# Patient Record
Sex: Male | Born: 1950
Health system: Southern US, Community
[De-identification: ages and names within clinical notes are randomized; demographics above are authoritative.]

## PROBLEM LIST (undated history)

## (undated) DIAGNOSIS — N529 Male erectile dysfunction, unspecified: Secondary | ICD-10-CM

## (undated) DIAGNOSIS — E119 Type 2 diabetes mellitus without complications: Secondary | ICD-10-CM

## (undated) DIAGNOSIS — K8020 Calculus of gallbladder without cholecystitis without obstruction: Secondary | ICD-10-CM

## (undated) DIAGNOSIS — I10 Essential (primary) hypertension: Secondary | ICD-10-CM

## (undated) DIAGNOSIS — K759 Inflammatory liver disease, unspecified: Secondary | ICD-10-CM

## (undated) DIAGNOSIS — E785 Hyperlipidemia, unspecified: Secondary | ICD-10-CM

## (undated) DIAGNOSIS — Z8719 Personal history of other diseases of the digestive system: Secondary | ICD-10-CM

## (undated) DIAGNOSIS — M4800 Spinal stenosis, site unspecified: Secondary | ICD-10-CM

## (undated) HISTORY — DX: Essential (primary) hypertension: I10

## (undated) HISTORY — DX: Type 2 diabetes mellitus without complications: E11.9

## (undated) HISTORY — DX: Male erectile dysfunction, unspecified: N52.9

## (undated) HISTORY — DX: Calculus of gallbladder without cholecystitis without obstruction: K80.20

## (undated) HISTORY — DX: Hyperlipidemia, unspecified: E78.5

---

## 1988-08-19 HISTORY — PX: OTHER SURGICAL HISTORY: SHX169

## 1998-11-04 ENCOUNTER — Emergency Department (HOSPITAL_COMMUNITY): Admission: EM | Admit: 1998-11-04 | Discharge: 1998-11-04 | Payer: Self-pay | Admitting: Emergency Medicine

## 1999-12-06 ENCOUNTER — Emergency Department (HOSPITAL_COMMUNITY): Admission: EM | Admit: 1999-12-06 | Discharge: 1999-12-06 | Payer: Self-pay | Admitting: Emergency Medicine

## 1999-12-06 ENCOUNTER — Encounter: Payer: Self-pay | Admitting: Emergency Medicine

## 2001-10-22 ENCOUNTER — Emergency Department (HOSPITAL_COMMUNITY): Admission: EM | Admit: 2001-10-22 | Discharge: 2001-10-22 | Payer: Self-pay | Admitting: Emergency Medicine

## 2002-02-24 ENCOUNTER — Emergency Department (HOSPITAL_COMMUNITY): Admission: EM | Admit: 2002-02-24 | Discharge: 2002-02-24 | Payer: Self-pay

## 2002-03-02 ENCOUNTER — Encounter: Admission: RE | Admit: 2002-03-02 | Discharge: 2002-03-02 | Payer: Self-pay | Admitting: Internal Medicine

## 2003-04-14 ENCOUNTER — Emergency Department (HOSPITAL_COMMUNITY): Admission: EM | Admit: 2003-04-14 | Discharge: 2003-04-14 | Payer: Self-pay | Admitting: Emergency Medicine

## 2003-04-24 ENCOUNTER — Emergency Department (HOSPITAL_COMMUNITY): Admission: EM | Admit: 2003-04-24 | Discharge: 2003-04-24 | Payer: Self-pay | Admitting: Emergency Medicine

## 2011-10-30 ENCOUNTER — Ambulatory Visit (INDEPENDENT_AMBULATORY_CARE_PROVIDER_SITE_OTHER): Payer: Self-pay | Admitting: Internal Medicine

## 2011-10-30 ENCOUNTER — Encounter: Payer: Self-pay | Admitting: Internal Medicine

## 2011-10-30 VITALS — BP 142/84 | HR 80 | Temp 97.3°F | Ht 72.0 in | Wt 232.3 lb

## 2011-10-30 DIAGNOSIS — E1149 Type 2 diabetes mellitus with other diabetic neurological complication: Secondary | ICD-10-CM

## 2011-10-30 DIAGNOSIS — E109 Type 1 diabetes mellitus without complications: Secondary | ICD-10-CM | POA: Insufficient documentation

## 2011-10-30 DIAGNOSIS — N529 Male erectile dysfunction, unspecified: Secondary | ICD-10-CM

## 2011-10-30 DIAGNOSIS — F1911 Other psychoactive substance abuse, in remission: Secondary | ICD-10-CM

## 2011-10-30 DIAGNOSIS — E1142 Type 2 diabetes mellitus with diabetic polyneuropathy: Secondary | ICD-10-CM | POA: Insufficient documentation

## 2011-10-30 DIAGNOSIS — F1111 Opioid abuse, in remission: Secondary | ICD-10-CM | POA: Insufficient documentation

## 2011-10-30 DIAGNOSIS — E785 Hyperlipidemia, unspecified: Secondary | ICD-10-CM | POA: Insufficient documentation

## 2011-10-30 DIAGNOSIS — E1140 Type 2 diabetes mellitus with diabetic neuropathy, unspecified: Secondary | ICD-10-CM

## 2011-10-30 DIAGNOSIS — I10 Essential (primary) hypertension: Secondary | ICD-10-CM | POA: Insufficient documentation

## 2011-10-30 LAB — LIPID PANEL
Cholesterol: 206 mg/dL — ABNORMAL HIGH (ref 0–200)
HDL: 56 mg/dL (ref >39)
LDL Cholesterol: 135 mg/dL — ABNORMAL HIGH (ref 0–99)
Total CHOL/HDL Ratio: 3.7 Ratio
Triglycerides: 75 mg/dL (ref <150)
VLDL: 15 mg/dL (ref 0–40)

## 2011-10-30 LAB — COMPREHENSIVE METABOLIC PANEL
ALT: 19 U/L (ref 0–53)
AST: 28 U/L (ref 0–37)
Albumin: 4.4 g/dL (ref 3.5–5.2)
Alkaline Phosphatase: 80 U/L (ref 39–117)
BUN: 16 mg/dL (ref 6–23)
CO2: 25 mEq/L (ref 19–32)
Calcium: 10.2 mg/dL (ref 8.4–10.5)
Chloride: 103 mEq/L (ref 96–112)
Creat: 0.96 mg/dL (ref 0.50–1.35)
Glucose, Bld: 72 mg/dL (ref 70–99)
Potassium: 4.6 mEq/L (ref 3.5–5.3)
Sodium: 141 mEq/L (ref 135–145)
Total Bilirubin: 0.6 mg/dL (ref 0.3–1.2)
Total Protein: 8.1 g/dL (ref 6.0–8.3)

## 2011-10-30 MED ORDER — AMITRIPTYLINE HCL 25 MG PO TABS: 25.0000 mg | ORAL_TABLET | Freq: Every day | ORAL | Status: DC

## 2011-10-30 MED ORDER — INSULIN ASPART PROT & ASPART (70-30 MIX) 100 UNIT/ML ~~LOC~~ SUSP: 42.0000 [IU] | Freq: Two times a day (BID) | SUBCUTANEOUS | Status: DC

## 2011-10-30 MED ORDER — PRAVASTATIN SODIUM 40 MG PO TABS: 40.0000 mg | ORAL_TABLET | Freq: Every evening | ORAL | Status: DC

## 2011-10-30 MED ORDER — ENALAPRIL MALEATE 2.5 MG PO TABS: 2.5000 mg | ORAL_TABLET | Freq: Every day | ORAL | Status: DC

## 2011-10-30 MED ORDER — SILDENAFIL CITRATE 100 MG PO TABS: 100.0000 mg | ORAL_TABLET | ORAL | Status: DC | PRN

## 2011-10-30 NOTE — Patient Instructions (Addendum)
your prescriptions has been sent to your pharmacy [facility] on [address]   Diabetes and Foot Care Diabetes may cause you to have a poor blood supply (circulation) to your legs and feet. Because of this, the skin may be thinner, break easier, and heal more slowly. You also may have nerve damage in your legs and feet causing decreased feeling. You may not notice minor injuries to your feet that could lead to serious problems or infections. Taking care of your feet is one of the most important things you can do for yourself.  HOME CARE INSTRUCTIONS  Do not go barefoot. Bare feet are easily injured.   Check your feet daily for blisters, cuts, and redness.   Wash your feet with warm water (not hot) and mild soap. Pat your feet and between your toes until completely dry.   Apply a moisturizing lotion that does not contain alcohol or petroleum jelly to the dry skin on your feet and to dry brittle toenails. Do not put it between your toes.   Trim your toenails straight across. Do not dig under them or around the cuticle.   Do not cut corns or calluses, or try to remove them with medicine.   Wear clean cotton socks or stockings every day. Make sure they are not too tight. Do not wear knee high stockings since they may decrease blood flow to your legs.   Wear leather shoes that fit properly and have enough cushioning. To break in new shoes, wear them just a few hours a day to avoid injuring your feet.   Wear shoes at all times, even in the house.   Do not cross your legs. This may decrease the blood flow to your feet.   If you find a minor scrape, cut, or break in the skin on your feet, keep it and the skin around it clean and dry. These areas may be cleansed with mild soap and water. Do not use peroxide, alcohol, iodine or Merthiolate.   When you remove an adhesive bandage, be sure not to harm the skin around it.   If you have a wound, look at it several times a day to make sure it is healing.     Do not use heating pads or hot water bottles. Burns can occur. If you have lost feeling in your feet or legs, you may not know it is happening until it is too late.   Report any cuts, sores or bruises to your caregiver. Do not wait!  SEEK MEDICAL CARE IF:   You have an injury that is not healing or you notice redness, numbness, burning, or tingling.   Your feet always feel cold.   You have pain or cramps in your legs and feet.  SEEK IMMEDIATE MEDICAL CARE IF:   There is increasing redness, swelling, or increasing pain in the wound.   There is a red line that goes up your leg.   Pus is coming from a wound.   You develop an unexplained oral temperature above 102 F (38.9 C), or as your caregiver suggests.   You notice a bad smell coming from an ulcer or wound.  MAKE SURE YOU:   Understand these instructions.   Will watch your condition.   Will get help right away if you are not doing well or get worse.  Document Released: 08/02/2000 Document Revised: 07/25/2011 Document Reviewed: 02/08/2009 [facility] Patient Information 2012 [place], [place].

## 2011-10-30 NOTE — Assessment & Plan Note (Signed)
Give the patient trial of Viagra

## 2011-10-30 NOTE — Assessment & Plan Note (Signed)
Check fasting lipid today, and given a prescription for pravastatin 40 mg.

## 2011-10-30 NOTE — Assessment & Plan Note (Signed)
Prescribe amitriptyline for diabetic neuropathy

## 2011-10-30 NOTE — Assessment & Plan Note (Signed)
Within reasonable control, continue enalapril 2.5 mg daily

## 2011-10-30 NOTE — Progress Notes (Signed)
Subjective:     Patient ID: Timothy Peters, male   DOB: August 31, 1950, 60 y.o.   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 Patient has been clean since that time and reports that he's changed his life since, patient currently denies any alcohol drugs or tobacco use. Reports compliance with his medications however he's running short like refills. Reports erectile dysfunction since release from prison and would like treatment for that. Denies any other complaints today.   Review of Systems  All other systems reviewed and are negative.       Objective:   Physical Exam  Nursing note and vitals reviewed. Constitutional: He is oriented to person, place, and time. He appears well-developed and well-nourished.  HENT:  Head: Normocephalic and atraumatic.  Eyes: Pupils are equal, round, and reactive to light.  Neck: Normal range of motion. No JVD present. No thyromegaly present.  Cardiovascular: Normal rate, regular rhythm and normal heart sounds.   Pulmonary/Chest: Effort normal and breath sounds normal. He has no wheezes. He has no rales.  Abdominal: Soft. Bowel sounds are normal. There is no tenderness. There is no rebound.  Musculoskeletal: Normal range of motion. He exhibits no edema.  Neurological: He is alert and oriented to person, place, and time.  Skin: Skin is warm and dry.    Patient Active Problem List  Diagnoses  . Diabetes mellitus type 1  . HTN (hypertension)  . Hyperlipidemia  . Erectile dysfunction  . Diabetic neuropathy    Insulin 70/30 42 units BID Enalapril 2.5mg  daily pravastatin 40mg  daily  Allergies no known allergies

## 2011-10-30 NOTE — Assessment & Plan Note (Signed)
Check A1c, fasting lipid, urine micro-and creatinine, foot exam, and given a prescription for insulin 7030 at 42 units twice daily, and make further adjustments in next visit

## 2011-10-31 LAB — CBC
HCT: 45.2 % (ref 39.0–52.0)
Hemoglobin: 14.8 g/dL (ref 13.0–17.0)
MCH: 29.4 pg (ref 26.0–34.0)
MCHC: 32.7 g/dL (ref 30.0–36.0)
MCV: 89.9 fL (ref 78.0–100.0)
Platelets: 311 10*3/uL (ref 150–400)
RBC: 5.03 MIL/uL (ref 4.22–5.81)
RDW: 12 % (ref 11.5–15.5)
WBC: 10.2 10*3/uL (ref 4.0–10.5)

## 2011-10-31 LAB — MICROALBUMIN / CREATININE URINE RATIO
Creatinine, Urine: 194.7 mg/dL
Microalb Creat Ratio: 7.8 mg/g (ref 0.0–30.0)
Microalb, Ur: 1.51 mg/dL (ref 0.00–1.89)

## 2012-03-04 ENCOUNTER — Ambulatory Visit (INDEPENDENT_AMBULATORY_CARE_PROVIDER_SITE_OTHER): Payer: Self-pay | Admitting: Internal Medicine

## 2012-03-04 ENCOUNTER — Encounter: Payer: Self-pay | Admitting: Internal Medicine

## 2012-03-04 VITALS — BP 137/83 | HR 72 | Temp 97.6°F | Wt 225.0 lb

## 2012-03-04 DIAGNOSIS — I10 Essential (primary) hypertension: Secondary | ICD-10-CM

## 2012-03-04 DIAGNOSIS — E1165 Type 2 diabetes mellitus with hyperglycemia: Secondary | ICD-10-CM | POA: Insufficient documentation

## 2012-03-04 DIAGNOSIS — Z0000 Encounter for general adult medical examination without abnormal findings: Secondary | ICD-10-CM

## 2012-03-04 DIAGNOSIS — E1140 Type 2 diabetes mellitus with diabetic neuropathy, unspecified: Secondary | ICD-10-CM

## 2012-03-04 DIAGNOSIS — E1142 Type 2 diabetes mellitus with diabetic polyneuropathy: Secondary | ICD-10-CM

## 2012-03-04 DIAGNOSIS — E785 Hyperlipidemia, unspecified: Secondary | ICD-10-CM

## 2012-03-04 DIAGNOSIS — E119 Type 2 diabetes mellitus without complications: Secondary | ICD-10-CM

## 2012-03-04 DIAGNOSIS — E1149 Type 2 diabetes mellitus with other diabetic neurological complication: Secondary | ICD-10-CM

## 2012-03-04 MED ORDER — PRAVASTATIN SODIUM 40 MG PO TABS: 40.0000 mg | ORAL_TABLET | Freq: Every evening | ORAL | Status: DC

## 2012-03-04 MED ORDER — ENALAPRIL MALEATE 2.5 MG PO TABS: 2.5000 mg | ORAL_TABLET | Freq: Every day | ORAL | Status: DC

## 2012-03-04 NOTE — Assessment & Plan Note (Signed)
Patient last LDL was 135 at 10/30/11. His LDL goal should be less than 70. Will restart his pravastatin today.

## 2012-03-04 NOTE — Assessment & Plan Note (Signed)
Patient would like to postpone colonoscopy. He would like to consider it after he gets his insurance, possibly by the end of this year.

## 2012-03-04 NOTE — Progress Notes (Signed)
Patient ID: Timothy Peters, male   DOB: [birth year], 61 y.o.   MRN: [id number]  Subjective:   Patient ID: Timothy Peters male   DOB: Mar 02, 1951 61 y.o.   MRN: [id number]  HPI: [name] is a 61 y.o. with a past medical history as outlined below, who presents for a followup visit.  Regarding his diabetes, patient is currently taking NovoLog mix 70/30, 42 units twice a day. He did not have symptoms for hypoglycemia. He also does not have symptoms for hypoglycemia. We do not have his  A1c record in the past. He refused to have the A1c test today due to lack of insurance.   Regarding his hypertension, patient is currently taking enalapril, 2.5 mg daily. Today his blood pressure is 137/83.  Regarding his hyperlipidemia, he used to take pravastatin, 40 mg daily. He did not refill his medication in past 3 months. His previous LDL was 135 at 10/30/11. His AST and ALT were normal at 10/30/11.  Regarding his diabetic neuropathy, he reports that this problem resolved. He does not have any tingling sensation or pain in his legs.   No past medical history on file. Current Outpatient Prescriptions  Medication Sig Dispense Refill  . enalapril (VASOTEC) 2.5 MG tablet Take 1 tablet (2.5 mg total) by mouth daily.  30 tablet  11  . insulin aspart protamine-insulin aspart (NOVOLOG MIX 70/30) (70-30) 100 UNIT/ML injection Inject 42 Units into the skin 2 (two) times daily with a meal.  10 mL  12  . DISCONTD: enalapril (VASOTEC) 2.5 MG tablet Take 1 tablet (2.5 mg total) by mouth daily.  30 tablet  11  . pravastatin (PRAVACHOL) 40 MG tablet Take 1 tablet (40 mg total) by mouth every evening.  30 tablet  11  . sildenafil (VIAGRA) 100 MG tablet Take 1 tablet (100 mg total) by mouth as needed for erectile dysfunction.  4 tablet  2  . DISCONTD: pravastatin (PRAVACHOL) 40 MG tablet Take 1 tablet (40 mg total) by mouth every evening.  30 tablet  11   No family history on file. History   Social History  .  Marital Status: Single    Spouse Name: N/A    Number of Children: N/A  . Years of Education: N/A   Social History Main Topics  . Smoking status: Former Smoker    Quit date: 11/30/2007  . Smokeless tobacco: Not on file  . Alcohol Use: Not on file  . Drug Use: Not on file  . Sexually Active: Not on file   Other Topics Concern  . Not on file   Social History Narrative  . No narrative on file   Review of Systems: General: no fevers, chills, no changes in body weight, no changes in appetite Skin: no rash HEENT: no blurry vision, hearing changes or sore throat Pulm: no dyspnea, coughing, wheezing CV: no chest pain, palpitations, shortness of breath Abd: no nausea/vomiting, abdominal pain, diarrhea/constipation GU: no dysuria, hematuria, polyuria Ext: no arthralgias, myalgias Neuro: no weakness, numbness, or tingling   Objective:  Physical Exam:  Bp 137; HR 72; T=97.6; BW=225 LBs  General: resting in bed, not in acute distress HEENT: PERRL, EOMI, no scleral icterus Cardiac: S1/S2, RRR, No murmurs, gallops or rubs Pulm: Good air movement bilaterally, Clear to auscultation bilaterally, No rales, wheezing, rhonchi or rubs. Abd: Soft,  nondistended, nontender, no rebound pain, no organomegaly, BS present Ext: No rashes or edema, 2+DP/PT pulse bilaterally Musculoskeletal: No joint deformities, erythema, or stiffness,  ROM full and nontender Skin: no rashes. No skin bruise. Neuro: alert and oriented X3, cranial nerves II-XII grossly intact, muscle strength 5/5 in all extremeties,  sensation to light touch intact.  Psych.: patient is not psychotic, no suicidal or hemocidal ideation.   Assessment & Plan:

## 2012-03-04 NOTE — Assessment & Plan Note (Signed)
Currently patient doesn't have symptoms. We'll followup

## 2012-03-04 NOTE — Assessment & Plan Note (Signed)
Patient reports that he was diagnosed as diabetes at age of 83. It is most likely that the patient has type 2 diabetes. His previous diagnosis of type 1 diabetes might be wrong. Currently patient is taking NovoLog mix 70/30, 42 units twice a day. Because of financial difficulty, patient refused to do the A1c test today. Patient already talked with our financial aid, Rudell Cobb before. Patient is not qualified to get orange card because his income is higher than the minimal requirements. Patient reports that his wife is working in [facility]. He might be able to get his coverage at December, 2013. Dr. Meredith Pel spent some times to explain the importance of getting A1c test in order to adjust his insulin dosage. Patient was still not ready to do the A1c test today.  - continue his current regimen and follow up. - Patient was advised to call us whenever he feels ready to do the A1c test.

## 2012-03-04 NOTE — Assessment & Plan Note (Signed)
Blood pressure is well controlled. He is currently taking enalapril 2.5 mg daily. His blood pressure is 137/83 today. We'll continue current regimen

## 2012-03-04 NOTE — Patient Instructions (Signed)
1. Please start taking pravastatin from now for your high blood cholesterol.  2. Please take all medications as prescribed.  3. If you have worsening of your symptoms or new symptoms arise, please call the clinic ([phone]), or go to the ER immediately if symptoms are severe.

## 2012-07-27 ENCOUNTER — Ambulatory Visit (INDEPENDENT_AMBULATORY_CARE_PROVIDER_SITE_OTHER): Payer: Self-pay | Admitting: Radiation Oncology

## 2012-07-27 ENCOUNTER — Encounter: Payer: Self-pay | Admitting: Radiation Oncology

## 2012-07-27 VITALS — BP 125/71 | HR 67 | Temp 97.1°F | Ht 72.0 in | Wt 224.1 lb

## 2012-07-27 DIAGNOSIS — Z0000 Encounter for general adult medical examination without abnormal findings: Secondary | ICD-10-CM

## 2012-07-27 DIAGNOSIS — Z23 Encounter for immunization: Secondary | ICD-10-CM

## 2012-07-27 DIAGNOSIS — I10 Essential (primary) hypertension: Secondary | ICD-10-CM

## 2012-07-27 DIAGNOSIS — E119 Type 2 diabetes mellitus without complications: Secondary | ICD-10-CM

## 2012-07-27 DIAGNOSIS — E785 Hyperlipidemia, unspecified: Secondary | ICD-10-CM

## 2012-07-27 LAB — GLUCOSE, CAPILLARY: Glucose-Capillary: 304 mg/dL — ABNORMAL HIGH (ref 70–99)

## 2012-07-27 LAB — POCT GLYCOSYLATED HEMOGLOBIN (HGB A1C): Hemoglobin A1C: 8.5

## 2012-07-27 MED ORDER — METFORMIN HCL 500 MG PO TABS: 1000.0000 mg | ORAL_TABLET | Freq: Two times a day (BID) | ORAL | Status: DC

## 2012-07-27 NOTE — Progress Notes (Signed)
  Subjective:    Patient ID: Timothy Peters, male    DOB: 05/09/1951, 61 y.o.   MRN: [id number]  HPI Patient is a 61 year old man with past medical history type 2 diabetes mellitus, hypertension, hyperlipidemia who presents to clinic today for routine visit. Regarding the patient's diabetes, patient states that his blood sugars have been running around 250-300, but admits that he checks his blood sugar less than once per day. Patient also admits that he has been using his 7030 insulin only once per day in the morning rather than twice a day as prescribed. Patient states that he has only been taking his metformin 1000 mg once per day rather than twice a day as prescribed also, he states this is because he has been running low on this prescription. Patient states she's been otherwise taking his medications as prescribed, and denies any complaints today. Patient states that as of 1.1.2014 he will have insurance through his wife's [employer] insurance policy, but at this time has absolutely no insurance coverage and does not qualify for the orange card.   Review of Systems  Constitutional: Negative for fever and chills.  HENT: Negative.   Respiratory: Negative for cough, shortness of breath and wheezing.   Cardiovascular: Negative for chest pain and leg swelling.  Gastrointestinal: Negative for nausea, vomiting, abdominal pain, diarrhea and blood in stool.  Genitourinary: Negative.  Negative for dysuria and hematuria.  Musculoskeletal: Negative.   Skin: Negative.   Neurological: Negative.   Hematological: Negative.   Psychiatric/Behavioral: Negative.        Objective:   Physical Exam  Constitutional: He is oriented to person, place, and time. He appears well-developed and well-nourished. No distress.  HENT:  Head: Normocephalic and atraumatic.  Eyes: Pupils are equal, round, and reactive to light. No scleral icterus.  Neck: Normal range of motion. Neck supple. No tracheal deviation present. No  thyromegaly present.  Cardiovascular: Normal rate and regular rhythm.   No murmur heard. Pulmonary/Chest: Effort normal. He has no wheezes. He has no rales.  Abdominal: Soft. Bowel sounds are normal. He exhibits no distension. There is no tenderness.  Musculoskeletal: Normal range of motion. He exhibits no edema.  Neurological: He is alert and oriented to person, place, and time. No cranial nerve deficit.  Skin: Skin is warm and dry. No erythema.  Psychiatric: He has a normal mood and affect. His behavior is normal.          Assessment & Plan:

## 2012-07-27 NOTE — Assessment & Plan Note (Signed)
Patient given flu vaccine. Patient will need to be referred to gastroenterology for screening colonoscopy after he receives insurance on 1.1.2014.

## 2012-07-27 NOTE — Assessment & Plan Note (Addendum)
A1c=8.5. Patient has been taking 42 units of 7030 insulin once per day in the morning. He states that he has not been taking the evening dose of 42 units due to cost. After patient is able to obtain insurance on 1.1.2014, it will likely be appropriate to switch him to a longer acting insulin at that time, preferably Lantus once per day. Patient was instructed to check his blood sugars 4 times per day in the meantime, and to bring his glucometer to his next visit in one month so that glycemic control could be better assessed. -Change patient from 7030 to Lantus at next visit (assuming patient is able to get insurance) -Refill patient's prescription of metformin 1000 mg twice a day -Followup in one month

## 2012-07-27 NOTE — Patient Instructions (Addendum)
General Instructions: Continue to take your medications as prescribed. We will see you back in clinic in one month to address your Diabetes and your high cholesterol.    Treatment Goals:  Goals (1 Years of Data) as of 07/27/2012          As of Today 03/04/12 10/30/11     Blood Pressure    . Blood Pressure < 140/90  125/71 137/83 142/84     Result Component    . HEMOGLOBIN A1C < 7.0  8.5      . LDL CALC < 100    135      Progress Toward Treatment Goals:  Treatment Goal 07/27/2012  Hemoglobin A1C unable to assess  Blood pressure at goal    Self Care Goals & Plans:  Self Care Goal 07/27/2012  Manage my medications bring my medications to every visit  Monitor my health bring my glucose meter and log to each visit; keep track of my blood glucose  Eat healthy foods eat fruit for snacks and desserts    Home Blood Glucose Monitoring 07/27/2012  Check my blood sugar 4 times a day  When to check my blood sugar before meals; at bedtime     Care Management & Community Referrals:

## 2012-07-27 NOTE — Assessment & Plan Note (Signed)
Well-controlled on current regimen. No changes necessary today.  BP Readings from Last 3 Encounters:  07/27/12 125/71  03/04/12 137/83  10/30/11 142/84   BP Readings from Last 3 Encounters:  07/27/12 125/71  03/04/12 137/83  10/30/11 142/84    Lab Results  Component Value Date   NA 141 10/30/2011   K 4.6 10/30/2011   CREATININE 0.96 10/30/2011    Assessment:  Blood pressure control: controlled  Progress toward BP goal:  at goal  Comments:   Plan:  Medications:  continue current medications  Educational resources provided: brochure  Self management tools provided:    Other plans:

## 2012-07-27 NOTE — Assessment & Plan Note (Signed)
Lab Results  Component Value Date   LDLCALC 135* 10/30/2011   Patient's LDL last checked in 10/2011, at which time it was 135. Goal for this patient is less than 100. Patient was recently started on pravastatin and 02/2012, and as this is the highest potency and dose of statin the patient can afford without insurance, and there is no utility in rechecking an LDL at today's visit (particularly as patient has already eaten breakfast this a.m.). -Recheck lipid panel followup visit in one month -Start patient on a more potent statin at that time if LDL is greater than 100

## 2012-07-28 NOTE — Progress Notes (Signed)
I discussed the plan of care for this patient and I agree with the note. Thanks,  [place], Brelynn Wheller

## 2012-07-29 NOTE — Addendum Note (Signed)
Addended by: Elenor Legato R on: 07/29/2012 11:29 AM   Modules accepted: Orders

## 2012-09-01 ENCOUNTER — Encounter: Payer: Self-pay | Admitting: Radiation Oncology

## 2012-09-01 ENCOUNTER — Ambulatory Visit (INDEPENDENT_AMBULATORY_CARE_PROVIDER_SITE_OTHER): Payer: 59 | Admitting: Radiation Oncology

## 2012-09-01 VITALS — BP 118/78 | HR 71 | Temp 98.2°F | Ht 72.0 in | Wt 233.5 lb

## 2012-09-01 DIAGNOSIS — Z0000 Encounter for general adult medical examination without abnormal findings: Secondary | ICD-10-CM

## 2012-09-01 DIAGNOSIS — I10 Essential (primary) hypertension: Secondary | ICD-10-CM

## 2012-09-01 DIAGNOSIS — N529 Male erectile dysfunction, unspecified: Secondary | ICD-10-CM

## 2012-09-01 DIAGNOSIS — E785 Hyperlipidemia, unspecified: Secondary | ICD-10-CM

## 2012-09-01 DIAGNOSIS — E119 Type 2 diabetes mellitus without complications: Secondary | ICD-10-CM

## 2012-09-01 LAB — LIPID PANEL
Cholesterol: 188 mg/dL (ref 0–200)
HDL: 43 mg/dL (ref >39)
LDL Cholesterol: 118 mg/dL — ABNORMAL HIGH (ref 0–99)
Total CHOL/HDL Ratio: 4.4 Ratio
Triglycerides: 135 mg/dL (ref <150)
VLDL: 27 mg/dL (ref 0–40)

## 2012-09-01 LAB — GLUCOSE, CAPILLARY: Glucose-Capillary: 214 mg/dL — ABNORMAL HIGH (ref 70–99)

## 2012-09-01 MED ORDER — METFORMIN HCL 500 MG PO TABS: 1000.0000 mg | ORAL_TABLET | Freq: Two times a day (BID) | ORAL | Status: DC

## 2012-09-01 MED ORDER — PRAVASTATIN SODIUM 40 MG PO TABS: 40.0000 mg | ORAL_TABLET | Freq: Every evening | ORAL | Status: DC

## 2012-09-01 MED ORDER — ENALAPRIL MALEATE 2.5 MG PO TABS: 2.5000 mg | ORAL_TABLET | Freq: Every day | ORAL | Status: DC

## 2012-09-01 MED ORDER — GLUCOSE BLOOD VI STRP: ORAL_STRIP | Status: DC

## 2012-09-01 MED ORDER — INSULIN GLARGINE 100 UNIT/ML ~~LOC~~ SOLN: 30.0000 [IU] | Freq: Every day | SUBCUTANEOUS | Status: DC

## 2012-09-01 MED ORDER — SILDENAFIL CITRATE 100 MG PO TABS: 100.0000 mg | ORAL_TABLET | ORAL | Status: DC | PRN

## 2012-09-01 NOTE — Patient Instructions (Addendum)
General instructions: Please start using lantus in place of your 70/30 insulin, as we discussed during your visit. We will see you back in approximately one month to see how your diabetes is doing after switching to lantus. Please continue all of your other medications. Please make sure to have the pharmacist demonstrate how to use the lantus pen when you pick up your prescription.   Insulin Glargine injection What is this medicine? INSULIN GLARGINE (IN su lin GLAR geen) is a human-made form of insulin. This drug lowers the amount of sugar in your blood. It is a long-acting insulin that is usually given once a day. This medicine may be used for other purposes; ask your health care provider or pharmacist if you have questions. What should I tell my health care provider before I take this medicine? They need to know if you have any of these conditions: -episodes of hypoglycemia -kidney disease -liver disease -an unusual or allergic reaction to insulin, metacresol, other medicines, foods, dyes, or preservatives -pregnant or trying to get pregnant -breast-feeding How should I use this medicine? This medicine is for injection under the skin. Use exactly as directed. It is important to follow the directions given to you by your health care professional or doctor. You will be taught how to use this medicine and how to adjust doses for activities and illness. You may take this medicine at any time of the day but you must take it at the same time everyday. Do not use more insulin than prescribed. Do not use more or less often than prescribed. Always check the appearance of your insulin before using it. This medicine should be clear and colorless like water. Do not use it if it is cloudy, thickened, colored, or has solid particles in it. Do not mix this medicine with any other insulin or diluent. It is important that you put your used needles and syringes in a special sharps container. Do not put them in a  trash can. If you do not have a sharps container, call your pharmacist or healthcare provider to get one. Talk to your pediatrician regarding the use of this medicine in children. Special care may be needed. Overdosage: If you think you have taken too much of this medicine contact a poison control center or emergency room at once. NOTE: This medicine is only for you. Do not share this medicine with others. What if I miss a dose? It is important not to miss a dose. Your health care professional or doctor should discuss a plan for missed doses with you. If you do miss a dose, follow their plan. Do not take double doses. What may interact with this medicine? -other medicines for diabetes Many medications may cause an increase or decrease in blood sugar, these include: -alcohol containing beverages -aspirin and aspirin-like drugs -chloramphenicol -chromium -diuretics -male hormones, like estrogens or progestins and birth control pills -heart medicines -isoniazid -male hormones or anabolic steroids -medicines for weight loss -medicines for allergies, asthma, cold, or cough -medicines for mental problems -medicines called MAO Inhibitors like Nardil, Parnate, Marplan, Eldepryl -niacin -NSAIDs, medicines for pain and inflammation, like ibuprofen or naproxen -pentamidine -phenytoin -probenecid -quinolone antibiotics like ciprofloxacin, levofloxacin, ofloxacin -some herbal dietary supplements -steroid medicines like prednisone or cortisone -thyroid medicine Some medications can hide the warning symptoms of low blood sugar. You may need to monitor your blood sugar more closely if you are taking one of these medications. These include: -beta-blockers such as atenolol, metoprolol, propranolol -clonidine -guanethidine -  reserpine This list may not describe all possible interactions. Give your health care provider a list of all the medicines, herbs, non-prescription drugs, or dietary  supplements you use. Also tell them if you smoke, drink alcohol, or use illegal drugs. Some items may interact with your medicine. What should I watch for while using this medicine? Visit your health care professional or doctor for regular checks on your progress. To control your diabetes you must use this medicine regularly and follow a diet and exercise schedule. Checking and recording your blood sugar and urine ketone levels regularly is important. Use a blood sugar measuring device before you treat high or low blood sugar. Always carry a quick-source of sugar with you in case you have symptoms of low blood sugar. Examples include hard sugar candy or glucose tablets. Make sure family members know that you can choke if you eat or drink when you develop serious symptoms of low blood sugar, such as seizures or unconsciousness. They must get medical help at once. Make sure that you have the right kind of syringe for the type of insulin you use. Try not to change the brand and type of insulin or syringe unless your health care professional or doctor tells you to. Switching insulin brand or type can cause dangerously high or low blood sugar. Always keep an extra supply of insulin, syringes, and needles on hand. Use a syringe one time only. Throw away syringe and needle in a closed container to prevent accidental needle sticks. Insulin pens and cartridges should never be shared. Sharing may result in passing of viruses like hepatitis or HIV. Wear a medical identification bracelet or chain to say you have diabetes, and carry a card that lists all your medications. Many nonprescription cough and cold products contain sugar or alcohol. These can affect diabetes control or can alter the results of tests used to monitor blood sugar. Avoid alcohol. Avoid products that contain alcohol or sugar. What side effects may I notice from receiving this medicine? Side effects that you should report to your health care  professional or doctor as soon as possible: Symptoms of low blood sugar: -You may feel nervous, confused, dizzy, hungry, weak, sweaty, shaky, cold, and irritable. You may also experience headache, blurred vision, rapid heartbeat and loss of consciousness. Symptoms of high blood sugar: -You may experience dizziness, dry mouth, dry skin, fruity breath, loss of appetite, nausea, stomach ache, unusual thirst, frequent urination Insulin also can cause rare but serious allergic reactions in some patients, including: -bad skin rash and itching -breathing problems Side effects that usually do not require medical attention (report to your health care professional or doctor if they continue or are bothersome): -increase or decrease in fatty tissue under the skin, through overuse of a particular injection -itching, burning, swelling, or rash at the injection site This list may not describe all possible side effects. Call your doctor for medical advice about side effects. You may report side effects to FDA at 1-800-FDA-1088. Where should I keep my medicine? Keep out of the reach of children. Store unopened vials in a refrigerator between 2 and 8 degrees C (36 and 46 degrees F). Do not freeze or use if the insulin has been frozen. Opened vials (vials currently in use) may be stored in the refrigerator or at room temperature, at approximately 25 degrees C (77 degrees F) or cooler. Keeping your insulin at room temperature decreases the amount of pain during injection. Once opened, your insulin can be used for  28 days. After 28 days, the vial should be thrown away. Store unopened pen-injector cartridges in a refrigerator between 2 and 8 degrees C (36 and 46 degrees F.) Do not freeze or use if the insulin has been frozen. Insulin cartridges inserted into the [employer] system should be kept at room temperature, approximately 25 degrees C (77 degrees F) or cooler. Do not store in the refrigerator. Once inserted into the  [employer] system, the insulin can be used for 28 days. After 28 days, the cartridge should be thrown away. Protect from light and excessive heat. Throw away any unused medicine after the expiration date or after the specified time for room temperature storage has passed. NOTE: This sheet is a summary. It may not cover all possible information. If you have questions about this medicine, talk to your doctor, pharmacist, or health care provider.  2013, [employer]. (11/06/2007 11:47:33 AM)

## 2012-09-02 ENCOUNTER — Other Ambulatory Visit: Payer: Self-pay | Admitting: *Deleted

## 2012-09-02 MED ORDER — INSULIN PEN NEEDLE 31G X 8 MM MISC: Status: DC

## 2012-09-02 NOTE — Assessment & Plan Note (Addendum)
Patient states he has not been checking his CBGs at home, as he has run out of test strips and could not afford them previously without insurance, so it is unclear if he has had any hypoglycemic episodes (although he has no complaints of hypoglycemic symptoms). Due to concern for precipitating hypoglycemia, on switching from 70/30 to lantus, he will be prescribed ~75% of the dose of 70/30 he has actually been compliant with recently (42U in the AM, although instructed to inject bid).  - d/c 70/30 insulin - lantus 30U qhs - cont metformin 1000mg  bid - check CBGs 4x/day (given Rx for glucose test strips) - f/u in 1 month to assess effectiveness of new regimen

## 2012-09-02 NOTE — Assessment & Plan Note (Signed)
BP Readings from Last 3 Encounters:  09/01/12 118/78  07/27/12 125/71  03/04/12 137/83    Lab Results  Component Value Date   NA 141 10/30/2011   K 4.6 10/30/2011   CREATININE 0.96 10/30/2011    Assessment:  Blood pressure control: controlled  Progress toward BP goal:  at goal  Comments:   Plan:  Medications:  continue current medications  Educational resources provided:    Self management tools provided:    Other plans:

## 2012-09-02 NOTE — Progress Notes (Signed)
  Subjective:    Patient ID: Dahlia Bailiff, male    DOB: [birth year], 62 y.o.   MRN: [id number]  HPI Mr. Zeitler is a 62yo gentleman with PMH significant for type 2 diabetes mellitus who presents to clinic today for follow-up on insulin options for his T2DM. He has been taking 70/30 insulin, but on his previous visit he informed that he would be receiving  employee insurance as of 08/19/2012, and was thus told he could likely start a once/day insulin at that time. The patient indeed has now acquired the [facility] health insurance (his wife is an employee of [employer]) and is in agreement on having his 70/30 insulin switched to lantus today. With his 70/30 insulin, he has oftentimes not been fully compliant, both due to bid dosing and the cost, and recently states he has only been using it once per day (42U in the AM).   He has no new complaints today, but requests another prescription for viagra (for ED) to be sent to the [facility] outpatient pharmacy, as he hopes his insurance may now cover enough of the cost that he'll be able to afford the copay.   Review of Systems  All other systems reviewed and are negative.       Objective:   Physical Exam  Constitutional: He is oriented to person, place, and time. He appears well-developed and well-nourished. No distress.  HENT:  Head: Normocephalic and atraumatic.  Eyes: Pupils are equal, round, and reactive to light. No scleral icterus.  Neck: Normal range of motion. Neck supple. No tracheal deviation present.  Cardiovascular: Normal rate and regular rhythm.   No murmur heard. Pulmonary/Chest: Effort normal. He has no wheezes. He has no rales.  Abdominal: Soft. Bowel sounds are normal. He exhibits no distension. There is no tenderness.  Musculoskeletal: Normal range of motion. He exhibits no edema.  Neurological: He is alert and oriented to person, place, and time. No cranial nerve deficit.  Skin: Skin is warm and dry. No erythema.  Psychiatric: He has a  normal mood and affect. His behavior is normal.          Assessment & Plan:

## 2012-09-02 NOTE — Assessment & Plan Note (Signed)
LDL improved but still elevated. Will reassess compliance and consider escalating statin therapy at time of f/u in 1 month.  - continue pravastatin 40mg   Lab Results  Component Value Date   LDLCALC 118* 09/01/2012

## 2012-09-02 NOTE — Assessment & Plan Note (Signed)
Pt given another prescription for viagra today, which hopefully he'll be able to afford with his new insurance.

## 2012-09-26 ENCOUNTER — Ambulatory Visit: Payer: 59

## 2012-09-29 ENCOUNTER — Encounter: Payer: 59 | Attending: Radiation Oncology | Admitting: *Deleted

## 2012-09-29 VITALS — Ht 72.0 in | Wt 215.8 lb

## 2012-09-29 DIAGNOSIS — E119 Type 2 diabetes mellitus without complications: Secondary | ICD-10-CM | POA: Insufficient documentation

## 2012-09-29 DIAGNOSIS — Z713 Dietary counseling and surveillance: Secondary | ICD-10-CM | POA: Insufficient documentation

## 2012-10-04 ENCOUNTER — Encounter: Payer: Self-pay | Admitting: *Deleted

## 2012-10-04 NOTE — Progress Notes (Signed)
Patient was seen on 09/29/2012 for the first of a series of three diabetes self-management courses at [facility]. Patient's most recent A1c was 9.3 % on 09/14/2012 The following learning objectives were met by the patient during this course:   Defines the role of glucose and insulin  Identifies type of diabetes and pathophysiology  Defines the diagnostic criteria for diabetes and prediabetes  States the risk factors for Type 2 Diabetes  States the symptoms of Type 2 Diabetes  Defines Type 2 Diabetes treatment goals  Defines Type 2 Diabetes treatment options  States the rationale for glucose monitoring  Identifies A1C, glucose targets, and testing times  Identifies proper sharps disposal  Defines the purpose of a diabetes food plan  Identifies carbohydrate food groups  Defines effects of carbohydrate foods on glucose levels  Identifies carbohydrate choices/grams/food labels  States benefits of physical activity and effect on glucose  Review of suggested activity guidelines  Handouts given during class include:  Type 2 Diabetes: Basics Book  My Food Plan Book  Food and Activity Log   Follow-Up Plan: Core Class 2

## 2012-10-04 NOTE — Patient Instructions (Signed)
Goals:  Follow Diabetes Meal Plan as instructed  Eat 3 meals and 2 snacks, every 3-5 hrs  Limit carbohydrate intake to 45-60 grams carbohydrate/meal  Limit carbohydrate intake to 0-30 grams carbohydrate/snack  Add lean protein foods to meals/snacks  Monitor glucose levels as instructed by your doctor  Aim for 15-30 mins of physical activity daily  Bring food record and glucose log to your next nutrition visit   

## 2012-10-06 ENCOUNTER — Encounter: Payer: Self-pay | Admitting: Radiation Oncology

## 2012-10-06 ENCOUNTER — Ambulatory Visit (INDEPENDENT_AMBULATORY_CARE_PROVIDER_SITE_OTHER): Payer: 59 | Admitting: Radiation Oncology

## 2012-10-06 ENCOUNTER — Encounter: Payer: 59 | Admitting: *Deleted

## 2012-10-06 VITALS — BP 125/70 | HR 63 | Temp 97.6°F | Ht 72.0 in | Wt 213.7 lb

## 2012-10-06 DIAGNOSIS — E785 Hyperlipidemia, unspecified: Secondary | ICD-10-CM

## 2012-10-06 DIAGNOSIS — I10 Essential (primary) hypertension: Secondary | ICD-10-CM

## 2012-10-06 DIAGNOSIS — Z79899 Other long term (current) drug therapy: Secondary | ICD-10-CM

## 2012-10-06 DIAGNOSIS — Z1211 Encounter for screening for malignant neoplasm of colon: Secondary | ICD-10-CM

## 2012-10-06 DIAGNOSIS — Z23 Encounter for immunization: Secondary | ICD-10-CM

## 2012-10-06 DIAGNOSIS — Z0000 Encounter for general adult medical examination without abnormal findings: Secondary | ICD-10-CM

## 2012-10-06 DIAGNOSIS — N529 Male erectile dysfunction, unspecified: Secondary | ICD-10-CM

## 2012-10-06 DIAGNOSIS — E119 Type 2 diabetes mellitus without complications: Secondary | ICD-10-CM

## 2012-10-06 LAB — GLUCOSE, CAPILLARY: Glucose-Capillary: 155 mg/dL — ABNORMAL HIGH (ref 70–99)

## 2012-10-06 LAB — POCT GLYCOSYLATED HEMOGLOBIN (HGB A1C): Hemoglobin A1C: 7.9

## 2012-10-06 MED ORDER — TETANUS-DIPHTH-ACELL PERTUSSIS 5-2.5-18.5 LF-MCG/0.5 IM SUSP: 0.5000 mL | Freq: Once | INTRAMUSCULAR | Status: DC

## 2012-10-06 MED ORDER — PRAVASTATIN SODIUM 40 MG PO TABS: 40.0000 mg | ORAL_TABLET | Freq: Every evening | ORAL | Status: DC

## 2012-10-07 NOTE — Progress Notes (Signed)
  Patient was seen on 10/06/12 for the second of a series of three diabetes self-management courses at the [facility]. The following learning objectives were met by the patient during this course:   Explain basic nutrition maintenance and quality assurance  Describe causes, symptoms and treatment of hypoglycemia and hyperglycemia  Explain how to manage diabetes during illness  Describe the importance of good nutrition for health and healthy eating strategies  List strategies to follow meal plan when dining out  Describe the effects of alcohol on glucose and how to use it safely  Describe problem solving skills for day-to-day glucose challenges  Describe strategies to use when treatment plan needs to change  Identify important factors involved in successful weight loss  Describe ways to remain physically active  Describe the impact of regular activity on insulin resistance   Handouts given in class:  Refrigerator magnet for Sick Day Guidelines  [facility] Oral medication/insulin handout  Follow-Up Plan: Patient will attend the final class of the ADA Diabetes Self-Care Education.    

## 2012-10-07 NOTE — Patient Instructions (Signed)
Goals:  Follow Diabetes Meal Plan as instructed  Eat 3 meals and 2 snacks, every 3-5 hrs  Limit carbohydrate intake to 60 grams carbohydrate/meal  Limit carbohydrate intake to 30 grams carbohydrate/snack  Add lean protein foods to meals/snacks  Monitor glucose levels as instructed by your doctor  Aim for 30 mins of physical activity daily  Bring food record and glucose log to your next nutrition visit   

## 2012-10-07 NOTE — Progress Notes (Signed)
  Subjective:    Patient ID: Timothy Peters, male    DOB: 07/01/1951, 62 y.o.   MRN: [id number]  HPI [name] is a 62 yo man with PMH significant for type 2 diabetes mellitus who presents to clinic today for follow-up on his glycemic control after switching from 70/30 insulin to lantus (after obtaining insurance coverage). He states he has been fully compliant with his lantus, but admits to many dietary indiscretions, including many overnight meals which largely contain sweets. He states he has been checking his blood sugar somewhat regularly, which has been averaging in the high 100s. He denies any episodes of symptomatic or asymptomatic hypoglycemia. He has no new complaints today, and states he has been doing very well since his previous visit.   Review of Systems  All other systems reviewed and are negative.       Objective:   Physical Exam  Constitutional: He is oriented to person, place, and time. He appears well-developed and well-nourished. No distress.  HENT:  Head: Normocephalic and atraumatic.  Eyes: Conjunctivae are normal. Pupils are equal, round, and reactive to light. No scleral icterus.  Neck: Normal range of motion. Neck supple. No tracheal deviation present.  Cardiovascular: Normal rate and regular rhythm.   No murmur heard. Pulmonary/Chest: Effort normal. He has no wheezes. He has no rales.  Abdominal: Soft. Bowel sounds are normal. He exhibits no distension. There is no tenderness.  Musculoskeletal: Normal range of motion. He exhibits no edema.  Neurological: He is alert and oriented to person, place, and time. No cranial nerve deficit.  Skin: Skin is warm and dry. No erythema.  Psychiatric: He has a normal mood and affect. His behavior is normal.          Assessment & Plan:

## 2012-10-08 ENCOUNTER — Encounter: Payer: 59 | Admitting: *Deleted

## 2012-10-08 DIAGNOSIS — E119 Type 2 diabetes mellitus without complications: Secondary | ICD-10-CM

## 2012-10-08 NOTE — Patient Instructions (Signed)
Goals:  Follow Diabetes Meal Plan as instructed  Eat 3 meals and 2 snacks, every 3-5 hrs  Limit carbohydrate intake to 60 grams carbohydrate/meal  Limit carbohydrate intake to 30 grams carbohydrate/snack  Add lean protein foods to meals/snacks  Monitor glucose levels as instructed by your doctor  Aim for 30 mins of physical activity daily  Bring food record and glucose log to your next nutrition visit   

## 2012-10-08 NOTE — Assessment & Plan Note (Signed)
Lab Results  Component Value Date   HGBA1C 7.9 10/06/2012   HGBA1C 8.5 07/27/2012     Assessment:  Diabetes control: fair control  Progress toward A1C goal:  improved  Comments: A1c slightly improved after switching to lantus 30U qhs (from 70/30 insulin).  Plan:  Medications:  continue current medications  Home glucose monitoring:   Frequency:     Timing:    Instruction/counseling given: reminded to get eye exam  Educational resources provided:    Self management tools provided:  ths to recheck his A1c and assess the need for modification of his insulin regimen (to note, his current lantus dose is equivalent to only 75% of his previous 70/30 dose, however he had been highly noncompliant with that dose).     Other plans: patient's A1c is improved after only 1 month of his new insulin regimen. He is being seen by a dietician and also has [facility] services that are helping coordinatine his diabetic care, and he is striving to make significant diet and lifestyle changes (increased exercise). Suspect that his A1c should continue to improve as these changes are implemented. At today's visit the patient was referred for a diabetic eye exam. We will have him RTC in approximately 2 mon

## 2012-10-08 NOTE — Progress Notes (Signed)
  Patient was seen on 10/08/12 for the third of a series of three diabetes self-management courses at the [facility]. The following learning objectives were met by the patient during this course:    Describe how diabetes changes over time   Identify diabetes complications and ways to prevent them   Describe strategies that can promote heart health including lowering blood pressure and cholesterol   Describe strategies to lower dietary fat and sodium in the diet   Identify physical activities that benefit cardiovascular health   Evaluate success in meeting personal goal   Describe the belief that they can live successfully with diabetes day to day   Establish 2-3 goals that they will plan to diligently work on until they return for the free 3-month follow-up visit  The following handouts were given in class:  3 Month Follow Up Visit handout  Goal setting handout  Class evaluation form  Your patient has established the following 3 month goals for diabetes self-care:  Count carbohydrates at most meals and snacks  Be active 150 minutes a week  Test glucose BID  Follow-Up Plan: Patient will attend a 3 month follow-up visit for diabetes self-management education.

## 2012-10-08 NOTE — Assessment & Plan Note (Signed)
BP Readings from Last 3 Encounters:  10/06/12 125/70  09/01/12 118/78  07/27/12 125/71    Lab Results  Component Value Date   NA 141 10/30/2011   K 4.6 10/30/2011   CREATININE 0.96 10/30/2011    Assessment:  Blood pressure control: controlled  Progress toward BP goal:  at goal  Comments:   Plan:  Medications:  continue current medications  Educational resources provided: brochure  Self management tools provided: home blood pressure logbook  Other plans:

## 2012-11-24 NOTE — Addendum Note (Signed)
Addended by: Neomia Dear on: 11/24/2012 07:57 PM   Modules accepted: Orders

## 2012-11-25 NOTE — Addendum Note (Signed)
Addended by: Neomia Dear on: 11/25/2012 06:42 PM   Modules accepted: Orders

## 2012-12-07 ENCOUNTER — Other Ambulatory Visit: Payer: Self-pay | Admitting: Radiation Oncology

## 2012-12-08 ENCOUNTER — Other Ambulatory Visit: Payer: Self-pay | Admitting: Radiation Oncology

## 2012-12-08 DIAGNOSIS — Z1211 Encounter for screening for malignant neoplasm of colon: Secondary | ICD-10-CM

## 2012-12-08 DIAGNOSIS — Z0000 Encounter for general adult medical examination without abnormal findings: Secondary | ICD-10-CM

## 2012-12-08 DIAGNOSIS — Z23 Encounter for immunization: Secondary | ICD-10-CM

## 2012-12-08 DIAGNOSIS — N529 Male erectile dysfunction, unspecified: Secondary | ICD-10-CM

## 2012-12-08 DIAGNOSIS — I10 Essential (primary) hypertension: Secondary | ICD-10-CM

## 2012-12-08 DIAGNOSIS — E119 Type 2 diabetes mellitus without complications: Secondary | ICD-10-CM

## 2012-12-08 DIAGNOSIS — E785 Hyperlipidemia, unspecified: Secondary | ICD-10-CM

## 2012-12-08 MED ORDER — INSULIN GLARGINE 100 UNIT/ML ~~LOC~~ SOLN: 30.0000 [IU] | Freq: Every day | SUBCUTANEOUS | Status: DC

## 2012-12-08 MED ORDER — PRAVASTATIN SODIUM 40 MG PO TABS: 40.0000 mg | ORAL_TABLET | Freq: Every evening | ORAL | Status: DC

## 2012-12-09 ENCOUNTER — Encounter: Payer: Self-pay | Admitting: Radiation Oncology

## 2013-02-01 ENCOUNTER — Ambulatory Visit: Payer: 59 | Admitting: *Deleted

## 2013-02-25 ENCOUNTER — Other Ambulatory Visit: Payer: Self-pay

## 2013-03-22 ENCOUNTER — Ambulatory Visit (INDEPENDENT_AMBULATORY_CARE_PROVIDER_SITE_OTHER): Payer: 59 | Admitting: Family Medicine

## 2013-03-22 VITALS — BP 141/73 | HR 60 | Ht 69.5 in | Wt 214.4 lb

## 2013-03-22 DIAGNOSIS — E119 Type 2 diabetes mellitus without complications: Secondary | ICD-10-CM

## 2013-03-22 NOTE — Progress Notes (Signed)
Subjective:  Patient presents today for an initial pharmacy diabetes visit as part of the employer-sponsored Link to Wellness program. Current diabetes regimen includes Lantus 40 units once daily and metformin 1000 mg twice daily. Patient also continues on daily ASA, ACEi, and statin. Most recent MD follow-up was in July.   He previously was one of Nancy's patients, but has been transitioned to pharmacy care going forward.   He states that he recently saw Dr. Nehemiah Settle in July. This was a change from going to the [facility]. He seems pleased with the change. His A1C was elevated at that appointment (patient believes that it was around 9). The most recent A1C was Harriett Sine was 8.5%.   He states that he is supposed to have a colonoscopy this month, but he is waiting for the clinic to call him to schedule an appointment.   He only has two functional teeth. He is scheduled to have partials replaced later this month. He has two different dental insurances and is having to coordinate benefits between the two so he can get the appliance that he needs. Some of his teeth are broken and will have to be surgically removed.   He was started on gabapentin in the evening for nerve pain. He states it was helping his nerve pain, but recently stopped taking it because it was causing a lot of drowsiness and grogginess in the morning. He is trying to see how many tablets he can take without waking up groggy.   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      Disease Assessments:  Diabetes:  Type of Diabetes: Type 2; Year of diagnosis 1999; Sees Diabetes provider 4 or more times per year; checks feet daily; takes medications as prescribed; Current Diabetes related medical conditions are High blood pressure, High cholesterol, Neuropathy; checks blood glucose less than 1 time a week; Diabetes Education Group  class [facility]; does not use glucometer True Result; MD managing Diabetes Dr. Sharyn Lull @ [place]; takes an aspirin a day;   Other Diabetes History: Patient has not been checking his blood sugar because his True Track meter broke. He was borrowing an older meter from a family member, but I could not get any averages from it because the battery was dead.   Gave patient a new True Result meter today, along with testing supplies. Encouraged patient to start checking his blood sugar once daily in the morning.   Social History:  Exercise adherence Never; Diet adherence 25-50% of the time craves ice cream. has ice cream 5 out of 7 nights per week. He does to cooking in the household. He realizes that he needs to provide a healthier meal plan for himself and the family.; Denies alcohol use; 0 minutes of exercise per week; Medication adherence adherent.  Occupation: [employer]   Nutrition-   Patient only has two functional teeth. He states that he isn't eating well and that his gums are ","torn up",". He does most of the cooking for his household. He states that he has cut back on using oil and has been using an electric grill. He states that he eats a lot of bread and knows that he should eat less. He eats a lot on the go because of his job and is snacking through the day instead of eating defined meals. He usually eats sandwiches for lunch. He uses  white bread normally, but lately he has been eating wheat bread. He is eating 2-3 sandwiches a day.   24 hour recall-  B- sandwich (2 slices of bread), liver pudding, fried egg (using PAM)  L- sandwich (2 slices bread- hamburger, deli meat, velveeta cheese)- sometimes homemade, sometimes from fast food. Fruit (cantaloupe or watermelon)  D- spaghetti or pasta. He has been cutting back on fried foods.   He states that he usually cooks whatever his wife or daughter wants to eat. His wife is trying to be more concious of what she is eating.    He states that he is waking up in the middle of the night to eat. He states that he knows he isn't hungry, but it is more of a habit. He also admits to eating a lot of ice cream.   Physical Activity- He lives behind [employer] school and he can use the track. He hasn't started using it yet.       Vital Signs:  03/22/2013 11:02 AM (EST) Blood Pressure 141 / 73 mm/Hg; Height 5 ft 10 in; Pulse Rate 60 bpm; Weight 214.4 lbs      Assessment/Plan: Patient is a 62 year old male with DM2. A1C from June is not currently at goal. I have contacted [facility] and am waiting to hear back to see what his most recent A1C was when he saw Dr. Nehemiah Settle a few weeks ago. Lantus dose was increased at his last appointment to 40 units once daily. Patient expressed a desire to get his A1C back down to goal and states he believes that it was elevated because he was not making healthy eating choices.   We spent the majority of the visit discussing nutrition. Patient appears to have a basic understanding of what foods contain carbohydrates. We reviewed carbohydrate counting and how to identify servings sizes for carbohydrates. Eating is complicated by the lack of a working set of teeth. He is currently working with a dentist to have a set of partials made. He is eating a lot of melon now and he had a lot of questions about whether it was healthy. We reviewed how much melon was a serving size.   Patient states that his daughter and wife ask him to buy a lot of junk food. He states that he is trying to make better choices because everyone in his family is overweight, but he is meeting with resistance. I explained to patient that because he does the majority of the grocery shopping and cooking he is in a position of power to help his family make better choices and to become healthier. He agreed to cut back on the ice cream consumption and to also stop middle of the night snacking.   Patient expressed a desire to start physical  activity again. Encouraged patient to start walking at least three times a week.   Follow up with patient in 6 weeks. Review nutrition, carbohydrate counting and physical activity..    Goals for Next Visit-  1. For each meal- aim to get 3-4 servings of carbohydrates.  2. Increase your discipline in terms of snacking at work and at home-especially on sweet snacks. Limit yourself to 1 sweet treat per week at work.  3. Reach for fruit when you want a snack- melons, unsweetened applesauce, canned peaches or pears (but only when canned in their own juice, not in syrup).  4. Limit or cut out junk food purchases from the store. 5. Start walking three days  a week for 30 minutes. 6. Check your blood sugar once in the morning.   Next appointment to see me is Monday September 15th at 8 AM.

## 2013-05-03 ENCOUNTER — Ambulatory Visit (INDEPENDENT_AMBULATORY_CARE_PROVIDER_SITE_OTHER): Payer: Self-pay | Admitting: Family Medicine

## 2013-05-03 VITALS — BP 159/79 | HR 61 | Ht 70.0 in | Wt 214.0 lb

## 2013-05-03 DIAGNOSIS — E119 Type 2 diabetes mellitus without complications: Secondary | ICD-10-CM

## 2013-05-03 NOTE — Progress Notes (Signed)
Subjective:  Patient presents today for 6 week diabetes follow-up as part of the employer-sponsored Link to Wellness program. Current diabetes regimen includes Lantus 40 units once daily and metformin 1000 mg twice daily. Patient also continues on daily ASA, ACEi, and statin. Most recent MD follow-up was in June with Dr. Nehemiah Settle. No med changes or major health changes at this time.   Patient states that other than a cold this past week, everything has been going OK since his last appointment.   For his pending dental work he is waiting to hear from [employer] to see what they will pay for the procedure. As soon as his dental office hears back from the insurance, he will have the remaining broken teeth pulled and then he will have the dentures made.   Patient states that he has an appointment pending with Dr. Nehemiah Settle but he is not sure on the date.    Disease Assessments:  Diabetes:  Type of Diabetes: Type 2; Year of diagnosis 1999; Sees Diabetes provider 4 or more times per year; checks feet daily; takes medications as prescribed; Current Diabetes related medical conditions are High blood pressure, High cholesterol, Neuropathy; Diabetes Education [employer] class [facility]; does not use glucometer True Result; MD managing Diabetes Dr. Sharyn Lull @ [facility]; takes an aspirin a day; checks blood glucose 2-6 times a week;   Lowest CBG 86; Highest CBG 281; hypoglycemia frequency rare; 7 day CBG average 183; 14 day CBG average 167; 30 day CBG average 165;   Other Diabetes History: Patient brought his meter with him to his appointment. He states that he is checking it once daily at night. He tries to do it before dinner. This is an improvement- before patient was not checking his blood sugar at all. Patient states that he often forgets his meter when he goes out of town and doesn't check his blood sugars. I gave him an additional meter for him to keep in his suitcase so that he would be able to check  his blood sugar.   CBG was 246 on Saturday. This was following a cook out- he states he overindulged on ice cream and other food.   Patient voiced understanding on how to recognize and treat a low blood sugar. He usually eats a piece of hard candy or gets a regular soda at work.    Nutrition-   Patient only has two functional teeth. He states that he isn't eating well and that his gums are ","torn up","  Since his last appointment he and his family have switched to wheat bread. He is still limiting fried foods and states that he has almost cut it out. He still eats a sandwich once a day for either breakfast or lunch. He has been cutting back on his bread consumption. He also states that he has started eating [place] burgers.   At work he has been eating more fruits for a snack. He has cut back on the candy and cookies. He states that he has a 4 pack of nabs. He also states he has cut back on buying junk food from the grocery store.   Overall he has made a lot of changes to his diet since his last appointment with me.   Physical Activity-  He has been working long hours lately and last week he worked 70 hours. He has been walking some on the track at [employer]. He thinks that he is getting to the track 2-3 times a week.  He is spending between 25-35 minutes walking. He and his wife are planning to buy bicycles and ride together as a family. When he works he is lifting and moving furniture.   Assessment/Plan: At risk for skin problems, Chronic health condition(s); Goal: Maximize self-mgt of chronic health condition(s); Additional Comments: I never heard back from Dr. Idelle Crouch office regarding his A1C. Since he states he has an appointment coming up soon, I will defer A1C testing to his office.I encouraged patient to call Dr. Idelle Crouch office to find out when his appointment is. If he has not had an A1C by his next visit with me, check A1C.   Overall I am very pleased with Mr. Bloxham  progress. He has cut back on fried foods and junk foods. He is eating more fruits and vegetables and has also switched to wheat breads. He has start exercising a few days a week. He states that his whole family has made changes and he hopes that they can all be healthier.   It is hard to interpret CBG results because patient states he is checking his blood sugar at night and often is checking right after he eats dinner. I asked him to start checking first thing in the morning instead of at night. Patient agreed to this change.   I also encouraged patient to follow through with his dental insurance to ensure that he can get dentures made this year before his deductible resets. Not having teeth is a major barrier to eating for him.   BP was elevated today. I rechecked BP a few minutes after the first check and it was still elevated >160/90. Patient stated he was drinking coffee for breakfast. He purchased a BP cuff today for $5 and agreed to start checking his BP at home. I encouraged him to bring this up with Dr. Nehemiah Settle at his next appointment.   Follow up with patient in 8 weeks..    Goals for Next Visit-  1. Call Dr. Idelle Crouch office today to see when your next appointment is.  2. Follow through with the dental insurance to make sure that you can get your dentures made. 3. Buy a BP cuff today and start checking your blood pressure once a day.  4. Check your blood sugar once daily in the morning before you have anything to eat.  5. Continue making healthy choices.  6. Continue walking- try to get out at least 3 days a week for 30 minutes.  Next appointment to see me is Monday November 10th at 8 AM at the pharmacy.   Great job on making changes from your last appointment!!!

## 2013-05-10 NOTE — Progress Notes (Signed)
Patient ID: Timothy Peters, male   DOB: [birth year], 62 y.o.   MRN: [id number] ATTENDING PHYSICIAN NOTE: I have reviewed the chart and agree with the plan as detailed above. Denny Levy MD Pager [phone]

## 2013-06-28 ENCOUNTER — Ambulatory Visit (INDEPENDENT_AMBULATORY_CARE_PROVIDER_SITE_OTHER): Payer: Self-pay | Admitting: Family Medicine

## 2013-06-28 VITALS — BP 149/89 | HR 65 | Ht 71.0 in | Wt 216.0 lb

## 2013-06-28 DIAGNOSIS — E119 Type 2 diabetes mellitus without complications: Secondary | ICD-10-CM

## 2013-06-28 NOTE — Progress Notes (Signed)
Subjective:  Patient presents today for 2 month diabetes follow-up as part of the employer-sponsored Link to Wellness program. Current diabetes regimen includes Lantus 40 units once daily and metformin 1000 mg BID. Patient also continues on daily ASA, ACEi, and statin.   Medication list reviewed. Meds refilled last week.    Patient states that things have been doing good. He has been working a lot lately. He states that he is doing a lot of walking in the afternoons. He walks while his daughter rides her bike. He wants to get a bike with his wife and plans to start riding together as a family.   He has a pending appointment to see Dr. Nehemiah Settle next week. He isn't sure if he will get blood work done at this coming appointment.   He has been working with his dentist to get his teeth fixed. He has two dental insurances and his out of pocket costs would be ~$1300. He wants to get a second opinion to see if he can find a dentist that will be able to perform the surgery for less money. He states that he has other financial obligations and cannot afford the $1300 for the surgery.    Disease Assessments:  Diabetes:  Type of Diabetes: Type 2; Year of diagnosis 1999; Sees Diabetes provider 4 or more times per year; checks feet daily; takes medications as prescribed; Current Diabetes related medical conditions are High blood pressure, High cholesterol, Neuropathy; Diabetes Education Group class [facility]; does not use glucometer True Result; MD managing Diabetes [facility]. Sharyn L[facility]; takes an aspirin a day; checks blood glucose 2-6 times a week; hypoglycemia frequency rare;   7 day CBG average 187; 14 day CBG average 153; 30 day CBG average 149; Highest CBG 215; Lowest CBG 82; Other Diabetes History: Patient is checking blood sugar once a day in the evening (he states that he is too busy to check his blood sugar in the morning). He states that he is usually rushing in the morning to get his wife to work and  his daughter to school.   7 day average is high compared to the 14 and 30 day averages. This last week patient states that he checked his CBG 30 minutes after eating dinner. He also states that his mother's birthday party was this last week and so he ate more than usual.   As compared to last visit, 14 and 30 day averages are improved by 15-20 points.   Nutrition-   Patient only has two functional teeth. He states that he isn't eating well and that his gums are ,torn up,  Patient continues to make diet changes since he first started seeing me. He states that they are rarely eating fried foods and is mostly baking meats. He states that he has been buying meat with a lower fat content. He states that he has been cutting back on biscuit consumption and has switched to wheat bread. He mainly brings his lunch to work.   Physical Activity-   Lately he has been more physically active with work. He works for a [employer] and they have been doing a lot of office moves. He states he has been getting more exercise than he wants. He has been doing some walking with his daughter while she rides her bike. He states that this is just once or twice a week.           Colonoscopy: not yet. Primary care has suggested. Will follow through  with now that he has the insurance.  Dilated Eye Exam: 09/14/2012  Flu vaccine: 09/01/2012  Foot Exam: 09/01/2012  Lipid Panel: 08/03/2012  Other Preventive Care Notes: Mr. Choplin has a great deal of dental work that needs to be addressed. He has only 2 good teeth. This complicates his eating.       Vital Signs:  06/28/2013 8:23 AM (EST) BMI 30.1; Height 5 ft 11 in; Weight 216 lbs    Assessment/Plan: Patient is a 62 year old male with DM2. A1C today was 8.4% and is above goal of <7%. Patient continues with healthier eating choices. His exercise at home has waned some. He states he is busier with work and hasn't gotten out to walk on the track as much as he  was doing previously. However, he has been doing more moves with his company and he has been doing more lifting and physical activity.   I will send A1C results to Dr. Idelle Crouch office. Given the lifestyle changes he is making already, he may need the addition of a short acting insulin. He may also benefit from a GLP 1 agonist. I encouraged patient to keep his appointment with Dr. Nehemiah Settle and explained that he may need to go on another medication to get his A1C down to goal. In discussing insulin use patient states that he was previously an IV drug user and that now whenever he uses needles he starts to get shaky and nervous (he has been clean for several years now). He states that he can usually do his Lantus without any issue.   BP was elevated initially when patient came to the appointment (161/89) but had dropped to 149/89 by the end of the visit. Patient states he had taken his enalapril this morning and had a large coffee for breakfast. He purchased a BP cuff at his last appointment with me and checks his BP daily. He was unable to give me a range for his BP other than that "it's usually good".   Patient continues to check CBG in the evenings shortly after eating. We discussed today how to make changes with his morning routine so that he could start checking a fasting CBG in the morning. Patient agreed to start checking before he got into the shower.   Patient has been unable to get the dental work needed so that he can have a working set of teeth. Patient states that he wants to get a second opinion to see if another dentist will be cheaper. I encouraged him to follow through with this and to start saving money now so that he can get his teeth fixed.   Follow up with patient in 2 months..    Goals for Next Visit-  1. Make an appointment with the second dentist for a consultation. When you make the appointment, call over to the first dentist and request that they send your records and x-rays to the  new dentist. 2. Get a flu shot at your next appointment with Dr. Nehemiah Settle. 3. Start checking your blood sugar in the morning when you first wake up- get into the habit of checking your blood sugar before you get into shower.  4. Start eating a snack in the afternoon- piece of fruit, cereal bar, 4 pack of crackers so your blood sugar doesn't go low in the evening while you are waiting to eat dinner.  5. Switch to eating oatmeal for breakfast in the morning.  6. Keep your appointment with Dr. Nehemiah Settle.  Next appointment to see me is Monday January 19th at 9:30 AM.   Ellender Hose. Vivia Ewing, PharmD, BCPS

## 2013-09-06 ENCOUNTER — Ambulatory Visit (INDEPENDENT_AMBULATORY_CARE_PROVIDER_SITE_OTHER): Payer: Self-pay | Admitting: Family Medicine

## 2013-09-06 VITALS — BP 131/77 | HR 63 | Ht 71.0 in | Wt 217.4 lb

## 2013-09-06 DIAGNOSIS — E119 Type 2 diabetes mellitus without complications: Secondary | ICD-10-CM
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
## 2013-11-01 ENCOUNTER — Ambulatory Visit (INDEPENDENT_AMBULATORY_CARE_PROVIDER_SITE_OTHER): Payer: Self-pay | Admitting: Family Medicine

## 2013-11-01 VITALS — BP 145/84 | HR 87 | Ht 71.0 in | Wt 219.4 lb

## 2013-11-01 DIAGNOSIS — E119 Type 2 diabetes mellitus without complications: Secondary | ICD-10-CM
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
## 2013-11-15 ENCOUNTER — Encounter (HOSPITAL_COMMUNITY): Payer: Self-pay | Admitting: Emergency Medicine

## 2013-11-15 ENCOUNTER — Emergency Department (HOSPITAL_COMMUNITY)
Admission: EM | Admit: 2013-11-15 | Discharge: 2013-11-15 | Disposition: A | Discharge status: Home / Self Care | Payer: 59 | Attending: Emergency Medicine | Admitting: Emergency Medicine

## 2013-11-15 DIAGNOSIS — S43499A Other sprain of unspecified shoulder joint, initial encounter: Secondary | ICD-10-CM | POA: Insufficient documentation

## 2013-11-15 DIAGNOSIS — I10 Essential (primary) hypertension: Secondary | ICD-10-CM | POA: Insufficient documentation

## 2013-11-15 DIAGNOSIS — Z7982 Long term (current) use of aspirin: Secondary | ICD-10-CM | POA: Insufficient documentation

## 2013-11-15 DIAGNOSIS — E785 Hyperlipidemia, unspecified: Secondary | ICD-10-CM | POA: Insufficient documentation

## 2013-11-15 DIAGNOSIS — Z79899 Other long term (current) drug therapy: Secondary | ICD-10-CM | POA: Insufficient documentation

## 2013-11-15 DIAGNOSIS — Z794 Long term (current) use of insulin: Secondary | ICD-10-CM | POA: Insufficient documentation

## 2013-11-15 DIAGNOSIS — Y92410 Unspecified street and highway as the place of occurrence of the external cause: Secondary | ICD-10-CM | POA: Insufficient documentation

## 2013-11-15 DIAGNOSIS — Y9389 Activity, other specified: Secondary | ICD-10-CM | POA: Insufficient documentation

## 2013-11-15 DIAGNOSIS — S46819A Strain of other muscles, fascia and tendons at shoulder and upper arm level, unspecified arm, initial encounter: Principal | ICD-10-CM

## 2013-11-15 DIAGNOSIS — Z87891 Personal history of nicotine dependence: Secondary | ICD-10-CM | POA: Insufficient documentation

## 2013-11-15 DIAGNOSIS — E119 Type 2 diabetes mellitus without complications: Secondary | ICD-10-CM | POA: Insufficient documentation

## 2013-11-15 DIAGNOSIS — S336XXA Sprain of sacroiliac joint, initial encounter: Secondary | ICD-10-CM | POA: Insufficient documentation

## 2013-11-15 DIAGNOSIS — S46811A Strain of other muscles, fascia and tendons at shoulder and upper arm level, right arm, initial encounter: Secondary | ICD-10-CM

## 2013-11-15 DIAGNOSIS — S39012A Strain of muscle, fascia and tendon of lower back, initial encounter: Secondary | ICD-10-CM

## 2013-11-15 MED ORDER — NAPROXEN 500 MG PO TABS: 500.0000 mg | ORAL_TABLET | Freq: Two times a day (BID) | ORAL | Status: DC

## 2013-11-15 MED ORDER — HYDROCODONE-ACETAMINOPHEN 5-325 MG PO TABS: 1.0000 | ORAL_TABLET | Freq: Four times a day (QID) | ORAL | Status: DC | PRN

## 2013-11-15 MED ORDER — METHOCARBAMOL 500 MG PO TABS: 500.0000 mg | ORAL_TABLET | Freq: Two times a day (BID) | ORAL | Status: DC
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
## 2013-11-15 NOTE — Discharge Instructions (Signed)
Recommend naproxen and Robaxin as prescribed for symptoms. You may take Norco as prescribed for breakthrough pain control. Apply ice to the affected area 3-4 times per day for at least 20 minutes each time. Refrain from strenuous activity or heavy lifting.  Muscle Strain A muscle strain is an injury that occurs when a muscle is stretched beyond its normal length. Usually a small number of muscle fibers are torn when this happens. Muscle strain is rated in degrees. First-degree strains have the least amount of muscle fiber tearing and pain. Second-degree and third-degree strains have increasingly more tearing and pain.  Usually, recovery from muscle strain takes 1 2 weeks. Complete healing takes 5 6 weeks.  CAUSES  Muscle strain happens when a sudden, violent force placed on a muscle stretches it too far. This may occur with lifting, sports, or a fall.  RISK FACTORS Muscle strain is especially common in athletes.  SIGNS AND SYMPTOMS At the site of the muscle strain, there may be:  Pain.  Bruising.  Swelling.  Difficulty using the muscle due to pain or lack of normal function. DIAGNOSIS  Your health care provider will perform a physical exam and ask about your medical history. TREATMENT  Often, the best treatment for a muscle strain is resting, icing, and applying cold compresses to the injured area.  HOME CARE INSTRUCTIONS   Use the PRICE method of treatment to promote muscle healing during the first 2 3 days after your injury. The PRICE method involves:  Protecting the muscle from being injured again.  Restricting your activity and resting the injured body part.  Icing your injury. To do this, put ice in a plastic bag. Place a towel between your skin and the bag. Then, apply the ice and leave it on from 15 20 minutes each hour. After the third day, switch to moist heat packs.  Apply compression to the injured area with a splint or elastic bandage. Be careful not to wrap it too  tightly. This may interfere with blood circulation or increase swelling.  Elevate the injured body part above the level of your heart as often as you can.  Only take over-the-counter or prescription medicines for pain, discomfort, or fever as directed by your health care provider.  Warming up prior to exercise helps to prevent future muscle strains. SEEK MEDICAL CARE IF:   You have increasing pain or swelling in the injured area.  You have numbness, tingling, or a significant loss of strength in the injured area. MAKE SURE YOU:   Understand these instructions.  Will watch your condition.  Will get help right away if you are not doing well or get worse. Document Released: 08/05/2005 Document Revised: 05/26/2013 Document Reviewed: 03/04/2013 [facility] Patient Information 2014 [place], [place].

## 2013-11-16 NOTE — ED Provider Notes (Signed)
Medical screening examination/treatment/procedure(s) were performed by non-physician practitioner and as supervising physician I was immediately available for consultation/collaboration.   EKG Interpretation None       Anaston Koehn R. Hobert Poplaski, MD 11/16/13 0022 

## 2014-04-04 ENCOUNTER — Ambulatory Visit (INDEPENDENT_AMBULATORY_CARE_PROVIDER_SITE_OTHER): Payer: Self-pay | Admitting: Family Medicine

## 2014-04-04 VITALS — BP 154/89 | HR 70 | Wt 221.0 lb

## 2014-04-04 DIAGNOSIS — E119 Type 2 diabetes mellitus without complications: Secondary | ICD-10-CM

## 2014-04-04 DIAGNOSIS — E1165 Type 2 diabetes mellitus with hyperglycemia: Secondary | ICD-10-CM

## 2014-04-04 NOTE — Progress Notes (Signed)
Subjective:  Patient presents today for 2 month diabetes follow-up as part of the employer-sponsored Link to Wellness program. Current diabetes regimen includes Lantus 60 units SQ once daily, Januvia 100 mg daily and metformin 1000 mg BID. Patient also continues on daily ASA, ACEi, and statin. Pravastatin has been changed to simvastatin and Viagra was changed to cialis. Patient states that he has been under increased stress lately. His 62 year old granddaughter was found unconcious several weeks ago and was taken to the hospital. She was diagnosed with diabetes but had several complications including brain swelling and DVTs. She is still at the [place] [facility]. Patient has been travelling frequently to [facility] to see his granddaughter and provide support for his son. He admits that he has not been taking care of himself like he should. His granddaughter has been improving and may be discharged in the next week. He last saw Dr. Delfina Redwood last month in July. He states that his A1C has increased to 8.5% (was 7.7% in June with Melissa). At that point he increased Lantus to 60 units. He states that he hasn't been exercising or eating right. His mother was hospitalized over the summer and was in the ICU and he has had a lot of other family problems. He states that he hasn't been doing what he needed to do. His next appointment with Dr. Delfina Redwood is in a few months. At his last visit with [facility] (covering for me over the summer) she discussed with him going back for DM education classes at the [facility]. Patient states he was contacted by [facility] and they didn't feel like he needed the refresher course at this time.   Disease Assessments:  Diabetes: Type of Diabetes: Type 2; Year of diagnosis 1999; Sees Diabetes provider 4 or more times per year; checks feet daily; Current Diabetes related medical conditions are High blood pressure, High cholesterol, Neuropathy; MD managing Diabetes Dr. Babs Sciara @  [place]; takes an aspirin a day; What is target A1c? 7.0 %; Diabetes Education Group class 2014 [facility]; checks blood glucose Never Checks at different times mostly at work; hypoglycemia frequency rare;   does not use glucometer True Result; takes medications as prescribed;   Other Diabetes History: Patient hasn't been checking blood sugar for the past several weeks because he dropped his blood sugar meter in the toilet. When he came to pick up prescriptions last Friday I gave him a new True Result Meter. He brought the meter with him today and he had three checks this week. 90,113, 166. He states he usually checks blood sugar once daily. We made the goal for him to continue checking blood sugar at least once a day.   Tobacco Assessment: Smoking Status: Former smoker; Last Reviewed: 04/04/2014  smoked for 45; Date quit smoking: 2009   Social History:  Denies alcohol use; Medication adherence adherent; No drug use; 30 minutes of exercise per week; Patient can afford medications; Exercise adherence 1-2 days a week; Diet adherence 25-50% of the time; Caffeine use: uses sweet and low sweetener.  Home Environment: stable  Occupation: [place]  Physical Activity- Patient states that he hasn't been getting any additional physical activity outside of work. (Works at [employer]). 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 He lives behind a  school and has access to the walking track there.  Nutrition- He states that he has not been doing as well with this lately. Patient still only has a few remaining functional teeth.  Typical Day- B- a lot of days he is skipping breakfast. In the mid morning he is grabbing something to eat. Sometimes it is fast food. He has been eating croissants because he is  trying to stay away from biscuits. Sometimes he has a sandwich.  [place] with a small amount of mayonaise on toasted wheat bread; baked chicken breast, sometimes he is eating peanut butter and crackers. Depending on where he is working he sometimes stops at [employer] for a burger or chicken sandwich.  afternoon- baked potatoes; beans. He has been trying to cut back on red meat.  D- broccoli and cheese, spinach, frozen vegetables, meat. He states that he cooks his meals on an electric grill. He states he is trying to avoid fried foods. He is sometimes eating salads for dinner.    Other Preventive Care Notes:  Mr. Vespa has a great deal of dental work that needs to be addressed. He has only 2 good teeth. This complicates his eating.  He had another appointment with a second dentist for a second opinion on the dental work that he needs done. He is waiting to hear back from the insurance company to see what his responsibility is.      Vital Signs:  04/04/2014 9:35 AM (EST)Blood Pressure 154 / 89 mm/HgBMI 30.8; Height 5 ft 11 in; Pulse Rate 70 bpm; Weight 221 lbs            Assessment/Plan: Patient is a 63 year old male with DM2. I am waiting to hear from Dr. Lina Sar office to see what his most recent A1C is. According to patient it was elevated above 8% which is above goal of less than 7%. Lantus was increased at his visit last month to 60 units. However, patient has been unable to check his blood sugar because his meter was dropped in the toilet, so I am unable to assess whether his CBGs are now in goal range since the dose increase. Patient admits that he has not been taking care of himself. I spent the majority of the visit working with patient to develop a plan to help him better manage his blood sugar. We discussed eating habits and reviewed carbohydrate counting and the plate method. I showed patient how to retrieve nutrition information from the internet and on his phone. We reviewed goal  carbohydrate serving sizes of 45-60 grams of carbohydrates with each meal. Patient travels frequently with his job and is not always able to bring his lunch. We pulled the nutrition information from his favorite fast food restaraunts and put together meals that would be less than 45-60 grams of CHO. We also discussed exercising with patient. Now that his granddaughter has improved he doesn't think he will be driving to [facility] as frequently as before. He wants to start walking again twice a week at the track behind his house. Follow up with patient in 2 months.      Goals for Next Visit- 1. When you are at home, try to use the plate method (1/2 your plate non starchy vegetables, 1/4 of your plate carbohydrates/starches (this includes beans), 1/4 of your plate protein source. 2. When you go out to eat just get the chicken sandwich or the burger, don't eat the french fries or baked potato. 3.  For dinner- try to eat salads during the week. Use your electric grill to cook the chicken for your salad. 4. Physical Activity- get to the track twice a week to walk. 5. Keep checking your blood sugar at least once a day. 6. For nutrition information, go to google and type in the food name with nutrition behind it. Other websites- calorie king. Next appointment to see me is Monday October 26th at 8:30 AM.

## 2014-04-26 ENCOUNTER — Encounter: Payer: Self-pay | Admitting: Family Medicine

## 2014-04-26 NOTE — Progress Notes (Signed)
Patient ID: Timothy Peters, male   DOB: 09/07/1950, 63 y.o.   MRN: [id number] Reviewed: Agree with the documentation and management of our [place] [place] Pharmacologist. 

## 2014-04-26 NOTE — Progress Notes (Signed)
Patient ID: Timothy Peters, male   DOB: 07/30/1951, 63 y.o.   MRN: [id number] Reviewed: Agree with the documentation and management of our [place] Pharmacologist. 

## 2014-04-26 NOTE — Progress Notes (Signed)
Patient ID: Timothy Peters, male   DOB: 02/09/1951, 63 y.o.   MRN: [id number] Reviewed: Agree with the documentation and management of our [place] Pharmacologist. 

## 2014-04-26 NOTE — Progress Notes (Signed)
Patient ID: Timothy Peters, male   DOB: 12/24/1950, 63 y.o.   MRN: [id number] Reviewed: Agree with the documentation and management of our [place] Pharmacologist. 

## 2014-06-16 ENCOUNTER — Encounter: Payer: Self-pay | Admitting: Family Medicine

## 2014-06-16 NOTE — Progress Notes (Signed)
Patient ID: Timothy Peters, male   DOB: 07/22/1951, 63 y.o.   MRN: [id number] Reviewed: Agree with the documentation and management of our [place] Pharmacologist. 

## 2014-06-27 ENCOUNTER — Ambulatory Visit (INDEPENDENT_AMBULATORY_CARE_PROVIDER_SITE_OTHER): Payer: Self-pay | Admitting: Family Medicine

## 2014-06-27 VITALS — BP 140/70 | Wt 226.0 lb

## 2014-06-27 DIAGNOSIS — E1165 Type 2 diabetes mellitus with hyperglycemia: Secondary | ICD-10-CM

## 2014-06-27 NOTE — Assessment & Plan Note (Signed)
Subjective:  Patient presents today for 2 month diabetes follow-up as part of the employer-sponsored Link to Wellness program. Current diabetes regimen includes Lantus 60 units once daily, Januvia 100 mg daily and metformin 1000 mg twice daily. Patient also continues on daily ASA, ACEi, and statin.   Patient has been under a considerable amount of stress lately. His son passed away 6 weeks ago, his nephew was shot at [facility] [employer] homecoming party last month and died, his soon to be brother in law suddenly died, and he states that he has been having some other family issues too. He admits that he hasn't been taking care of himself as much in the past two weeks.  Last visit with Dr. Delfina Redwood was about 3 months ago. He has an appointment pending later this month.  Advanced Care Planning: Date Discussed 01/24/2014; No Living Will in place; No Advanced Directive; Information was given  Disease Assessments:   Diabetes: Type of Diabetes: Type 2; Year of diagnosis 1999; Sees Diabetes provider 4 or more times per year; checks feet daily; Current Diabetes related medical conditions are High blood pressure, High cholesterol, Neuropathy; MD managing Diabetes Dr. Babs Sciara @ [place]; takes an aspirin a day; What is target A1c? 7.0 %; Diabetes Education Group class 2014 [facility]; hypoglycemia frequency rare; does not use glucometer True Result; takes medications as prescribed;   7 day CBG average 150; 14 day CBG average 132; 30 day CBG average 141; Highest CBG 273; Lowest CBG 75; checks blood glucose 1-2 times a week  Checks at different times mostly at work;   Other Diabetes History:  A1C today 8.2. He states that he has not been checking blood sugar very often or at all over the past 2 months.  Few readings in the 200s- when he was eating ice cream. He has a handful of checks in the last month. He has struggled to check blood sugar on a consistent basis. He states that he does not  have enough time to check blood sugar in the morning. He thinks that he can check in the evening.        Tobacco Assessment: Smoking Status: Former smoker; Last Reviewed: 06/27/2014  smoked for 45; Date quit smoking: 2009   Social History:  Denies alcohol use; Medication adherence adherent; No drug use; Patient can afford medications; Diet adherence 25-50% of the time; Caffeine use: uses sweet and low sweetener; Exercise adherence 3-4 days a week; 60 minutes of exercise per week.  Home Environment: stable  Occupation: [place]  Physical Activity- He recently purchased an elliptical machine. He is using it twice daily and is spending about 20 minutes total on it the days that he uses it. He estimates that he is on it about 3 days a week. He reports that he is sore.  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  Nutrition- Patient still only has a few remaining functional teeth. He states that eating has been worse lately because of the stress. He states that he knows he should be better but he has been frustrated with everything else and doesn't care. He has been snacking late at night too. He has been eating fast food more often. Yesterday is the first day that he cooked in several weeks.    Preventive Care:      Colonoscopy: not yet. Primary care has suggested. Will follow through with now that he has the insurance.  Dilated  Eye Exam: 08/19/2013  Flu vaccine: 04/19/2013 Status: RefusedPatient states that he does not want to get the flu shot this year.  Foot Exam: 11/15/2013  Lipid Panel: 11/15/2013  Prostate Exam 04/19/2013  Other Preventive Care Notes:  Mr. Dieudonne has a great deal of dental work that needs to be addressed. He has only 2 good teeth. This complicates his eating.       Vital Signs:  06/27/2014 2:29 PM (EST)Blood Pressure 140 / 70 mm/HgBMI 31.5; Height 5 ft 11 in; Weight 226 lbs   Testing:  Blood Sugar Tests: Hemoglobin  A1c: 8.2 via poct resulted on 06/27/2014         Care Planning:  Learning Preference Assessment:  Learner: Patient  Readiness to Learn Barriers: None  Teaching Method: Demonstration, Explanation, Handout  Evaluation of Learning: Can function independently and verbalize knowledge  Readiness to Change:  How important is your health to you? 10  How confident are you in working to improve your health? 10  How ready are you to change to improve your health? 10  Total Score: 10  Care Plan:  06/27/2014 2:29 PM (EST) (1)  Problem: Patient not checking CBG  Role: Clinical Pharmacist  Long Term Goal Start checking CBG once daily in the evening  Date Started: 06/27/2014     Assessment/Plan: Patient is a 63 year old male with DM2. A1C today was 8.2% and is elevated above goal of less than 7%. Patient and his family have experienced significant emotional trauma in the past few weeks and months. I spent the majority of the visit listening to the patient discuss his trials and struggles during this time. Patient admits that he has not been taking care of himself lately. Up until the last week he has not been exercising and he is eating out for the majority of his meals. Patient states that he knows what he needs to do in order to be better, he just needs to do it.  Patient wants to start cooking for himself again. He also wants to start walking at the high school track behind his house. I encouraged him to start physical activity again and to limit eating out. A1C was 8.2% today. I don't think that patient has been at A1C goal with me since he started seeing me in 2014. I believe that lifestyle changes will improve A1C, but I am not sure patient will be able to bring A1C to goal.   I faxed Dr. Delfina Redwood a copy of his A1C. Patient may benefit from the addition of a GLP-1 agonist, like Victoza.  Follow up with patient in 6 weeks.  Goals for Next Appointment- 1. Start walking again. Aim for 3-4 times  for 30 minutes. 2. Continue using the elliptical machine at least 3 times a week. 3. Start checking blood sugar again daily at night- at least 2 hours after you eat dinner. 4. Start cooking at home again. Limit eating out. Next appointment to see me is Monday December 21st at 8 AM.

## 2014-08-08 NOTE — Progress Notes (Signed)
Patient ID: Timothy Peters, male   DOB: 1951/07/04, 63 y.o.   MRN: [id number] Reviewed: Agree with the documentation and management of our [place].

## 2014-09-12 ENCOUNTER — Ambulatory Visit (INDEPENDENT_AMBULATORY_CARE_PROVIDER_SITE_OTHER): Payer: Self-pay | Admitting: Family Medicine

## 2014-09-12 VITALS — BP 126/70 | Wt 220.6 lb

## 2014-09-12 DIAGNOSIS — E1165 Type 2 diabetes mellitus with hyperglycemia: Secondary | ICD-10-CM

## 2014-09-12 NOTE — Assessment & Plan Note (Signed)
Subjective:  Patient presents today for 24monthdiabetes follow-up as part of the employer-sponsored Link to Wellness program. Current diabetes regimen includes Lantus 60 units once daily, Metformin 1000 mg BID and Januvia 100 mg once daily. Patient also continues on daily ASA, ACEi, and statin. No med changes or major health changes at this time.   Patient states that he last had an appointment with Dr. PDelfina Redwoodin November. He did not have A1C checked at that visit. No medication changes from that visit.  Patient reports that things have not been going well for him. He injured his back at work a few weeks ago and has been in pain. He also had a cold and did not feel good for a few weeks. He was taking Creo-mulsion and robitussin. He is not taking anything for his cold now as he has improved.  Patient states that he has been eating more fruit in the last few weeks. He has been eating grapefruits, oranges and bananas. I cautioned patient to avoid eating grapefruits because of the drug interaction with simvastatin. He reports he has had some cramping in his legs.    Disease Assessments:  Diabetes: Type of Diabetes: Type 2; Year of diagnosis 1999; Sees Diabetes provider 4 or more times per year; checks feet daily; Current Diabetes related medical conditions are High blood pressure, High cholesterol, Neuropathy; MD managing Diabetes [email or web address] takes an aspirin a day; What is target A1c? 7.0 %; Diabetes Education Group class 2014 [facility]; hypoglycemia frequency rare; takes medications as prescribed; does not use glucometer True Result;   7 day CBG average 134; 14 day CBG average 161; 30 day CBG average 172; Highest CBG 285 285- christmas day  ; Lowest CBG 85; checks blood glucose 2-6 times a week; Other Diabetes History:  He reports that he is checking blood sugar 3-5 times a week, usually in the evenings before he eats dinner. This is an improvement  since his last visit with me when he was not checking CBG at all.  Patient states that he was using cough syrup (Creomulsion and Robitussin) for a couple of weeks and noticed his blood sugar was elevated. He has also been eating a lot of fruit lately which has increased blood sugar.  A1C today was 9.6%. 14 and 30 day CBG averages are higher today than at last visit with me.    Tobacco Assessment: Smoking Status: Former smoker; Last Reviewed: 06/27/2014  smoked for 45; Date quit smoking: 2009           Physical Activity- He has an elliptical machine that allows him to be seated. He has not assembled it yet. He is recovering from a back injury right now and is not exercising now.  Nutrition- Patient still only has a few remaining functional teeth.  He reports that over Christmas and New Years he has been eating more, especially oranges. He estimates that he is eating up to 4 a day. He has also been eating more bananas because he was having cramps and thought his potassium was low. Lately he is getting 2-6 servings of fruit a day. He is usually snacking on the fruit, not eating with a meal. He is also eating peanut butter at night.   He reports that he has almost stopped eating fried foods. He is baking or boiling meats. He is eating baked potatoes.   Reviewed carbohydrate counting with patient and reviewed foods that contain carbohydrates. Reviewed with patient  how to read a food label.    Preventive Care:    Hemoglobin A1c: 11/15/2013 8.2    Colonoscopy: not yet. Primary care has suggested. Will follow through with now that he has the insurance.  Dilated Eye Exam: 08/19/2013  Flu vaccine: 04/19/2013 Status: RefusedPatient states that he does not want to get the flu shot this year.  Foot Exam: 11/15/2013  Lipid Panel: 11/15/2013  Prostate Exam 04/19/2013  Other Preventive Care Notes:  Mr. Dorvil has a great deal of dental work that needs to be addressed. He has only 2 good teeth.  This complicates his eating. He reports that it will cost him $1300 to complete the dental work that he needs.      Vital Signs:    09/05/2014 11:25 AM (EST)Blood Pressure 126 / 70 mm/Hg; Height 5 ft 11 in; Weight 220.6 lbs   Testing:  Blood Sugar Tests: Hemoglobin A1c: 9.6 via POCT resulted on 09/05/2014    Readiness to Change:  How important is your health to you? 10  How confident are you in working to improve your health? 10  How ready are you to change to improve your health? 10  Total Score: 10  Care Plan:  09/05/2014 4:53 PM (EST) (3)  Problem: Physical Inactivity  Role: Clinical Pharmacist  Long Term Goal Start walking around the mall three times a week for 30 minutes. 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  Date Started: 09/05/2014  09/05/2014 4:48 PM (EST) (2)  Problem: Overeating fruit  Role: Clinical Pharmacist  Long Term Goal Aim for 2-3 servings of fruit daily.  Date Started: 09/05/2014  09/05/2014 11:25 AM (EST) (1)  Problem: Patient not checking CBG  Role: Clinical Pharmacist  Long Term Goal Start checking CBG once daily in the evening  Date Started: 06/27/2014  No Goal Met He is checking a few times each week     Assessment/Plan: Patient is a 64 year old male with DM2. A1C today is 9.6% and is elevated above goal of less than 7%. I will send results to Dr. Lina Sar office with a recommendation to either start a short acting insulin or add a GLP-1 agonist such as Victoza or Byetta. Patient admits to worsening diet, especially over the holidays. He has also been eating 5-6 servings of fruit daily as a snack and this has likely contributed to worsening control. Reviewed with patient how to find nutrition information for foods, how to read a food label, how to count carbohydrates and what foods contained carbohdyrates. Also reviewed portion sizes for high carbohydrate foods in his diet, such as baked potatoes.  Patient is taking simvastatin and reports  he has been eating a lot of grapefruit lately. Due to the potential for a drug interaction, recommended to patient to abstain from grapfefruits.  Patient has not been exercising much lately due to a back injury. He thinks that he could start walking again. Encouraged patient to start walking three times a week. Follow up with patient in 6 weeks. Goals for Next Visit-  1. Ask Dr. Delfina Redwood about a prescription/order for diabetic shoes. 2. Stop eating grapefruit while you are taking simvastatin. 3. Start walking three days a week for 30 minutes at the mall. During your down time at work, walk around the warehouse. 4. Keep your appointment with Dr. Delfina Redwood later this month. 5. Limit fruit intake to 3 pieces a day. Next appointment to see me is Monday March 14th at 9:30 AM.

## 2014-10-25 NOTE — Progress Notes (Signed)
Patient ID: Timothy Peters, male   DOB: [birth year], 64 y.o.   MRN: [id number] ATTENDING PHYSICIAN NOTE: I have reviewed the chart and agree with the plan as detailed above. Dorcas Mcmurray MD Pager [phone]

## 2014-10-31 ENCOUNTER — Ambulatory Visit (INDEPENDENT_AMBULATORY_CARE_PROVIDER_SITE_OTHER): Payer: Self-pay | Admitting: Family Medicine

## 2014-10-31 VITALS — BP 136/70 | Wt 221.4 lb

## 2014-10-31 DIAGNOSIS — E1165 Type 2 diabetes mellitus with hyperglycemia: Secondary | ICD-10-CM

## 2014-10-31 NOTE — Assessment & Plan Note (Signed)
Subjective:  Patient presents today for 2 month diabetes follow-up as part of the employer-sponsored Link to Wellness program. Current diabetes regimen includes metformin 500 mg 2 tablets BID, Lantus 70 units SQ once daily, Januvia 100 mg daily. Patient also continues on daily ASA, ACEi, and statin. Most recent MD follow-up was a few weeks ago with Dr. Delfina Redwood. Patient has a pending appt for July 2016. No major health changes at this time. Lantus was increased to 70 units at his last visit with Dr. Delfina Redwood.  Patient states that he had bloodwork drawn but does not know what his most recent A1C was. Last A1C with [place] was 9.6%.    Disease Assessments:  Diabetes: Type of Diabetes: Type 2; Year of diagnosis 1999; Sees Diabetes provider 4 or more times per year; checks feet daily; Current Diabetes related medical conditions are High blood pressure, High cholesterol, Neuropathy; MD managing Diabetes Dr. Babs Sciara @ [place]; takes an aspirin a day; What is target A1c? 7.0 %; Diabetes Education Group class 2014 [facility]; hypoglycemia frequency rare; takes medications as prescribed;   checks blood glucose 2-6 times a week; uses glucometer True Result;   7 day CBG average 104; 14 day CBG average 117; 30 day CBG average 128; Highest CBG 208 285- christmas day  ; Lowest CBG 72;   Other Diabetes History:  Patient states he is checking at night, most days of the week. He admits that he still misses a few days during the week. This is an improvement from last visit. Lantus was increased 2 weeks ago to 70 units once daily (from 60 units). He denies hypoglycemia. CBGs have improved since last visit by 20-50 points.   He had A1C checked 2 weeks ago with Dr. Delfina Redwood and he does not know what the result was.         Social History:  Denies alcohol use; Medication adherence adherent; No drug use; Patient can afford medications; Diet adherence 25-50% of the time; Caffeine use:  uses sweet and low sweetener; Exercise adherence 3-4 days a week; 60 minutes of exercise per week.  Home Environment: stable  Occupation: [employer]   Physical Activity- He has an exercise bike and an elliptical machine. He is spending 15-20 minutes on the exercise bike at the end of the day. He is getting around 4 days a week of exercise.  He has been busier lately because he now has a second job painting houses.  Nutrition- Patient still only has a few remaining functional teeth.  He continues to eat fruit daily. He has been eating pears and some oranges.  At last visit we reviewed food labels. He states he has been looking at labels lately. He reports that he has been cutting back on portion sizes.    Preventive Care:    Hemoglobin A1c: 11/15/2013 8.2    Colonoscopy: not yet. Primary care has suggested. Will follow through with now that he has the insurance.  Dilated Eye Exam: 10/15/2013  Flu vaccine: 04/19/2013 Status: RefusedPatient states that he does not want to get the flu shot this year.  Foot Exam: 11/15/2013  Lipid Panel: 11/15/2013  Prostate Exam 04/19/2013  Other Preventive Care Notes:  Mr. Novosad has a great deal of dental work that needs to be addressed. He has only 2 good teeth. This complicates his eating. He reports that it will cost him $1300 to complete the dental work that he needs. Update 10-31-14- he is still  trying to save enough money to pay for the dental work he will need.      Overall Health Assessments:  Vision:  Dilated Eye Exam: 10/15/2013   Vital Signs:  10/31/2014 10:58 AM (EST)Blood Pressure 136 / 70 mm/HgBMI 30.9; Height 5 ft 11 in; Weight 221.4 lbs     Care Planning:  Learning Preference Assessment:  Learner: Patient  Readiness to Learn Barriers: None  Teaching Method: Demonstration, Explanation, Handout  Evaluation of Learning: Can function independently and verbalize knowledge  Readiness to Change:  How important is  your health to you? 10  How confident are you in working to improve your health? 10  How ready are you to change to improve your health? 10  Total Score: 10  Care Plan:  10/31/2014 10:58 AM (EST) (1)  Problem: Patient not checking CBG  Role: Clinical Pharmacist  Long Term Goal Start checking CBG once daily in the evening  Date Started: 06/27/2014  No Goal Met In the last couple of weeks he has been better about checking and is getting 4-5 days a week.  10/31/2014 10:57 AM (EST) (3)  Problem: Physical Inactivity  Role: Clinical Pharmacist  Long Term Goal Continue using the exercise bike at home in the evenings 3-4 days a week for 20 minutes. Start walking two days a week for 30 minutes.  Date Started: 09/05/2014  No Goal Met He has been using the exercise bike and elliptical machine at home a few days a week.  10/31/2014 10:25 AM (EST) (2)  Problem: Overeating fruit  Role: Clinical Pharmacist  Long Term Goal Aim for 2-3 servings of fruit daily.  Date Started: 09/05/2014  Goal Met He has cut back to eating 1-2 servings of fruit each day. Previously he was eating several servings of oranges each day.  Date Met: 10/31/2014     Assessment/Plan: Patient is a 64 year old male with DM2. Most recent A1C in January was 9.6% and is above goal of less than 7%. Since that A1C result patient has seen his physician Dr. Delfina Redwood who increased Lantus dose to 70 units SQ daily. He continues to take metformin and Januvia in addition to Lantus. Since increasing Lantus patient denies hypoglycemia. CBG averages are improved since last visit with me and 7 day CBG average is better than both 14 and 30 day average. Patient states that he has been trying to limit portion sizes and has cut back fruit intake to 1-2 servings daily. We reviewed again how to read food labels and used a food label to determine what breakfast option would be the best for him. He is still limited in what he can eat due to missing several of  his front teeth.  Follow up with patient in 2 months. I will contact Dr. Lina Sar office to see if they obtained A1C at his last visit with them.  Goals for Next Visit- 1. Keep checking your blood sugar once daily. Aim to have 7 blood sugar checks every week. 2. Breakfast ideas- try overnight oatmeal. Take 1/2 cup rolled oats and 1/2 milk and mix in a small bowl or jar. Refrigerate overnight and place in microwave in the morning. You can add cinnamon and splenda to help sweeten it. 3. Continue using the exercise bike at home in the evenings. Start walking again twice a week for 30 minutes. Next appointment to see me is Monday May 9th at 9 AM.

## 2014-11-21 NOTE — Progress Notes (Signed)
Patient ID: Timothy Peters, male   DOB: 1950-10-02, 64 y.o.   MRN: [id number] Reviewed: Agree with the documentation and management of our [place].

## 2014-12-26 ENCOUNTER — Ambulatory Visit: Payer: Self-pay | Admitting: Pharmacist

## 2015-01-13 ENCOUNTER — Encounter: Payer: Self-pay | Admitting: Pharmacist

## 2015-01-18 DIAGNOSIS — K8020 Calculus of gallbladder without cholecystitis without obstruction: Secondary | ICD-10-CM

## 2015-01-18 HISTORY — DX: Calculus of gallbladder without cholecystitis without obstruction: K80.20

## 2015-01-27 ENCOUNTER — Ambulatory Visit (INDEPENDENT_AMBULATORY_CARE_PROVIDER_SITE_OTHER): Payer: Self-pay | Admitting: Family Medicine

## 2015-01-27 ENCOUNTER — Ambulatory Visit: Payer: Self-pay | Admitting: Pharmacist

## 2015-01-27 VITALS — BP 152/84 | Ht 71.0 in | Wt 221.4 lb

## 2015-01-27 DIAGNOSIS — E1165 Type 2 diabetes mellitus with hyperglycemia: Secondary | ICD-10-CM

## 2015-01-27 LAB — POCT GLYCOSYLATED HEMOGLOBIN (HGB A1C): Hemoglobin A1C: 8.4

## 2015-01-27 NOTE — Progress Notes (Signed)
Subjective:  Patient presents today for 3 month diabetes follow-up as part of the employer-sponsored Link to Wellness program.  Current diabetes regimen includes Metformin 1000 mg BID, Januvia 100 mg daily, Lantus 70 units SQ once daily.   Patient also continues on daily ASA, ACE Inhibitor and statin.  Most recent MD follow-up was in February with Dr. Delfina Redwood. Patient has a pending appt for later this month. Patient does not recall what his last A1C was. I will check today. No med changes or major health changes at this time.   Patient recently lost his job and is looking for employment.    Assessment/Plan:  Patient is a 64 y.o. male with DM 2. A1C today was 8.4% which is at exceeding goal of less than 7%. Weight is stable from last visit with me.   CBG Review: Checking once daily in the evening. Most of the readings are between 80-150 mg/dL.   No hypoglycemia.    Lifestyle improvements:  Physical Activity-  He is not exercising as much because he was working overtime. 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   Nutrition-  B- eggs and biscuits(1) L/D- baked or boiled chicken; avoiding fried foods; still eating a lot of bread and potatoes. Drinking diet sodas.    Follow up with me in 2 months. I will send A1C results to Dr. Delfina Redwood.    Goals for Next Visit:  1. Eat breakfast at home. Ideas for food to eat- egg whites, oatmeal. Limit eating biscuits to once a week.  2. Increase physical activity to 3-5 times a week for 30-60 minutes. Ideas- riding bikes, walking, stair climber/elliptical machine.  3. Continue checking blood sugar once daily in the evening. Use the same machine for all of your blood sugar checks.     Next appointment to see me is: Monday August 8th at 9 AM.   Jinny Blossom D. Donneta Romberg, PharmD, BCPS, CDE [facility] [place] Coordinator [phone]

## 2015-02-07 NOTE — Progress Notes (Signed)
ATTENDING PHYSICIAN NOTE:Link to wellness Program I have reviewed the chart and agree with the plan as detailed above. Dorcas Mcmurray MD Pager [phone]

## 2015-02-10 ENCOUNTER — Ambulatory Visit
Admission: RE | Admit: 2015-02-10 | Discharge: 2015-02-10 | Disposition: A | Discharge status: Home / Self Care | Payer: 59 | Source: Ambulatory Visit | Attending: Internal Medicine | Admitting: Internal Medicine

## 2015-02-10 ENCOUNTER — Other Ambulatory Visit: Payer: Self-pay | Admitting: Internal Medicine

## 2015-02-10 DIAGNOSIS — R509 Fever, unspecified: Secondary | ICD-10-CM

## 2015-02-10 DIAGNOSIS — I2699 Other pulmonary embolism without acute cor pulmonale: Secondary | ICD-10-CM

## 2015-02-10 MED ORDER — IOPAMIDOL (ISOVUE-370) INJECTION 76%
100.0000 mL | Freq: Once | INTRAVENOUS | Status: AC | PRN
  Administered 2015-02-10: 100 mL via INTRAVENOUS

## 2015-03-27 ENCOUNTER — Ambulatory Visit: Payer: 59 | Admitting: Pharmacist

## 2015-04-06 ENCOUNTER — Ambulatory Visit (INDEPENDENT_AMBULATORY_CARE_PROVIDER_SITE_OTHER): Payer: 59 | Admitting: Cardiovascular Disease

## 2015-04-06 ENCOUNTER — Encounter: Payer: Self-pay | Admitting: Cardiovascular Disease

## 2015-04-06 VITALS — BP 118/70 | HR 71 | Ht 71.0 in | Wt 215.6 lb

## 2015-04-06 DIAGNOSIS — Z79899 Other long term (current) drug therapy: Secondary | ICD-10-CM

## 2015-04-06 DIAGNOSIS — Z8249 Family history of ischemic heart disease and other diseases of the circulatory system: Secondary | ICD-10-CM | POA: Diagnosis not present

## 2015-04-06 DIAGNOSIS — E109 Type 1 diabetes mellitus without complications: Secondary | ICD-10-CM

## 2015-04-06 DIAGNOSIS — R0602 Shortness of breath: Secondary | ICD-10-CM | POA: Diagnosis not present

## 2015-04-06 NOTE — Patient Instructions (Addendum)
Your physician has requested that you have an exercise tolerance test. For further information please visit [email or web address]. Please also follow instruction sheet, as given. ( exercise myoview - TK)  Your physician has requested that you have an echocardiogram. Echocardiography is a painless test that uses sound waves to create images of your heart. It provides your doctor with information about the size and shape of your heart and how well your heart's chambers and valves are working. This procedure takes approximately one hour. There are no restrictions for this procedure.  Your physician recommends that you return for lab work fasting.  Your physician recommends that you schedule a follow-up appointment pending tests results.

## 2015-04-07 LAB — COMPREHENSIVE METABOLIC PANEL
ALT: 25 U/L (ref 9–46)
AST: 24 U/L (ref 10–35)
Albumin: 3.8 g/dL (ref 3.6–5.1)
Alkaline Phosphatase: 64 U/L (ref 40–115)
BUN: 20 mg/dL (ref 7–25)
CO2: 26 mmol/L (ref 20–31)
Calcium: 9.2 mg/dL (ref 8.6–10.3)
Chloride: 102 mmol/L (ref 98–110)
Creat: 1.03 mg/dL (ref 0.70–1.25)
Glucose, Bld: 233 mg/dL — ABNORMAL HIGH (ref 65–99)
Potassium: 4.3 mmol/L (ref 3.5–5.3)
Sodium: 137 mmol/L (ref 135–146)
Total Bilirubin: 0.5 mg/dL (ref 0.2–1.2)
Total Protein: 7.9 g/dL (ref 6.1–8.1)

## 2015-04-07 LAB — CBC
HCT: 41.7 % (ref 39.0–52.0)
Hemoglobin: 13.6 g/dL (ref 13.0–17.0)
MCH: 28.9 pg (ref 26.0–34.0)
MCHC: 32.6 g/dL (ref 30.0–36.0)
MCV: 88.5 fL (ref 78.0–100.0)
MPV: 11.3 fL (ref 8.6–12.4)
Platelets: 230 10*3/uL (ref 150–400)
RBC: 4.71 MIL/uL (ref 4.22–5.81)
RDW: 12.8 % (ref 11.5–15.5)
WBC: 7.1 10*3/uL (ref 4.0–10.5)

## 2015-04-07 LAB — HEMOGLOBIN A1C
Hgb A1c MFr Bld: 12.1 % — ABNORMAL HIGH (ref <5.7)
Mean Plasma Glucose: 301 mg/dL — ABNORMAL HIGH (ref <117)

## 2015-04-07 LAB — LIPID PANEL
Cholesterol: 137 mg/dL (ref 125–200)
HDL: 36 mg/dL — ABNORMAL LOW (ref >=40)
LDL Cholesterol: 46 mg/dL (ref <130)
Total CHOL/HDL Ratio: 3.8 Ratio (ref <=5.0)
Triglycerides: 277 mg/dL — ABNORMAL HIGH (ref <150)
VLDL: 55 mg/dL — ABNORMAL HIGH (ref <30)

## 2015-04-07 LAB — TSH: TSH: 1.288 u[IU]/mL (ref 0.350–4.500)

## 2015-04-08 ENCOUNTER — Encounter: Payer: Self-pay | Admitting: Cardiovascular Disease

## 2015-04-08 NOTE — Progress Notes (Signed)
Patient ID: Timothy Peters, male   DOB: 1951-05-05, 64 y.o.   MRN: [id number]     Primary MD: Dr. Delfina Redwood  PATIENT PROFILE: Timothy Peters is a 64 y.o. male who is referred through the courtesy of Dr. polite for evaluation of exertional shortness of breath and mild dizziness.   HPI:  Timothy Peters has a long-standing history of type 2 diabetes mellitus and has been on treatment since 1999.  He also has a similar history for hypertension.  He also has a history of hyperlipidemia as well as peripheral neuropathy.  Recently, he has begun to notice some chest discomfort in the rib cage region.  He also has noticed several episodes of dizziness with heat.  He has begun to notice a definite change in his development of shortness of breath with activity.  He is referred by Dr. Delfina Redwood for Cardiologic evaluation.  He denies palpitations.  He denies PND, orthopnea.  He denies syncope.  Past Medical History  Diagnosis Date  . Diabetes mellitus without complication   . Hypertension   . Hyperlipidemia     Past Surgical History  Procedure Laterality Date  . Nerve damage left arm  1990    No Known Allergies  Current Outpatient Prescriptions  Medication Sig Dispense Refill  . aspirin 81 MG tablet Take 81 mg by mouth daily.    . enalapril (VASOTEC) 2.5 MG tablet Take 2.5 mg by mouth daily.    Marland Kitchen gabapentin (NEURONTIN) 100 MG capsule Take 100 mg by mouth at bedtime as needed (pain).     Marland Kitchen glucose blood test strip Use as instructed 100 each 12  . insulin glargine (LANTUS) 100 UNIT/ML injection Inject 70 Units into the skin at bedtime.     . Insulin Pen Needle 31G X 8 MM MISC Use as directed for daily insulin 100 each 3  . metFORMIN (GLUCOPHAGE) 500 MG tablet Take 1,000 mg by mouth 2 (two) times daily with a meal.    . Multiple Vitamins-Minerals (MULTIVITAMIN WITH MINERALS) tablet Take 1 tablet by mouth daily.    . sildenafil (VIAGRA) 100 MG tablet Take 100 mg by mouth daily as needed for  erectile dysfunction.    . simvastatin (ZOCOR) 20 MG tablet Take 20 mg by mouth daily.    . sitaGLIPtin (JANUVIA) 100 MG tablet Take 100 mg by mouth daily.     No current facility-administered medications for this visit.    Social History   Social History  . Marital Status: Married    Spouse Name: N/A  . Number of Children: N/A  . Years of Education: N/A   Occupational History  . Not on file.   Social History Main Topics  . Smoking status: Former Smoker    Quit date: 11/30/2007  . Smokeless tobacco: Not on file  . Alcohol Use: No  . Drug Use: No  . Sexual Activity: Not on file   Other Topics Concern  . Not on file   Social History Narrative   Additional social history is notable that he was born in [place].  He currently is in his second marriage for 15 years.  He has 3 children, 3 grandchildren, and one great-grandchild.  He previously had worked [employer].  He has a high school graduation via a GED program.  There is a remote 40 year history of tobacco use but he quit in 2004.  He does not drink alcohol.  He does not routinely exercise.  Family History  Problem  Relation Age of Onset  . Hypertension Mother   . Stroke Mother   . Heart attack Father   . Cancer Sister   . Cancer Brother   . Diabetes Other   . Hypertension Other   . Hyperlipidemia Other   . Heart attack Maternal Grandmother   . Hypertension Maternal Grandfather   . Stroke Paternal Grandmother   . Stroke Paternal Grandfather    Additional family history is notable that his mother is still living at age 57 and has a history of hypertension.  Father died at age 64 and had suffered a myocardial infarction.  One of his 4 brothers is deceased secondary to brain cancer.  One of his 3 sisters is deceased secondary to brain cancer.  ROS General: Negative; No fevers, chills, or night sweats HEENT: Negative; No changes in vision or hearing, sinus congestion, difficulty swallowing Pulmonary: Negative; No cough,  wheezing, shortness of breath, hemoptysis Cardiovascular:  See HPI;  GI: Negative; No nausea, vomiting, diarrhea, or abdominal pain GU: Negative; No dysuria, hematuria, or difficulty voiding Musculoskeletal: Negative; no myalgias, joint pain, or weakness Hematologic/Oncologic: Negative; no easy bruising, bleeding Endocrine: Negative; no heat/cold intolerance; no diabetes Neuro: Negative; no changes in balance, headaches Skin: Negative; No rashes or skin lesions Psychiatric: Negative; No behavioral problems, depression Sleep: Negative; No daytime sleepiness, hypersomnolence, bruxism, restless legs, hypnogagnic hallucinations Other comprehensive 14 point system review is negative   Physical Exam BP 118/70 mmHg  Pulse 71  Ht '5\' 11"'  (1.803 m)  Wt 215 lb 9.6 oz (97.796 kg)  BMI 30.08 kg/m2  Wt Readings from Last 3 Encounters:  04/06/15 215 lb 9.6 oz (97.796 kg)  01/27/15 221 lb 6.4 oz (100.426 kg)  10/31/14 221 lb 6.4 oz (100.426 kg)   General: Alert, oriented, no distress.  Skin: normal turgor, no rashes, warm and dry HEENT: Normocephalic, atraumatic. Pupils equal round and reactive to light; sclera anicteric; extraocular muscles intact; Fundi mild vessel tortuosity, no hemorrhages or exudates Nose without nasal septal hypertrophy Mouth/Parynx benign; Mallinpatti scale 3 Neck: No JVD, no carotid bruits; normal carotid upstroke Lungs: clear to ausculatation and percussion; no wheezing or rales Chest wall: without tenderness to palpitation Heart: PMI not displaced, RRR, s1 s2 normal, 1/6 systolic murmur, no diastolic murmur, no rubs, gallops, thrills, or heaves Abdomen: soft, nontender; no hepatosplenomehaly, BS+; abdominal aorta nontender and not dilated by palpation. Back: no CVA tenderness Pulses 2+; soft femoral bruits. Musculoskeletal: full range of motion, normal strength, no joint deformities Extremities: no clubbing cyanosis or edema, Homan's sign negative  Neurologic:  grossly nonfocal; Cranial nerves grossly wnl Psychologic: Normal mood and affect   ECG (independently read by me): Normal sinus rhythm at 71 bpm.  Normal intervals.  No ST segment changes.  LABS:  BMP Latest Ref Rng 04/06/2015 10/30/2011  Glucose 65 - 99 mg/dL 233(H) 72  BUN 7 - 25 mg/dL 20 16  Creatinine 0.70 - 1.25 mg/dL 1.03 0.96  Sodium 135 - 146 mmol/L 137 141  Potassium 3.5 - 5.3 mmol/L 4.3 4.6  Chloride 98 - 110 mmol/L 102 103  CO2 20 - 31 mmol/L 26 25  Calcium 8.6 - 10.3 mg/dL 9.2 10.2     Hepatic Function Latest Ref Rng 04/06/2015 10/30/2011  Total Protein 6.1 - 8.1 g/dL 7.9 8.1  Albumin 3.6 - 5.1 g/dL 3.8 4.4  AST 10 - 35 U/L 24 28  ALT 9 - 46 U/L 25 19  Alk Phosphatase 40 - 115 U/L 64 80  Total  Bilirubin 0.2 - 1.2 mg/dL 0.5 0.6    CBC Latest Ref Rng 04/06/2015 10/30/2011  WBC 4.0 - 10.5 K/uL 7.1 10.2  Hemoglobin 13.0 - 17.0 g/dL 13.6 14.8  Hematocrit 39.0 - 52.0 % 41.7 45.2  Platelets 150 - 400 K/uL 230 311   Lab Results  Component Value Date   MCV 88.5 04/06/2015   MCV 89.9 10/30/2011   Lab Results  Component Value Date   TSH 1.288 04/06/2015   Lab Results  Component Value Date   HGBA1C 12.1* 04/06/2015     BNP No results found for: BNP  ProBNP No results found for: PROBNP   Lipid Panel     Component Value Date/Time   CHOL 137 04/06/2015 0941   TRIG 277* 04/06/2015 0941   HDL 36* 04/06/2015 0941   CHOLHDL 3.8 04/06/2015 0941   VLDL 55* 04/06/2015 0941   LDLCALC 46 04/06/2015 0941    RADIOLOGY: No results found.   ASSESSMENT AND PLAN: Mr. Sabastien Tyler is a 64 year old African-American male who has a cardiac risk factor profile notable for at least a 17 year history of hypertension, with diabetes mellitus, a history of hyperlipidemia, and remote tobacco use of 40 years.  He also has had issues with peripheral neuropathy.  Presently, his blood pressure today is controlled on his low-dose ACE inhibitor with Vasotec 2.5 mg daily.  He is  diabetic on metformin and insulin.  He has been taking gabapentin for his peripheral neuropathy.  He been taking simvastatin 20 mg for his hyperlipidemia.  He has noticed a definite change with increasing shortness of breath with activity and also notes some atypical rib cage discomfort.  I am scheduling him for an echo Doppler study to assess both systolic and diastolic function and valvular architecture.  I am scheduling him for an exercise nuclear stress test, particularly with his diabetes mellitus to make certain his exertional dyspnea is not ischemia mediated.  I will check a complete set of laboratory on his current therapy including hemoglobin A1c.  Target LDL is less than 70 in this diabetic male.  I will see him in 6 weeks for cardiology reevaluation.   Troy Sine, MD, [facility] 04/08/2015 10:08 AM

## 2015-04-10 ENCOUNTER — Ambulatory Visit: Payer: 59 | Admitting: Pharmacist

## 2015-04-10 ENCOUNTER — Other Ambulatory Visit: Payer: Self-pay | Admitting: *Deleted

## 2015-04-10 ENCOUNTER — Ambulatory Visit (INDEPENDENT_AMBULATORY_CARE_PROVIDER_SITE_OTHER): Payer: Self-pay | Admitting: Family Medicine

## 2015-04-10 VITALS — BP 126/78 | Ht 71.0 in | Wt 216.4 lb

## 2015-04-10 DIAGNOSIS — E785 Hyperlipidemia, unspecified: Secondary | ICD-10-CM

## 2015-04-10 DIAGNOSIS — E1165 Type 2 diabetes mellitus with hyperglycemia: Secondary | ICD-10-CM

## 2015-04-10 DIAGNOSIS — I10 Essential (primary) hypertension: Secondary | ICD-10-CM

## 2015-04-10 DIAGNOSIS — Z8249 Family history of ischemic heart disease and other diseases of the circulatory system: Secondary | ICD-10-CM

## 2015-04-10 DIAGNOSIS — R0602 Shortness of breath: Secondary | ICD-10-CM

## 2015-04-10 NOTE — Progress Notes (Signed)
Subjective:  Patient presents today for 2 month diabetes follow-up as part of the employer-sponsored Link to Wellness program.  Current diabetes regimen includes Lantus 80 units SQ once daily, Januvia 100 mg daily, Metformin 1000 mg BID. Patient also continues on daily ASA, ACE Inhibitor and statin.    Patient reports that he has not been doing well. He has had SOB on exertion and is being evaluated by a cardiologist. He has an ECHO pending for Thursday and a stress test pending for a week from tomorrow.   Last visit with Dr. Delfina Redwood was the 15th of this month. He states that A1C was elevated but he does not know the actual result. He reports that Dr. Delfina Redwood increased Lantus dose 80 units. I will request A1C from Dr. Lina Sar office.    Assessment/Plan:  Patient is a 64 y.o. male with DM 2. Most recent A1C is 12.1% and is exceeding goal of less than 7%. Weight is stable from last visit with me.   Given the recent increase in A1C I believe that patient likely needs meal time coverage in addition to Lantus. Dr. Delfina Redwood recently increased Lantus dose but I do not feel it will be enough to bring patient to goal.   CBG Review: Patient forgot to bring his meter with him to the appointment.   He is checking once daily in the evening, usually before dinner. He reports that lately it has been running in the 200s-300s. He reports that when it is running in the 300s was before he switched his Lantus dose (previously was 36 units BID). He also admits that his eating habits were also to blame.   Denies hypoglycemia.   Lifestyle improvements:  Physical Activity-  He has not been doing physical activity because he has been having SOB and dizziness on exertion. Previously he had been walking and bicycle riding.    Nutrition-  He reports that he has been snacking more lately. He is not working full time and he is eating from stress and boredom.   He has been snacking more at night and eating cereal.    Still eating fruit but not as much as before. Eating grapes, bananas and pears.   One of his goals last time was to cut back to biscuits to once a week. He states that he has done better with limiting biscuits.   Eating ice cream twice a week. I reviewed with patient healthier options to replace ice cream such as sugar free pudding and mini ice cream sandwiches.    I explained to patient that he needed to make some serious changes in order to get blood sugar at goal and to reduce his risk of cardiovascular event. We reviewed carbohydrate counting and healthier snack options.       Follow up with me in 6 weeks. I will send a fax to Dr. Delfina Redwood recommending intitiation of meal time insulin.    Goals for Next Visit:  1. Cut out late night snacking. If you do absolutely have to eat something, try the sugar free jello or a slice of cheese. Look for the 2% American cheese or the mozarrella part skim milk cheese. These will be lower in fat.  2. Eliminate eating potato chips. If you need a snack, reach for a small piece of fruit, lowfat cheese, sugar free jello or pudding.  3. Alternate ideas for ice cream during the week- sugar free pudding cup, mini ice cream sandwiches.    Next appointment to see  me is: Monday October 3rd at 9 AM.    Truett Mainland. Donneta Romberg, PharmD, BCPS, CDE [facility] [place] Coordinator [phone]

## 2015-04-11 ENCOUNTER — Telehealth: Payer: Self-pay | Admitting: *Deleted

## 2015-04-11 ENCOUNTER — Other Ambulatory Visit: Payer: Self-pay | Admitting: *Deleted

## 2015-04-11 MED ORDER — ICOSAPENT ETHYL 1 G PO CAPS: 2.0000 | ORAL_CAPSULE | Freq: Two times a day (BID) | ORAL | Status: DC

## 2015-04-11 NOTE — Telephone Encounter (Signed)
-----   Message from Troy Sine, MD sent at 04/10/2015  1:48 PM EDT ----- Copy to Dr. Delfina Redwood for improved DM Rx.  Add vascepa 2 capsules bid to statin.

## 2015-04-13 ENCOUNTER — Telehealth (HOSPITAL_COMMUNITY): Payer: Self-pay

## 2015-04-13 ENCOUNTER — Other Ambulatory Visit: Payer: Self-pay

## 2015-04-13 ENCOUNTER — Ambulatory Visit (HOSPITAL_COMMUNITY): Payer: 59 | Attending: Cardiology

## 2015-04-13 DIAGNOSIS — Z87891 Personal history of nicotine dependence: Secondary | ICD-10-CM | POA: Diagnosis not present

## 2015-04-13 DIAGNOSIS — I371 Nonrheumatic pulmonary valve insufficiency: Secondary | ICD-10-CM | POA: Insufficient documentation

## 2015-04-13 DIAGNOSIS — I071 Rheumatic tricuspid insufficiency: Secondary | ICD-10-CM | POA: Diagnosis not present

## 2015-04-13 DIAGNOSIS — R0602 Shortness of breath: Secondary | ICD-10-CM

## 2015-04-13 DIAGNOSIS — Z8249 Family history of ischemic heart disease and other diseases of the circulatory system: Secondary | ICD-10-CM | POA: Insufficient documentation

## 2015-04-13 DIAGNOSIS — E119 Type 2 diabetes mellitus without complications: Secondary | ICD-10-CM | POA: Insufficient documentation

## 2015-04-13 DIAGNOSIS — I77819 Aortic ectasia, unspecified site: Secondary | ICD-10-CM | POA: Diagnosis not present

## 2015-04-13 DIAGNOSIS — E785 Hyperlipidemia, unspecified: Secondary | ICD-10-CM | POA: Diagnosis not present

## 2015-04-13 DIAGNOSIS — I340 Nonrheumatic mitral (valve) insufficiency: Secondary | ICD-10-CM | POA: Insufficient documentation

## 2015-04-13 DIAGNOSIS — I10 Essential (primary) hypertension: Secondary | ICD-10-CM | POA: Insufficient documentation

## 2015-04-13 DIAGNOSIS — I517 Cardiomegaly: Secondary | ICD-10-CM | POA: Diagnosis not present

## 2015-04-13 DIAGNOSIS — I5189 Other ill-defined heart diseases: Secondary | ICD-10-CM | POA: Diagnosis not present

## 2015-04-13 NOTE — Telephone Encounter (Signed)
Encounter complete. 

## 2015-04-18 ENCOUNTER — Ambulatory Visit (HOSPITAL_COMMUNITY)
Admission: RE | Admit: 2015-04-18 | Discharge: 2015-04-18 | Disposition: A | Discharge status: Home / Self Care | Payer: 59 | Source: Ambulatory Visit | Attending: Cardiology | Admitting: Cardiology

## 2015-04-18 DIAGNOSIS — I10 Essential (primary) hypertension: Secondary | ICD-10-CM | POA: Diagnosis not present

## 2015-04-18 DIAGNOSIS — E119 Type 2 diabetes mellitus without complications: Secondary | ICD-10-CM | POA: Insufficient documentation

## 2015-04-18 DIAGNOSIS — E785 Hyperlipidemia, unspecified: Secondary | ICD-10-CM | POA: Diagnosis not present

## 2015-04-18 DIAGNOSIS — R9439 Abnormal result of other cardiovascular function study: Secondary | ICD-10-CM | POA: Insufficient documentation

## 2015-04-18 DIAGNOSIS — R42 Dizziness and giddiness: Secondary | ICD-10-CM | POA: Insufficient documentation

## 2015-04-18 DIAGNOSIS — R079 Chest pain, unspecified: Secondary | ICD-10-CM | POA: Diagnosis not present

## 2015-04-18 DIAGNOSIS — Z8249 Family history of ischemic heart disease and other diseases of the circulatory system: Secondary | ICD-10-CM | POA: Diagnosis not present

## 2015-04-18 DIAGNOSIS — Z87891 Personal history of nicotine dependence: Secondary | ICD-10-CM | POA: Insufficient documentation

## 2015-04-18 DIAGNOSIS — R0609 Other forms of dyspnea: Secondary | ICD-10-CM | POA: Insufficient documentation

## 2015-04-18 LAB — MYOCARDIAL PERFUSION IMAGING
Estimated workload: 10.1 METS
Exercise duration (min): 8 min
Exercise duration (sec): 30 s
LV dias vol: 132 mL
LV sys vol: 72 mL
MPHR: 157 {beats}/min
Peak HR: 155 {beats}/min
Percent HR: 98 %
RPE: 17
Rest HR: 64 {beats}/min
SRS: 1
SSS: 1
TID: 1.25

## 2015-04-18 MED ORDER — TECHNETIUM TC 99M SESTAMIBI GENERIC - CARDIOLITE
10.2000 | Freq: Once | INTRAVENOUS | Status: AC | PRN
  Administered 2015-04-18: 10 via INTRAVENOUS

## 2015-04-18 MED ORDER — TECHNETIUM TC 99M SESTAMIBI GENERIC - CARDIOLITE
30.4000 | Freq: Once | INTRAVENOUS | Status: AC | PRN
  Administered 2015-04-18: 30 via INTRAVENOUS

## 2015-04-21 ENCOUNTER — Telehealth: Payer: Self-pay | Admitting: Cardiovascular Disease

## 2015-04-21 NOTE — Telephone Encounter (Signed)
Returning your call. °

## 2015-04-26 NOTE — Progress Notes (Signed)
ATTENDING PHYSICIAN NOTE: I have reviewed the chart and agree with the plan as detailed above. Sara Neal MD Pager [phone]  

## 2015-05-02 ENCOUNTER — Encounter (HOSPITAL_COMMUNITY): Payer: 59

## 2015-05-08 ENCOUNTER — Telehealth: Payer: Self-pay | Admitting: Cardiovascular Disease

## 2015-05-08 NOTE — Telephone Encounter (Signed)
Patient called and notified of results. Stress test was released to MyChart but echo did not have a result note from clinic member where patient was contacted. Patient voiced understanding of test results. Patient asked about follow up of lab results - vascepa was added due to elevated TG. Patient scheduled to see Dr. Claiborne Billings in October (per AVS, follow up was pending test results). Patient will keep follow up with review all testing in further detail and discuss follow up at this time.

## 2015-05-08 NOTE — Telephone Encounter (Signed)
Pt would like his stress test results from 04-18-15 and echo results from 04-13-15 please.

## 2015-05-22 ENCOUNTER — Ambulatory Visit: Payer: 59 | Admitting: Pharmacist

## 2015-06-01 ENCOUNTER — Ambulatory Visit (INDEPENDENT_AMBULATORY_CARE_PROVIDER_SITE_OTHER): Payer: 59 | Admitting: Cardiovascular Disease

## 2015-06-01 ENCOUNTER — Encounter: Payer: Self-pay | Admitting: Cardiovascular Disease

## 2015-06-01 VITALS — BP 130/78 | HR 69 | Ht 71.0 in | Wt 226.0 lb

## 2015-06-01 DIAGNOSIS — E119 Type 2 diabetes mellitus without complications: Secondary | ICD-10-CM

## 2015-06-01 DIAGNOSIS — I10 Essential (primary) hypertension: Secondary | ICD-10-CM

## 2015-06-01 DIAGNOSIS — E785 Hyperlipidemia, unspecified: Secondary | ICD-10-CM | POA: Diagnosis not present

## 2015-06-01 DIAGNOSIS — Z79899 Other long term (current) drug therapy: Secondary | ICD-10-CM

## 2015-06-01 DIAGNOSIS — R0602 Shortness of breath: Secondary | ICD-10-CM

## 2015-06-01 DIAGNOSIS — Z794 Long term (current) use of insulin: Secondary | ICD-10-CM

## 2015-06-01 MED ORDER — ENALAPRIL MALEATE 5 MG PO TABS: 5.0000 mg | ORAL_TABLET | Freq: Every day | ORAL | Status: DC

## 2015-06-01 MED ORDER — METOPROLOL SUCCINATE ER 25 MG PO TB24: 25.0000 mg | ORAL_TABLET | Freq: Every day | ORAL | Status: DC

## 2015-06-01 NOTE — Patient Instructions (Signed)
Your physician has recommended you make the following change in your medication: the enalapril has been increased to 5 mg daily. A new prescription has been sent to your pharmacy. A new prescription for metoprolol succ has also been sent to the pharmacy.  Your physician recommends that you return for lab work in: 2 months.  Your physician recommends that you schedule a follow-up appointment in: 3 months.

## 2015-06-03 ENCOUNTER — Encounter: Payer: Self-pay | Admitting: Cardiovascular Disease

## 2015-06-03 NOTE — Progress Notes (Signed)
Patient ID: Timothy Peters, male   DOB: May 14, 1951, 64 y.o.   MRN: [id number]     Primary MD: Dr. Delfina Peters  HPI: Timothy Peters is a 64 y.o. male who I saw for initial cardiology evaluation 2 months ago.  He presents for follow-up evaluation.  Timothy. Meech has a long-standing history of type 2 diabetes mellitus and has been on treatment since 1999.  He has a history for hypertension, hyperlipidemia as well as peripheral neuropathy.  Recently,began to notice some chest discomfort in the rib cage region.  He also has noticed several episodes of dizziness with heat.  He has begun to notice a definite change in his development of shortness of breath with activity.  He was referred by Dr. Delfina Peters for Cardiologic evaluation  2 months ago.   He has undergone an echo Doppler study which was done on 04/13/2015.  This revealed normal systolic function with mild focal basal hypertrophy of the septum.  Ejection fraction was 55-60%.  There was grade 2 diastolic dysfunction.  There was mild dilatation of his aortic root,right ventricle, and right atrium.  A nuclear perfusion study was low risk.  He developed asymptomatic ST depression laterally.  There was mild inferior thinning without ischemia.  Ejection fraction was 46% post stress.  Laboratory in August 2016 revealed significant triglyceride elevation at 277, and VLDL at 55.  Total cholesterol was 137 and LDL was 46. I added for Vascepa 2 capsules twice a day to his current dose of simvastatin 20 mg.  He has felt improved with this and seems to admit to having more energy and being less sluggish. He has been taking enalapril 2.5 mg daily in addition to his diabetic medications including Januvia, metformin and insulin. He is on Neurontin for peripheral neuropathy.  Laboratory in August revealed poorly controlled diabetes with a hemoglobin A1c of 12.1.  Dr. Delfina Peters is following his diabetes mellitus for medication changes.  He denies palpitations.  He denies PND,  orthopnea.  He denies syncope.  He presents for reevaluation.  Past Medical History  Diagnosis Date  . Diabetes mellitus without complication (Timothy Peters)   . Hypertension   . Hyperlipidemia     Past Surgical History  Procedure Laterality Date  . Nerve damage left arm  1990    No Known Allergies  Current Outpatient Prescriptions  Medication Sig Dispense Refill  . aspirin 81 MG tablet Take 81 mg by mouth daily.    Timothy Peters gabapentin (NEURONTIN) 100 MG capsule Take 100 mg by mouth at bedtime as needed (pain).     Timothy Peters glucose blood test strip Use as instructed 100 each 12  . Icosapent Ethyl (VASCEPA) 1 G CAPS Take 2 capsules by mouth 2 (two) times daily. 120 capsule 11  . insulin glargine (LANTUS) 100 UNIT/ML injection Inject 80 Units into the skin at bedtime.     . Insulin Pen Needle 31G X 8 MM MISC Use as directed for daily insulin 100 each 3  . metFORMIN (GLUCOPHAGE) 500 MG tablet Take 1,000 mg by mouth 2 (two) times daily with a meal.    . Multiple Vitamins-Minerals (MULTIVITAMIN WITH MINERALS) tablet Take 1 tablet by mouth daily.    . sildenafil (VIAGRA) 100 MG tablet Take 100 mg by mouth daily as needed for erectile dysfunction.    . simvastatin (ZOCOR) 20 MG tablet Take 20 mg by mouth daily.    . sitaGLIPtin (JANUVIA) 100 MG tablet Take 100 mg by mouth daily.    Timothy Peters  enalapril (VASOTEC) 5 MG tablet Take 1 tablet (5 mg total) by mouth daily. 30 tablet 6  . metoprolol succinate (TOPROL XL) 25 MG 24 hr tablet Take 1 tablet (25 mg total) by mouth daily. 30 tablet 6   No current facility-administered medications for this visit.    Social History   Social History  . Marital Status: Married    Spouse Name: N/A  . Number of Children: N/A  . Years of Education: N/A   Occupational History  . Not on file.   Social History Main Topics  . Smoking status: Former Smoker    Quit date: 11/30/2007  . Smokeless tobacco: Not on file  . Alcohol Use: No  . Drug Use: No  . Sexual Activity: Not on file    Other Topics Concern  . Not on file   Social History Narrative   Additional social history is notable that he was born in [place].  He currently is in his second marriage for 15 years.  He has 3 children, 3 grandchildren, and one great-grandchild.  He previously had worked [employer].  He has a high school graduation via a GED program.  There is a remote 40 year history of tobacco use but he quit in 2004.  He does not drink alcohol.  He does not routinely exercise.  Family History  Problem Relation Age of Onset  . Hypertension Mother   . Stroke Mother   . Heart attack Father   . Cancer Sister   . Cancer Brother   . Diabetes Other   . Hypertension Other   . Hyperlipidemia Other   . Heart attack Maternal Grandmother   . Hypertension Maternal Grandfather   . Stroke Paternal Grandmother   . Stroke Paternal Grandfather    Additional family history is notable that his mother is still living at age 56 and has a history of hypertension.  Father died at age 41 and had suffered a myocardial infarction.  One of his 4 brothers is deceased secondary to brain cancer.  One of his 3 sisters is deceased secondary to brain cancer.  ROS General: Negative; No fevers, chills, or night sweats HEENT: Negative; No changes in vision or hearing, sinus congestion, difficulty swallowing Pulmonary: Negative; No cough, wheezing, shortness of breath, hemoptysis Cardiovascular:  See HPI;  GI: Negative; No nausea, vomiting, diarrhea, or abdominal pain GU: Negative; No dysuria, hematuria, or difficulty voiding Musculoskeletal: Negative; no myalgias, joint pain, or weakness Hematologic/Oncologic: Negative; no easy bruising, bleeding Endocrine:  Positive for diabetes mellitus Neuro: Negative; no changes in balance, headaches Skin: Negative; No rashes or skin lesions Psychiatric: Negative; No behavioral problems, depression Sleep: Negative; No daytime sleepiness, hypersomnolence, bruxism, restless legs, hypnogagnic  hallucinations Other comprehensive 14 point system review is negative   Physical Exam BP 130/78 mmHg  Pulse 69  Ht '5\' 11"'  (1.803 m)  Wt 226 lb (102.513 kg)  BMI 31.53 kg/m2  Wt Readings from Last 3 Encounters:  06/01/15 226 lb (102.513 kg)  04/18/15 216 lb (97.977 kg)  04/10/15 216 lb 6.4 oz (98.158 kg)   General: Alert, oriented, no distress.  Skin: normal turgor, no rashes, warm and dry HEENT: Normocephalic, atraumatic. Pupils equal round and reactive to light; sclera anicteric; extraocular muscles intact; Fundi mild vessel tortuosity, no hemorrhages or exudates Nose without nasal septal hypertrophy Mouth/Parynx benign; Mallinpatti scale 3 Neck: No JVD, no carotid bruits; normal carotid upstroke Lungs: clear to ausculatation and percussion; no wheezing or rales Chest wall: without tenderness to  palpitation Heart: PMI not displaced, RRR, s1 s2 normal, 1/6 systolic murmur, no diastolic murmur, no rubs, gallops, thrills, or heaves Abdomen: soft, nontender; no hepatosplenomehaly, BS+; abdominal aorta nontender and not dilated by palpation. Back: no CVA tenderness Pulses 2+; soft femoral bruits. Musculoskeletal: full range of motion, normal strength, no joint deformities Extremities: no clubbing cyanosis or edema, Homan's sign negative  Neurologic: grossly nonfocal; Cranial nerves grossly wnl Psychologic: Normal mood and affect  ECG (independently read by me):  Normal sinus rhythm at 69 bpm.  No ectopy.  Normal intervals.   August 2016 ECG (independently read by me): Normal sinus rhythm at 71 bpm.  Normal intervals.  No ST segment changes.  LABS:  BMP Latest Ref Rng 04/06/2015 10/30/2011  Glucose 65 - 99 mg/dL 233(H) 72  BUN 7 - 25 mg/dL 20 16  Creatinine 0.70 - 1.25 mg/dL 1.03 0.96  Sodium 135 - 146 mmol/L 137 141  Potassium 3.5 - 5.3 mmol/L 4.3 4.6  Chloride 98 - 110 mmol/L 102 103  CO2 20 - 31 mmol/L 26 25  Calcium 8.6 - 10.3 mg/dL 9.2 10.2     Hepatic Function  Latest Ref Rng 04/06/2015 10/30/2011  Total Protein 6.1 - 8.1 g/dL 7.9 8.1  Albumin 3.6 - 5.1 g/dL 3.8 4.4  AST 10 - 35 U/L 24 28  ALT 9 - 46 U/L 25 19  Alk Phosphatase 40 - 115 U/L 64 80  Total Bilirubin 0.2 - 1.2 mg/dL 0.5 0.6    CBC Latest Ref Rng 04/06/2015 10/30/2011  WBC 4.0 - 10.5 K/uL 7.1 10.2  Hemoglobin 13.0 - 17.0 g/dL 13.6 14.8  Hematocrit 39.0 - 52.0 % 41.7 45.2  Platelets 150 - 400 K/uL 230 311   Lab Results  Component Value Date   MCV 88.5 04/06/2015   MCV 89.9 10/30/2011   Lab Results  Component Value Date   TSH 1.288 04/06/2015   Lab Results  Component Value Date   HGBA1C 12.1* 04/06/2015     BNP No results found for: BNP  ProBNP No results found for: PROBNP   Lipid Panel     Component Value Date/Time   CHOL 137 04/06/2015 0941   TRIG 277* 04/06/2015 0941   HDL 36* 04/06/2015 0941   CHOLHDL 3.8 04/06/2015 0941   VLDL 55* 04/06/2015 0941   LDLCALC 46 04/06/2015 0941    RADIOLOGY: No results found.   ASSESSMENT AND PLAN: Timothy. Johnanthony Wilden is a 64 year old African-American male who has a cardiac risk factor profile notable for a  long-standing history of hypertension and diabetes mellitus, a history of hyperlipidemia, and remote tobacco use of 40 years.  He also has had issues with peripheral neuropathy.  Presently, his blood pressure today is controlled on his low-dose ACE inhibitor with Vasotec 2.5 mg daily.  He is diabetic on metformin and insulin.  He has been taking gabapentin for his peripheral neuropathy.   When I initially saw him, he was experiencing some mild shortness of breath with activity.  I reviewed his echo Doppler study as well as his nuclear perfusion study with him in detail. He developed asymptomatic ST segment changes on his ECG, without evidence for ischemia on nuclear imaging. 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 I am recommending further titration of his Vasotec to 5 mg daily and am  also adding Toprol-XL 25 mg daily for beta blocker treatment. He feels improved with the addition of the vascepa to his simvastatin in light  of his significant hypertriglyceridemia.  I discussed the importance to have significantly improved diabetic control. We discussed some mild weight loss since he is mildly obese with a body mass index of 31.5. Follow-up laboratory is recommended in several months and I will see him in the office for follow-up evaluation.   Time spent: 25 minutes  Troy Sine, MD, [facility] 06/03/2015 10:44 AM

## 2015-06-05 ENCOUNTER — Ambulatory Visit (INDEPENDENT_AMBULATORY_CARE_PROVIDER_SITE_OTHER): Payer: Self-pay | Admitting: Family Medicine

## 2015-06-05 ENCOUNTER — Encounter: Payer: Self-pay | Admitting: Pharmacist

## 2015-06-05 VITALS — BP 138/78 | Ht 71.0 in | Wt 226.8 lb

## 2015-06-05 DIAGNOSIS — E1165 Type 2 diabetes mellitus with hyperglycemia: Secondary | ICD-10-CM

## 2015-06-05 DIAGNOSIS — Z794 Long term (current) use of insulin: Secondary | ICD-10-CM

## 2015-06-05 NOTE — Progress Notes (Signed)
Subjective:  Patient presents today for 3 month diabetes follow-up as part of the employer-sponsored Link to Wellness program.  Current diabetes regimen includes Lantus 80 units SQ once daily, metformin 1000 mg BID, Januvia 100 mg daily.  Patient also continues on daily ASA, ACE Inhibitor and statin.    At last visit patient was undergoing multiple cardiac testing with Dr. Claiborne Billings. He previously was having chest pain and shortness of breath. Those have both resolved since seeing the cardiologist. New medications were started- metoprolol ER 25 mg daily (patient will pick up today) and enalapril was increased to 5 mg daily.   Last visit with Dr. Delfina Redwood was last month. Most recent was 12.1%.  Lantus was increased at that visit to 80 units SQ daily. Patient reports that he has a visit pending to his office in November.   Assessment:  Diabetes: Most recent A1C was 12.1  % which is  exceeding goal of less than 7%. Weight is increased from last visit with me.    CBG Review: Patient is checking CBG 4-5 times a week, usually before dinner. He has struggled with checking in the morning in the past.   He denies hypoglycemia.   High/Low:225/76  Overall I think CBGs have improved since his A1C of 12 in August. He has made some important changes to his diet in particular and I am hopeful that his A1C will be closer to goal at his next visit with Dr. Delfina Redwood. I have recommended to Dr. Delfina Redwood that patient start a short acting insulin but provider has been hesitant to do so.     Lifestyle improvements:  Physical Activity-  He states that he is walking some. Last week he walked twice for at least 30 minutes. He is also using the stationary bike a handful of times in the last three weeks.   Nutrition-  He has made many improvements to his diet. He has cut back on night snacking, especially with chips. He has cut out eating ice cream and rice pudding. He has replaced this with 1 or 2 sugar free [employer]ach[employer]  candies. He is also eating sugar free jello.   He is still eating cheese- shredded cheese in salad. He has cut back on this quite a bit from last visit.     Follow up with me in 6 weeks.     Plan/Goals for Next Visit:  1. Continue to use the sugar free candies as a way to satisfy your craving for something sweet while avoiding other concentrated sweets like ice cream, rice pudding, cookies, chocolate.  2. Increase walking to four days a week for at least 30 minutes. Try to walk in the morning before you go to work. If you can't walk, try riding your bike.  3. Keep your appointment with Dr. Delfina Redwood.     Next appointment to see me is: Monday December 19th at [place] [facility]. Donneta Romberg, PharmD, BCPS, CDE Norm Parcel to [place] Coordinator [phone]

## 2015-06-19 NOTE — Progress Notes (Signed)
I have reviewed this pharmacist's note and agree  

## 2015-07-17 ENCOUNTER — Ambulatory Visit: Payer: Self-pay | Admitting: Pharmacist

## 2015-08-07 ENCOUNTER — Encounter: Payer: Self-pay | Admitting: Pharmacist

## 2015-08-07 ENCOUNTER — Ambulatory Visit (INDEPENDENT_AMBULATORY_CARE_PROVIDER_SITE_OTHER): Payer: Self-pay | Admitting: Family Medicine

## 2015-08-07 VITALS — BP 148/78 | Wt 230.4 lb

## 2015-08-07 DIAGNOSIS — E1165 Type 2 diabetes mellitus with hyperglycemia: Secondary | ICD-10-CM

## 2015-08-07 DIAGNOSIS — Z794 Long term (current) use of insulin: Secondary | ICD-10-CM

## 2015-08-07 NOTE — Progress Notes (Signed)
Subjective:  Patient presents today for 6 week diabetes follow-up as part of the employer-sponsored Link to Wellness program.  Current diabetes regimen includes metformin 1000 mg BID, Januvia 100 mg daily and Lantus 80-units SQ once daily. Patient also continues on daily ASA, ACE Inhibitor and statin.  Most recent MD follow-up was this week with Dr. Delfina Redwood. Patient states he had an A1C checked but he does not know the result. Patient has a pending appt for April 2016.  No med changes or major health changes at this time.   Patient states he has a lot going on his life at the moment. 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   Assessment:  Diabetes: Most recent A1C was 12.1  % which is exceeding goal of less than 7%. I have requested most recent A1C from Dr. Lina Sar office and will update this when I hear back. Based on CBGs on meter I expect that his A1C will be lower than 12%, but likely still above goal.   I refilled medications for patient today. He states that he takes medication on a regular basis but he was late (sometimes 2 months past due) on several medications including metformin and Vascepa. Refilled medications for patient today.   Weight is increased from last visit with me, 4 pounds in the last 2 months and 15 pounds in the last 4 months. Patient thinks weight is up because he is eating at night and then going straight to bed.    CBG Review: Patient brings his meter to the appointment. He is checking several times a week and usually in the afternoon or evening. Usually it is before dinner.   Denies hypoglycemia. Meter shows a few readings less than 80- patient states he does not have symptoms at this level.   Overall CBGs have improved since his last visit with me. This is likely because patient is skipping lunch. When he checks in the afternoon, CBGs are lower.   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   He states that occasionally he is walking- maybe 3 times in the last month. He wants to start walking more often.   Nutrition-  He reports that sometimes when he is working he doesn't eat. He often skips lunch. Lately he has been eating a lot of pears.   One of his goals last time was to change to the sugar free candy. He states that this has been going well for him. He has been avoiding ice cream, cookies, etc. He does admit to eating some sweet potato pie.   B- oatmeal with a little bit of butter and splenda, sometimes cinnamon; some egg whites, sausage and bacon.  L- usually skips. When he feels like his sugar is getting low he will eat crackers or a sandwich.  D- Baked meats and food; mostly chicken or [place]. He has been avoiding fried foods. Also eating salads with chicken and crackers on the side.    Follow up with me in 6 weeks.     I will fax to Dr. Lina Sar office to get A1C results.   Plan/Goals for Next Visit:  1. Don't skip meals. Even if you aren't hungry for a full lunch, you need to eat something with carbohydrates in  it so your blood sugar doesn't drop.  2. Start walking inside either at the mall or [employer]. 3 days a week for 30 minutes.      Next appointment to see me is: Monday February 6th at 9 AM.    T[place]ett [place]. Donneta Romberg, PharmD, BCPS, CDE Norm Parcel to [place] Coordinator [phone]

## 2015-08-18 NOTE — Progress Notes (Signed)
I have reviewed this pharmacist's note and agree  

## 2015-09-04 ENCOUNTER — Other Ambulatory Visit: Payer: Self-pay | Admitting: Internal Medicine

## 2015-09-04 DIAGNOSIS — R1011 Right upper quadrant pain: Secondary | ICD-10-CM | POA: Diagnosis not present

## 2015-09-04 DIAGNOSIS — R0789 Other chest pain: Secondary | ICD-10-CM | POA: Diagnosis not present

## 2015-09-04 DIAGNOSIS — R100 Acute abdomen: Secondary | ICD-10-CM

## 2015-09-04 MED FILL — traMADol HCL 50 MG TABS: 50 | 15 days supply | Qty: 30 | Fill #0

## 2015-09-08 ENCOUNTER — Other Ambulatory Visit: Payer: Self-pay | Admitting: Internal Medicine

## 2015-09-08 ENCOUNTER — Ambulatory Visit
Admission: RE | Admit: 2015-09-08 | Discharge: 2015-09-08 | Disposition: A | Discharge status: Home / Self Care | Payer: 59 | Source: Ambulatory Visit | Attending: Internal Medicine | Admitting: Internal Medicine

## 2015-09-08 DIAGNOSIS — R1011 Right upper quadrant pain: Secondary | ICD-10-CM

## 2015-09-08 DIAGNOSIS — K8020 Calculus of gallbladder without cholecystitis without obstruction: Secondary | ICD-10-CM | POA: Diagnosis not present

## 2015-09-11 ENCOUNTER — Ambulatory Visit (INDEPENDENT_AMBULATORY_CARE_PROVIDER_SITE_OTHER): Payer: 59 | Admitting: Cardiovascular Disease

## 2015-09-11 ENCOUNTER — Encounter: Payer: Self-pay | Admitting: Cardiovascular Disease

## 2015-09-11 VITALS — BP 134/80 | HR 64 | Ht 72.0 in | Wt 228.0 lb

## 2015-09-11 DIAGNOSIS — E0843 Diabetes mellitus due to underlying condition with diabetic autonomic (poly)neuropathy: Secondary | ICD-10-CM

## 2015-09-11 DIAGNOSIS — E785 Hyperlipidemia, unspecified: Secondary | ICD-10-CM

## 2015-09-11 DIAGNOSIS — I10 Essential (primary) hypertension: Secondary | ICD-10-CM

## 2015-09-11 DIAGNOSIS — R0602 Shortness of breath: Secondary | ICD-10-CM

## 2015-09-11 NOTE — Patient Instructions (Signed)
Your physician wants you to follow-up in: 1 year or sooner if needed. You will receive a reminder letter in the mail two months in advance. If you don't receive a letter, please call our office to schedule the follow-up appointment.   If you need a refill on your cardiac medications before your next appointment, please call your pharmacy.   

## 2015-09-12 ENCOUNTER — Encounter: Payer: Self-pay | Admitting: Cardiovascular Disease

## 2015-09-12 NOTE — Progress Notes (Signed)
Patient ID: Timothy Peters, male   DOB: [birth year], 65 y.o.   MRN: [id number]     Primary MD: Dr. Delfina Peters  HPI: [name] is a 65 y.o. male presents for four-month follow-up cardiology evaluation.  Mr. Timothy Peters has a long-standing history of type 2 diabetes mellitus and has been on treatment since 1999.  He has a history for hypertension, hyperlipidemia as well as peripheral neuropathy. In 2016, he began to notice some chest discomfort in the rib cage region.  He also has noticed several episodes of dizziness with heat.  He has begun to notice a definite change in his development of shortness of breath with activity.  He was referred by Dr. Delfina Peters for Cardiologic evaluation and I initially saw him 6 months ago.  He has undergone an echo Doppler study on 04/13/2015 which revealed normal systolic function with mild focal basal hypertrophy of the septum.  Ejection fraction was 55-60%.  There was grade 2 diastolic dysfunction.  There was mild dilatation of his aortic root,right ventricle, and right atrium.  A nuclear perfusion study was low risk.  He developed asymptomatic ST depression laterally.  There was mild inferior thinning without ischemia.  Ejection fraction was 46% post stress.  Laboratory in August 2016 revealed significant triglyceride elevation at 277, and VLDL at 55.  Total cholesterol was 137 and LDL was 46. I added for Vascepa 2 capsules twice a day to his current dose of simvastatin 20 mg.  He has felt improved with this and seems to admit to having more energy and being less sluggish. He has been taking enalapril 2.5 mg daily in addition to his diabetic medications including Januvia, metformin and insulin. He is on Neurontin for peripheral neuropathy.  Laboratory in August revealed poorly controlled diabetes with a hemoglobin A1c of 12.1.  Dr. Delfina Peters is following his diabetes mellitus for medication changes.  When I last saw him 4 months ago, I further titrated his Vasotec to 5  mg and added Toprol-XL 25 mg.  Over the past 6 months.  He believes his shortness of breath is better.  He tells me last week he had an ultrasound of his abdomen.  He is scheduled to have follow-up laboratory with Dr. Librarian, Peters.  He feels well.  He denies palpitations.  He denies PND, orthopnea.  He denies syncope.  He presents for reevaluation.  Past Medical History  Diagnosis Date  . Diabetes mellitus without complication ([facility])   . Hypertension   . Hyperlipidemia     Past Surgical History  Procedure Laterality Date  . Nerve damage left arm  1990    No Known Allergies  Current Outpatient Prescriptions  Medication Sig Dispense Refill  . aspirin 81 MG tablet Take 81 mg by mouth daily.    . enalapril (VASOTEC) 5 MG tablet Take 1 tablet (5 mg total) by mouth daily. 30 tablet 6  . gabapentin (NEURONTIN) 300 MG capsule Take 300 mg by mouth 2 (two) times daily.    Marland Kitchen glipiZIDE (GLUCOTROL) 5 MG tablet Take 1 tablet by mouth daily. Take 1 tab daily  3  . glucose blood test strip Use as instructed 100 each 12  . Icosapent Ethyl (VASCEPA) 1 G CAPS Take 2 capsules by mouth 2 (two) times daily. 120 capsule 11  . insulin glargine (LANTUS) 100 UNIT/ML injection Inject 80 Units into the skin at bedtime.     . Insulin Pen Needle 31G X 8 MM MISC Use as directed for daily insulin  100 each 3  . metFORMIN (GLUCOPHAGE) 1000 MG tablet Take 1 tablet by mouth daily. Take 1 tab daily  4  . metoprolol succinate (TOPROL XL) 25 MG 24 hr tablet Take 1 tablet (25 mg total) by mouth daily. 30 tablet 6  . Multiple Vitamins-Minerals (MULTIVITAMIN WITH MINERALS) tablet Take 1 tablet by mouth daily.    . sildenafil (VIAGRA) 100 MG tablet Take 100 mg by mouth daily as needed for erectile dysfunction.    . simvastatin (ZOCOR) 20 MG tablet Take 20 mg by mouth daily.    . sitaGLIPtin (JANUVIA) 100 MG tablet Take 100 mg by mouth daily.    . traMADol (ULTRAM) 50 MG tablet Use as needed for pain  0   No current  facility-administered medications for this visit.    Social History   Social History  . Marital Status: Married    Spouse Name: N/A  . Number of Children: N/A  . Years of Education: N/A   Occupational History  . Not on file.   Social History Main Topics  . Smoking status: Former Smoker    Quit date: 11/30/2007  . Smokeless tobacco: Not on file  . Alcohol Use: No  . Drug Use: No  . Sexual Activity: Not on file   Other Topics Concern  . Not on file   Social History Narrative   Additional social history is notable that he was born in [place].  He currently is in his second marriage for 15 years.  He has 3 children, 3 grandchildren, and one great-grandchild.  He previously had worked [employer].  He has a high school graduation via a GED program.  There is a remote 40 year history of tobacco use but he quit in 2004.  He does not drink alcohol.  He does not routinely exercise.  Family History  Problem Relation Age of Onset  . Hypertension Mother   . Stroke Mother   . Heart attack Father   . Cancer Sister   . Cancer Brother   . Diabetes Other   . Hypertension Other   . Hyperlipidemia Other   . Heart attack Maternal Grandmother   . Hypertension Maternal Grandfather   . Stroke Paternal Grandmother   . Stroke Paternal Grandfather    Additional family history is notable that his mother is still living at age 55 and has a history of hypertension.  Father died at age 42 and had suffered a myocardial infarction.  One of his 4 brothers is deceased secondary to brain cancer.  One of his 3 sisters is deceased secondary to brain cancer.  ROS General: Negative; No fevers, chills, or night sweats HEENT: Negative; No changes in vision or hearing, sinus congestion, difficulty swallowing Pulmonary: Negative; No cough, wheezing, shortness of breath, hemoptysis Cardiovascular:  See HPI;  GI: Negative; No nausea, vomiting, diarrhea, or abdominal pain GU: Negative; No dysuria, hematuria, or  difficulty voiding Musculoskeletal: Negative; no myalgias, joint pain, or weakness Hematologic/Oncologic: Negative; no easy bruising, bleeding Endocrine:  Positive for diabetes mellitus Neuro: Negative; no changes in balance, headaches Skin: Negative; No rashes or skin lesions Psychiatric: Negative; No behavioral problems, depression Sleep: Negative; No daytime sleepiness, hypersomnolence, bruxism, restless legs, hypnogagnic hallucinations Other comprehensive 14 point system review is negative   Physical Exam BP 134/80 mmHg  Pulse 64  Ht 6' (1.829 m)  Wt 228 lb (103.42 kg)  BMI 30.92 kg/m2   Repeat blood pressure by me 122/70.  Wt Readings from Last 3 Encounters:  09/11/15 228 lb (103.42 kg)  08/07/15 230 lb 6.4 oz (104.509 kg)  06/05/15 226 lb 12.8 oz (102.876 kg)   General: Alert, oriented, no distress.  Skin: normal turgor, no rashes, warm and dry HEENT: Normocephalic, atraumatic. Pupils equal round and reactive to light; sclera anicteric; extraocular muscles intact; Fundi mild vessel tortuosity, no hemorrhages or exudates Nose without nasal septal hypertrophy Mouth/Parynx benign; Mallinpatti scale 3 Neck: No JVD, no carotid bruits; normal carotid upstroke Lungs: clear to ausculatation and percussion; no wheezing or rales Chest wall: without tenderness to palpitation Heart: PMI not displaced, RRR, s1 s2 normal, 1/6 systolic murmur, no diastolic murmur, no rubs, gallops, thrills, or heaves Abdomen: soft, nontender; no hepatosplenomehaly, BS+; abdominal aorta nontender and not dilated by palpation. Back: no CVA tenderness Pulses 2+; soft femoral bruits. Musculoskeletal: full range of motion, normal strength, no joint deformities Extremities: no clubbing cyanosis or edema, Homan's sign negative  Neurologic: grossly nonfocal; Cranial nerves grossly wnl Psychologic: Normal mood and affect  ECG (independently read by me): Normal sinus rhythm at 64 bpm.  Borderline LVH.  Normal  intervals.  October 2016 ECG (independently read by me):  Normal sinus rhythm at 69 bpm.  No ectopy.  Normal intervals.  August 2016 ECG (independently read by me): Normal sinus rhythm at 71 bpm.  Normal intervals.  No ST segment changes.  LABS:  BMP Latest Ref Rng 04/06/2015 10/30/2011  Glucose 65 - 99 mg/dL 233(H) 72  BUN 7 - 25 mg/dL 20 16  Creatinine 0.70 - 1.25 mg/dL 1.03 0.96  Sodium 135 - 146 mmol/L 137 141  Potassium 3.5 - 5.3 mmol/L 4.3 4.6  Chloride 98 - 110 mmol/L 102 103  CO2 20 - 31 mmol/L 26 25  Calcium 8.6 - 10.3 mg/dL 9.2 10.2    Hepatic Function Latest Ref Rng 04/06/2015 10/30/2011  Total Protein 6.1 - 8.1 g/dL 7.9 8.1  Albumin 3.6 - 5.1 g/dL 3.8 4.4  AST 10 - 35 U/L 24 28  ALT 9 - 46 U/L 25 19  Alk Phosphatase 40 - 115 U/L 64 80  Total Bilirubin 0.2 - 1.2 mg/dL 0.5 0.6    CBC Latest Ref Rng 04/06/2015 10/30/2011  WBC 4.0 - 10.5 K/uL 7.1 10.2  Hemoglobin 13.0 - 17.0 g/dL 13.6 14.8  Hematocrit 39.0 - 52.0 % 41.7 45.2  Platelets 150 - 400 K/uL 230 311   Lab Results  Component Value Date   MCV 88.5 04/06/2015   MCV 89.9 10/30/2011   Lab Results  Component Value Date   TSH 1.288 04/06/2015   Lab Results  Component Value Date   HGBA1C 12.1* 04/06/2015     BNP No results found for: BNP  ProBNP No results found for: PROBNP   Lipid Panel     Component Value Date/Time   CHOL 137 04/06/2015 0941   TRIG 277* 04/06/2015 0941   HDL 36* 04/06/2015 0941   CHOLHDL 3.8 04/06/2015 0941   VLDL 55* 04/06/2015 0941   LDLCALC 46 04/06/2015 0941    RADIOLOGY: Dg Ribs Unilateral W/chest Right  09/08/2015  CLINICAL DATA:  RIGHT upper quadrant pain for 3 weeks.  No injury. EXAM: RIGHT RIBS AND CHEST - 3+ VIEW COMPARISON:  None. FINDINGS: No fracture or other bone lesions are seen involving the ribs. There is no evidence of pneumothorax or pleural effusion. Both lungs are clear. Heart size and mediastinal contours are within normal limits. IMPRESSION: Negative.  Electronically Signed   By: Staci Righter M.D.   On:  09/08/2015 13:12   US Abdomen Limited Ruq  09/08/2015  CLINICAL DATA:  Right upper quadrant pain. EXAM: US ABDOMEN LIMITED - RIGHT UPPER QUADRANT COMPARISON:  CT 02/10/2015 FINDINGS: Gallbladder: 1.5 cm gallstone layers dependently within the gallbladder. This does not definitively move with decubitus positioning. However, this is not obstructing. No wall thickening. Negative sonographic Murphy's. Common bile duct: Diameter: Normal caliber, 5 mm Liver: No focal lesion identified. Within normal limits in parenchymal echogenicity. IMPRESSION: Cholelithiasis.  No sonographic evidence of acute cholecystitis. Electronically Signed   By: Rolm Baptise M.D.   On: 09/08/2015 13:20     ASSESSMENT AND PLAN: Mr. Timothy Peters is a 65 year old African-American male who has a cardiac risk factor profile notable for a  long-standing history of hypertension, diabetes mellitus, hyperlipidemia, and remote tobacco use of 40 years.  He also has had issues with peripheral neuropathy.   He is diabetic on metformin and insulin.  He has been taking gabapentin for his peripheral neuropathy.   When I initially saw him, he was experiencing mild shortness of breath with activity. He developed asymptomatic ST segment changes on his ECG, without evidence for ischemia on nuclear imaging. 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  When I last saw him I further titrated his Vasotec to 5 mg.  He has felt improved on this therapy and presently is been without shortness of breath.  He is on simvastatin 20 mg for hyperlipidemia and target LDL should be less than 70 in this diabetic male.  He also has improved with the addition of a Vascepa for his significant triglyceride elevation.  He will be having follow-up blood work with Dr. Delfina Peters.  His ventricular rate is controlled and he is tolerating Toprol-XL 25 mg.  We again discussed the importance of  weight loss.  His BMI is consistent with mild obesity at 30.9.  We discussed increased exercise.  As long as he continues to see Dr. Delfina Peters at 3 month intervals, I will see him in one year for cardiology evaluation.  Time spent: 25 minutes  Troy Sine, MD, [facility] 09/12/2015 5:58 PM

## 2015-09-21 ENCOUNTER — Ambulatory Visit (INDEPENDENT_AMBULATORY_CARE_PROVIDER_SITE_OTHER): Payer: Self-pay | Admitting: Internal Medicine

## 2015-09-21 ENCOUNTER — Encounter: Payer: Self-pay | Admitting: Internal Medicine

## 2015-09-21 VITALS — BP 158/75 | HR 74 | Temp 97.5°F | Ht 72.0 in | Wt 229.5 lb

## 2015-09-21 DIAGNOSIS — E1165 Type 2 diabetes mellitus with hyperglycemia: Secondary | ICD-10-CM

## 2015-09-21 DIAGNOSIS — G8929 Other chronic pain: Secondary | ICD-10-CM

## 2015-09-21 DIAGNOSIS — K8020 Calculus of gallbladder without cholecystitis without obstruction: Secondary | ICD-10-CM | POA: Insufficient documentation

## 2015-09-21 DIAGNOSIS — Z794 Long term (current) use of insulin: Secondary | ICD-10-CM

## 2015-09-21 DIAGNOSIS — E1140 Type 2 diabetes mellitus with diabetic neuropathy, unspecified: Secondary | ICD-10-CM

## 2015-09-21 DIAGNOSIS — Z7984 Long term (current) use of oral hypoglycemic drugs: Secondary | ICD-10-CM

## 2015-09-21 DIAGNOSIS — E785 Hyperlipidemia, unspecified: Secondary | ICD-10-CM

## 2015-09-21 DIAGNOSIS — R1011 Right upper quadrant pain: Secondary | ICD-10-CM

## 2015-09-21 DIAGNOSIS — E119 Type 2 diabetes mellitus without complications: Secondary | ICD-10-CM

## 2015-09-21 DIAGNOSIS — Z79899 Other long term (current) drug therapy: Secondary | ICD-10-CM

## 2015-09-21 DIAGNOSIS — I10 Essential (primary) hypertension: Secondary | ICD-10-CM

## 2015-09-21 DIAGNOSIS — E1142 Type 2 diabetes mellitus with diabetic polyneuropathy: Secondary | ICD-10-CM

## 2015-09-21 LAB — POCT GLYCOSYLATED HEMOGLOBIN (HGB A1C): Hemoglobin A1C: 8.3

## 2015-09-21 LAB — GLUCOSE, CAPILLARY: Glucose-Capillary: 198 mg/dL — ABNORMAL HIGH (ref 65–99)

## 2015-09-21 MED ORDER — INSULIN PEN NEEDLE 31G X 5 MM MISC: 1.0000 [IU] | Status: DC

## 2015-09-21 MED ORDER — SIMVASTATIN 20 MG PO TABS: 20.0000 mg | ORAL_TABLET | Freq: Every day | ORAL | Status: DC

## 2015-09-21 MED ORDER — GLIPIZIDE 5 MG PO TABS: 5.0000 mg | ORAL_TABLET | Freq: Two times a day (BID) | ORAL | Status: DC

## 2015-09-21 MED ORDER — METFORMIN HCL 1000 MG PO TABS: 1000.0000 mg | ORAL_TABLET | Freq: Every day | ORAL | Status: DC

## 2015-09-21 MED ORDER — INSULIN GLARGINE 300 UNIT/ML ~~LOC~~ SOPN: 80.0000 [IU] | PEN_INJECTOR | Freq: Every day | SUBCUTANEOUS | Status: DC

## 2015-09-21 MED ORDER — METOPROLOL TARTRATE 25 MG PO TABS: 12.5000 mg | ORAL_TABLET | Freq: Two times a day (BID) | ORAL | Status: DC

## 2015-09-21 NOTE — Progress Notes (Signed)
[facility] Subjective:   Patient ID: Timothy Peters male   DOB: July 27, 1951 65 y.o.   MRN: [id number]  HPI: [name] is a 65 y.o. male with a PMH detailed below who presents for to re-establish care.  Please see problem based charting below for the status of his chronic medical problems.  Past Medical History  Diagnosis Date  . Diabetes mellitus without complication ([place])   . Hypertension   . Hyperlipidemia    Current Outpatient Prescriptions  Medication Sig Dispense Refill  . aspirin 81 MG tablet Take 81 mg by mouth daily.    . enalapril (VASOTEC) 5 MG tablet Take 1 tablet (5 mg total) by mouth daily. 30 tablet 6  . gabapentin (NEURONTIN) 300 MG capsule Take 300 mg by mouth 2 (two) times daily.    Marland Kitchen glipiZIDE (GLUCOTROL) 5 MG tablet Take 1 tablet (5 mg total) by mouth 2 (two) times daily before a meal. Take 1 tab daily 180 tablet 3  . glucose blood test strip Use as instructed 100 each 12  . Icosapent Ethyl (VASCEPA) 1 G CAPS Take 2 capsules by mouth 2 (two) times daily. 120 capsule 11  . Insulin Glargine (TOUJEO SOLOSTAR) 300 UNIT/ML SOPN Inject 80 Units into the skin daily. 5 pen 11  . Insulin Pen Needle 31G X 5 MM MISC 1 Units by Does not apply route as directed. 100 each 11  . metFORMIN (GLUCOPHAGE) 1000 MG tablet Take 1 tablet (1,000 mg total) by mouth daily. Take 1 tab daily 180 tablet 3  . metoprolol tartrate (LOPRESSOR) 25 MG tablet Take 0.5 tablets (12.5 mg total) by mouth 2 (two) times daily. 90 tablet 3  . Multiple Vitamins-Minerals (MULTIVITAMIN WITH MINERALS) tablet Take 1 tablet by mouth daily.    . sildenafil (VIAGRA) 100 MG tablet Take 100 mg by mouth daily as needed for erectile dysfunction.    . simvastatin (ZOCOR) 20 MG tablet Take 1 tablet (20 mg total) by mouth daily. 90 tablet 3  . traMADol (ULTRAM) 50 MG tablet Use as needed for pain  0   No current facility-administered medications for this visit.   Family History   Problem Relation Age of Onset  . Hypertension Mother   . Stroke Mother   . Heart attack Father   . Cancer Sister   . Cancer Brother   . Diabetes Other   . Hypertension Other   . Hyperlipidemia Other   . Heart attack Maternal Grandmother   . Hypertension Maternal Grandfather   . Stroke Paternal Grandmother   . Stroke Paternal Grandfather    Social History   Social History  . Marital Status: Married    Spouse Name: N/A  . Number of Children: N/A  . Years of Education: N/A   Social History Main Topics  . Smoking status: Former Smoker    Quit date: 11/30/2007  . Smokeless tobacco: None  . Alcohol Use: No     Comment: Quit x 13 yrs.  . Drug Use: No  . Sexual Activity: Not Asked   Other Topics Concern  . None   Social History Narrative   Review of Systems: Review of Systems  Constitutional: Negative for fever and chills.  Eyes: Negative for blurred vision.  Respiratory: Negative for cough and shortness of breath.   Cardiovascular: Positive for chest pain (right chest wall).  Gastrointestinal: Positive for abdominal pain.  Genitourinary: Negative for dysuria and hematuria.  Musculoskeletal: Negative for falls.  Neurological: Negative  for headaches.  Endo/Heme/Allergies: Negative for polydipsia.  All other systems reviewed and are negative.    Objective:  Physical Exam: Filed Vitals:   09/21/15 1539  BP: 158/75  Pulse: 74  Temp: 97.5 F (36.4 C)  TempSrc: Oral  Height: 6' (1.829 m)  Weight: 229 lb 8 oz (104.101 kg)  SpO2: 99%  Physical Exam  Constitutional: He is oriented to person, place, and time and well-developed, well-nourished, and in no distress.  Cardiovascular: Normal rate and regular rhythm.   Pulmonary/Chest: Effort normal and breath sounds normal.  Abdominal: Soft. Bowel sounds are normal.  Musculoskeletal: He exhibits tenderness (intercostial muscles of right flank). He exhibits no edema.  Neurological: He is alert and oriented to person,  place, and time.  Nursing note and vitals reviewed.   Assessment & Plan:  Case discussed with Dr. Beryle Beams  HTN (hypertension) HPI: He has been inconsistent with taking his medications lately, trying to "spread them out" due to cost concerns after losing insurance.  A: Essential HTN, not at goal   P: -He was previously well controlled in the EMR on his current medications but will make adjustments for affordability - Change metoprolol succinate to tartrate 12.5mg  BID. -Continue enalapril 5mg  daily  Type 2 diabetes, uncontrolled, with neuropathy ([facility]) HPI:  He has been taking Metformin 1g BID, Januvia 100mg  daily, glipizide 5mg  daily and Lantus 80 units QHS.  He has had no low blood sugars and feels his sugars have been reasonably controlled recently.  HE is very concerned about the cost of his medications and how he will be able to afford them.  He has had an eye exam within the last year and reports that he had no DR.  He has a history of diabetic neuropathy for which he takes gabapentin  A: Type 2 DM uncontrolled with diabetic neuropathy  P: - Continue Metformin 1g BID - Change Lantus to Toujeo, continue at 80 units QHS (I printed a savings card for toujeo which hopefully will provide him with affordable insulin for 1 year) - D/C Januvia (costly and low efficacy) - Increase Glipizde to 5mg  BID. - Will obtain last eye exam records.  Hyperlipidemia HPI: He has been taking simvastatin 20mg  daily for HLD.  He has no issues with the medication.  A: HLD  P:  -Continue simvastatin 20mg  daily  Cholelithiasis HPI: With his previous PCP- Dr Delfina Redwood he reports right upper abdominal pain/flank pain, workup available at this time to me in the EHR shows rib x-rays without an acute fracture, and a RUQ ultrasound which shows cholelithiasis without evidence of cholecystitis.  A: Cholelithiasis  P: -I am not clear from his report if his RUQ pain is due to symptomatic cholelithiasis or  due to MSK complaints. - Will obtain records from Dr Polite's office - If due to cholelithiasis will need surgical referral which I have explained to him however he is concerned about the cost so will try get all of his records before referral.  Abdominal pain, chronic, right upper quadrant HPI: Patient reports right side upper abdominal pain/flank pain.  He feels the pain is located in/under his rib cage. The pain is dull and constant, it never goes away completely.  It is not affected by eating.  It started about 1 month ago. He has taking OTC ibuprofen for the pain and seems to help.  He has not had any trauma to the area.  A: RUQ chronic abdominal pain  P:  Review of imaging shows negative  rib films and U/S with gallstones. -Will obtain records, my initial impression is that this pain may be more MSK in nature than due to symptomatic chololithiasis. -Continue OTC nsaids.    Medications Ordered Meds ordered this encounter  Medications  . metFORMIN (GLUCOPHAGE) 1000 MG tablet    Sig: Take 1 tablet (1,000 mg total) by mouth daily. Take 1 tab daily    Dispense:  180 tablet    Refill:  3  . Insulin Glargine (TOUJEO SOLOSTAR) 300 UNIT/ML SOPN    Sig: Inject 80 Units into the skin daily.    Dispense:  5 pen    Refill:  11  . Insulin Pen Needle 31G X 5 MM MISC    Sig: 1 Units by Does not apply route as directed.    Dispense:  100 each    Refill:  11  . glipiZIDE (GLUCOTROL) 5 MG tablet    Sig: Take 1 tablet (5 mg total) by mouth 2 (two) times daily before a meal. Take 1 tab daily    Dispense:  180 tablet    Refill:  3  . metoprolol tartrate (LOPRESSOR) 25 MG tablet    Sig: Take 0.5 tablets (12.5 mg total) by mouth 2 (two) times daily.    Dispense:  90 tablet    Refill:  3  . simvastatin (ZOCOR) 20 MG tablet    Sig: Take 1 tablet (20 mg total) by mouth daily.    Dispense:  90 tablet    Refill:  3   Other Orders Orders Placed This Encounter  Procedures  . Glucose, capillary   . POCT HgB A1C (CPT [phone])   Follow Up: Return in about 3 months (around 12/19/2015), or if symptoms worsen or fail to improve.

## 2015-09-21 NOTE — Patient Instructions (Signed)
General Instructions:  I have changed your Metoprolol succinate to metoprolol tartrate, this one you will take one half a pill twice a day.  I am switching your insulin to Toujeo I want you to continue 80units a day.  Please bring your medicines with you each time you come to clinic.  Medicines may include prescription medications, over-the-counter medications, herbal remedies, eye drops, vitamins, or other pills.   Progress Toward Treatment Goals:  Treatment Goal 10/06/2012  Hemoglobin A1C improved  Blood pressure at goal    Self Care Goals & Plans:  Self Care Goal 09/21/2015  Manage my medications take my medicines as prescribed; bring my medications to every visit; refill my medications on time  Monitor my health bring my glucose meter and log to each visit; keep track of my blood glucose; keep track of my blood pressure; check my feet daily  Eat healthy foods eat more vegetables; eat foods that are low in salt; eat baked foods instead of fried foods  Be physically active find an activity I enjoy  Prevent falls have my vision checked; wear appropriate shoes    Home Blood Glucose Monitoring 07/27/2012  Check my blood sugar 4 times a day  When to check my blood sugar before meals; at bedtime     Care Management & Community Referrals:  No flowsheet data found.

## 2015-09-23 DIAGNOSIS — R1012 Left upper quadrant pain: Secondary | ICD-10-CM

## 2015-09-23 DIAGNOSIS — R1011 Right upper quadrant pain: Secondary | ICD-10-CM | POA: Insufficient documentation

## 2015-09-23 NOTE — Assessment & Plan Note (Signed)
HPI: He has been inconsistent with taking his medications lately, trying to "spread them out" due to cost concerns after losing insurance.  A: Essential HTN, not at goal   P: -He was previously well controlled in the EMR on his current medications but will make adjustments for affordability - Change metoprolol succinate to tartrate 12.5mg  BID. -Continue enalapril 5mg  daily

## 2015-09-23 NOTE — Assessment & Plan Note (Signed)
HPI: He has been taking simvastatin 20mg  daily for HLD.  He has no issues with the medication.  A: HLD  P:  -Continue simvastatin 20mg  daily

## 2015-09-23 NOTE — Assessment & Plan Note (Signed)
HPI:  He has been taking Metformin 1g BID, Januvia 100mg  daily, glipizide 5mg  daily and Lantus 80 units QHS.  He has had no low blood sugars and feels his sugars have been reasonably controlled recently.  HE is very concerned about the cost of his medications and how he will be able to afford them.  He has had an eye exam within the last year and reports that he had no DR.  He has a history of diabetic neuropathy for which he takes gabapentin  A: Type 2 DM uncontrolled with diabetic neuropathy  P: - Continue Metformin 1g BID - Change Lantus to Toujeo, continue at 80 units QHS (I printed a savings card for toujeo which hopefully will provide him with affordable insulin for 1 year) - D/C Januvia (costly and low efficacy) - Increase Glipizde to 5mg  BID. - Will obtain last eye exam records.

## 2015-09-24 NOTE — Assessment & Plan Note (Signed)
HPI: With his previous PCP- Dr Delfina Redwood he reports right upper abdominal pain/flank pain, workup available at this time to me in the EHR shows rib x-rays without an acute fracture, and a RUQ ultrasound which shows cholelithiasis without evidence of cholecystitis.  A: Cholelithiasis  P: -I am not clear from his report if his RUQ pain is due to symptomatic cholelithiasis or due to MSK complaints. - Will obtain records from Dr Polite's office - If due to cholelithiasis will need surgical referral which I have explained to him however he is concerned about the cost so will try get all of his records before referral.

## 2015-09-24 NOTE — Assessment & Plan Note (Addendum)
HPI: Patient reports right side upper abdominal pain/flank pain.  He feels the pain is located in/under his rib cage. The pain is dull and constant, it never goes away completely.  It is not affected by eating.  It started about 1 month ago. He has taking OTC ibuprofen for the pain and seems to help.  He has not had any trauma to the area.  A: RUQ chronic abdominal pain  P:  Review of imaging shows negative rib films and U/S with gallstones. -Will obtain records, my initial impression is that this pain may be more MSK in nature than due to symptomatic chololithiasis. -Continue OTC nsaids.

## 2015-09-25 ENCOUNTER — Ambulatory Visit: Payer: Self-pay | Admitting: Pharmacist

## 2015-09-25 NOTE — Progress Notes (Signed)
Medicine attending: Medical history, presenting problems, physical findings, and medications, reviewed with resident physician Dr Erik Hoffman on the day of the patient visit and I concur with his evaluation and management plan. 

## 2015-09-28 ENCOUNTER — Ambulatory Visit: Payer: Self-pay

## 2015-10-05 ENCOUNTER — Ambulatory Visit: Payer: Self-pay

## 2015-10-11 ENCOUNTER — Encounter: Payer: Self-pay | Admitting: Internal Medicine

## 2015-10-12 ENCOUNTER — Other Ambulatory Visit: Payer: Self-pay | Admitting: Internal Medicine

## 2015-10-12 MED ORDER — LISINOPRIL 10 MG PO TABS: 10.0000 mg | ORAL_TABLET | Freq: Every day | ORAL | Status: DC

## 2015-10-12 NOTE — Telephone Encounter (Signed)
Spoke w/ pt and health dept Will need enalapril changed to lisinopril Will need to purchase viagra and tramadol and vascepa from other pharmacies as [facility] does not carry these

## 2015-10-12 NOTE — Telephone Encounter (Signed)
Pt requesting all Rx to be sent to Health department. Please call pt back.

## 2015-10-12 NOTE — Telephone Encounter (Signed)
I changed 5mg  of enalapirl to 10mg  of lisinopril [facility].  He should be able to transfer this other medications.

## 2015-10-13 MED ORDER — METFORMIN HCL 1000 MG PO TABS: 1000.0000 mg | ORAL_TABLET | Freq: Two times a day (BID) | ORAL | Status: DC

## 2015-10-13 NOTE — Telephone Encounter (Signed)
Metformin 1000 BID. Do I need to send in Rx?

## 2015-10-13 NOTE — Telephone Encounter (Signed)
[facility] trying to get this taken care of for patient before the weekend, don't think Dr. Heber  is here.  Can you help?

## 2015-10-13 NOTE — Telephone Encounter (Addendum)
[facility] calling to clarify if metformin 1000mg  is to be daily or BID.  180 tabs indicates to them may be BID dosing.  Looks like OV note reflects BID as well.   Please advise/update rx for [facility]

## 2015-10-13 NOTE — Telephone Encounter (Signed)
Yes please

## 2015-10-13 NOTE — Telephone Encounter (Signed)
Pls phone in metformin. Thanks

## 2015-10-13 NOTE — Telephone Encounter (Signed)
Phoned metformin 1000mg  BID with a meal to [facility]

## 2015-10-20 ENCOUNTER — Telehealth: Payer: Self-pay | Admitting: Dietician

## 2015-10-20 NOTE — Telephone Encounter (Signed)
Called patient as part of project trying to help patient's with a1C between 8-9% lower their blood sugars to target He says he Just got insulin and meter, his blood sugars are "a little elevated highest 180s,  Lowest 70s".  He was not able to get Toujeo ([facility] did not honor [employer]- the prescription was $300.00)  Got the orange card- went to health department- got lantus  Ordered but it will take 4-6 weeks to come in, they gave him 4 sample pens on Tuesday, had 2 pens taking 80- units/day, has enough until his appointment on monday Glipizide 5 mg twice a day, did not read directions so just started twice a day this past Tuesday  He admits Biggest problem is eating bread and exercise, suggested he try walks after meals and lite bread He is hoping to get to [place]- still working on getting there, maybe Monday,    He talked about  pain and burning every 30-45 seconds in his side for > 7 weeks that is concerning him.  He is hoping next a1C will be improved with current regimen. Told him that we may have lantus samples if needed until his comes in at the health department.

## 2015-10-23 ENCOUNTER — Ambulatory Visit (INDEPENDENT_AMBULATORY_CARE_PROVIDER_SITE_OTHER): Payer: Self-pay | Admitting: Internal Medicine

## 2015-10-23 ENCOUNTER — Encounter: Payer: Self-pay | Admitting: Internal Medicine

## 2015-10-23 VITALS — BP 148/75 | HR 74 | Temp 97.7°F | Ht 71.0 in | Wt 228.9 lb

## 2015-10-23 DIAGNOSIS — Z205 Contact with and (suspected) exposure to viral hepatitis: Secondary | ICD-10-CM

## 2015-10-23 DIAGNOSIS — R768 Other specified abnormal immunological findings in serum: Secondary | ICD-10-CM

## 2015-10-23 DIAGNOSIS — R21 Rash and other nonspecific skin eruption: Secondary | ICD-10-CM

## 2015-10-23 DIAGNOSIS — Z0000 Encounter for general adult medical examination without abnormal findings: Secondary | ICD-10-CM

## 2015-10-23 DIAGNOSIS — R208 Other disturbances of skin sensation: Secondary | ICD-10-CM

## 2015-10-23 DIAGNOSIS — B0229 Other postherpetic nervous system involvement: Secondary | ICD-10-CM

## 2015-10-23 LAB — GLUCOSE, CAPILLARY: Glucose-Capillary: 237 mg/dL — ABNORMAL HIGH (ref 65–99)

## 2015-10-23 NOTE — Assessment & Plan Note (Signed)
His wife was treated with Harvoni last year and he would like to be screened for hepatitis C and HIV which I've done today.

## 2015-10-23 NOTE — Patient Instructions (Addendum)
Mr. Waffle,  It was very nice meeting you today.  I think your rib pain is from "post-herpetic neuralgia," which is the Shingles virus that infects your nerves and causes pain.  We'll try a lidocaine patch; if you don't feel better in 3-4 weeks, give [place] a call and we will refer you to a pain specialist. You can also try capsaicin patches if these are more affordable.  Don't hesitate to call if something comes up; otherwise we'll see you in 3 months for a check-up.  Take care, Dr. Melburn Hake

## 2015-10-23 NOTE — Progress Notes (Signed)
Patient ID: Timothy Peters, male   DOB: April 12, 1951, 65 y.o.   MRN: [id number] [place] [facility] Subjective:   Patient ID: Timothy Peters male   DOB: 11-Jun-1951 65 y.o.   MRN: [id number]  HPI: Timothy Peters is a 65 y.o. male with cholelithiasis, insulin-dependent type 2 diabetes, hypertension, and erectile dysfunction presenting to clinic for follow-up of right costal pain.  Right costal pain: Back in January, he had an itchy generalized vesicular rash; one week later, he had severe burning right lower costal pain. He did not have a predominance of blisters over this area. His primary doctor checked a chest x-ray that did now show a rib fracture, and right upper quadrant ultrasound showed nonobstructing gallstones, without cholelithiasis. He tells me his pain is been worsening over the last 2 weeks, and continues to be burning and just under his skin. He denies any new skin lesions.  He is not smoking and I have reviewed his medications today.  Please see the assessment and plan for the status of the patient's chronic medical problems.  Review of Systems  Constitutional: Negative for fever, chills and malaise/fatigue.  Respiratory: Negative for cough, hemoptysis, shortness of breath and wheezing.   Cardiovascular: Negative for chest pain, palpitations, orthopnea and claudication.  Gastrointestinal: Negative for heartburn, nausea, abdominal pain, diarrhea, constipation, blood in stool and melena.  Musculoskeletal: Negative for myalgias and joint pain.  Skin: Positive for itching and rash.  Neurological: Negative for dizziness, tingling and loss of consciousness.   Objective:  Physical Exam: Filed Vitals:   10/23/15 1050  BP: 148/75  Pulse: 74  Temp: 97.7 F (36.5 C)  TempSrc: Oral  Height: 5\' 11"  (1.803 m)  Weight: 228 lb 14.4 oz (103.828 kg)  SpO2: 99%   General: resting in bed comfortably, appropriately conversational HEENT: no scleral icterus,  extra-ocular muscles intact, oropharynx without lesions Cardiac: regular rate and rhythm, no rubs, murmurs or gallops Pulm: breathing well, clear to auscultation bilaterally Abd: just over his right lower anterior ribs, he complains of burning worse with light touch; I don't see any healed vesicles or skin lesions over the area. Otherwise, bowel sounds normal, soft, nondistended, non-tender, no organomegaly Ext: warm and well perfused, without pedal edema Lymph: no cervical or supraclavicular lymphadenopathy Skin: no rash, hair, or nail changes Neuro: alert and oriented X3, cranial nerves II-XII grossly intact, moving all extremities well   Assessment & Plan:  Case discussed with Dr. Dareen Piano  Post herpetic neuralgia Assessment: His history of a generalized pruritic vesicular rash followed by superficial burning pain in the T11 dermatome sounds most suspicious for chickenpox followed by postherpetic neuralgia. Rib fracture was ruled out with a chest x-ray, and I seriously doubt this is symptomatic cholelithiasis given the history of tenderness to palpation. He also had a low risk stress test a few months ago.  Plan: I recommended he try topical capsaicin patches because these are more affordable than lidocaine patches. He already has gabapentin for his diabetic peripheral neuropathy; I advised him to take these at night as needed to help sleep. If he continues to have pain after 3 weeks, asked him to give [place] a call, and I will refer him to a pain specialist for evaluation of trigger point injections.  Health care maintenance His wife was treated with Harvoni last year and he would like to be screened for hepatitis C and HIV which I've done today.   Medications Ordered No orders of the defined types were  placed in this encounter.   Other Orders Orders Placed This Encounter  Procedures  . Hepatitis C antibody  . HIV antibody (with reflex)   Follow Up: Return in about 3 months (around  01/23/2016).

## 2015-10-23 NOTE — Assessment & Plan Note (Signed)
Assessment: His history of a generalized pruritic vesicular rash followed by superficial burning pain in the T11 dermatome sounds most suspicious for chickenpox followed by postherpetic neuralgia. Rib fracture was ruled out with a chest x-ray, and I seriously doubt this is symptomatic cholelithiasis given the history of tenderness to palpation. He also had a low risk stress test a few months ago.  Plan: I recommended he try topical capsaicin patches because these are more affordable than lidocaine patches. He already has gabapentin for his diabetic peripheral neuropathy; I advised him to take these at night as needed to help sleep. If he continues to have pain after 3 weeks, asked him to give [place] a call, and I will refer him to a pain specialist for evaluation of trigger point injections.

## 2015-10-24 ENCOUNTER — Other Ambulatory Visit: Payer: Self-pay | Admitting: Internal Medicine

## 2015-10-24 DIAGNOSIS — R768 Other specified abnormal immunological findings in serum: Secondary | ICD-10-CM | POA: Insufficient documentation

## 2015-10-24 LAB — HIV ANTIBODY (ROUTINE TESTING W REFLEX): HIV Screen 4th Generation wRfx: NONREACTIVE

## 2015-10-24 LAB — HEPATITIS C ANTIBODY: Hep C Virus Ab: >11 s/co ratio — ABNORMAL HIGH (ref 0.0–0.9)

## 2015-10-24 NOTE — Addendum Note (Signed)
Addended by: Orson Gear on: 10/24/2015 02:16 PM   Modules accepted: Orders

## 2015-10-25 LAB — SPECIMEN STATUS REPORT

## 2015-10-26 NOTE — Progress Notes (Signed)
Internal Medicine Clinic Attending  Case discussed with Dr. Flores soon after the resident saw the patient.  We reviewed the resident's history and exam and pertinent patient test results.  I agree with the assessment, diagnosis, and plan of care documented in the resident's note.  

## 2015-10-28 LAB — HCV RNA QUANT
HCV log10: 6.265 log10 IU/mL
Hepatitis C Quantitation: 1840000 IU/mL

## 2015-10-30 ENCOUNTER — Other Ambulatory Visit: Payer: Self-pay | Admitting: Internal Medicine

## 2015-10-30 DIAGNOSIS — B182 Chronic viral hepatitis C: Secondary | ICD-10-CM | POA: Insufficient documentation

## 2015-10-30 NOTE — Progress Notes (Signed)
Patient ID: Timothy Peters, male   DOB: 1951-08-08, 65 y.o.   MRN: [id number]  I informed [name] he has hepatitis C. He was surprised but understood the implications and that he would need to see infectious disease doctors because his wife was treated for hepatitis C in the past. I explained she may need to be screened again and I've referred him to over there today.

## 2015-11-23 ENCOUNTER — Telehealth: Payer: Self-pay | Admitting: Dietician

## 2015-11-23 NOTE — Telephone Encounter (Signed)
Spoke with referral coordinator- she needs a referral. No PCP has been designated. Will consult triage nurse

## 2015-11-23 NOTE — Telephone Encounter (Signed)
PCP is Dr. Charlynn Grimes, but he has not met Mr. Latterell yet. Appointment scheduled for Tuesday at 2:15 with Dr. Gordy Levan. Patient notified via a phone message. He was also mailed an appointment card for both his appointment next week and his  appointment with his PCP in July

## 2015-11-23 NOTE — Telephone Encounter (Addendum)
Called patient as part of project trying to help patient's with a1C between 8-9% lower their blood sugars to target  He says he is now getting diabetes medicine from [facility], fish oil not covered orange card. Confirmed medicines and doses. Our pharmacist says that Lovaza is available through [facility]. Timothy Peters says he feels better when he takes this and he has been out for more than a month now.    Blood sugars- a little high- one in the 200s- usually because of what he ate, Mostly 160-170, some 90s.  He says he has serious dental problems- symptoms- 8 teeth broke of to the gum, toothache, pain, cannot eat properly because his mouth is so sore. Has not heard from [place] about an appointment.   He is a still concerned about his pain in the side, was weed eating the other day and had the same burning pain in both sides with a few bumps, mostly bothers him on the right but also on the left now, mostly hurts at night and has tender spots.  He is willing to come in soon, wants to have eye exam and foot exam, diabetes educator and doctor visit at the same time on the same day.

## 2015-11-27 ENCOUNTER — Telehealth: Payer: Self-pay | Admitting: Internal Medicine

## 2015-11-27 NOTE — Telephone Encounter (Signed)
APPT. REMINDER CALL, LMTCB °

## 2015-11-28 ENCOUNTER — Encounter: Payer: Self-pay | Admitting: *Deleted

## 2015-11-28 ENCOUNTER — Encounter: Payer: Self-pay | Admitting: Internal Medicine

## 2015-11-28 ENCOUNTER — Ambulatory Visit (INDEPENDENT_AMBULATORY_CARE_PROVIDER_SITE_OTHER): Payer: Self-pay | Admitting: Internal Medicine

## 2015-11-28 ENCOUNTER — Other Ambulatory Visit: Payer: Self-pay

## 2015-11-28 VITALS — BP 132/75 | HR 87 | Temp 98.4°F | Ht 71.0 in | Wt 226.6 lb

## 2015-11-28 DIAGNOSIS — R1013 Epigastric pain: Secondary | ICD-10-CM

## 2015-11-28 DIAGNOSIS — K0889 Other specified disorders of teeth and supporting structures: Secondary | ICD-10-CM

## 2015-11-28 DIAGNOSIS — K029 Dental caries, unspecified: Secondary | ICD-10-CM

## 2015-11-28 DIAGNOSIS — G8929 Other chronic pain: Secondary | ICD-10-CM

## 2015-11-28 DIAGNOSIS — K08409 Partial loss of teeth, unspecified cause, unspecified class: Secondary | ICD-10-CM

## 2015-11-28 DIAGNOSIS — R1011 Right upper quadrant pain: Secondary | ICD-10-CM

## 2015-11-28 DIAGNOSIS — B182 Chronic viral hepatitis C: Secondary | ICD-10-CM

## 2015-11-28 MED ORDER — OMEPRAZOLE 20 MG PO CPDR: 20.0000 mg | DELAYED_RELEASE_CAPSULE | Freq: Every day | ORAL | Status: DC

## 2015-11-28 NOTE — Patient Instructions (Signed)
Thank you for your visit today.   Please return to the internal medicine clinic in 1-2 weeks if you pain has not improved or worsens.       I have made the following additions/changes to your medications:  I have given you omeprazole to take daily prior to breakfast.  Please pick up at the county pharmacy.  I have made the following referrals for you: Dentist.   Please be sure to bring all of your medications with you to every visit; this includes herbal supplements, vitamins, eye drops, and any over-the-counter medications.   Should you have any questions regarding your medications and/or any new or worsening symptoms, please be sure to call the clinic at [phone].   If you believe that you are suffering from a life threatening condition or one that may result in the loss of limb or function, then you should call 911 and proceed to the nearest Emergency Department.   A healthy lifestyle and preventative care can promote health and wellness.   Maintain regular health, dental, and eye exams.  Eat a healthy diet. Foods like vegetables, fruits, whole grains, low-fat dairy products, and lean protein foods contain the nutrients you need without too many calories. Decrease your intake of foods high in solid fats, added sugars, and salt. Get information about a proper diet from your caregiver, if necessary.  Regular physical exercise is one of the most important things you can do for your health. Most adults should get at least 150 minutes of moderate-intensity exercise (any activity that increases your heart rate and causes you to sweat) each week. In addition, most adults need muscle-strengthening exercises on 2 or more days a week.   Maintain a healthy weight. The body mass index (BMI) is a screening tool to identify possible weight problems. It provides an estimate of body fat based on height and weight. Your caregiver can help determine your BMI, and can help you achieve or maintain a  healthy weight. For adults 20 years and older:  A BMI below 18.5 is considered underweight.  A BMI of 18.5 to 24.9 is normal.  A BMI of 25 to 29.9 is considered overweight.  A BMI of 30 and above is considered obese.   Gastritis, Adult Gastritis is soreness and puffiness (inflammation) of the lining of the stomach. If you do not get help, gastritis can cause bleeding and sores (ulcers) in the stomach. HOME CARE   Only take medicine as told by your doctor.  If you were given antibiotic medicines, take them as told. Finish the medicines even if you start to feel better.  Drink enough fluids to keep your pee (urine) clear or pale yellow.  Avoid foods and drinks that make your problems worse. Foods you may want to avoid include:  Caffeine or alcohol.  Chocolate.  Mint.  Garlic and onions.  Spicy foods.  Citrus fruits, including oranges, lemons, or limes.  Food containing tomatoes, including sauce, chili, salsa, and pizza.  Fried and fatty foods.  Eat small meals throughout the day instead of large meals. GET HELP RIGHT AWAY IF:   You have black or dark red poop (stools).  You throw up (vomit) blood. It may look like coffee grounds.  You cannot keep fluids down.  Your belly (abdominal) pain gets worse.  You have a fever.  You do not feel better after 1 week.  You have any other questions or concerns. MAKE SURE YOU:   Understand these instructions.  Will watch  your condition.  Will get help right away if you are not doing well or get worse.   This information is not intended to replace advice given to you by your health care provider. Make sure you discuss any questions you have with your health care provider.   Document Released: 01/22/2008 Document Revised: 10/28/2011 Document Reviewed: 09/18/2011 Elsevier Interactive Patient Education [employer].

## 2015-11-28 NOTE — Progress Notes (Signed)
Patient ID: DSHON ORICK, male   DOB: [birth year], 65 y.o.   MRN: [id number]     Subjective:   Patient ID: TISHAUN SANTO male    DOB: 10/14/1950 65 y.o.    MRN: [id number] Health Maintenance Due: Health Maintenance Due  Topic Date Due  . OPHTHALMOLOGY EXAM  04/03/1962  . COLONOSCOPY  04/03/2002  . ZOSTAVAX  04/03/2012  . FOOT EXAM  07/27/2013    _________________________________________________  HPI: NAPOLEON YERENA is a 65 y.o. male here for an acute visit.  Pt has a PMH outlined below.  Please see problem-based charting assessment and plan for further status of patient's chronic medical problems addressed at today's visit.  PMH: Past Medical History  Diagnosis Date  . Diabetes mellitus without complication ([place])   . Hypertension   . Hyperlipidemia   . Erectile dysfunction   . Gallstone 06/16    on CT, also present on U/S in Jan 2017    Medications: Current Outpatient Prescriptions on File Prior to Visit  Medication Sig Dispense Refill  . aspirin 81 MG tablet Take 81 mg by mouth daily.    Marland Kitchen gabapentin (NEURONTIN) 300 MG capsule Take 300 mg by mouth 2 (two) times daily.    Marland Kitchen glipiZIDE (GLUCOTROL) 5 MG tablet Take 1 tablet (5 mg total) by mouth 2 (two) times daily before a meal. Take 1 tab daily 180 tablet 3  . glucose blood test strip Use as instructed 100 each 12  . Icosapent Ethyl (VASCEPA) 1 G CAPS Take 2 capsules by mouth 2 (two) times daily. 120 capsule 11  . Insulin Glargine (TOUJEO SOLOSTAR) 300 UNIT/ML SOPN Inject 80 Units into the skin daily. 5 pen 11  . Insulin Pen Needle 31G X 5 MM MISC 1 Units by Does not apply route as directed. 100 each 11  . lisinopril (PRINIVIL,ZESTRIL) 10 MG tablet Take 1 tablet (10 mg total) by mouth daily. 30 tablet 11  . metFORMIN (GLUCOPHAGE) 1000 MG tablet Take 1 tablet (1,000 mg total) by mouth 2 (two) times daily with a meal. Take 1 tab daily 180 tablet 3  . metoprolol tartrate (LOPRESSOR) 25 MG tablet Take 0.5 tablets  (12.5 mg total) by mouth 2 (two) times daily. 90 tablet 3  . Multiple Vitamins-Minerals (MULTIVITAMIN WITH MINERALS) tablet Take 1 tablet by mouth daily.    . sildenafil (VIAGRA) 100 MG tablet Take 100 mg by mouth daily as needed for erectile dysfunction.    . simvastatin (ZOCOR) 20 MG tablet Take 1 tablet (20 mg total) by mouth daily. 90 tablet 3  . traMADol (ULTRAM) 50 MG tablet Use as needed for pain  0   No current facility-administered medications on file prior to visit.    Allergies: No Known Allergies  FH: Family History  Problem Relation Age of Onset  . Hypertension Mother   . Stroke Mother   . Heart attack Father   . Cancer Sister   . Cancer Brother   . Diabetes Other   . Hypertension Other   . Hyperlipidemia Other   . Heart attack Maternal Grandmother   . Hypertension Maternal Grandfather   . Stroke Paternal Grandmother   . Stroke Paternal Grandfather     SH: Social History   Social History  . Marital Status: Married    Spouse Name: N/A  . Number of Children: N/A  . Years of Education: N/A   Social History Main Topics  . Smoking status: Former Smoker    Quit date:  11/30/2007  . Smokeless tobacco: Not on file  . Alcohol Use: No     Comment: Quit x 13 yrs.  . Drug Use: No  . Sexual Activity: Not on file   Other Topics Concern  . Not on file   Social History Narrative    Review of Systems: Constitutional: Negative for fever, chills.  Eyes: +blurred vision.  Respiratory: Negative for cough and shortness of breath.  Cardiovascular: Negative for chest pain.  Gastrointestinal: Negative for nausea, vomiting. Neurological: Negative for dizziness.   Objective:   Vital Signs: Filed Vitals:   11/28/15 1426  BP: 132/75  Pulse: 87  Temp: 98.4 F (36.9 C)  TempSrc: Oral  Height: 5\' 11"  (1.803 m)  Weight: 226 lb 9.6 oz (102.785 kg)  SpO2: 99%      BP Readings from Last 3 Encounters:  11/28/15 132/75  10/23/15 148/75  09/21/15 158/75     Physical Exam: Constitutional: Vital signs reviewed.  Patient is in NAD and cooperative with exam.  Head: Normocephalic and atraumatic. Eyes: EOMI, conjunctivae nl, no scleral icterus.  Oral Cavity: Multiple missing teeth with most broken off.  Multiple dental caries.  Neck: Supple. Cardiovascular: RRR, no MRG. Abdomen: +BS,  Pulmonary/Chest: Normal effort.  Neurological: A&O x3, cranial nerves II-XII are grossly intact, moving all extremities. Skin: Warm, dry and intact.    Assessment & Plan:   Assessment and plan was discussed and formulated with my attending.

## 2015-11-28 NOTE — Assessment & Plan Note (Signed)
Pt was in prison previously and had 13 teeth removed.  He has only 2 bottom teeth but has multiple teeth that are broken off and the bottom right area is painful.  No abscess noted. -referral to dentistry

## 2015-11-28 NOTE — Assessment & Plan Note (Addendum)
Pt has been experiencing RUQ pain that radiates to the left and also to the back.  Denies any fever/chills, N/V, decreased appetite, weight loss.  Pain has been worsening over the past 3 months and is described as a sharp, burning pain that comes and goes.  No relation to food that he has observed.  Denies ETOH, smoking, or illicit drug use.  Denies h/o GERD.  No diarrhea, constipation, chest pain.  Of note, he was told to use 800mg  of ibuprofen daily for the pain which he has been doing.  He has difficulty sleeping due to the pain.  RUQ [place] in January 2017 revealed cholelithiasis without acute cholecystitis.  LIkely etiology of his pain is gallstones but could also be related to NSAID use and possible gastritis.  On exam he is tender in the RUQ and epigastric area.   -will try omeprazole 20mg  daily x 1-2wk -f/u in 1-2 wks if no improvement  -may need referral to general surgery but not available to him given orange card  -avoid NSAIDS  -check CMP, cbc/diff, lipase

## 2015-11-29 LAB — CMP14 + ANION GAP
ALT: 28 IU/L (ref 0–44)
AST: 30 IU/L (ref 0–40)
Albumin/Globulin Ratio: 1.1 — ABNORMAL LOW (ref 1.2–2.2)
Albumin: 4.3 g/dL (ref 3.6–4.8)
Alkaline Phosphatase: 71 IU/L (ref 39–117)
Anion Gap: 17 mmol/L (ref 10.0–18.0)
BUN/Creatinine Ratio: 22 (ref 10–24)
BUN: 16 mg/dL (ref 8–27)
Bilirubin Total: 0.4 mg/dL (ref 0.0–1.2)
CO2: 21 mmol/L (ref 18–29)
Calcium: 9.6 mg/dL (ref 8.6–10.2)
Chloride: 103 mmol/L (ref 96–106)
Creatinine, Ser: 0.74 mg/dL — ABNORMAL LOW (ref 0.76–1.27)
GFR calc Af Amer: 113 mL/min/{1.73_m2} (ref >59)
GFR calc non Af Amer: 98 mL/min/{1.73_m2} (ref >59)
Globulin, Total: 3.8 g/dL (ref 1.5–4.5)
Glucose: 176 mg/dL — ABNORMAL HIGH (ref 65–99)
Potassium: 4.5 mmol/L (ref 3.5–5.2)
Sodium: 141 mmol/L (ref 134–144)
Total Protein: 8.1 g/dL (ref 6.0–8.5)

## 2015-11-29 LAB — CBC
Hematocrit: 42.1 % (ref 37.5–51.0)
Hemoglobin: 13.9 g/dL (ref 12.6–17.7)
MCH: 29.4 pg (ref 26.6–33.0)
MCHC: 33 g/dL (ref 31.5–35.7)
MCV: 89 fL (ref 79–97)
Platelets: 262 10*3/uL (ref 150–379)
RBC: 4.73 x10E6/uL (ref 4.14–5.80)
RDW: 13.8 % (ref 12.3–15.4)
WBC: 5.6 10*3/uL (ref 3.4–10.8)

## 2015-11-29 LAB — LIPASE: Lipase: 47 U/L (ref 0–59)

## 2015-11-29 NOTE — Progress Notes (Signed)
Case discussed with Dr. Gordy Levan soon after the resident saw the patient. We reviewed the resident's history and exam and pertinent patient test results. I agree with the assessment, diagnosis, and plan of care documented in the resident's note.  CBC w/o leukocytosis and LFTs w/o transaminitis or elevated alk phos to suggest impacted gallstone or cholecystitis.  With orange card currently no general surgery slots available.  Will likely require cholecystectomy for symptomatic gallstones in the future, but will wait for an open slot as there is no urgency at this time and finances remain an issue.

## 2015-12-04 ENCOUNTER — Other Ambulatory Visit: Payer: Self-pay

## 2015-12-04 DIAGNOSIS — B182 Chronic viral hepatitis C: Secondary | ICD-10-CM

## 2015-12-04 LAB — PROTIME-INR
INR: 0.98 (ref <1.50)
Prothrombin Time: 13.1 seconds (ref 11.6–15.2)

## 2015-12-05 LAB — HEPATITIS A ANTIBODY, TOTAL: Hep A Total Ab: REACTIVE — AB

## 2015-12-05 LAB — HEPATITIS B CORE ANTIBODY, TOTAL: Hep B Core Total Ab: REACTIVE — AB

## 2015-12-05 LAB — ANA: Anti Nuclear Antibody(ANA): NEGATIVE

## 2015-12-05 LAB — HEPATITIS B SURFACE ANTIGEN: Hepatitis B Surface Ag: NEGATIVE

## 2015-12-05 LAB — HEPATITIS B SURFACE ANTIBODY,QUALITATIVE: Hep B S Ab: NEGATIVE

## 2015-12-05 LAB — IRON: Iron: 82 ug/dL (ref 50–180)

## 2015-12-19 ENCOUNTER — Telehealth: Payer: Self-pay | Admitting: Pharmacy Technician

## 2015-12-19 NOTE — Telephone Encounter (Signed)
Left Vm with Bonna Gains at [facility] to see if she will fax finan. Doc's so we can get assist. For Hep C med.

## 2015-12-20 ENCOUNTER — Encounter: Payer: Self-pay | Admitting: Internal Medicine

## 2015-12-20 ENCOUNTER — Ambulatory Visit (INDEPENDENT_AMBULATORY_CARE_PROVIDER_SITE_OTHER): Payer: Self-pay | Admitting: Internal Medicine

## 2015-12-20 VITALS — BP 126/80 | HR 82 | Temp 98.0°F | Wt 222.0 lb

## 2015-12-20 DIAGNOSIS — B182 Chronic viral hepatitis C: Secondary | ICD-10-CM

## 2015-12-20 NOTE — Patient Instructions (Signed)
Date 12/20/2015  Dear Mr. Duecker, As discussed in the [facility], your hepatitis C therapy will include the following medications:          Zepatier (elbasvir 50 mg/grazoprevir 100 mg) for 12 weeks              OR      16 weeks with ribavirin in certain cases   Please note that ALL MEDICATIONS WILL START ON THE SAME DATE for a total of 12 weeks. ---------------------------------------------------------------- Your HCV Treatment Start Date: TBA   Your HCV genotype:  unknown    Liver Fibrosis: TBD    ---------------------------------------------------------------- YOUR PHARMACY CONTACT:   [place] [facility] [place] Phone: [phone] Hours: Monday to Friday 7:30 am to 6:00 pm   Please always contact your pharmacy at least 3-4 business days before you run out of medications to ensure your next month's medication is ready or 1 week prior to running out if you receive it by mail.  Remember, each prescription is for 28 days. ---------------------------------------------------------------- GENERAL NOTES REGARDING YOUR HEPATITIS C MEDICATION:  ZEPATIER is available as a beige-colored, oval-shaped, film-coated tablet debossed with "770" on one side and plain on the other. Each tablet contains 50 mg elbasvir and 100 mg grazoprevir.  Common side effects of ZEPATIER when used without ribavirin include: - feeling tired -trouble sleeping - headache -diarrhea - nausea  Common side effects of ZEPATIER when used with ribavirin include: - low red blood cell counts (anemia) - feeling irritable - headache - stomach pain - feeling tired - depression - shortness of breath - joint pain - rash or itching  Please note that this only lists the most common side effects and is NOT a comprehensive list of the potential side effects of these medications. For more information, please review the drug information sheets that come with  your medication package from the pharmacy.  ---------------------------------------------------------------- GENERAL HELPFUL HINTS ON HCV THERAPY: 1. Stay well-hydrated. 2. Notify the [facility] of any changes in your other over-the-counter/herbal or prescription medications. 3. If you miss a dose of your medication, take the missed dose as soon as you remember. Return to your regular time/dose schedule the next day.  4.  Do not stop taking your medications without first talking with your healthcare provider. 5.  You may take Tylenol (acetaminophen), as long as the dose is less than 2000 mg (OR no more than 4 tablets of the Tylenol Extra Strengths 500mg  tablet) in 24 hours. 6.  You will see our pharmacist-specialist within the first 2 weeks of starting your medication. 7.  You will need to obtain routine labs around week 4 and12 weeks after starting and then 3 to 6 months after finishing Zepatier.   8.  If ribavirin is part of your regimen, you also will have a lab visit within 2 weeks.   Scharlene Gloss, [place] for [place] [facility], [place]  [phone]

## 2015-12-20 NOTE — Progress Notes (Signed)
[name]   CC: consideration for treatment for chronic hepatitis C  HPI:  +Timothy Peters is a 65 y.o. male who presents for initial evaluation and management of chronic hepatitis C.  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  He has been clean since after jail time for about 7 years. Hepatitis C-associated risk factors present are: IV drug abuse (details: last used 15 years ago). Patient denies renal dialysis, sexual contact with person with liver disease, tattoos. Patient has had other studies performed. Results: hepatitis C RNA by PCR, result: positive. Patient has not had prior treatment for Hepatitis C. Patient does not have a past history of liver disease. Patient does not have a family history of liver disease. Patient does not  have associated signs or symptoms related to liver disease.  Labs reviewed and confirm chronic hepatitis C with a positive viral load.   Records reviewed from PCP.       Patient does have documented immunity to Hepatitis A. Patient does have documented immunity to Hepatitis B.    Review of Systems:  Constitutional: negative for fatigue and malaise Gastrointestinal: negative for diarrhea Musculoskeletal: negative for myalgias and arthralgias All other systems reviewed and are negative      Past Medical History  Diagnosis Date  . Diabetes mellitus without complication ([place])   . Hypertension   . Hyperlipidemia   . Erectile dysfunction   . Gallstone 06/16    on CT, also present on U/S in Jan 2017    Prior to Admission medications   Medication Sig Start Date End Date Taking? Authorizing Provider  aspirin 81 MG tablet Take 81 mg by mouth daily.   Yes Historical Provider, MD  gabapentin (NEURONTIN) 300 MG capsule Take 300 mg by mouth 2 (two) times daily.   Yes Historical Provider, MD  glipiZIDE (GLUCOTROL) 5 MG tablet Take 1 tablet (5 mg total) by mouth 2 (two) times daily before a meal. Take 1 tab  daily 09/21/15  Yes Lucious Groves, DO  glucose blood test strip Use as instructed 09/01/12  Yes Emory Carollee Leitz, MD  Insulin Glargine (TOUJEO SOLOSTAR) 300 UNIT/ML SOPN Inject 80 Units into the skin daily. 09/21/15  Yes Lucious Groves, DO  Insulin Pen Needle 31G X 5 MM MISC 1 Units by Does not apply route as directed. 09/21/15  Yes Lucious Groves, DO  lisinopril (PRINIVIL,ZESTRIL) 10 MG tablet Take 1 tablet (10 mg total) by mouth daily. 10/12/15 10/11/16 Yes Lucious Groves, DO  metFORMIN (GLUCOPHAGE) 1000 MG tablet Take 1 tablet (1,000 mg total) by mouth 2 (two) times daily with a meal. Take 1 tab daily 10/13/15  Yes Bartholomew Crews, MD  metoprolol tartrate (LOPRESSOR) 25 MG tablet Take 0.5 tablets (12.5 mg total) by mouth 2 (two) times daily. 09/21/15 09/20/16 Yes Lucious Groves, DO  Multiple Vitamins-Minerals (MULTIVITAMIN WITH MINERALS) tablet Take 1 tablet by mouth daily.   Yes Historical Provider, MD  sildenafil (VIAGRA) 100 MG tablet Take 100 mg by mouth daily as needed for erectile dysfunction.   Yes Historical Provider, MD  simvastatin (ZOCOR) 20 MG tablet Take 1 tablet (20 mg total) by mouth daily. 09/21/15  Yes Lucious Groves, DO  Icosapent Ethyl (VASCEPA) 1 G CAPS Take 2 capsules by mouth 2 (two) times daily. Patient not taking: Reported on 12/20/2015 04/11/15   Troy Sine, MD  omeprazole (PRILOSEC) 20 MG capsule Take 1 capsule (20  mg total) by mouth daily before breakfast. Patient not taking: Reported on 12/20/2015 11/28/15   Jones Bales, MD  traMADol Veatrice Bourbon) 50 MG tablet Reported on 12/20/2015 09/04/15   Historical Provider, MD    No Known Allergies  Social History  Substance Use Topics  . Smoking status: Former Smoker    Quit date: 11/30/2007  . Smokeless tobacco: None  . Alcohol Use: No     Comment: Quit x 13 yrs.    Family History  Problem Relation Age of Onset  . Hypertension Mother   . Stroke Mother   . Heart attack Father   . Cancer Sister   . Cancer Brother   . Diabetes  Other   . Hypertension Other   . Hyperlipidemia Other   . Heart attack Maternal Grandmother   . Hypertension Maternal Grandfather   . Stroke Paternal Grandmother   . Stroke Paternal Grandfather    No cirrhosis   Objective:  Constitutional: in no apparent distress and alert,  Filed Vitals:   12/20/15 1538  BP: 126/80  Pulse: 82  Temp: 98 F (36.7 C)   Eyes: anicteric Cardiovascular: Cor RRR and No murmurs Respiratory: CTA B; normal respiratory effort Gastrointestinal: Bowel sounds are normal, liver is not enlarged, spleen is not enlarged Musculoskeletal: no pedal edema noted Skin: negatives: no rash; no porphyria cutanea tarda Lymphatic: no cervical lymphadenopathy   Laboratory Genotype: No results found for: HCVGENOTYPE HCV viral load: No results found for: HCVQUANT Lab Results  Component Value Date   WBC 5.6 11/28/2015   HGB 13.6 04/06/2015   HCT 42.1 11/28/2015   MCV 89 11/28/2015   PLT 262 11/28/2015    Lab Results  Component Value Date   CREATININE 0.74* 11/28/2015   BUN 16 11/28/2015   NA 141 11/28/2015   K 4.5 11/28/2015   CL 103 11/28/2015   CO2 21 11/28/2015    Lab Results  Component Value Date   ALT 28 11/28/2015   AST 30 11/28/2015   ALKPHOS 71 11/28/2015     Labs and history reviewed and show CHILD-PUGH A  5-6 points: Child class A 7-9 points: Child class B 10-15 points: Child class C  Lab Results  Component Value Date   INR 0.98 12/04/2015   BILITOT 0.4 11/28/2015   ALBUMIN 4.3 11/28/2015     Assessment: New Patient with Chronic Hepatitis C genotype unknown, untreated.  I discussed with the patient the lab findings that confirm chronic hepatitis C as well as the natural history and progression of disease including about 30% of people who develop cirrhosis of the liver if left untreated and once cirrhosis is established there is a 2-7% risk per year of liver cancer and liver failure.  I discussed the importance of treatment and benefits  in reducing the risk, even if significant liver fibrosis exists.   Plan: 1) Patient counseled extensively on limiting acetaminophen to no more than 2 grams daily, avoidance of alcohol. 2) Transmission discussed with patient including sexual transmission, sharing razors and toothbrush.   3) Will need referral to gastroenterology if concern for cirrhosis 4) Will need referral for substance abuse counseling: No.; Further work up to include urine drug screen  No. 5) Will prescribe Zepatier for 12 weeks or 16 weeks with ribavirin if any NS5A resistance found 6) Hepatitis A vaccine No. 7) Hepatitis B vaccine No. 8) Pneumovax vaccine if concern for cirrhosis 9) Further work up to include liver staging with elastography 10) NS5A test  Yes.  ,  if he has genotype 1a, will check genotype today Will send prescription once genotype is back 11) will follow up after starting medication

## 2015-12-22 ENCOUNTER — Telehealth: Payer: Self-pay | Admitting: *Deleted

## 2015-12-22 ENCOUNTER — Ambulatory Visit (INDEPENDENT_AMBULATORY_CARE_PROVIDER_SITE_OTHER): Payer: Self-pay | Admitting: Internal Medicine

## 2015-12-22 ENCOUNTER — Encounter: Payer: Self-pay | Admitting: Internal Medicine

## 2015-12-22 VITALS — BP 135/67 | HR 73 | Temp 98.2°F | Ht 71.0 in | Wt 224.5 lb

## 2015-12-22 DIAGNOSIS — IMO0002 Reserved for concepts with insufficient information to code with codable children: Secondary | ICD-10-CM

## 2015-12-22 DIAGNOSIS — R0781 Pleurodynia: Secondary | ICD-10-CM

## 2015-12-22 DIAGNOSIS — E1140 Type 2 diabetes mellitus with diabetic neuropathy, unspecified: Secondary | ICD-10-CM

## 2015-12-22 DIAGNOSIS — E1165 Type 2 diabetes mellitus with hyperglycemia: Secondary | ICD-10-CM

## 2015-12-22 LAB — POCT GLYCOSYLATED HEMOGLOBIN (HGB A1C): Hemoglobin A1C: 8.6

## 2015-12-22 LAB — GLUCOSE, CAPILLARY: Glucose-Capillary: 140 mg/dL — ABNORMAL HIGH (ref 65–99)

## 2015-12-22 NOTE — Progress Notes (Signed)
   Subjective:    Patient ID: Timothy Peters, male    DOB: October 30, 1950, 65 y.o.   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 Please see assessment & plan for status of chronic medical problems.    Review of Systems  Constitutional: Negative for appetite change and unexpected weight change.  Respiratory: Negative for shortness of breath.   Cardiovascular: Negative for chest pain.  Gastrointestinal: Positive for abdominal pain. Negative for nausea, vomiting, diarrhea, constipation and abdominal distention.       Objective:   Physical Exam  Constitutional: He is oriented to person, place, and time. He appears well-developed and well-nourished.  HENT:  Head: Normocephalic and atraumatic.  Eyes: Conjunctivae are normal. No scleral icterus.  Neck: No tracheal deviation present.  Cardiovascular: Normal rate and regular rhythm.   Pulmonary/Chest: Effort normal. No stridor. No respiratory distress.  Abdominal: Soft. Bowel sounds are normal. He exhibits no distension and no mass. There is tenderness (Focal tenderness noted at the intersection of the subcostal margin and nipple line bilaterally). There is no rebound and no guarding.  Obese abdomen  Neurological: He is alert and oriented to person, place, and time.  Skin: Skin is warm and dry. No rash noted.          Assessment & Plan:

## 2015-12-22 NOTE — Telephone Encounter (Signed)
Pt calls and states [facility] cannot get vascepa and needs it changed to lovaza 1 gram, 2 capsules twice daily, can we get this sent to [facility]? Please advise

## 2015-12-22 NOTE — Patient Instructions (Addendum)
For your rib pain, continue using the cream. We will order X-rays to take a look inside.   Please see me back sometime next week to review the images together.

## 2015-12-22 NOTE — Assessment & Plan Note (Addendum)
Overview He expresses frustration to me over multiple providers have offered differing hypotheses regarding the etiology of his right rib pain which is now evolved to the left side. He notes the onset of this pain for months ago cannot describe what he was doing at this time. He says the location symmetric at the intersection of the nipple line in the subcostal margin. He scores the pain 8/10 and improves to 2-3/10 with horse liniment, a remedy he picked up at the veterinary store useful for people suffering from arthritic pain. Pain is made worse with sitting and lying supine. Sometimes, it worsens with deep breath. There is no variation to the pain with heavy lifting which is why he does not feel it is related to musculoskeletal causes, and omeprazole prescribed at his last visit also had no effect. He also denies symptoms of heartburn, nausea, vomiting, changes in bowel movement, changes in appetite. He also denies any history of major trauma or accidents. 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  Assessment I suspect a musculoskeletal cause is the source of his pain given the fact that it has improved with this cream he is using and that it worsens with posture. The pleuritic component is a bit puzzling to me, and he has not had a chest x-ray since January 2016 which was unremarkable for rib fractures. The bilateral distribution of his pain does raise concern for a radiculopathy though would be very odd as to why he began on one side and then started on the other.  Plan -Encouraged him to continue using horse liniment cream as the herbal ingredients do not appear to have any cross interactions with the medications he is currently taking -Order chest x-ray, lumbar x-ray, thoracic x-ray for initial imaging though may consider MR imaging if radiculopathy becomes more suspect

## 2015-12-22 NOTE — Assessment & Plan Note (Signed)
Overview He is due to recheck A1c today.  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 Given his other comorbidities and age, his glycemic goal should be an A1c less than 7.  Plan Check A1c today  ADDENDUM 12/22/2015  5:54 PM:  A1c 8.6, essentially stable from February with A1c 8.3 at that time.

## 2015-12-24 LAB — HCV RNA, QUANT REAL-TIME PCR W/REFLEX
HCV RNA, PCR, QN (Log): 5.94 LogIU/mL — ABNORMAL HIGH
HCV RNA, PCR, QN: 861000 IU/mL — ABNORMAL HIGH

## 2015-12-24 LAB — HCV RNA,LIPA RFLX NS5A DRUG RESIST

## 2015-12-25 ENCOUNTER — Ambulatory Visit (HOSPITAL_COMMUNITY)
Admission: RE | Admit: 2015-12-25 | Discharge: 2015-12-25 | Disposition: A | Discharge status: Home / Self Care | Payer: Self-pay | Source: Ambulatory Visit | Attending: Internal Medicine | Admitting: Internal Medicine

## 2015-12-25 DIAGNOSIS — R0781 Pleurodynia: Secondary | ICD-10-CM | POA: Insufficient documentation

## 2015-12-25 DIAGNOSIS — M5136 Other intervertebral disc degeneration, lumbar region: Secondary | ICD-10-CM | POA: Insufficient documentation

## 2015-12-25 DIAGNOSIS — R918 Other nonspecific abnormal finding of lung field: Secondary | ICD-10-CM | POA: Insufficient documentation

## 2015-12-25 DIAGNOSIS — R937 Abnormal findings on diagnostic imaging of other parts of musculoskeletal system: Secondary | ICD-10-CM | POA: Insufficient documentation

## 2015-12-26 MED ORDER — OMEGA-3-ACID ETHYL ESTERS 1 G PO CAPS: 2.0000 g | ORAL_CAPSULE | Freq: Two times a day (BID) | ORAL | Status: DC

## 2015-12-26 NOTE — Telephone Encounter (Signed)
Changed to lovaza. Thanks.

## 2015-12-27 ENCOUNTER — Other Ambulatory Visit: Payer: Self-pay | Admitting: Pharmacist Clinician (PhC)/ Clinical Pharmacy Specialist

## 2015-12-27 LAB — HCV RNA NS5A DRUG RESISTANCE

## 2015-12-27 MED ORDER — ELBASVIR-GRAZOPREVIR 50-100 MG PO TABS: 1.0000 | ORAL_TABLET | Freq: Every day | ORAL | Status: DC

## 2015-12-27 NOTE — Progress Notes (Signed)
Case discussed with Dr. Posey Pronto at the time of the visit.  We reviewed the resident's history and exam and pertinent patient test results.  I agree with the assessment, diagnosis and plan of care documented in the resident's note.  Timothy Peters is scheduled to see Dr. Posey Pronto on 12/29/2015 at which point his diabetes regimen will be addressed.  Prandial insulin may now be in order if he is to achieve the target Hgb A1C.  We will also fix his diabetes prescriptions which have instructions that are contradictory and therefore are likely leading to confusion.

## 2015-12-28 ENCOUNTER — Other Ambulatory Visit: Payer: Self-pay | Admitting: Pharmacist Clinician (PhC)/ Clinical Pharmacy Specialist

## 2015-12-28 MED ORDER — ELBASVIR-GRAZOPREVIR 50-100 MG PO TABS: 1.0000 | ORAL_TABLET | Freq: Every day | ORAL | Status: DC

## 2015-12-29 ENCOUNTER — Encounter: Payer: Self-pay | Admitting: Internal Medicine

## 2015-12-29 ENCOUNTER — Telehealth: Payer: Self-pay | Admitting: Dietician

## 2015-12-29 ENCOUNTER — Ambulatory Visit (INDEPENDENT_AMBULATORY_CARE_PROVIDER_SITE_OTHER): Payer: Self-pay | Admitting: Internal Medicine

## 2015-12-29 VITALS — BP 132/66 | HR 65 | Temp 97.9°F | Ht 71.0 in | Wt 225.0 lb

## 2015-12-29 DIAGNOSIS — Z794 Long term (current) use of insulin: Secondary | ICD-10-CM

## 2015-12-29 DIAGNOSIS — R1011 Right upper quadrant pain: Secondary | ICD-10-CM

## 2015-12-29 DIAGNOSIS — E113392 Type 2 diabetes mellitus with moderate nonproliferative diabetic retinopathy without macular edema, left eye: Secondary | ICD-10-CM

## 2015-12-29 DIAGNOSIS — M5380 Other specified dorsopathies, site unspecified: Secondary | ICD-10-CM

## 2015-12-29 DIAGNOSIS — E1165 Type 2 diabetes mellitus with hyperglycemia: Secondary | ICD-10-CM

## 2015-12-29 DIAGNOSIS — IMO0002 Reserved for concepts with insufficient information to code with codable children: Secondary | ICD-10-CM

## 2015-12-29 DIAGNOSIS — M539 Dorsopathy, unspecified: Secondary | ICD-10-CM | POA: Insufficient documentation

## 2015-12-29 DIAGNOSIS — E113291 Type 2 diabetes mellitus with mild nonproliferative diabetic retinopathy without macular edema, right eye: Secondary | ICD-10-CM

## 2015-12-29 DIAGNOSIS — E1140 Type 2 diabetes mellitus with diabetic neuropathy, unspecified: Secondary | ICD-10-CM

## 2015-12-29 DIAGNOSIS — R1012 Left upper quadrant pain: Secondary | ICD-10-CM

## 2015-12-29 DIAGNOSIS — G8929 Other chronic pain: Secondary | ICD-10-CM

## 2015-12-29 LAB — HM DIABETES EYE EXAM

## 2015-12-29 LAB — GLUCOSE, CAPILLARY: Glucose-Capillary: 141 mg/dL — ABNORMAL HIGH (ref 65–99)

## 2015-12-29 MED ORDER — GLIPIZIDE 5 MG PO TABS: ORAL_TABLET | ORAL | Status: DC

## 2015-12-29 MED ORDER — GLUCOSE BLOOD VI STRP: ORAL_STRIP | Status: DC

## 2015-12-29 MED ORDER — GLIPIZIDE 5 MG PO TABS
5.0000 mg | ORAL_TABLET | Freq: Every day | ORAL | Status: DC
Start: 2015-12-29 — End: 2015-12-29

## 2015-12-29 NOTE — Patient Instructions (Signed)
Please see me next week back with your blood sugar log.   Remember to check your sugars in the morning before you eat.  Before you take glipizide, check your sugar as well. DO NOT take this medication if your sugar is less than 150.

## 2015-12-29 NOTE — Telephone Encounter (Signed)
Called to [facility] 

## 2015-12-29 NOTE — Assessment & Plan Note (Addendum)
Overview Back imaging obtained following his last office visit was notable for mild multilevel degenerative disc disease though a mild compression deformity is noted involving the superior endplate of the L4 vertebral body. He denies prior history of back injury given that he used to play football though does complain of occasional right-sided hip pain.  Assessment Multilevel degenerative disc disease of mild severity though compression deformity of L4 is somewhat suspicious and raises concern for osteoporosis.  Plan Check vitamin D today  ADDENDUM 01/01/2016  1:30 PM:  Vitamin D 38.1 which is slightly above the upper limit of normal.

## 2015-12-29 NOTE — Assessment & Plan Note (Signed)
Overview Today, he has not brought his glucometer with him due to the fact it malfunctioned following being exposed to water. He is not sure if it will become functional again those optimistic it will and would like for [place] to refill his test strips today. He does report adherence to metformin 1000 mg twice daily, Toujeo 80 units at bedtime, glipizide 5 mg daily. We corrected the conflicting instructions in the [facility], and he takes his medication with breakfast. He is unclear of how frequently he should check his sugars as he has been told multiple things in the past, everything from 4 times daily to none at all. He recalls that his sugars generally trend 160-180 in the morning before breakfast. He does recall several values falling below 100 and associates this with the feeling of anxiousness. Overall, he feels his glycemic control has deteriorated primarily because he is no longer as physically active as he was before some of which he attributes to his abdominal wall pain.  Assessment Poorly controlled type 2 diabetes on triple therapy with glycemic goal A1c less than 7%.  Plan -Encouraged him to bring in his glucometer at his next visit. Instructed him to check his blood sugar every morning before breakfast to assess his fasting levels so we may titrate his Toujeo accordingly along with checking before the meal at which she will consume glipizide to ensure CBG is not less than 150 at which time he should hold glipizide -Encouraged him to continue metformin 1000 mg twice daily and to take glipizide 5 mg daily either with lunch or dinner. -Perform retinopathy screen today -Follow-up next week to reassess glycemic control

## 2015-12-29 NOTE — Telephone Encounter (Signed)
Asked about helping patient to replace his blood glucose meter that fell into water and is no longer working. He got his meter at the health department. Diabetes educator Left message for health department to see if they would issue or sell him another wave sense pretso meter.

## 2015-12-29 NOTE — Assessment & Plan Note (Addendum)
Overview Since last visit, he feels today is right sided pain is at its best and scores a 3/10. He has not applied any horse liniment to it today. He does complain of the left side as well.  Assessment Chronic abdominal wall pain. His right side would constitute a positive Carnett test and thus amenable to trigger point injection. Unclear of his left side for now.  Plan Perform trigger point injection today for both diagnostic and therapeutic purpose on the right side and consider for the left side if successful  Informed consent was collected prior to injection. An area marked by the intersection of the right subcostal margin and nipple line was identified as injection site. The area was disinfected with alcohol swabs. 1 mL of 1% lidocaine was mixed with 1 mL of triamcinolone 40 mg. A 16 mm 27-gauge needle was passed perpendicularly into a fold of lightly pinched skin and subcutaneous tissue at the intersection of the right subcostal margin and nipple line. 1 mL was then injected into the subcutaneous site. Estimated blood loss was minimal.

## 2015-12-29 NOTE — Progress Notes (Signed)
   Subjective:    Patient ID: Timothy Peters, male    DOB: Jul 29, 1951, 65 y.o.   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 Please see assessment & plan for status of chronic medical problems.   Review of Systems  Constitutional: Negative for appetite change and unexpected weight change.  Respiratory: Negative for shortness of breath.   Cardiovascular: Negative for chest pain.  Gastrointestinal: Positive for abdominal pain. Negative for nausea, vomiting, diarrhea, constipation and abdominal distention.  Musculoskeletal: Negative for back pain.  Neurological: Negative for dizziness.  Psychiatric/Behavioral: The patient is nervous/anxious.        Objective:   Physical Exam  Constitutional: He is oriented to person, place, and time. He appears well-developed and well-nourished.  HENT:  Head: Normocephalic and atraumatic.  Eyes: Conjunctivae are normal. No scleral icterus.  Neck: No tracheal deviation present.  Cardiovascular: Normal rate and regular rhythm.   Pulmonary/Chest: Effort normal. No stridor. No respiratory distress.  Abdominal: Soft. Bowel sounds are normal. He exhibits no distension and no mass. There is tenderness (Discomfort is reproduced with straining of the abdominal wall muscles on the right side with head elevation though not on the left.). There is no rebound and no guarding.  Obese abdomen  Neurological: He is alert and oriented to person, place, and time.  Skin: Skin is warm and dry. No rash noted.          Assessment & Plan:

## 2015-12-30 LAB — VITAMIN D 25 HYDROXY (VIT D DEFICIENCY, FRACTURES): Vit D, 25-Hydroxy: 38.1 ng/mL (ref 30.0–100.0)

## 2015-12-30 NOTE — Progress Notes (Signed)
I saw and evaluated the patient.  I personally confirmed the key portions of Dr. Serita Grit history and exam and reviewed pertinent patient test results.  The assessment, diagnosis, and plan were formulated together and I agree with the documentation in the resident's note.  I was present at the bedside for the entire trigger point injection.  The patient tolerated the procedure well.  Please note that the gauge of the needle used was 25.

## 2016-01-01 ENCOUNTER — Other Ambulatory Visit: Payer: Self-pay | Admitting: Internal Medicine

## 2016-01-01 MED ORDER — ELBASVIR-GRAZOPREVIR 50-100 MG PO TABS: 1.0000 | ORAL_TABLET | Freq: Every day | ORAL | Status: DC

## 2016-01-02 ENCOUNTER — Encounter: Payer: Self-pay | Admitting: Pharmacy Technician

## 2016-01-05 ENCOUNTER — Encounter: Payer: Self-pay | Admitting: Dietician

## 2016-01-16 ENCOUNTER — Ambulatory Visit (INDEPENDENT_AMBULATORY_CARE_PROVIDER_SITE_OTHER): Payer: Self-pay | Admitting: Pharmacist Clinician (PhC)/ Clinical Pharmacy Specialist

## 2016-01-16 ENCOUNTER — Ambulatory Visit (HOSPITAL_COMMUNITY)
Admission: RE | Admit: 2016-01-16 | Discharge: 2016-01-16 | Disposition: A | Discharge status: Home / Self Care | Payer: Self-pay | Source: Ambulatory Visit | Attending: Internal Medicine | Admitting: Internal Medicine

## 2016-01-16 ENCOUNTER — Ambulatory Visit: Payer: Self-pay

## 2016-01-16 DIAGNOSIS — B182 Chronic viral hepatitis C: Secondary | ICD-10-CM

## 2016-01-16 DIAGNOSIS — Z23 Encounter for immunization: Secondary | ICD-10-CM

## 2016-01-16 DIAGNOSIS — R932 Abnormal findings on diagnostic imaging of liver and biliary tract: Secondary | ICD-10-CM | POA: Insufficient documentation

## 2016-01-16 DIAGNOSIS — K8020 Calculus of gallbladder without cholecystitis without obstruction: Secondary | ICD-10-CM | POA: Insufficient documentation

## 2016-01-16 DIAGNOSIS — N281 Cyst of kidney, acquired: Secondary | ICD-10-CM | POA: Insufficient documentation

## 2016-01-16 NOTE — Patient Instructions (Signed)
Cont Zepatier 1 tablet daily Come back in 2 weeks for lab Follow up wit Dr. Linus Salmons in August

## 2016-01-16 NOTE — Progress Notes (Signed)
Patient ID: Timothy Peters, male   DOB: 04-24-1951, 65 y.o.   MRN: [id number] HPI: Timothy Peters is a 65 y.o. male who is here for his hep C pharmacy appt.   No results found for: HCVGENOTYPE, HEPCGENOTYPE  Allergies: No Known Allergies  Vitals:    Past Medical History: Past Medical History  Diagnosis Date  . Diabetes mellitus without complication ([place])   . Hypertension   . Hyperlipidemia   . Erectile dysfunction   . Gallstone 06/16    on CT, also present on U/S in Jan 2017    Social History: Social History   Social History  . Marital Status: Married    Spouse Name: N/A  . Number of Children: N/A  . Years of Education: N/A   Social History Main Topics  . Smoking status: Former Smoker    Quit date: 11/30/2007  . Smokeless tobacco: Not on file  . Alcohol Use: No     Comment: Quit x 13 yrs.  . Drug Use: No  . Sexual Activity: Not on file   Other Topics Concern  . Not on file   Social History Narrative    Labs: HEP B S AB (no units)  Date Value  12/04/2015 NEG   HEPATITIS B SURFACE AG (no units)  Date Value  12/04/2015 NEGATIVE    No results found for: HCVGENOTYPE, HEPCGENOTYPE  No flowsheet data found.  AST  Date Value  11/28/2015 30 IU/L  04/06/2015 24 U/L  10/30/2011 28 U/L   ALT  Date Value  11/28/2015 28 IU/L  04/06/2015 25 U/L  10/30/2011 19 U/L   INR (no units)  Date Value  12/04/2015 0.98    CrCl: CrCl cannot be calculated (Unknown ideal weight.).  Fibrosis Score: F2/3 ARFI  Child-Pugh Score: Class A  Previous Treatment Regimen: Naive  Assessment: Timothy Peters started on his Zepatier on 5/15. 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 He was very appreciative of the meds for his hep C. He has not missed any doses. He is on Zocor for his cholesterol but the current dose it ok on Zepatier. He has not had any side effect issues with the drug. His ARFI was done this AM before the appt and it came back as F2/3. 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 He is also surface antigen neg so he is not chronically infected.  We are going to start the vaccine series today.   Recommendations:  Cont zepatier 1 PO qday x 27mo F/u with lab in 2 wks F/u Dr. Linus Salmons in August Hep B today  Onnie Boer Rutledge, [place].D., BCPS, AAHIVP Clinical Infectious [place] 01/16/2016, 11:06 AM

## 2016-01-24 ENCOUNTER — Telehealth: Payer: Self-pay | Admitting: Dietician

## 2016-01-24 ENCOUNTER — Ambulatory Visit (INDEPENDENT_AMBULATORY_CARE_PROVIDER_SITE_OTHER): Payer: Self-pay | Admitting: Internal Medicine

## 2016-01-24 ENCOUNTER — Encounter: Payer: Self-pay | Admitting: Internal Medicine

## 2016-01-24 VITALS — BP 126/75 | HR 74 | Temp 98.2°F | Ht 71.0 in | Wt 223.4 lb

## 2016-01-24 DIAGNOSIS — Z0000 Encounter for general adult medical examination without abnormal findings: Secondary | ICD-10-CM

## 2016-01-24 DIAGNOSIS — E1165 Type 2 diabetes mellitus with hyperglycemia: Secondary | ICD-10-CM

## 2016-01-24 DIAGNOSIS — I10 Essential (primary) hypertension: Secondary | ICD-10-CM

## 2016-01-24 DIAGNOSIS — Z794 Long term (current) use of insulin: Secondary | ICD-10-CM

## 2016-01-24 DIAGNOSIS — E1140 Type 2 diabetes mellitus with diabetic neuropathy, unspecified: Secondary | ICD-10-CM

## 2016-01-24 DIAGNOSIS — R1011 Right upper quadrant pain: Secondary | ICD-10-CM

## 2016-01-24 DIAGNOSIS — B182 Chronic viral hepatitis C: Secondary | ICD-10-CM

## 2016-01-24 DIAGNOSIS — R1012 Left upper quadrant pain: Secondary | ICD-10-CM

## 2016-01-24 DIAGNOSIS — IMO0002 Reserved for concepts with insufficient information to code with codable children: Secondary | ICD-10-CM

## 2016-01-24 MED ORDER — INSULIN GLARGINE 100 UNIT/ML ~~LOC~~ SOLN: 80.0000 [IU] | Freq: Every day | SUBCUTANEOUS | Status: DC

## 2016-01-24 NOTE — Progress Notes (Signed)
Subjective:   Patient ID: Timothy Peters male   DOB: April 04, 1951 65 y.o.   MRN: [id number]  HPI: Timothy Peters is a 65 y.o. male with chronic hepatitis C, history of substance abuse, poorly controlled type 2 diabetes complicated by neuropathy, hypertension who presents today for follow up of his HTN, DM and bilateral rib pain. Please see assessment & plan for status of chronic medical problems.  Past Medical History  Diagnosis Date  . Diabetes mellitus without complication ([place])   . Hypertension   . Hyperlipidemia   . Erectile dysfunction   . Gallstone 06/16    on CT, also present on U/S in Jan 2017   Current Outpatient Prescriptions  Medication Sig Dispense Refill  . aspirin 81 MG tablet Take 81 mg by mouth daily.    . Elbasvir-Grazoprevir (ZEPATIER) 50-100 MG TABS Take 1 tablet by mouth daily. 28 tablet 2  . gabapentin (NEURONTIN) 300 MG capsule Take 300 mg by mouth 2 (two) times daily.    Marland Kitchen glipiZIDE (GLUCOTROL) 5 MG tablet Take 1 tablet daily with either lunch or dinner. If blood sugar less than 150, hold your dose. 180 tablet 3  . glucose blood test strip Use as instructed 100 each 12  . insulin glargine (LANTUS) 100 UNIT/ML injection Inject 0.8 mLs (80 Units total) into the skin daily. 10 mL 3  . Insulin Pen Needle 31G X 5 MM MISC 1 Units by Does not apply route as directed. 100 each 11  . lisinopril (PRINIVIL,ZESTRIL) 10 MG tablet Take 1 tablet (10 mg total) by mouth daily. 30 tablet 11  . metFORMIN (GLUCOPHAGE) 1000 MG tablet Take 1 tablet (1,000 mg total) by mouth 2 (two) times daily with a meal. Take 1 tab daily 180 tablet 3  . metoprolol tartrate (LOPRESSOR) 25 MG tablet Take 0.5 tablets (12.5 mg total) by mouth 2 (two) times daily. 90 tablet 3  . Multiple Vitamins-Minerals (MULTIVITAMIN WITH MINERALS) tablet Take 1 tablet by mouth daily.    Marland Kitchen omega-3 acid ethyl esters (LOVAZA) 1 g capsule Take 2 capsules (2 g total) by mouth 2 (two) times daily. (Patient not taking:  Reported on 01/16/2016) 120 capsule 3  . sildenafil (VIAGRA) 100 MG tablet Take 100 mg by mouth daily as needed for erectile dysfunction.    . simvastatin (ZOCOR) 20 MG tablet Take 1 tablet (20 mg total) by mouth daily. 90 tablet 3   No current facility-administered medications for this visit.   Family History  Problem Relation Age of Onset  . Hypertension Mother   . Stroke Mother   . Heart attack Father   . Cancer Sister   . Cancer Brother   . Diabetes Other   . Hypertension Other   . Hyperlipidemia Other   . Heart attack Maternal Grandmother   . Hypertension Maternal Grandfather   . Stroke Paternal Grandmother   . Stroke Paternal Grandfather    Social History   Social History  . Marital Status: Married    Spouse Name: N/A  . Number of Children: N/A  . Years of Education: N/A   Social History Main Topics  . Smoking status: Former Smoker    Quit date: 11/30/2007  . Smokeless tobacco: None  . Alcohol Use: No     Comment: Quit x 13 yrs.  . Drug Use: No  . Sexual Activity: Not Asked   Other Topics Concern  . None   Social History Narrative   Review of Systems: Review of Systems  Eyes: Negative for blurred vision and double vision.  Respiratory: Negative for cough.   Cardiovascular: Negative for chest pain and palpitations.  Gastrointestinal: Positive for abdominal pain.  Skin: Negative for itching and rash.  Neurological: Negative for dizziness and headaches.    Objective:  Physical Exam: Filed Vitals:   01/24/16 1332  BP: 126/75  Pulse: 74  Temp: 98.2 F (36.8 C)  TempSrc: Oral  Height: 5\' 11"  (1.803 m)  Weight: 223 lb 6.4 oz (101.334 kg)  SpO2: 100%    Physical Exam  Constitutional: He is oriented to person, place, and time. He appears well-developed and well-nourished.  Cardiovascular: Normal rate and regular rhythm.   Pulmonary/Chest: Effort normal. No stridor. No respiratory distress.  Abdominal: Soft. Bowel sounds are normal. He exhibits no  distension and no mass. There is mild tenderness to palpation in bilateral upper quadrants. There is no rebound and no guarding.  Obese abdomen  Neurological: He is alert and oriented to person, place, and time.  Skin: Skin is warm and dry. No rash noted.   Assessment & Plan:  Case discussed with Dr. Evette Doffing. Please refer to Problem based charting for further details of today's visit.

## 2016-01-24 NOTE — Telephone Encounter (Signed)
[place] health department. They will get a meter ready for Mr. Ostin Seebeck.

## 2016-01-24 NOTE — Patient Instructions (Signed)
Thank you for coming in today, it was pleasure to meet you.  If your pain gets worse, please call the clinic and schedule an appointment for the steroid injection. Let them know you had an injection before and it helped with the pain so they know who to schedule you with. You can use Ibuprofen as needed at home.  Please get a new meter and check your sugars twice a day, once in the morning and another time during the day. Please bring your meter with you to your next appointment.  I would like to see you back in 2 months for follow up.

## 2016-01-26 NOTE — Assessment & Plan Note (Signed)
Reports having colonoscopy at [facility] in the past year. Will attempt to get records.

## 2016-01-26 NOTE — Assessment & Plan Note (Addendum)
Recently seen by Dr. Linus Salmons of Infectious Disease. HCV genotype 1a.  Started on Elbasivr-Granzoprevir on 12/20/15.  Reports no side effects from the medications. Reports compliance.   Follow up with ID.

## 2016-01-26 NOTE — Assessment & Plan Note (Signed)
Lab Results  Component Value Date   HGBA1C 8.6 12/22/2015   HGBA1C 8.3 09/21/2015   HGBA1C 12.1* 04/06/2015     Assessment: Patient reports that he has had continued problems with his glucometer. Reports putting it in rice after dropping it in water and it functioning afterwards. However, it has been cutting on and off since that time and he has not been able to reliably use the machine. Says he will be able to get a replacement for $6 today. He reports that the few times he has been able to check his sugars they have been in the 180-200 range at home. Denies any episodes of hypoglycemia.   He is currently on Metfromin 1000 mg bid, Glipizide 5 mg daily and Lantus 80 units qam. He reports that the coupon he was given for the Toujeo in February was not valid and he could not afford it. He was able to enroll in the [employer] program and is back on his prior dose of Lantus 80 units. He reports taking this every morning before breakfast.   A1c on 5/5 was 8.6. Goal < 7%.  Does report getting his DM eye exam at last visit.   Plan: Will continue current regimen today. Encouraged to replace his meter and check his sugars at least twice a day at home and bring his meter with him to his next visit.  Follow up in 2 months for re-check.

## 2016-01-26 NOTE — Progress Notes (Signed)
Internal Medicine Clinic Attending  Case discussed with Dr. Boswell at the time of the visit.  We reviewed the resident's history and exam and pertinent patient test results.  I agree with the assessment, diagnosis, and plan of care documented in the resident's note.  

## 2016-01-26 NOTE — Assessment & Plan Note (Signed)
He reports his right sided pain is 2/10 today and has been stable since getting the injection on 5/12. He does note that his right sided pain was improving prior to the injection and he is not sure if the injection helped or not. Does note that his pain has not gotten any worse since that time.  His left sided pain worsened last week and he reports he almost made an appointment last week to be seen. However, over the last 2-3 days the pain has improved and is 4/10 today. He does not wish to pursue any further injections today and would prefer to watch it.  He describes the pain as burning underneath his rib cage (bilaterally) with intermittent episodes of pain. The pain is worse with rest and relieved with movement/exercise. Reports improvement when putting pressure on the area with the flares of pain. Does report relief with the horse liniment.   Assessment Unclear source of his pain. His response to the steroid injection and the horse liniment would suggest MSK in nature however it is worst at rest and relieved with movement. Pain appears to be improving and he has had an extensive work up already at this point. Do not feel that any further work up is warranted at this time.   Plan Continue using the horse liniment as needed If the pain worsens consider additional trigger point injections

## 2016-01-26 NOTE — Assessment & Plan Note (Signed)
BP Readings from Last 3 Encounters:  01/24/16 126/75  12/29/15 132/66  12/22/15 135/67    Lab Results  Component Value Date   NA 141 11/28/2015   K 4.5 11/28/2015   CREATININE 0.74* 11/28/2015    Assessment: Patient currently on metoprolol tartrate 25 mg bid and enalapril 10 mg daily. Reports compliance with his medications. BP is well controlled today at 126/75. Recent labs from 11/28/15 show no electrolyte abnormalities or renal dysfunction.  Plan: Continue current medications.

## 2016-02-02 ENCOUNTER — Other Ambulatory Visit: Payer: Self-pay

## 2016-02-02 DIAGNOSIS — B182 Chronic viral hepatitis C: Secondary | ICD-10-CM

## 2016-02-05 LAB — HEPATITIS C RNA QUANTITATIVE: HCV Quantitative: NOT DETECTED IU/mL (ref <15)

## 2016-02-14 ENCOUNTER — Other Ambulatory Visit: Payer: Self-pay | Admitting: Internal Medicine

## 2016-02-14 MED ORDER — OLMESARTAN MEDOXOMIL 20 MG PO TABS: 20.0000 mg | ORAL_TABLET | Freq: Every day | ORAL | Status: DC

## 2016-02-14 NOTE — Progress Notes (Signed)
Received letter from [employer].  They are not able to cover Lisinopril but can cover Benicar.  Will give Rx for Benicar and d/c lisinopril for cost considerations.   Also note that they can cover Januiva for free. Will discuss at next visit.

## 2016-02-15 ENCOUNTER — Ambulatory Visit (INDEPENDENT_AMBULATORY_CARE_PROVIDER_SITE_OTHER): Payer: Self-pay | Admitting: *Deleted

## 2016-02-15 DIAGNOSIS — Z23 Encounter for immunization: Secondary | ICD-10-CM

## 2016-02-15 DIAGNOSIS — B182 Chronic viral hepatitis C: Secondary | ICD-10-CM

## 2016-02-21 ENCOUNTER — Encounter: Payer: Self-pay | Admitting: Internal Medicine

## 2016-03-19 ENCOUNTER — Encounter: Payer: Self-pay | Admitting: Internal Medicine

## 2016-03-19 ENCOUNTER — Ambulatory Visit (INDEPENDENT_AMBULATORY_CARE_PROVIDER_SITE_OTHER): Payer: Self-pay | Admitting: Internal Medicine

## 2016-03-19 VITALS — BP 146/82 | HR 54 | Temp 97.9°F | Ht 71.0 in | Wt 225.0 lb

## 2016-03-19 DIAGNOSIS — F1110 Opioid abuse, uncomplicated: Secondary | ICD-10-CM

## 2016-03-19 DIAGNOSIS — F1111 Opioid abuse, in remission: Secondary | ICD-10-CM

## 2016-03-19 DIAGNOSIS — K7400 Hepatic fibrosis, unspecified: Secondary | ICD-10-CM

## 2016-03-19 DIAGNOSIS — B182 Chronic viral hepatitis C: Secondary | ICD-10-CM

## 2016-03-19 NOTE — Assessment & Plan Note (Signed)
No active drug or alcohol use.

## 2016-03-19 NOTE — Assessment & Plan Note (Signed)
Now about finished.  Labs in 2 weeks and again in 4 months.  rtc after 2nd labs.

## 2016-03-19 NOTE — Assessment & Plan Note (Signed)
Will recheck elastography next year.

## 2016-03-19 NOTE — Progress Notes (Signed)
   Subjective:    Patient ID: Timothy Peters, male    DOB: 1951-06-07, 65 y.o.   MRN: [id number]  HPI Here for follow up of HCV.   Has genotype 1a, viral load positive, no previous treatment started Zepatier about 10 weeks ago.  Has less than 2 weeks left.  No issues.  Has not missed any doses. F2/3 on elastography.     Review of Systems  Constitutional: Positive for fatigue.       Chronic and unchanged  Skin: Negative for rash.  Neurological: Negative for dizziness and headaches.       Objective:   Physical Exam  Constitutional: He appears well-developed and well-nourished. No distress.  Eyes: No scleral icterus.  Cardiovascular: Normal rate, regular rhythm and normal heart sounds.   No murmur heard. Skin: No rash noted.          Assessment & Plan:

## 2016-03-26 ENCOUNTER — Other Ambulatory Visit: Payer: Self-pay | Admitting: Internal Medicine

## 2016-03-26 ENCOUNTER — Other Ambulatory Visit: Payer: Self-pay | Admitting: *Deleted

## 2016-03-26 MED ORDER — OLMESARTAN MEDOXOMIL 20 MG PO TABS: 20.0000 mg | ORAL_TABLET | Freq: Every day | ORAL | 2 refills | Status: DC

## 2016-03-26 NOTE — Telephone Encounter (Signed)
olmesartan (BENICAR) 20 MG tablet [place]

## 2016-03-28 NOTE — Telephone Encounter (Signed)
Refill done in another enc

## 2016-04-01 ENCOUNTER — Ambulatory Visit: Payer: Self-pay

## 2016-04-02 ENCOUNTER — Other Ambulatory Visit: Payer: Self-pay

## 2016-04-02 DIAGNOSIS — B182 Chronic viral hepatitis C: Secondary | ICD-10-CM

## 2016-04-04 LAB — HEPATITIS C RNA QUANTITATIVE: HCV Quantitative: NOT DETECTED IU/mL (ref <15)

## 2016-04-08 NOTE — Telephone Encounter (Signed)
Called to pharmacy 

## 2016-04-19 ENCOUNTER — Other Ambulatory Visit: Payer: Self-pay

## 2016-04-19 NOTE — Patient Outreach (Signed)
Link to [employer] closure due to patient failure to engage in program.     Deanne Coffer, PharmD, [place] [phone]

## 2016-07-16 ENCOUNTER — Other Ambulatory Visit: Payer: Self-pay | Admitting: *Deleted

## 2016-07-17 MED ORDER — OMEGA-3-ACID ETHYL ESTERS 1 G PO CAPS: 2.0000 g | ORAL_CAPSULE | Freq: Two times a day (BID) | ORAL | 3 refills | Status: DC

## 2016-07-18 ENCOUNTER — Other Ambulatory Visit: Payer: Self-pay

## 2016-08-01 ENCOUNTER — Ambulatory Visit (INDEPENDENT_AMBULATORY_CARE_PROVIDER_SITE_OTHER): Payer: No Typology Code available for payment source | Admitting: Internal Medicine

## 2016-08-01 ENCOUNTER — Encounter: Payer: Self-pay | Admitting: Internal Medicine

## 2016-08-01 VITALS — BP 138/77 | HR 65 | Temp 97.5°F | Ht 71.0 in | Wt 231.0 lb

## 2016-08-01 DIAGNOSIS — K7400 Hepatic fibrosis, unspecified: Secondary | ICD-10-CM

## 2016-08-01 DIAGNOSIS — B182 Chronic viral hepatitis C: Secondary | ICD-10-CM

## 2016-08-01 DIAGNOSIS — Z23 Encounter for immunization: Secondary | ICD-10-CM

## 2016-08-01 NOTE — Assessment & Plan Note (Signed)
Lab today and if negative, considered cured.

## 2016-08-01 NOTE — Progress Notes (Signed)
   Subjective:    Patient ID: Timothy Peters, male    DOB: Oct 20, 1950, 65 y.o.   MRN: [id number]  HPI Here for follow up of HCV.   Has genotype 1a, viral load positive, no previous treatment started Zepatier and completed in August, 4 months ago. No issues.  Did not miss any doses. F2/3 on elastography.     Review of Systems  Constitutional: Negative for fatigue.  Skin: Negative for rash.  Neurological: Negative for dizziness and headaches.       Objective:   Physical Exam  Constitutional: He appears well-developed and well-nourished. No distress.  Eyes: No scleral icterus.  Cardiovascular: Normal rate, regular rhythm and normal heart sounds.   No murmur heard. Skin: No rash noted.          Assessment & Plan:

## 2016-08-01 NOTE — Assessment & Plan Note (Signed)
F2/3, no cirrhosis.  No follow up indicated.

## 2016-08-05 LAB — HEPATITIS C RNA QUANTITATIVE: HCV Quantitative: NOT DETECTED IU/mL (ref <15)

## 2016-09-18 ENCOUNTER — Other Ambulatory Visit: Payer: Self-pay | Admitting: *Deleted

## 2016-09-18 MED ORDER — OMEGA-3-ACID ETHYL ESTERS 1 G PO CAPS: 2.0000 g | ORAL_CAPSULE | Freq: Two times a day (BID) | ORAL | 3 refills | Status: DC

## 2016-09-19 NOTE — Telephone Encounter (Signed)
rx phoned into pharmacy.[name], [name]2/1/20182:29 PM

## 2016-09-26 ENCOUNTER — Other Ambulatory Visit: Payer: Self-pay

## 2016-09-26 MED ORDER — SIMVASTATIN 20 MG PO TABS: 20.0000 mg | ORAL_TABLET | Freq: Every day | ORAL | 0 refills | Status: DC

## 2016-09-26 MED ORDER — METOPROLOL TARTRATE 25 MG PO TABS: 12.5000 mg | ORAL_TABLET | Freq: Two times a day (BID) | ORAL | 0 refills | Status: DC

## 2016-09-26 NOTE — Telephone Encounter (Signed)
Refills needed

## 2016-09-26 NOTE — Telephone Encounter (Signed)
metoprolol tartrate (LOPRESSOR) 25 MG tablet, simvastatin (ZOCOR) 20 MG tablet, refill request @ health department.

## 2016-10-11 ENCOUNTER — Telehealth: Payer: Self-pay | Admitting: Internal Medicine

## 2016-10-11 NOTE — Telephone Encounter (Signed)
APT. REMINDER CALL, LMTCB °

## 2016-10-14 ENCOUNTER — Ambulatory Visit: Payer: No Typology Code available for payment source

## 2016-10-29 ENCOUNTER — Telehealth: Payer: Self-pay | Admitting: Internal Medicine

## 2016-10-29 NOTE — Telephone Encounter (Signed)
APT. REMINDER CALL, LMTCB °

## 2016-10-30 ENCOUNTER — Encounter: Payer: Self-pay | Admitting: Internal Medicine

## 2016-10-30 ENCOUNTER — Other Ambulatory Visit: Payer: Self-pay | Admitting: Pharmacist

## 2016-10-30 DIAGNOSIS — I10 Essential (primary) hypertension: Secondary | ICD-10-CM

## 2016-10-30 DIAGNOSIS — IMO0002 Reserved for concepts with insufficient information to code with codable children: Secondary | ICD-10-CM

## 2016-10-30 DIAGNOSIS — E1140 Type 2 diabetes mellitus with diabetic neuropathy, unspecified: Secondary | ICD-10-CM

## 2016-10-30 DIAGNOSIS — E1165 Type 2 diabetes mellitus with hyperglycemia: Secondary | ICD-10-CM

## 2016-10-30 MED ORDER — METOPROLOL TARTRATE 25 MG PO TABS: 12.5000 mg | ORAL_TABLET | Freq: Two times a day (BID) | ORAL | 0 refills | Status: DC

## 2016-10-30 MED ORDER — SIMVASTATIN 20 MG PO TABS: 20.0000 mg | ORAL_TABLET | Freq: Every day | ORAL | 0 refills | Status: DC

## 2016-10-30 MED ORDER — GLIPIZIDE 5 MG PO TABS: ORAL_TABLET | ORAL | 0 refills | Status: DC

## 2016-10-30 MED ORDER — METFORMIN HCL 1000 MG PO TABS: 1000.0000 mg | ORAL_TABLET | Freq: Two times a day (BID) | ORAL | 0 refills | Status: DC

## 2016-10-30 MED ORDER — LOSARTAN POTASSIUM 100 MG PO TABS: 100.0000 mg | ORAL_TABLET | Freq: Every day | ORAL | 0 refills | Status: DC

## 2016-10-30 MED FILL — glipiZIDE 5 MG TABS: 5 | 30 days supply | Qty: 60 | Fill #0

## 2016-10-30 MED FILL — SIMVASTATIN 20 MG TABLET: 20 | 30 days supply | Qty: 30 | Fill #0

## 2016-10-30 MED FILL — LOSARTAN POTASSIUM 100 MG T: 100 | 30 days supply | Qty: 30 | Fill #0

## 2016-10-30 MED FILL — metFORMIN HCL 1000 MG TABS: 1000 | 30 days supply | Qty: 60 | Fill #0

## 2016-10-30 MED FILL — METOPROLOL TARTRATE 25 MG T: 25 | 30 days supply | Qty: 30 | Fill #0

## 2016-10-30 NOTE — Progress Notes (Deleted)
   CC: ***  HPI:  Mr.Pratyush B Latner is a 66 y.o.   Past Medical History:  Diagnosis Date  . Diabetes mellitus without complication ([place])   . Erectile dysfunction   . Gallstone 06/16   on CT, also present on U/S in Jan 2017  . Hyperlipidemia   . Hypertension     Review of Systems:  ***  Physical Exam:  There were no vitals filed for this visit. ***  Assessment & Plan:   See Encounters Tab for problem based charting.  Patient {GC/[id number]::"discussed with","seen with"} Dr. {NAMES:[id number]::"Butcher","Granfortuna","E. Hoffman","Klima","Mullen","Narendra","Vincent"}

## 2016-10-30 NOTE — Progress Notes (Signed)
Patient needs help with 1-time medication fills, referred patient to [place]

## 2016-10-30 NOTE — Assessment & Plan Note (Signed)
BP Readings from Last 3 Encounters:  08/01/16 138/77  03/19/16 (!) 146/82  01/24/16 126/75    Lab Results  Component Value Date   NA 141 11/28/2015   K 4.5 11/28/2015   CREATININE 0.74 (L) 11/28/2015   Currently on metoprolol tartrate 25 mg bid and enalapril 10 mg daily.   Assessment:  Plan:

## 2016-10-30 NOTE — Assessment & Plan Note (Signed)
Lab Results  Component Value Date   HGBA1C 8.6 12/22/2015   HGBA1C 8.3 09/21/2015   HGBA1C 12.1 (H) 04/06/2015    Currently on Metfromin 1000 mg bid, Glipizide 5 mg daily and Lantus 80 units qam.   Assessment:   Plan:

## 2016-10-30 NOTE — Assessment & Plan Note (Signed)
Has completed course of Harvoni. Last labs showed no evidence of Hepatitis C. Considered cured per ID.

## 2016-11-13 ENCOUNTER — Encounter: Payer: Self-pay | Admitting: Cardiovascular Disease

## 2016-11-13 ENCOUNTER — Encounter (INDEPENDENT_AMBULATORY_CARE_PROVIDER_SITE_OTHER): Payer: BLUE CROSS/BLUE SHIELD | Admitting: Cardiovascular Disease

## 2016-11-13 NOTE — Progress Notes (Signed)
This encounter was created in error - please disregard.

## 2016-11-14 ENCOUNTER — Other Ambulatory Visit: Payer: Self-pay | Admitting: *Deleted

## 2016-11-14 MED ORDER — INSULIN GLARGINE 100 UNIT/ML ~~LOC~~ SOLN: 80.0000 [IU] | Freq: Every day | SUBCUTANEOUS | 3 refills | Status: DC

## 2016-11-18 ENCOUNTER — Telehealth: Payer: Self-pay | Admitting: Internal Medicine

## 2016-11-18 NOTE — Telephone Encounter (Signed)
error 

## 2016-11-18 NOTE — Progress Notes (Signed)
This encounter was created in error - please disregard.

## 2016-11-19 ENCOUNTER — Other Ambulatory Visit: Payer: Self-pay | Admitting: *Deleted

## 2016-11-19 NOTE — Telephone Encounter (Signed)
Lantus called to [facility] @ [facility]

## 2016-11-20 ENCOUNTER — Encounter (INDEPENDENT_AMBULATORY_CARE_PROVIDER_SITE_OTHER): Payer: BLUE CROSS/BLUE SHIELD | Admitting: Internal Medicine

## 2016-11-20 ENCOUNTER — Other Ambulatory Visit: Payer: Self-pay | Admitting: Internal Medicine

## 2016-11-20 DIAGNOSIS — Z794 Long term (current) use of insulin: Secondary | ICD-10-CM

## 2016-11-20 DIAGNOSIS — E118 Type 2 diabetes mellitus with unspecified complications: Secondary | ICD-10-CM

## 2016-11-20 NOTE — Progress Notes (Signed)
Patient presented to [facility], [place]. Refused to be seen by another provider. Unable to see patient this afternoon and will re-schedule for 4/18. Will get basic labs today.

## 2016-11-21 ENCOUNTER — Ambulatory Visit: Payer: Self-pay

## 2016-11-21 LAB — BMP8+ANION GAP
Anion Gap: 18 mmol/L (ref 10.0–18.0)
BUN/Creatinine Ratio: 15 (ref 10–24)
BUN: 14 mg/dL (ref 8–27)
CO2: 21 mmol/L (ref 18–29)
Calcium: 9.1 mg/dL (ref 8.6–10.2)
Chloride: 99 mmol/L (ref 96–106)
Creatinine, Ser: 0.95 mg/dL (ref 0.76–1.27)
GFR calc Af Amer: 97 mL/min/{1.73_m2} (ref >59)
GFR calc non Af Amer: 84 mL/min/{1.73_m2} (ref >59)
Glucose: 231 mg/dL — ABNORMAL HIGH (ref 65–99)
Potassium: 4.1 mmol/L (ref 3.5–5.2)
Sodium: 138 mmol/L (ref 134–144)

## 2016-11-21 LAB — HEMOGLOBIN A1C
Est. average glucose Bld gHb Est-mCnc: 237 mg/dL
Hgb A1c MFr Bld: 9.9 % — ABNORMAL HIGH (ref 4.8–5.6)

## 2016-11-25 ENCOUNTER — Telehealth: Payer: Self-pay | Admitting: *Deleted

## 2016-11-25 NOTE — Telephone Encounter (Signed)
Dawn from [facility] called and states [facility] pharm rec'd 2 scripts the same day for lantus- one from dr boswell and 1 from dr polite at [facility]. Spoke w/ [place]. And she stated pt goes to dr polite when he has insurance and comes to [facility] when he doesn't. Dawn will call pt and verify what md he is seeing at present. Sending to doriss. And dr boswell.

## 2016-11-26 ENCOUNTER — Other Ambulatory Visit: Payer: Self-pay

## 2016-11-26 ENCOUNTER — Other Ambulatory Visit: Payer: Self-pay | Admitting: *Deleted

## 2016-11-26 NOTE — Telephone Encounter (Signed)
insulin glargine (LANTUS) 100 UNIT/ML injection, Insulin Pen Needle 31G X 5 MM MISC, refill request @ [facility]

## 2016-11-26 NOTE — Telephone Encounter (Signed)
Pharmacy called requesting to change Lantus vials to Lantus pens please advise

## 2016-11-27 MED ORDER — INSULIN GLARGINE 100 UNIT/ML SOLOSTAR PEN: 80.0000 [IU] | PEN_INJECTOR | Freq: Every morning | SUBCUTANEOUS | 2 refills | Status: DC

## 2016-11-27 MED ORDER — INSULIN PEN NEEDLE 31G X 5 MM MISC: 1.0000 [IU] | 11 refills | Status: DC

## 2016-11-27 NOTE — Telephone Encounter (Signed)
Patient called states he could not get insulin refill because he has 2 prescription for the insulin and [facility] pharmacy will not fill it until they speak with PCP office. Called pharmacy they report issue is resolved will call patient when medication is ready for pick up

## 2016-11-28 NOTE — Progress Notes (Signed)
This encounter was created in error - please disregard.

## 2016-12-03 DIAGNOSIS — E663 Overweight: Secondary | ICD-10-CM | POA: Diagnosis not present

## 2016-12-03 DIAGNOSIS — E782 Mixed hyperlipidemia: Secondary | ICD-10-CM | POA: Diagnosis not present

## 2016-12-03 DIAGNOSIS — I10 Essential (primary) hypertension: Secondary | ICD-10-CM | POA: Diagnosis not present

## 2016-12-03 DIAGNOSIS — E119 Type 2 diabetes mellitus without complications: Secondary | ICD-10-CM | POA: Diagnosis not present

## 2016-12-04 ENCOUNTER — Other Ambulatory Visit: Payer: Self-pay | Admitting: Pharmacist

## 2016-12-04 ENCOUNTER — Encounter: Payer: BLUE CROSS/BLUE SHIELD | Admitting: Internal Medicine

## 2016-12-04 DIAGNOSIS — I10 Essential (primary) hypertension: Secondary | ICD-10-CM

## 2016-12-04 DIAGNOSIS — IMO0002 Reserved for concepts with insufficient information to code with codable children: Secondary | ICD-10-CM

## 2016-12-04 DIAGNOSIS — E1140 Type 2 diabetes mellitus with diabetic neuropathy, unspecified: Secondary | ICD-10-CM

## 2016-12-04 DIAGNOSIS — E1165 Type 2 diabetes mellitus with hyperglycemia: Secondary | ICD-10-CM

## 2016-12-04 MED ORDER — METOPROLOL TARTRATE 25 MG PO TABS: 12.5000 mg | ORAL_TABLET | Freq: Two times a day (BID) | ORAL | 1 refills | Status: DC

## 2016-12-04 MED ORDER — LOSARTAN POTASSIUM 100 MG PO TABS: 100.0000 mg | ORAL_TABLET | Freq: Every day | ORAL | 1 refills | Status: DC

## 2016-12-04 MED ORDER — GLIPIZIDE 5 MG PO TABS: ORAL_TABLET | ORAL | 1 refills | Status: DC

## 2016-12-04 MED ORDER — INSULIN GLARGINE 100 UNIT/ML SOLOSTAR PEN: 80.0000 [IU] | PEN_INJECTOR | Freq: Every morning | SUBCUTANEOUS | 1 refills | Status: DC

## 2016-12-04 MED ORDER — INSULIN PEN NEEDLE 31G X 5 MM MISC: 1.0000 [IU] | 11 refills | Status: DC

## 2016-12-04 MED ORDER — SIMVASTATIN 20 MG PO TABS: 20.0000 mg | ORAL_TABLET | Freq: Every day | ORAL | 1 refills | Status: DC

## 2016-12-04 MED ORDER — METFORMIN HCL 1000 MG PO TABS: 1000.0000 mg | ORAL_TABLET | Freq: Two times a day (BID) | ORAL | 1 refills | Status: DC

## 2016-12-04 MED ORDER — INSULIN GLARGINE 100 UNIT/ML SOLOSTAR PEN: 80.0000 [IU] | PEN_INJECTOR | Freq: Every morning | SUBCUTANEOUS | 2 refills | Status: DC

## 2016-12-04 MED FILL — METOPROLOL TARTRATE 25 MG T: 25 | 30 days supply | Qty: 30 | Fill #0

## 2016-12-04 MED FILL — UNIFINE PENTIPS 31GX3/16": 31G X 5 MM | 90 days supply | Qty: 100 | Fill #0

## 2016-12-04 MED FILL — metFORMIN HCL 1000 MG TABS: 1000 | 30 days supply | Qty: 60 | Fill #0

## 2016-12-04 MED FILL — LOSARTAN POTASSIUM 100 MG T: 100 | 30 days supply | Qty: 30 | Fill #0

## 2016-12-04 MED FILL — SIMVASTATIN 20 MG TABLET: 20 | 30 days supply | Qty: 30 | Fill #0

## 2016-12-04 MED FILL — LANTUS SOLOSTAR 100 UNITS/M: 100 | 19 days supply | Qty: 15 | Fill #0

## 2016-12-04 MED FILL — UNIFINE PENTIPS 31GX3/16: 31G X 5 MM | 90 days supply | Qty: 100 | Fill #0

## 2016-12-04 MED FILL — glipiZIDE 5 MG TABS: 5 | 30 days supply | Qty: 30 | Fill #0

## 2016-12-04 NOTE — Telephone Encounter (Signed)
Hi Dr. Lynnae January, I was able to clarify patient can get from [facility] and he states he has a new insurance. I advised him to contact us if further help is needed.   Thank you

## 2016-12-04 NOTE — Addendum Note (Signed)
Addended by: Forde Dandy on: 12/04/2016 05:39 PM   Modules accepted: Orders

## 2016-12-04 NOTE — Progress Notes (Signed)
Refilled medications to cover patient until appointment scheduled for May 2018 per PCP. Patient now has BCBS coverage. Advised patient to contact me if further concern.

## 2016-12-04 NOTE — Addendum Note (Signed)
Addended by: Larey Dresser A on: 12/04/2016 04:09 PM   Modules accepted: Orders

## 2016-12-04 NOTE — Telephone Encounter (Addendum)
Dr Maudie Mercury - may I send to [facility] - that is listed as his pharmacy.  Butch Penny - would you pls order pen needles?  Does pt know how to use pen?  Pt must keep May appt to [facility] to receive meds / be an [facility] pt

## 2016-12-05 NOTE — Telephone Encounter (Signed)
No problem, I cancelled the one at health dept as patient states he now has [place]. Thank you

## 2016-12-05 NOTE — Addendum Note (Signed)
Addended by: Forde Dandy on: 12/05/2016 10:36 AM   Modules accepted: Orders

## 2016-12-05 NOTE — Telephone Encounter (Signed)
Looks like two scripts were sent in - one for 15 ml and other for 49ml. I had calc he needed 30 ml for one month and gave him one refill to get him to next appt. Would you pls clarify need and cancel one script? Thanks

## 2017-01-08 ENCOUNTER — Encounter: Payer: BLUE CROSS/BLUE SHIELD | Admitting: Internal Medicine

## 2017-01-20 MED FILL — metFORMIN HCL 1000 MG TABS: 1000 | 30 days supply | Qty: 60 | Fill #1

## 2017-01-20 MED FILL — METOPROLOL TARTRATE 25 MG T: 25 | 30 days supply | Qty: 30 | Fill #1

## 2017-01-20 MED FILL — glipiZIDE 5 MG TABS: 5 | 30 days supply | Qty: 30 | Fill #1

## 2017-01-20 MED FILL — LOSARTAN POTASSIUM 100 MG T: 100 | 30 days supply | Qty: 30 | Fill #1

## 2017-01-20 MED FILL — SIMVASTATIN 20 MG TABLET: 20 | 30 days supply | Qty: 30 | Fill #1

## 2017-01-20 MED FILL — LANTUS SOLOSTAR 100 UNITS/M: 100 | 30 days supply | Qty: 24 | Fill #0

## 2017-02-21 ENCOUNTER — Other Ambulatory Visit: Payer: Self-pay | Admitting: Internal Medicine

## 2017-02-21 DIAGNOSIS — I10 Essential (primary) hypertension: Secondary | ICD-10-CM

## 2017-02-21 DIAGNOSIS — E1165 Type 2 diabetes mellitus with hyperglycemia: Principal | ICD-10-CM

## 2017-02-21 DIAGNOSIS — IMO0002 Reserved for concepts with insufficient information to code with codable children: Secondary | ICD-10-CM

## 2017-02-21 DIAGNOSIS — E1140 Type 2 diabetes mellitus with diabetic neuropathy, unspecified: Secondary | ICD-10-CM

## 2017-02-21 MED FILL — LANTUS SOLOSTAR 100 UNITS/M: 100 | 30 days supply | Qty: 24 | Fill #1

## 2017-02-21 MED FILL — glipiZIDE 5 MG TABS: 5 | 30 days supply | Qty: 30 | Fill #2

## 2017-02-21 NOTE — Telephone Encounter (Signed)
Refill refused as patient has transferred his care to DrPolite.Despina Hidden Cassady7/6/201812:10 PM

## 2017-02-24 MED FILL — SIMVASTATIN 20 MG TABLET: 20 | 90 days supply | Qty: 90 | Fill #0

## 2017-02-24 MED FILL — metFORMIN HCL 1000 MG TABS: 1000 | 90 days supply | Qty: 180 | Fill #0

## 2017-02-24 MED FILL — OMEGA-3 ETHYL ESTERS 1 GM C: 1 | 90 days supply | Qty: 180 | Fill #0

## 2017-02-24 MED FILL — METOPROLOL SUCC ER 25 MG TA: 25 | 90 days supply | Qty: 90 | Fill #0

## 2017-02-24 MED FILL — LOSARTAN POTASSIUM 100 MG T: 100 | 90 days supply | Qty: 90 | Fill #0

## 2017-03-04 DIAGNOSIS — E7800 Pure hypercholesterolemia, unspecified: Secondary | ICD-10-CM | POA: Diagnosis not present

## 2017-03-04 DIAGNOSIS — E119 Type 2 diabetes mellitus without complications: Secondary | ICD-10-CM | POA: Diagnosis not present

## 2017-03-04 DIAGNOSIS — I10 Essential (primary) hypertension: Secondary | ICD-10-CM | POA: Diagnosis not present

## 2017-03-04 DIAGNOSIS — E663 Overweight: Secondary | ICD-10-CM | POA: Diagnosis not present

## 2017-03-04 MED FILL — tiZANidine HCL 4 MG TABS: 4 | 10 days supply | Qty: 30 | Fill #0

## 2017-03-13 MED FILL — LANTUS SOLOSTAR 100 UNITS/M: 100 | 30 days supply | Qty: 30 | Fill #0

## 2017-03-25 DIAGNOSIS — Y990 Civilian activity done for income or pay: Secondary | ICD-10-CM | POA: Diagnosis not present

## 2017-03-25 DIAGNOSIS — S46009A Unspecified injury of muscle(s) and tendon(s) of the rotator cuff of unspecified shoulder, initial encounter: Secondary | ICD-10-CM | POA: Diagnosis not present

## 2017-03-25 DIAGNOSIS — Z87891 Personal history of nicotine dependence: Secondary | ICD-10-CM | POA: Diagnosis not present

## 2017-03-25 DIAGNOSIS — I10 Essential (primary) hypertension: Secondary | ICD-10-CM | POA: Diagnosis not present

## 2017-03-25 DIAGNOSIS — Y9389 Activity, other specified: Secondary | ICD-10-CM | POA: Insufficient documentation

## 2017-03-25 DIAGNOSIS — S4992XA Unspecified injury of left shoulder and upper arm, initial encounter: Secondary | ICD-10-CM | POA: Diagnosis present

## 2017-03-25 DIAGNOSIS — Z794 Long term (current) use of insulin: Secondary | ICD-10-CM | POA: Diagnosis not present

## 2017-03-25 DIAGNOSIS — X500XXA Overexertion from strenuous movement or load, initial encounter: Secondary | ICD-10-CM | POA: Insufficient documentation

## 2017-03-25 DIAGNOSIS — E119 Type 2 diabetes mellitus without complications: Secondary | ICD-10-CM | POA: Insufficient documentation

## 2017-03-25 DIAGNOSIS — Y929 Unspecified place or not applicable: Secondary | ICD-10-CM | POA: Diagnosis not present

## 2017-03-25 DIAGNOSIS — Z79899 Other long term (current) drug therapy: Secondary | ICD-10-CM | POA: Insufficient documentation

## 2017-03-26 ENCOUNTER — Encounter (HOSPITAL_COMMUNITY): Payer: Self-pay | Admitting: Family Medicine

## 2017-03-26 ENCOUNTER — Emergency Department (HOSPITAL_COMMUNITY)
Admission: EM | Admit: 2017-03-26 | Discharge: 2017-03-26 | Disposition: A | Discharge status: Home / Self Care | Payer: Worker's Compensation | Attending: Emergency Medicine | Admitting: Emergency Medicine

## 2017-03-26 DIAGNOSIS — S46009A Unspecified injury of muscle(s) and tendon(s) of the rotator cuff of unspecified shoulder, initial encounter: Secondary | ICD-10-CM

## 2017-03-26 DIAGNOSIS — M25512 Pain in left shoulder: Secondary | ICD-10-CM

## 2017-03-26 MED ORDER — OXYCODONE-ACETAMINOPHEN 5-325 MG PO TABS: 2.0000 | ORAL_TABLET | Freq: Once | ORAL | Status: DC

## 2017-03-26 MED ORDER — LIDOCAINE 5 % EX PTCH
1.0000 | MEDICATED_PATCH | CUTANEOUS | Status: DC
  Administered 2017-03-26: 1 via TRANSDERMAL

## 2017-03-26 MED ORDER — OXYCODONE-ACETAMINOPHEN 5-325 MG PO TABS: 1.0000 | ORAL_TABLET | Freq: Four times a day (QID) | ORAL | 0 refills | Status: DC | PRN

## 2017-03-26 MED ORDER — METHOCARBAMOL 500 MG PO TABS: 500.0000 mg | ORAL_TABLET | Freq: Two times a day (BID) | ORAL | 0 refills | Status: DC

## 2017-03-26 MED ORDER — OXYCODONE-ACETAMINOPHEN 5-325 MG PO TABS
2.0000 | ORAL_TABLET | Freq: Once | ORAL | Status: AC
  Administered 2017-03-26: 2 via ORAL

## 2017-03-26 MED ORDER — LIDOCAINE 5 % EX PTCH
MEDICATED_PATCH | CUTANEOUS | Status: AC
  Filled 2017-03-26: qty 1

## 2017-03-26 MED ORDER — LIDOCAINE 5 % EX PTCH: 1.0000 | MEDICATED_PATCH | CUTANEOUS | 0 refills | Status: DC

## 2017-03-26 MED ORDER — OXYCODONE-ACETAMINOPHEN 5-325 MG PO TABS
ORAL_TABLET | ORAL | Status: AC
  Filled 2017-03-26: qty 2

## 2017-03-26 MED ORDER — IBUPROFEN 600 MG PO TABS: 600.0000 mg | ORAL_TABLET | Freq: Four times a day (QID) | ORAL | 0 refills | Status: DC | PRN

## 2017-03-26 MED FILL — OXYCODONE W/APAP 5/325 TAB: 5-325 | 2 days supply | Qty: 15 | Fill #0

## 2017-03-26 MED FILL — IBUPROFEN 600 MG TABLET: 600 | 8 days supply | Qty: 30 | Fill #0

## 2017-03-26 MED FILL — METHOCARBAMOL 500 MG TABLET: 500 | 7 days supply | Qty: 15 | Fill #0

## 2017-03-26 NOTE — Discharge Instructions (Signed)
Wear a shoulder sling for comfort, but remove this 3-4 times per day and try to stretch or shoulder. Avoid all lifting until evaluated by an orthopedic doctor. Call the orthopedist in the morning to schedule an appointment for close follow-up. We recommend consistent use of ibuprofen as well as application of a topical Lidoderm patch. You may take Percocet as needed for severe pain and Robaxin as needed for muscle spasms. Do not drive a motored vehicle or drink alcohol after taking Percocet or Robaxin as this may make you drowsy and impair your judgment. Return for new or concerning symptoms.

## 2017-03-26 NOTE — ED Triage Notes (Signed)
Patient reports on Monday he was moving 50Ib bags of sand when he started having discomfort in the left shoulder. That night, after showering he noticed it was hard to lift his left arm. He took IBUPROFEN around 21:30 with mild relief.

## 2017-03-26 NOTE — ED Notes (Signed)
Bed: WA07 Expected date:  Expected time:  Means of arrival:  Comments: 

## 2017-03-26 NOTE — ED Provider Notes (Signed)
[facility] DEPT Provider Note   CSN: [id number] Arrival date & time: 03/25/17  2158    History   Chief Complaint Chief Complaint  Patient presents with  . Shoulder Pain    HPI Timothy Peters is a 66 y.o. male.  66 year old male presents to the emergency department for evaluation of left shoulder pain. He reports moving multiple 50 pound bags of sand 2 days ago when he felt a slight discomfort in his left shoulder. This was associated with difficulty lifting his arm. 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 He had significant worsening of his shoulder pain after this time. He feels the pain radiate from his left shoulder down to his left hand. He has also appreciated some swelling to his left hand today making it difficult to make a fist. He took ibuprofen at 2130 without relief of his pain. He reports subjective paresthesias. No associated numbness or falls. He states that he is required to lift heavy objects at his job on occasion, but has never had this issue before.      Past Medical History:  Diagnosis Date  . Diabetes mellitus without complication ([place])   . Erectile dysfunction   . Gallstone 06/16   on CT, also present on U/S in Jan 2017  . Hyperlipidemia   . Hypertension     Patient Active Problem List   Diagnosis Date Noted  . Liver fibrosis 03/19/2016  . Multilevel degenerative disc disease 12/29/2015  . Hepatitis C, chronic ([place]) 10/30/2015  . Abdominal wall pain in both upper quadrants 09/23/2015  . Cholelithiasis 09/21/2015  . Type 2 diabetes, uncontrolled, with neuropathy ([place]) 03/04/2012  . Health care maintenance 03/04/2012  . HTN (hypertension) 10/30/2011  . Hyperlipidemia 10/30/2011  . Erectile dysfunction 10/30/2011  . History of heroin abuse 10/30/2011    Past Surgical History:  Procedure Laterality Date  . nerve damage left arm  1990       Home Medications    Prior to Admission medications     Medication Sig Start Date End Date Taking? Authorizing Provider  aspirin 81 MG tablet Take 81 mg by mouth daily.    [provider]  gabapentin (NEURONTIN) 300 MG capsule Take 300 mg by mouth 2 (two) times daily.    [provider]  glipiZIDE (GLUCOTROL) 5 MG tablet Take 1 tablet daily with either lunch or dinner. If blood sugar less than 150, hold your dose. Patient now has BCBS 12/04/16   Maryellen Pile, MD  glucose blood test strip Use as instructed 12/29/15   Riccardo Dubin, MD  ibuprofen (ADVIL,MOTRIN) 600 MG tablet Take 1 tablet (600 mg total) by mouth every 6 (six) hours as needed. 03/26/17   Antonietta Breach, PA-C  Insulin Glargine (LANTUS) 100 UNIT/ML Solostar Pen Inject 80 Units into the skin every morning. BCBS 12/04/16   Maryellen Pile, MD  Insulin Pen Needle 31G X 5 MM MISC 1 Units by Does not apply route as directed. 12/04/16   Maryellen Pile, MD  lidocaine (LIDODERM) 5 % Place 1 patch onto the skin daily. Remove & Discard patch within 12 hours or as directed by MD 03/26/17   Antonietta Breach, PA-C  losartan (COZAAR) 100 MG tablet Take 1 tablet (100 mg total) by mouth daily. BCBS 12/04/16   Maryellen Pile, MD  metFORMIN (GLUCOPHAGE) 1000 MG tablet Take 1 tablet (1,000 mg total) by mouth 2 (two) times daily with a meal. BCBS 12/04/16   Boswell,  Ovid Curd, MD  methocarbamol (ROBAXIN) 500 MG tablet Take 1 tablet (500 mg total) by mouth 2 (two) times daily. 03/26/17   Antonietta Breach, PA-C  metoprolol tartrate (LOPRESSOR) 25 MG tablet Take 0.5 tablets (12.5 mg total) by mouth 2 (two) times daily. BCBS 12/04/16 12/04/17  Maryellen Pile, MD  Multiple Vitamins-Minerals (MULTIVITAMIN WITH MINERALS) tablet Take 1 tablet by mouth daily.    [provider]  omega-3 acid ethyl esters (LOVAZA) 1 g capsule Take 2 capsules (2 g total) by mouth 2 (two) times daily. 09/18/16   Maryellen Pile, MD  oxyCODONE-acetaminophen (PERCOCET/ROXICET) 5-325 MG tablet Take 1-2 tablets by mouth every 6 (six)  hours as needed for severe pain. 03/26/17   Antonietta Breach, PA-C  sildenafil (VIAGRA) 100 MG tablet Take 100 mg by mouth daily as needed for erectile dysfunction.    [provider]  simvastatin (ZOCOR) 20 MG tablet Take 1 tablet (20 mg total) by mouth daily. BCBS 12/04/16   Maryellen Pile, MD    Family History Family History  Problem Relation Age of Onset  . Hypertension Mother   . Stroke Mother   . Heart attack Father   . Cancer Sister   . Heart attack Maternal Grandmother   . Hypertension Maternal Grandfather   . Stroke Paternal Grandmother   . Stroke Paternal Grandfather   . Cancer Brother   . Diabetes Other   . Hypertension Other   . Hyperlipidemia Other     Social History Social History  Substance Use Topics  . Smoking status: Former Smoker    Quit date: 11/30/2007  . Smokeless tobacco: Never Used  . Alcohol use No     Comment: Quit x 13 yrs.     Allergies   Patient has no known allergies.   Review of Systems Review of Systems Ten systems reviewed and are negative for acute change, except as noted in the HPI.    Physical Exam Updated Vital Signs BP (!) 147/98 (BP Location: Right Arm)   Pulse 72   Temp 97.8 F (36.6 C) (Oral)   Resp 16   Ht 5\' 11"  (1.803 m)   Wt 104.8 kg (231 lb)   SpO2 97%   BMI 32.22 kg/m   Physical Exam  Constitutional: He is oriented to person, place, and time. He appears well-developed and well-nourished. No distress.  Nontoxic and in NAD  HENT:  Head: Normocephalic and atraumatic.  Eyes: Conjunctivae and EOM are normal. No scleral icterus.  Neck: Normal range of motion.  Cardiovascular: Normal rate, regular rhythm and intact distal pulses.   Capillary refill brisk in all digits of the left hand. Distal radial pulse 2+ in the LUE.  Pulmonary/Chest: Effort normal. No respiratory distress. He has no wheezes.  Respirations even and unlabored  Musculoskeletal: He exhibits tenderness. He exhibits no deformity.  Tenderness to  palpation diffusely to the left shoulder. No bony deformity or crepitus. Limited range of motion of the left shoulder secondary to pain. There is also tenderness to the distal insertion site of the triceps. No bony tenderness to the left elbow. Mild nonpitting edema to the left hand limiting grip strength.  Neurological: He is alert and oriented to person, place, and time. He exhibits normal muscle tone. Coordination normal.  Sensation to light touch intact in bilateral upper extremities.  Skin: Skin is warm and dry. No rash noted. He is not diaphoretic. No erythema. No pallor.  Psychiatric: He has a normal mood and affect. His behavior is normal.  Nursing note and vitals reviewed.    ED Treatments / Results  Labs (all labs ordered are listed, but only abnormal results are displayed) Labs Reviewed - No data to display  EKG  EKG Interpretation None       Radiology No results found.  Procedures Procedures (including critical care time)  Medications Ordered in ED Medications  lidocaine (LIDODERM) 5 % 1 patch (1 patch Transdermal Patch Applied 03/26/17 0211)  oxyCODONE-acetaminophen (PERCOCET/ROXICET) 5-325 MG per tablet (not administered)  lidocaine (LIDODERM) 5 % (not administered)  oxyCODONE-acetaminophen (PERCOCET/ROXICET) 5-325 MG per tablet 2 tablet (2 tablets Oral Given 03/26/17 0211)     Initial Impression / Assessment and Plan / ED Course  I have reviewed the triage vital signs and the nursing notes.  Pertinent labs & imaging results that were available during my care of the patient were reviewed by me and considered in my medical decision making (see chart for details).     66 year old male presents to the emergency department for left shoulder pain. Pain is worse with movement. He is neurovascularly intact. No bony deformity or crepitus. Symptom onset after frequent heavy lifting at his job yesterday. Suspect rotator cuff injury. Doubt fracture. Sling provided for  comfort and frequent stretching advised. Will refer to orthopedics for follow-up as patient may require physical therapy and, potential, future MRI. Prescriptions for pain medication, anti-inflammatories, and muscle relaxers provided. Will also place on work restrictions. Return precautions discussed and provided. Patient discharged in satisfactory condition with no unaddressed concerns.   Final Clinical Impressions(s) / ED Diagnoses   Final diagnoses:  Rotator cuff injury, initial encounter  Acute pain of left shoulder    New Prescriptions Discharge Medication List as of 03/26/2017  2:11 AM    START taking these medications   Details  ibuprofen (ADVIL,MOTRIN) 600 MG tablet Take 1 tablet (600 mg total) by mouth every 6 (six) hours as needed., Starting Wed 03/26/2017, Print    lidocaine (LIDODERM) 5 % Place 1 patch onto the skin daily. Remove & Discard patch within 12 hours or as directed by MD, Starting Wed 03/26/2017, Print    methocarbamol (ROBAXIN) 500 MG tablet Take 1 tablet (500 mg total) by mouth 2 (two) times daily., Starting Wed 03/26/2017, Print    oxyCODONE-acetaminophen (PERCOCET/ROXICET) 5-325 MG tablet Take 1-2 tablets by mouth every 6 (six) hours as needed for severe pain., Starting Wed 03/26/2017, Print         Antonietta Breach, PA-C 03/26/17 0301    Orpah Greek, MD 03/26/17 0730

## 2017-03-28 MED FILL — OXYCODONE-ACETAMINOPHEN 5-3: 5-325 | 5 days supply | Qty: 20 | Fill #0

## 2017-04-02 MED FILL — glipiZIDE 5 MG TABS: 5 | 30 days supply | Qty: 30 | Fill #3

## 2017-04-14 MED FILL — LANTUS SOLOSTAR 100 UNITS/M: 100 | 30 days supply | Qty: 30 | Fill #1

## 2017-05-01 DIAGNOSIS — E1065 Type 1 diabetes mellitus with hyperglycemia: Secondary | ICD-10-CM | POA: Diagnosis not present

## 2017-05-01 DIAGNOSIS — H40059 Ocular hypertension, unspecified eye: Secondary | ICD-10-CM | POA: Diagnosis not present

## 2017-05-01 DIAGNOSIS — E103493 Type 1 diabetes mellitus with severe nonproliferative diabetic retinopathy without macular edema, bilateral: Secondary | ICD-10-CM | POA: Diagnosis not present

## 2017-05-02 ENCOUNTER — Other Ambulatory Visit: Payer: Self-pay | Admitting: Internal Medicine

## 2017-05-02 DIAGNOSIS — E1140 Type 2 diabetes mellitus with diabetic neuropathy, unspecified: Secondary | ICD-10-CM

## 2017-05-02 DIAGNOSIS — E1165 Type 2 diabetes mellitus with hyperglycemia: Principal | ICD-10-CM

## 2017-05-02 DIAGNOSIS — IMO0002 Reserved for concepts with insufficient information to code with codable children: Secondary | ICD-10-CM

## 2017-05-07 MED FILL — glipiZIDE 5 MG TABS: 5 | 90 days supply | Qty: 90 | Fill #0

## 2017-05-12 MED FILL — LANTUS SOLOSTAR 100 UNITS/M: 100 | 30 days supply | Qty: 30 | Fill #2

## 2017-05-26 DIAGNOSIS — H35033 Hypertensive retinopathy, bilateral: Secondary | ICD-10-CM | POA: Diagnosis not present

## 2017-05-26 DIAGNOSIS — H3582 Retinal ischemia: Secondary | ICD-10-CM | POA: Diagnosis not present

## 2017-05-26 DIAGNOSIS — E113313 Type 2 diabetes mellitus with moderate nonproliferative diabetic retinopathy with macular edema, bilateral: Secondary | ICD-10-CM | POA: Diagnosis not present

## 2017-05-26 DIAGNOSIS — H43813 Vitreous degeneration, bilateral: Secondary | ICD-10-CM | POA: Diagnosis not present

## 2017-05-26 MED FILL — METOPROLOL SUCC ER 25 MG TA: 25 | 90 days supply | Qty: 90 | Fill #1

## 2017-05-26 MED FILL — SIMVASTATIN 20 MG TABLET: 20 | 90 days supply | Qty: 90 | Fill #1

## 2017-06-02 MED FILL — LOSARTAN POTASSIUM 100 MG T: 100 | 90 days supply | Qty: 90 | Fill #1

## 2017-06-05 DIAGNOSIS — M5136 Other intervertebral disc degeneration, lumbar region: Secondary | ICD-10-CM | POA: Diagnosis not present

## 2017-06-05 DIAGNOSIS — M48062 Spinal stenosis, lumbar region with neurogenic claudication: Secondary | ICD-10-CM | POA: Diagnosis not present

## 2017-06-05 DIAGNOSIS — M5416 Radiculopathy, lumbar region: Secondary | ICD-10-CM | POA: Diagnosis not present

## 2017-06-05 DIAGNOSIS — M545 Low back pain: Secondary | ICD-10-CM | POA: Diagnosis not present

## 2017-06-05 MED FILL — traMADol HCL 50 MG TABS: 50 | 10 days supply | Qty: 40 | Fill #0

## 2017-06-05 MED FILL — IBUPROFEN 800 MG TABS: 800 | 30 days supply | Qty: 90 | Fill #0

## 2017-06-05 MED FILL — GABAPENTIN 100 MG CAP: 100 | 30 days supply | Qty: 90 | Fill #0

## 2017-06-09 DIAGNOSIS — E113313 Type 2 diabetes mellitus with moderate nonproliferative diabetic retinopathy with macular edema, bilateral: Secondary | ICD-10-CM | POA: Diagnosis not present

## 2017-06-13 DIAGNOSIS — M48062 Spinal stenosis, lumbar region with neurogenic claudication: Secondary | ICD-10-CM | POA: Diagnosis not present

## 2017-06-13 MED FILL — LANTUS SOLOSTAR 100 UNITS/M: 100 | 30 days supply | Qty: 30 | Fill #3

## 2017-06-18 DIAGNOSIS — M5136 Other intervertebral disc degeneration, lumbar region: Secondary | ICD-10-CM | POA: Diagnosis not present

## 2017-06-18 DIAGNOSIS — M48062 Spinal stenosis, lumbar region with neurogenic claudication: Secondary | ICD-10-CM | POA: Diagnosis not present

## 2017-07-07 DIAGNOSIS — H35033 Hypertensive retinopathy, bilateral: Secondary | ICD-10-CM | POA: Diagnosis not present

## 2017-07-07 DIAGNOSIS — H3582 Retinal ischemia: Secondary | ICD-10-CM | POA: Diagnosis not present

## 2017-07-07 DIAGNOSIS — H43813 Vitreous degeneration, bilateral: Secondary | ICD-10-CM | POA: Diagnosis not present

## 2017-07-07 DIAGNOSIS — E113313 Type 2 diabetes mellitus with moderate nonproliferative diabetic retinopathy with macular edema, bilateral: Secondary | ICD-10-CM | POA: Diagnosis not present

## 2017-07-11 MED FILL — LANTUS SOLOSTAR 100 UNITS/M: 100 | 30 days supply | Qty: 30 | Fill #4

## 2017-07-18 DIAGNOSIS — E663 Overweight: Secondary | ICD-10-CM | POA: Diagnosis not present

## 2017-07-18 DIAGNOSIS — I10 Essential (primary) hypertension: Secondary | ICD-10-CM | POA: Diagnosis not present

## 2017-07-18 DIAGNOSIS — I700 Atherosclerosis of aorta: Secondary | ICD-10-CM | POA: Diagnosis not present

## 2017-07-18 DIAGNOSIS — E7800 Pure hypercholesterolemia, unspecified: Secondary | ICD-10-CM | POA: Diagnosis not present

## 2017-07-18 DIAGNOSIS — E119 Type 2 diabetes mellitus without complications: Secondary | ICD-10-CM | POA: Diagnosis not present

## 2017-07-23 MED FILL — HYDROCODON-APAP 5-325: 5-325 | 10 days supply | Qty: 20 | Fill #0

## 2017-07-28 MED FILL — IBUPROFEN 800 MG TABS: 800 | 30 days supply | Qty: 90 | Fill #1

## 2017-07-31 MED FILL — glipiZIDE 5 MG TABS: 5 | 90 days supply | Qty: 90 | Fill #1

## 2017-08-04 MED FILL — LANTUS SOLOSTAR 100 UNITS/M: 100 | 30 days supply | Qty: 30 | Fill #5

## 2017-08-06 MED FILL — HYDROCODON-APAP 5-325: 5-325 | 10 days supply | Qty: 20 | Fill #0

## 2017-08-07 DIAGNOSIS — H35033 Hypertensive retinopathy, bilateral: Secondary | ICD-10-CM | POA: Diagnosis not present

## 2017-08-07 DIAGNOSIS — H43813 Vitreous degeneration, bilateral: Secondary | ICD-10-CM | POA: Diagnosis not present

## 2017-08-07 DIAGNOSIS — H3582 Retinal ischemia: Secondary | ICD-10-CM | POA: Diagnosis not present

## 2017-08-07 DIAGNOSIS — E113313 Type 2 diabetes mellitus with moderate nonproliferative diabetic retinopathy with macular edema, bilateral: Secondary | ICD-10-CM | POA: Diagnosis not present

## 2017-08-18 MED FILL — OMEGA-3 ETHYL ESTERS 1 GM C: 1 | 90 days supply | Qty: 180 | Fill #1

## 2017-08-21 MED FILL — SIMVASTATIN 20 MG TABLET: 20 | 90 days supply | Qty: 90 | Fill #2

## 2017-08-21 MED FILL — METOPROLOL SUCC ER 25 MG TA: 25 | 90 days supply | Qty: 90 | Fill #2

## 2017-09-01 MED FILL — LOSARTAN POTASSIUM 100 MG T: 100 | 90 days supply | Qty: 90 | Fill #2

## 2017-09-04 MED FILL — LANTUS SOLOSTAR 100 UNITS/M: 100 | 30 days supply | Qty: 30 | Fill #6

## 2017-09-15 MED FILL — HYDROCODON-APAP 5-325: 5-325 | 10 days supply | Qty: 40 | Fill #0

## 2017-09-22 DIAGNOSIS — H3582 Retinal ischemia: Secondary | ICD-10-CM | POA: Diagnosis not present

## 2017-09-22 DIAGNOSIS — H35033 Hypertensive retinopathy, bilateral: Secondary | ICD-10-CM | POA: Diagnosis not present

## 2017-09-22 DIAGNOSIS — E113313 Type 2 diabetes mellitus with moderate nonproliferative diabetic retinopathy with macular edema, bilateral: Secondary | ICD-10-CM | POA: Diagnosis not present

## 2017-09-22 DIAGNOSIS — H43813 Vitreous degeneration, bilateral: Secondary | ICD-10-CM | POA: Diagnosis not present

## 2017-09-23 DIAGNOSIS — M48061 Spinal stenosis, lumbar region without neurogenic claudication: Secondary | ICD-10-CM | POA: Diagnosis not present

## 2017-09-23 MED FILL — GABAPENTIN 300 MG CAPSULE: 300 | 30 days supply | Qty: 30 | Fill #0

## 2017-10-01 MED FILL — LANTUS SOLOSTAR 100 UNITS/M: 100 | 30 days supply | Qty: 30 | Fill #7

## 2017-10-03 ENCOUNTER — Ambulatory Visit: Payer: Self-pay | Admitting: Orthopedic Surgery

## 2017-10-10 ENCOUNTER — Ambulatory Visit: Payer: Self-pay | Admitting: Orthopedic Surgery

## 2017-10-10 NOTE — H&P (Signed)
Timothy Peters is an 67 y.o. male.   Chief Complaint: back and leg pain HPI: Reported by patient. Location (Lower Extremity): lower back pain bilateral; leg pain bilateral, ,  Severity: pain level 9/10  Timing: constant  Aggravating Factors: standing for ; standing, walking, and lying down aggravate the pain  Associated Symptoms: numbness/tingling (BLE)  Medications: Hydrocodone does not help Notes: Timothy Peters follows up, reports ongoing and worsening symptoms since he was last seen in October. He is c/o pain in the buttocks, lateral hip, down the posterolateral leg to the knee and into the front of the lower leg. He does have a hx of diabetic neuropathy but reports increased numbness as his pain has worsened as well. The left leg is more severe than the right. He cannot tolerate standing or walking for any more than a few minutes until he needs to sit to alleviate the pain. He reports no pain sitting, driving. He has pain when he lays down at night as well, into the legs, regardless of position he is sleeping in. He does have gabapentin at home which he typically does not take as it has not seemed to help in the past. He believes that is a 100mg  strength at home. He has been taking Norco but that does not help much, the only thing providing relief besides sitting is horse linament. He feels like the legs are starting to cramp constantly. He is diabetic but reports fairly good control with an A1C in the 7's. He was cleared by Dr. Delfina Redwood for decompression. He is wanting to proceed with surgery at this point. 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  Past Medical History:  Diagnosis Date  . Diabetes mellitus without complication ([place])   . Erectile dysfunction   . Gallstone 06/16   on CT, also present on U/S in Jan 2017  . Hyperlipidemia    . Hypertension     Past Surgical History:  Procedure Laterality Date  . nerve damage left arm  1990    Family History  Problem Relation Age of Onset  . Hypertension Mother   . Stroke Mother   . Heart attack Father   . Cancer Sister   . Heart attack Maternal Grandmother   . Hypertension Maternal Grandfather   . Stroke Paternal Grandmother   . Stroke Paternal Grandfather   . Cancer Brother   . Diabetes Other   . Hypertension Other   . Hyperlipidemia Other    Social History:  reports that he quit smoking about 9 years ago. he has never used smokeless tobacco. He reports that he does not drink alcohol or use drugs.  Former smoker  Allergies: No Known Allergies  Past Medical History HTN Hypercholesterolemia DM  Medication History gabapentin 300 mg capsule glipiZIDE 5 mg tablet HYDROcodone 5 mg-acetaminophen 325 mg tablet ibuprofen 800 mg tablet Lantus Solostar U-100 Insulin 100 unit/mL (3 mL) subcutaneous pen losartan 100 mg tablet metFORMIN 1,000 mg tablet metoprolol tartrate 25 mg tablet omega-3 acid ethyl esters 1 gram capsule simvastatin 20 mg tablet  Review of Systems  Constitutional: Negative.   HENT: Negative.   Eyes: Negative.   Respiratory: Negative.   Cardiovascular: Negative.   Gastrointestinal: Negative.   Genitourinary: Negative.   Musculoskeletal: Positive for back pain.  Skin: Negative.   Neurological: Positive for sensory change and  focal weakness.  Psychiatric/Behavioral: Negative.     There were no vitals taken for this visit. Physical Exam  Constitutional: He is oriented to person, place, and time. He appears well-developed.  HENT:  Head: Normocephalic.  Eyes: Pupils are equal, round, and reactive to light.  Neck: Normal range of motion.  Cardiovascular: Normal rate.  Respiratory: Effort normal.  GI: Soft.  Musculoskeletal:  Patient is a 67 year old male.  AAOx3. Well nourished, well developed. Seated on exam in no acute  distress. He ambulates with forward flexion in the lumbar spine but no assistive devices.  Nontender to palpation on the lumbar spinous processes, paraspinous musculature, greater trochanters of the hips. Mildly tender upper outer buttock bilaterally. Positive straight leg raise bilaterally reproducing buttock and leg pain, left more severe than right. Significantly decreased extension Lspine, moderately decreased flexion. No weakness through the lower extremities, 5/5 with hip flexors, quads, hamstrings, plantar and dorsiflexion, EHL. No calf pain or sign of DVT. Decreased sensation in the feet specifically in the L5 dermatomal distribution.  Neurological: He is alert and oriented to person, place, and time.  Skin: Skin is warm and dry.    Prior xrays and MRI reviewed again today by Dr. 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 There is associated foraminal narrowing at L4-5 due to this.  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 Discussed proceeding with surgery as his symptoms have worsened. After review of his xrays and MRI in detail by Dr. Tonita Cong would again recommend proceeding with microlumbar decompression L3-4 and L4-5 with coflex insetion at L3-4 and L4-5. Given his specific anatomy with the listhesis at L4-5 would require this for stabilization otherwise would likely require multilevel instrumented fusion. We discussed the procedure itself as well as risks, complications and alternatives including but not limited to DVT, PE, failure of procedure, need for secondary procedure, anesthesia risk, even death. Discussed post-op protocols, need for PT, walking, time out of work up to 12 weeks given his line of work, note for work given. Discussed a  short term disability policy through work as well. He has already been cleared and would like to proceed ASAP due to his level of pain. We will proceed accordingly with scheduling. Dr. Tonita Cong did discuss this with the [employer] rep Lanny Hurst today. Pt was seen in conjunction with Dr. Tonita Cong today.  Plan microlumbar decompression L3-4, L4-5 with insertion of coflex L3-4 and L4-5  Bertie Mcconathy, Conley Rolls., PA-C for Dr. Tonita Cong 10/10/2017, 4:31 PM

## 2017-10-10 NOTE — H&P (View-Only) (Signed)
Timothy Peters is an 67 y.o. male.   Chief Complaint: back and leg pain HPI: Reported by patient. Location (Lower Extremity): lower back pain bilateral; leg pain bilateral, ,  Severity: pain level 9/10  Timing: constant  Aggravating Factors: standing for ; standing, walking, and lying down aggravate the pain  Associated Symptoms: numbness/tingling (BLE)  Medications: Hydrocodone does not help Notes: Timothy Peters follows up, reports ongoing and worsening symptoms since he was last seen in October. He is c/o pain in the buttocks, lateral hip, down the posterolateral leg to the knee and into the front of the lower leg. He does have a hx of diabetic neuropathy but reports increased numbness as his pain has worsened as well. The left leg is more severe than the right. He cannot tolerate standing or walking for any more than a few minutes until he needs to sit to alleviate the pain. He reports no pain sitting, driving. He has pain when he lays down at night as well, into the legs, regardless of position he is sleeping in. He does have gabapentin at home which he typically does not take as it has not seemed to help in the past. He believes that is a 100mg  strength at home. He has been taking Norco but that does not help much, the only thing providing relief besides sitting is horse linament. He feels like the legs are starting to cramp constantly. He is diabetic but reports fairly good control with an A1C in the 7's. He was cleared by Dr. Delfina Redwood for decompression. He is wanting to proceed with surgery at this point. 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  Past Medical History:  Diagnosis Date  . Diabetes mellitus without complication ([place])   . Erectile dysfunction   . Gallstone 06/16   on CT, also present on U/S in Jan 2017  . Hyperlipidemia    . Hypertension     Past Surgical History:  Procedure Laterality Date  . nerve damage left arm  1990    Family History  Problem Relation Age of Onset  . Hypertension Mother   . Stroke Mother   . Heart attack Father   . Cancer Sister   . Heart attack Maternal Grandmother   . Hypertension Maternal Grandfather   . Stroke Paternal Grandmother   . Stroke Paternal Grandfather   . Cancer Brother   . Diabetes Other   . Hypertension Other   . Hyperlipidemia Other    Social History:  reports that he quit smoking about 9 years ago. he has never used smokeless tobacco. He reports that he does not drink alcohol or use drugs.  Former smoker  Allergies: No Known Allergies  Past Medical History HTN Hypercholesterolemia DM  Medication History gabapentin 300 mg capsule glipiZIDE 5 mg tablet HYDROcodone 5 mg-acetaminophen 325 mg tablet ibuprofen 800 mg tablet Lantus Solostar U-100 Insulin 100 unit/mL (3 mL) subcutaneous pen losartan 100 mg tablet metFORMIN 1,000 mg tablet metoprolol tartrate 25 mg tablet omega-3 acid ethyl esters 1 gram capsule simvastatin 20 mg tablet  Review of Systems  Constitutional: Negative.   HENT: Negative.   Eyes: Negative.   Respiratory: Negative.   Cardiovascular: Negative.   Gastrointestinal: Negative.   Genitourinary: Negative.   Musculoskeletal: Positive for back pain.  Skin: Negative.   Neurological: Positive for sensory change and  focal weakness.  Psychiatric/Behavioral: Negative.     There were no vitals taken for this visit. Physical Exam  Constitutional: He is oriented to person, place, and time. He appears well-developed.  HENT:  Head: Normocephalic.  Eyes: Pupils are equal, round, and reactive to light.  Neck: Normal range of motion.  Cardiovascular: Normal rate.  Respiratory: Effort normal.  GI: Soft.  Musculoskeletal:  Patient is a 67 year old male.  AAOx3. Well nourished, well developed. Seated on exam in no acute  distress. He ambulates with forward flexion in the lumbar spine but no assistive devices.  Nontender to palpation on the lumbar spinous processes, paraspinous musculature, greater trochanters of the hips. Mildly tender upper outer buttock bilaterally. Positive straight leg raise bilaterally reproducing buttock and leg pain, left more severe than right. Significantly decreased extension Lspine, moderately decreased flexion. No weakness through the lower extremities, 5/5 with hip flexors, quads, hamstrings, plantar and dorsiflexion, EHL. No calf pain or sign of DVT. Decreased sensation in the feet specifically in the L5 dermatomal distribution.  Neurological: He is alert and oriented to person, place, and time.  Skin: Skin is warm and dry.    Prior xrays and MRI reviewed again today by Dr. 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 There is associated foraminal narrowing at L4-5 due to this.  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 Discussed proceeding with surgery as his symptoms have worsened. After review of his xrays and MRI in detail by Dr. Tonita Cong would again recommend proceeding with microlumbar decompression L3-4 and L4-5 with coflex insetion at L3-4 and L4-5. Given his specific anatomy with the listhesis at L4-5 would require this for stabilization otherwise would likely require multilevel instrumented fusion. We discussed the procedure itself as well as risks, complications and alternatives including but not limited to DVT, PE, failure of procedure, need for secondary procedure, anesthesia risk, even death. Discussed post-op protocols, need for PT, walking, time out of work up to 12 weeks given his line of work, note for work given. Discussed a  short term disability policy through work as well. He has already been cleared and would like to proceed ASAP due to his level of pain. We will proceed accordingly with scheduling. Dr. Tonita Cong did discuss this with the [employer] rep Lanny Hurst today. Pt was seen in conjunction with Dr. Tonita Cong today.  Plan microlumbar decompression L3-4, L4-5 with insertion of coflex L3-4 and L4-5  Brigida Scotti, Conley Rolls., PA-C for Dr. Tonita Cong 10/10/2017, 4:31 PM

## 2017-10-20 DIAGNOSIS — E113313 Type 2 diabetes mellitus with moderate nonproliferative diabetic retinopathy with macular edema, bilateral: Secondary | ICD-10-CM | POA: Diagnosis not present

## 2017-10-20 DIAGNOSIS — H43813 Vitreous degeneration, bilateral: Secondary | ICD-10-CM | POA: Diagnosis not present

## 2017-10-20 DIAGNOSIS — H35033 Hypertensive retinopathy, bilateral: Secondary | ICD-10-CM | POA: Diagnosis not present

## 2017-10-20 DIAGNOSIS — H3582 Retinal ischemia: Secondary | ICD-10-CM | POA: Diagnosis not present

## 2017-10-21 NOTE — Pre-Procedure Instructions (Signed)
Timothy Peters  10/21/2017      [place], [place] - [address] [facility]. [address] [place] [address] Phone: [phone] Fax: [phone]    Your procedure is scheduled on Thurs. March 14  Report to [facility] Admitting at 5:30 A.M.  Call this number if you have problems the morning of surgery:  [phone]   Remember:  Do not eat food or drink liquids after midnight on Wed. March 13   Take these medicines the morning of surgery with A SIP OF WATER : metoprolol (toprolol-xl)               7 days prior to surgery STOP taking any Aspirin(unless otherwise instructed by your surgeon), Aleve, Naproxen, Ibuprofen, Motrin, Advil, Goody's, BC's, all herbal medications, fish oil, and all vitamins                 How to Manage Your Diabetes Before and After Surgery  Why is it important to control my blood sugar before and after surgery? . Improving blood sugar levels before and after surgery helps healing and can limit problems. . A way of improving blood sugar control is eating a healthy diet by: o  Eating less sugar and carbohydrates o  Increasing activity/exercise o  Talking with your doctor about reaching your blood sugar goals . High blood sugars (greater than 180 mg/dL) can raise your risk of infections and slow your recovery, so you will need to focus on controlling your diabetes during the weeks before surgery. . Make sure that the doctor who takes care of your diabetes knows about your planned surgery including the date and location.  How do I manage my blood sugar before surgery? . Check your blood sugar at least 4 times a day, starting 2 days before surgery, to make sure that the level is not too high or low. o Check your blood sugar the morning of your surgery when you wake up and every 2 hours until you get to the Short Stay unit. . If your blood sugar is less than 70 mg/dL, you will need to treat for low  blood sugar: o Do not take insulin. o Treat a low blood sugar (less than 70 mg/dL) with  cup of clear juice (cranberry or apple), 4 glucose tablets, OR glucose gel. Recheck blood sugar in 15 minutes after treatment (to make sure it is greater than 70 mg/dL). If your blood sugar is not greater than 70 mg/dL on recheck, call [phone] o  for further instructions. . Report your blood sugar to the short stay nurse when you get to Short Stay.  . If you are admitted to the hospital after surgery: o Your blood sugar will be checked by the staff and you will probably be given insulin after surgery (instead of oral diabetes medicines) to make sure you have good blood sugar levels. o The goal for blood sugar control after surgery is 80-180 mg/dL.      WHAT DO I DO ABOUT MY DIABETES MEDICATION?   Marland Kitchen Do not take oral diabetes medicines (pills) the morning of surgery.       . THE MORNING OF SURGERY, take ________50_____ units of _____LANTUS_____insulin.     Do not wear jewelry.              Do not wear lotions, powders, or perfumes, or deodorant.  Do not shave 48 hours prior to surgery.  Men  may shave face and neck.  Do not bring valuables to the hospital.  [facility] is not responsible for any belongings or valuables.  Contacts, dentures or bridgework may not be worn into surgery.  Leave your suitcase in the car.  After surgery it may be brought to your room.  For patients admitted to the hospital, discharge time will be determined by your treatment team.  Patients discharged the day of surgery will not be allowed to drive home.    Special instructions:   [place]- Preparing For Surgery  Before surgery, you can play an important role. Because skin is not sterile, your skin needs to be as free of germs as possible. You can reduce the number of germs on your skin by washing with CHG (chlorahexidine gluconate) Soap before surgery.  CHG is an antiseptic cleaner which kills germs and  bonds with the skin to continue killing germs even after washing.  Please do not use if you have an allergy to CHG or antibacterial soaps. If your skin becomes reddened/irritated stop using the CHG.  Do not shave (including legs and underarms) for at least 48 hours prior to first CHG shower. It is OK to shave your face.  Please follow these instructions carefully.   1. Shower the NIGHT BEFORE SURGERY and the MORNING OF SURGERY with CHG.   2. If you chose to wash your hair, wash your hair first as usual with your normal shampoo.  3. After you shampoo, rinse your hair and body thoroughly to remove the shampoo.  4. Use CHG as you would any other liquid soap. You can apply CHG directly to the skin and wash gently with a scrungie or a clean washcloth.   5. Apply the CHG Soap to your body ONLY FROM THE NECK DOWN.  Do not use on open wounds or open sores. Avoid contact with your eyes, ears, mouth and genitals (private parts). Wash Face and genitals (private parts)  with your normal soap.  6. Wash thoroughly, paying special attention to the area where your surgery will be performed.  7. Thoroughly rinse your body with warm water from the neck down.  8. DO NOT shower/wash with your normal soap after using and rinsing off the CHG Soap.  9. Pat yourself dry with a CLEAN TOWEL.  10. Wear CLEAN PAJAMAS to bed the night before surgery, wear comfortable clothes the morning of surgery  11. Place CLEAN SHEETS on your bed the night of your first shower and DO NOT SLEEP WITH PETS.    Day of Surgery: Do not apply any deodorants/lotions. Please wear clean clothes to the hospital/surgery center.      Please read over the following fact sheets that you were given. Coughing and Deep Breathing, MRSA Information and Surgical Site Infection Prevention

## 2017-10-22 ENCOUNTER — Other Ambulatory Visit: Payer: Self-pay

## 2017-10-22 ENCOUNTER — Encounter (HOSPITAL_COMMUNITY)
Admission: RE | Admit: 2017-10-22 | Discharge: 2017-10-22 | Disposition: A | Discharge status: Home / Self Care | Payer: BLUE CROSS/BLUE SHIELD | Source: Ambulatory Visit | Attending: Specialist | Admitting: Specialist

## 2017-10-22 ENCOUNTER — Encounter (HOSPITAL_COMMUNITY)
Admission: RE | Admit: 2017-10-22 | Discharge: 2017-10-22 | Disposition: A | Discharge status: Home / Self Care | Payer: BLUE CROSS/BLUE SHIELD | Source: Ambulatory Visit | Attending: Orthopedic Surgery | Admitting: Orthopedic Surgery

## 2017-10-22 ENCOUNTER — Encounter (HOSPITAL_COMMUNITY): Payer: Self-pay

## 2017-10-22 DIAGNOSIS — M5126 Other intervertebral disc displacement, lumbar region: Secondary | ICD-10-CM

## 2017-10-22 DIAGNOSIS — Z01818 Encounter for other preprocedural examination: Secondary | ICD-10-CM | POA: Diagnosis not present

## 2017-10-22 DIAGNOSIS — K8020 Calculus of gallbladder without cholecystitis without obstruction: Secondary | ICD-10-CM | POA: Insufficient documentation

## 2017-10-22 DIAGNOSIS — Z01812 Encounter for preprocedural laboratory examination: Secondary | ICD-10-CM | POA: Insufficient documentation

## 2017-10-22 DIAGNOSIS — Z01810 Encounter for preprocedural cardiovascular examination: Secondary | ICD-10-CM | POA: Diagnosis not present

## 2017-10-22 HISTORY — DX: Spinal stenosis, site unspecified: M48.00

## 2017-10-22 HISTORY — DX: Inflammatory liver disease, unspecified: K75.9

## 2017-10-22 LAB — COMPREHENSIVE METABOLIC PANEL
ALT: 22 U/L (ref 17–63)
AST: 24 U/L (ref 15–41)
Albumin: 3.8 g/dL (ref 3.5–5.0)
Alkaline Phosphatase: 75 U/L (ref 38–126)
Anion gap: 10 (ref 5–15)
BUN: 15 mg/dL (ref 6–20)
CO2: 24 mmol/L (ref 22–32)
Calcium: 9.6 mg/dL (ref 8.9–10.3)
Chloride: 106 mmol/L (ref 101–111)
Creatinine, Ser: 1.01 mg/dL (ref 0.61–1.24)
GFR calc Af Amer: >60 mL/min (ref >60)
GFR calc non Af Amer: >60 mL/min (ref >60)
Glucose, Bld: 138 mg/dL — ABNORMAL HIGH (ref 65–99)
Potassium: 4.4 mmol/L (ref 3.5–5.1)
Sodium: 140 mmol/L (ref 135–145)
Total Bilirubin: 0.6 mg/dL (ref 0.3–1.2)
Total Protein: 7.5 g/dL (ref 6.5–8.1)

## 2017-10-22 LAB — CBC
HCT: 38.5 % — ABNORMAL LOW (ref 39.0–52.0)
Hemoglobin: 12.2 g/dL — ABNORMAL LOW (ref 13.0–17.0)
MCH: 27 pg (ref 26.0–34.0)
MCHC: 31.7 g/dL (ref 30.0–36.0)
MCV: 85.2 fL (ref 78.0–100.0)
Platelets: 306 10*3/uL (ref 150–400)
RBC: 4.52 MIL/uL (ref 4.22–5.81)
RDW: 13.5 % (ref 11.5–15.5)
WBC: 9.8 10*3/uL (ref 4.0–10.5)

## 2017-10-22 LAB — GLUCOSE, CAPILLARY: Glucose-Capillary: 140 mg/dL — ABNORMAL HIGH (ref 65–99)

## 2017-10-22 LAB — SURGICAL PCR SCREEN
MRSA, PCR: NEGATIVE
Staphylococcus aureus: POSITIVE — AB

## 2017-10-22 LAB — HEMOGLOBIN A1C
Hgb A1c MFr Bld: 7.6 % — ABNORMAL HIGH (ref 4.8–5.6)
Mean Plasma Glucose: 171.42 mg/dL

## 2017-10-22 MED FILL — IBUPROFEN 800 MG TAB: 800 | 30 days supply | Qty: 90 | Fill #0

## 2017-10-22 MED FILL — HYDROCODON-APAP 5-325: 5-325 | 10 days supply | Qty: 40 | Fill #0

## 2017-10-22 NOTE — Progress Notes (Signed)
PCP - Dr. Seward Carol  Cardiologist - Dr. Fabian November seen 2017  Chest x-ray - 10/22/17- Lumbar  EKG - 10/22/17  Stress Test - 03/2015 (E)  ECHO - 03/2015 (E)  Cardiac Cath - Denies  Sleep Study - No CPAP - None  LABS- 10/22/17: CBC, CMP  HA1C- 10/22/17 Fasting Blood Sugar - Today 140 Checks Blood Sugar __1___ times a week  ASA-last dose 3/6  Pt sts he was cleared by Dr. Delfina Redwood for surgery, and has not seen Dr. Claiborne Billings since 2017 due to insurance issues.  Anesthesia- No  Pt denies having chest pain, sob, or fever at this time. All instructions explained to the pt, with a verbal understanding of the material. Pt agrees to go over the instructions while at home for a better understanding. The opportunity to ask questions was provided.

## 2017-10-23 MED FILL — MUPIROCIN 2% OINTMENT: 2 | 5 days supply | Qty: 22 | Fill #0

## 2017-10-27 ENCOUNTER — Ambulatory Visit: Payer: Self-pay | Admitting: Orthopedic Surgery

## 2017-10-27 NOTE — H&P (Signed)
Timothy Peters is an 67 y.o. male.   Chief Complaint: back and leg pain HPI: Reported by patient. Location (Lower Extremity): lower back pain bilateral; leg pain bilateral, ,  Severity: pain level 9/10  Timing: constant  Aggravating Factors: standing for ; standing, walking, and lying down aggravate the pain  Associated Symptoms: numbness/tingling (BLE)  Medications: Hydrocodone does not help Notes: Timothy Peters follows up, reports ongoing and worsening symptoms since he was last seen in October. He is c/o pain in the buttocks, lateral hip, down the posterolateral leg to the knee and into the front of the lower leg. He does have a hx of diabetic neuropathy but reports increased numbness as his pain has worsened as well. The left leg is more severe than the right. He cannot tolerate standing or walking for any more than a few minutes until he needs to sit to alleviate the pain. He reports no pain sitting, driving. He has pain when he lays down at night as well, into the legs, regardless of position he is sleeping in. He does have gabapentin at home which he typically does not take as it has not seemed to help in the past. He believes that is a 100mg  strength at home. He has been taking Norco but that does not help much, the only thing providing relief besides sitting is horse linament. He feels like the legs are starting to cramp constantly. He is diabetic but reports fairly good control with an A1C in the 7's. He was cleared by Dr. Delfina Redwood for decompression. He is wanting to proceed with surgery at this point. 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      Past Medical History:  Diagnosis Date  . Diabetes mellitus without complication ([place])   . Erectile dysfunction   . Gallstone 06/16   on CT, also present on U/S in Jan 2017  .  Hyperlipidemia   . Hypertension          Past Surgical History:  Procedure Laterality Date  . nerve damage left arm  1990         Family History  Problem Relation Age of Onset  . Hypertension Mother   . Stroke Mother   . Heart attack Father   . Cancer Sister   . Heart attack Maternal Grandmother   . Hypertension Maternal Grandfather   . Stroke Paternal Grandmother   . Stroke Paternal Grandfather   . Cancer Brother   . Diabetes Other   . Hypertension Other   . Hyperlipidemia Other    Social History:  reports that he quit smoking about 9 years ago. he has never used smokeless tobacco. He reports that he does not drink alcohol or use drugs.  Former smoker  Allergies: No Known Allergies  Past Medical History HTN Hypercholesterolemia DM  Medication History gabapentin 300 mg capsule glipiZIDE 5 mg tablet HYDROcodone 5 mg-acetaminophen 325 mg tablet ibuprofen 800 mg tablet Lantus Solostar U-100 Insulin 100 unit/mL (3 mL) subcutaneous pen losartan 100 mg tablet metFORMIN 1,000 mg tablet metoprolol tartrate 25 mg tablet omega-3 acid ethyl esters 1 gram capsule simvastatin 20 mg tablet  Review of Systems  Constitutional: Negative.   HENT: Negative.   Eyes: Negative.   Respiratory: Negative.   Cardiovascular: Negative.   Gastrointestinal: Negative.   Genitourinary: Negative.   Musculoskeletal: Positive  for back pain.  Skin: Negative.   Neurological: Positive for sensory change and focal weakness.  Psychiatric/Behavioral: Negative.     There were no vitals taken for this visit. Physical Exam  Constitutional: He is oriented to person, place, and time. He appears well-developed.  HENT:  Head: Normocephalic.  Eyes: Pupils are equal, round, and reactive to light.  Neck: Normal range of motion.  Cardiovascular: Normal rate.  Respiratory: Effort normal.  GI: Soft.  Musculoskeletal:  Patient is a 67 year old male.  AAOx3. Well  nourished, well developed. Seated on exam in no acute distress. He ambulates with forward flexion in the lumbar spine but no assistive devices.  Nontender to palpation on the lumbar spinous processes, paraspinous musculature, greater trochanters of the hips. Mildly tender upper outer buttock bilaterally. Positive straight leg raise bilaterally reproducing buttock and leg pain, left more severe than right. Significantly decreased extension Lspine, moderately decreased flexion. No weakness through the lower extremities, 5/5 with hip flexors, quads, hamstrings, plantar and dorsiflexion, EHL. No calf pain or sign of DVT. Decreased sensation in the feet specifically in the L5 dermatomal distribution.  Neurological: He is alert and oriented to person, place, and time.  Skin: Skin is warm and dry.    Prior xrays and MRI reviewed again today by Dr. 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 There is associated foraminal narrowing at L4-5 due to this.  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 Discussed proceeding with surgery as his symptoms have worsened. After review of his xrays and MRI in detail by Dr. Tonita Cong would again recommend proceeding with microlumbar decompression L3-4 and L4-5 with possible lateral mass fusion L3-4 and L4-5 with autograft and allograft bone graft. The initial plan to utilize coflex has been denied by his insurance provider. We discussed the procedure itself as well as risks, complications and alternatives including but not limited to DVT, PE, failure of procedure, need for secondary procedure, anesthesia risk, even death. Discussed post-op protocols, need for PT, walking, time out of work up to 12 weeks given his line of work, note  for work given. Discussed a short term disability policy through work as well. He has already been cleared and would like to proceed ASAP due to his level of pain. We will proceed accordingly with scheduling. Pt was seen in conjunction with Dr. Tonita Cong today.  Plan microlumbar decompression L3-4, L4-5 with possible lateral mass fusion L3-4, L4-5 with autograft and allograft bone graft.  Cecilie Kicks., PA-C for Dr. Tonita Cong

## 2017-10-28 MED FILL — LANTUS SOLOSTAR 100 UNITS/M: 100 | 30 days supply | Qty: 30 | Fill #8

## 2017-10-28 MED FILL — glipiZIDE 5 MG TABS: 5 | 90 days supply | Qty: 90 | Fill #2

## 2017-10-29 NOTE — Anesthesia Preprocedure Evaluation (Addendum)
Anesthesia Evaluation  Patient identified by MRN, date of birth, ID band Patient awake    Reviewed: Allergy & Precautions, NPO status , Patient's Chart, lab work & pertinent test results, reviewed documented beta blocker date and time   Airway Mallampati: II  TM Distance: >3 FB Neck ROM: Full    Dental  (+) Dental Advisory Given   Pulmonary former smoker,    breath sounds clear to auscultation       Cardiovascular hypertension, Pt. on medications and Pt. on home beta blockers  Rhythm:Regular Rate:Normal     Neuro/Psych negative neurological ROS     GI/Hepatic negative GI ROS, (+)     substance abuse (hx of heroin abuse)  , Hepatitis -, C  Endo/Other  diabetes, Type 2, Oral Hypoglycemic Agents  Renal/GU negative Renal ROS     Musculoskeletal  (+) Arthritis ,   Abdominal   Peds  Hematology  (+) anemia ,   Anesthesia Other Findings   Reproductive/Obstetrics                            Lab Results  Component Value Date   WBC 9.8 10/22/2017   HGB 12.2 (L) 10/22/2017   HCT 38.5 (L) 10/22/2017   MCV 85.2 10/22/2017   PLT 306 10/22/2017   Lab Results  Component Value Date   CREATININE 1.01 10/22/2017   BUN 15 10/22/2017   NA 140 10/22/2017   K 4.4 10/22/2017   CL 106 10/22/2017   CO2 24 10/22/2017    Anesthesia Physical Anesthesia Plan  ASA: II  Anesthesia Plan: General   Post-op Pain Management:    Induction: Intravenous  PONV Risk Score and Plan: 2 and Dexamethasone, Ondansetron and Treatment may vary due to age or medical condition  Airway Management Planned: Oral ETT  Additional Equipment:   Intra-op Plan:   Post-operative Plan: Extubation in OR  Informed Consent: I have reviewed the patients History and Physical, chart, labs and discussed the procedure including the risks, benefits and alternatives for the proposed anesthesia with the patient or authorized  representative who has indicated his/her understanding and acceptance.   Dental advisory given  Plan Discussed with: CRNA  Anesthesia Plan Comments:        Anesthesia Quick Evaluation

## 2017-10-30 ENCOUNTER — Ambulatory Visit (HOSPITAL_COMMUNITY): Payer: BLUE CROSS/BLUE SHIELD

## 2017-10-30 ENCOUNTER — Ambulatory Visit (HOSPITAL_COMMUNITY): Payer: BLUE CROSS/BLUE SHIELD | Admitting: Anesthesiology

## 2017-10-30 ENCOUNTER — Encounter (HOSPITAL_COMMUNITY): Payer: Self-pay | Admitting: *Deleted

## 2017-10-30 ENCOUNTER — Ambulatory Visit (HOSPITAL_COMMUNITY)
Admission: RE | Disposition: A | Discharge status: Home / Self Care | Payer: Self-pay | Source: Ambulatory Visit | Attending: Specialist

## 2017-10-30 ENCOUNTER — Ambulatory Visit (HOSPITAL_COMMUNITY)
Admission: RE | Admit: 2017-10-30 | Discharge: 2017-10-31 | Disposition: A | Discharge status: Home / Self Care | Payer: BLUE CROSS/BLUE SHIELD | Source: Ambulatory Visit | Attending: Specialist | Admitting: Specialist

## 2017-10-30 ENCOUNTER — Other Ambulatory Visit: Payer: Self-pay

## 2017-10-30 DIAGNOSIS — I10 Essential (primary) hypertension: Secondary | ICD-10-CM | POA: Diagnosis not present

## 2017-10-30 DIAGNOSIS — Z794 Long term (current) use of insulin: Secondary | ICD-10-CM | POA: Insufficient documentation

## 2017-10-30 DIAGNOSIS — E7800 Pure hypercholesterolemia, unspecified: Secondary | ICD-10-CM | POA: Diagnosis not present

## 2017-10-30 DIAGNOSIS — M5136 Other intervertebral disc degeneration, lumbar region: Secondary | ICD-10-CM | POA: Diagnosis not present

## 2017-10-30 DIAGNOSIS — Z791 Long term (current) use of non-steroidal anti-inflammatories (NSAID): Secondary | ICD-10-CM | POA: Insufficient documentation

## 2017-10-30 DIAGNOSIS — Z419 Encounter for procedure for purposes other than remedying health state, unspecified: Secondary | ICD-10-CM

## 2017-10-30 DIAGNOSIS — E1142 Type 2 diabetes mellitus with diabetic polyneuropathy: Secondary | ICD-10-CM | POA: Insufficient documentation

## 2017-10-30 DIAGNOSIS — Z7982 Long term (current) use of aspirin: Secondary | ICD-10-CM | POA: Insufficient documentation

## 2017-10-30 DIAGNOSIS — E785 Hyperlipidemia, unspecified: Secondary | ICD-10-CM | POA: Insufficient documentation

## 2017-10-30 DIAGNOSIS — M48061 Spinal stenosis, lumbar region without neurogenic claudication: Secondary | ICD-10-CM | POA: Diagnosis present

## 2017-10-30 DIAGNOSIS — Z79899 Other long term (current) drug therapy: Secondary | ICD-10-CM | POA: Diagnosis not present

## 2017-10-30 DIAGNOSIS — Z981 Arthrodesis status: Secondary | ICD-10-CM | POA: Diagnosis not present

## 2017-10-30 DIAGNOSIS — M1990 Unspecified osteoarthritis, unspecified site: Secondary | ICD-10-CM | POA: Insufficient documentation

## 2017-10-30 DIAGNOSIS — Z87891 Personal history of nicotine dependence: Secondary | ICD-10-CM | POA: Insufficient documentation

## 2017-10-30 DIAGNOSIS — M48062 Spinal stenosis, lumbar region with neurogenic claudication: Secondary | ICD-10-CM | POA: Diagnosis not present

## 2017-10-30 HISTORY — PX: LUMBAR LAMINECTOMY/DECOMPRESSION MICRODISCECTOMY: SHX5026

## 2017-10-30 LAB — GLUCOSE, CAPILLARY
Glucose-Capillary: 156 mg/dL — ABNORMAL HIGH (ref 65–99)
Glucose-Capillary: 174 mg/dL — ABNORMAL HIGH (ref 65–99)
Glucose-Capillary: 204 mg/dL — ABNORMAL HIGH (ref 65–99)
Glucose-Capillary: 318 mg/dL — ABNORMAL HIGH (ref 65–99)
Glucose-Capillary: 253 mg/dL — ABNORMAL HIGH (ref 65–99)

## 2017-10-30 SURGERY — LUMBAR LAMINECTOMY/DECOMPRESSION MICRODISCECTOMY 2 LEVELS
Anesthesia: General | Site: Spine Lumbar

## 2017-10-30 MED ORDER — ACETAMINOPHEN 10 MG/ML IV SOLN
1000.0000 mg | Freq: Four times a day (QID) | INTRAVENOUS | Status: AC
  Administered 2017-10-30: 1000 mg via INTRAVENOUS
  Filled 2017-10-30: qty 100

## 2017-10-30 MED ORDER — NEOSTIGMINE METHYLSULFATE 10 MG/10ML IV SOLN
INTRAVENOUS | Status: DC | PRN
  Administered 2017-10-30: 3 mg via INTRAVENOUS

## 2017-10-30 MED ORDER — POLYETHYLENE GLYCOL 3350 17 G PO PACK: 17.0000 g | PACK | Freq: Every day | ORAL | Status: DC | PRN

## 2017-10-30 MED ORDER — BUPIVACAINE-EPINEPHRINE 0.5% -1:200000 IJ SOLN
INTRAMUSCULAR | Status: DC | PRN
  Administered 2017-10-30: 10 mL

## 2017-10-30 MED ORDER — GLYCOPYRROLATE 0.2 MG/ML IJ SOLN
INTRAMUSCULAR | Status: DC | PRN
  Administered 2017-10-30: 0.4 mg via INTRAVENOUS

## 2017-10-30 MED ORDER — PHENOL 1.4 % MT LIQD: 1.0000 | OROMUCOSAL | Status: DC | PRN

## 2017-10-30 MED ORDER — BISACODYL 5 MG PO TBEC: 5.0000 mg | DELAYED_RELEASE_TABLET | Freq: Every day | ORAL | Status: DC | PRN

## 2017-10-30 MED ORDER — MAGNESIUM CITRATE PO SOLN: 1.0000 | Freq: Once | ORAL | Status: DC | PRN

## 2017-10-30 MED ORDER — DOCUSATE SODIUM 100 MG PO CAPS
100.0000 mg | ORAL_CAPSULE | Freq: Two times a day (BID) | ORAL | Status: DC
  Administered 2017-10-30 – 2017-10-31 (×2): 100 mg via ORAL
  Filled 2017-10-30 (×2): qty 1

## 2017-10-30 MED ORDER — METOPROLOL SUCCINATE ER 25 MG PO TB24
25.0000 mg | ORAL_TABLET | Freq: Every day | ORAL | Status: DC
  Administered 2017-10-31: 25 mg via ORAL
  Filled 2017-10-30: qty 1

## 2017-10-30 MED ORDER — ARTIFICIAL TEARS OPHTHALMIC OINT
TOPICAL_OINTMENT | OPHTHALMIC | Status: DC | PRN
  Administered 2017-10-30: 1 via OPHTHALMIC

## 2017-10-30 MED ORDER — ACETAMINOPHEN 325 MG PO TABS
650.0000 mg | ORAL_TABLET | ORAL | Status: DC | PRN
  Administered 2017-10-31: 650 mg via ORAL
  Filled 2017-10-30: qty 2

## 2017-10-30 MED ORDER — POLYETHYLENE GLYCOL 3350 17 G PO PACK: 17.0000 g | PACK | Freq: Every day | ORAL | 0 refills | Status: DC

## 2017-10-30 MED ORDER — IBUPROFEN 800 MG PO TABS: 800.0000 mg | ORAL_TABLET | Freq: Three times a day (TID) | ORAL | 0 refills | Status: DC | PRN

## 2017-10-30 MED ORDER — LIDOCAINE HCL (CARDIAC) 20 MG/ML IV SOLN
INTRAVENOUS | Status: DC | PRN
  Administered 2017-10-30: 80 mg via INTRAVENOUS

## 2017-10-30 MED ORDER — ROCURONIUM BROMIDE 10 MG/ML (PF) SYRINGE
PREFILLED_SYRINGE | INTRAVENOUS | Status: AC
  Filled 2017-10-30: qty 5

## 2017-10-30 MED ORDER — OXYCODONE-ACETAMINOPHEN 5-325 MG PO TABS: 1.0000 | ORAL_TABLET | ORAL | 0 refills | Status: DC | PRN

## 2017-10-30 MED ORDER — PHENYLEPHRINE HCL 10 MG/ML IJ SOLN
INTRAVENOUS | Status: DC | PRN
  Administered 2017-10-30: 25 ug/min via INTRAVENOUS

## 2017-10-30 MED ORDER — ALUM & MAG HYDROXIDE-SIMETH 200-200-20 MG/5ML PO SUSP: 30.0000 mL | Freq: Four times a day (QID) | ORAL | Status: DC | PRN

## 2017-10-30 MED ORDER — MIDAZOLAM HCL 2 MG/2ML IJ SOLN
INTRAMUSCULAR | Status: AC
  Filled 2017-10-30: qty 2

## 2017-10-30 MED ORDER — ONDANSETRON HCL 4 MG/2ML IJ SOLN: 4.0000 mg | Freq: Four times a day (QID) | INTRAMUSCULAR | Status: DC | PRN

## 2017-10-30 MED ORDER — METHOCARBAMOL 500 MG PO TABS
ORAL_TABLET | ORAL | Status: AC
  Filled 2017-10-30: qty 1

## 2017-10-30 MED ORDER — 0.9 % SODIUM CHLORIDE (POUR BTL) OPTIME
TOPICAL | Status: DC | PRN
  Administered 2017-10-30: 1000 mL

## 2017-10-30 MED ORDER — POLYVINYL ALCOHOL 1.4 % OP SOLN
1.0000 [drp] | Freq: Three times a day (TID) | OPHTHALMIC | Status: DC | PRN
  Filled 2017-10-30: qty 15

## 2017-10-30 MED ORDER — LOSARTAN POTASSIUM 50 MG PO TABS
100.0000 mg | ORAL_TABLET | Freq: Every day | ORAL | Status: DC
  Administered 2017-10-30 – 2017-10-31 (×2): 100 mg via ORAL
  Filled 2017-10-30 (×2): qty 2

## 2017-10-30 MED ORDER — POTASSIUM CHLORIDE IN NACL 20-0.45 MEQ/L-% IV SOLN
INTRAVENOUS | Status: DC
  Filled 2017-10-30: qty 1000

## 2017-10-30 MED ORDER — MENTHOL 3 MG MT LOZG: 1.0000 | LOZENGE | OROMUCOSAL | Status: DC | PRN

## 2017-10-30 MED ORDER — INSULIN GLARGINE 100 UNIT/ML ~~LOC~~ SOLN
80.0000 [IU] | Freq: Every morning | SUBCUTANEOUS | Status: DC
  Administered 2017-10-31: 80 [IU] via SUBCUTANEOUS
  Filled 2017-10-30: qty 0.8

## 2017-10-30 MED ORDER — GABAPENTIN 300 MG PO CAPS
300.0000 mg | ORAL_CAPSULE | Freq: Every evening | ORAL | Status: DC | PRN
  Administered 2017-10-30: 300 mg via ORAL
  Filled 2017-10-30: qty 1

## 2017-10-30 MED ORDER — FENTANYL CITRATE (PF) 250 MCG/5ML IJ SOLN
INTRAMUSCULAR | Status: AC
  Filled 2017-10-30: qty 5

## 2017-10-30 MED ORDER — METHOCARBAMOL 1000 MG/10ML IJ SOLN
500.0000 mg | Freq: Four times a day (QID) | INTRAMUSCULAR | Status: DC | PRN
  Filled 2017-10-30: qty 5

## 2017-10-30 MED ORDER — SODIUM CHLORIDE 0.9 % IR SOLN
Status: DC | PRN
  Administered 2017-10-30: 08:00:00

## 2017-10-30 MED ORDER — HYDROMORPHONE HCL 1 MG/ML IJ SOLN: 1.0000 mg | INTRAMUSCULAR | Status: DC | PRN

## 2017-10-30 MED ORDER — CEFAZOLIN SODIUM-DEXTROSE 2-4 GM/100ML-% IV SOLN
2.0000 g | INTRAVENOUS | Status: AC
  Administered 2017-10-30: 2 g via INTRAVENOUS

## 2017-10-30 MED ORDER — INSULIN ASPART 100 UNIT/ML ~~LOC~~ SOLN
0.0000 [IU] | Freq: Three times a day (TID) | SUBCUTANEOUS | Status: DC
  Administered 2017-10-31: 11 [IU] via SUBCUTANEOUS

## 2017-10-30 MED ORDER — ASPIRIN 81 MG PO TABS: 81.0000 mg | ORAL_TABLET | Freq: Every day | ORAL | Status: AC

## 2017-10-30 MED ORDER — ROCURONIUM BROMIDE 100 MG/10ML IV SOLN
INTRAVENOUS | Status: DC | PRN
  Administered 2017-10-30: 50 mg via INTRAVENOUS

## 2017-10-30 MED ORDER — ACETAMINOPHEN 10 MG/ML IV SOLN
1000.0000 mg | INTRAVENOUS | Status: AC
  Administered 2017-10-30: 1000 mg via INTRAVENOUS

## 2017-10-30 MED ORDER — INSULIN ASPART 100 UNIT/ML ~~LOC~~ SOLN
0.0000 [IU] | Freq: Every day | SUBCUTANEOUS | Status: DC
Start: 2017-10-30 — End: 2017-10-31
  Administered 2017-10-30: 3 [IU] via SUBCUTANEOUS

## 2017-10-30 MED ORDER — ONDANSETRON HCL 4 MG/2ML IJ SOLN
INTRAMUSCULAR | Status: AC
  Filled 2017-10-30: qty 4

## 2017-10-30 MED ORDER — THROMBIN 20000 UNITS EX SOLR
CUTANEOUS | Status: AC
  Filled 2017-10-30: qty 20000

## 2017-10-30 MED ORDER — OXYCODONE HCL 5 MG PO TABS
ORAL_TABLET | ORAL | Status: AC
  Filled 2017-10-30: qty 1

## 2017-10-30 MED ORDER — FENTANYL CITRATE (PF) 100 MCG/2ML IJ SOLN
INTRAMUSCULAR | Status: DC | PRN
  Administered 2017-10-30 (×2): 50 ug via INTRAVENOUS
  Administered 2017-10-30: 100 ug via INTRAVENOUS
  Administered 2017-10-30: 50 ug via INTRAVENOUS

## 2017-10-30 MED ORDER — MIDAZOLAM HCL 5 MG/5ML IJ SOLN
INTRAMUSCULAR | Status: DC | PRN
  Administered 2017-10-30: 2 mg via INTRAVENOUS

## 2017-10-30 MED ORDER — ACETAMINOPHEN 10 MG/ML IV SOLN
INTRAVENOUS | Status: AC
  Filled 2017-10-30: qty 100

## 2017-10-30 MED ORDER — DEXAMETHASONE SODIUM PHOSPHATE 10 MG/ML IJ SOLN
INTRAMUSCULAR | Status: DC | PRN
  Administered 2017-10-30: 10 mg via INTRAVENOUS

## 2017-10-30 MED ORDER — LIDOCAINE HCL (CARDIAC) 20 MG/ML IV SOLN
INTRAVENOUS | Status: AC
  Filled 2017-10-30: qty 5

## 2017-10-30 MED ORDER — OXYCODONE HCL 5 MG PO TABS
10.0000 mg | ORAL_TABLET | ORAL | Status: DC | PRN
  Administered 2017-10-30 – 2017-10-31 (×7): 10 mg via ORAL
  Filled 2017-10-30 (×7): qty 2

## 2017-10-30 MED ORDER — ONDANSETRON HCL 4 MG/2ML IJ SOLN
INTRAMUSCULAR | Status: DC | PRN
  Administered 2017-10-30 (×2): 4 mg via INTRAVENOUS

## 2017-10-30 MED ORDER — PROPOFOL 10 MG/ML IV BOLUS
INTRAVENOUS | Status: DC | PRN
  Administered 2017-10-30: 200 mg via INTRAVENOUS

## 2017-10-30 MED ORDER — ACETAMINOPHEN 650 MG RE SUPP: 650.0000 mg | RECTAL | Status: DC | PRN

## 2017-10-30 MED ORDER — LACTATED RINGERS IV SOLN
INTRAVENOUS | Status: DC | PRN
  Administered 2017-10-30: 07:00:00 via INTRAVENOUS

## 2017-10-30 MED ORDER — THROMBIN (RECOMBINANT) 20000 UNITS EX SOLR
CUTANEOUS | Status: DC | PRN
  Administered 2017-10-30: 08:00:00 via TOPICAL

## 2017-10-30 MED ORDER — LACTATED RINGERS IV SOLN
INTRAVENOUS | Status: DC
  Administered 2017-10-30: 10:00:00 via INTRAVENOUS

## 2017-10-30 MED ORDER — DOCUSATE SODIUM 100 MG PO CAPS: 100.0000 mg | ORAL_CAPSULE | Freq: Two times a day (BID) | ORAL | 2 refills | Status: AC

## 2017-10-30 MED ORDER — METHOCARBAMOL 500 MG PO TABS
500.0000 mg | ORAL_TABLET | Freq: Four times a day (QID) | ORAL | Status: DC | PRN
  Administered 2017-10-30 – 2017-10-31 (×4): 500 mg via ORAL
  Filled 2017-10-30 (×3): qty 1

## 2017-10-30 MED ORDER — DEXAMETHASONE SODIUM PHOSPHATE 10 MG/ML IJ SOLN
INTRAMUSCULAR | Status: AC
  Filled 2017-10-30: qty 1

## 2017-10-30 MED ORDER — RISAQUAD PO CAPS
1.0000 | ORAL_CAPSULE | Freq: Every day | ORAL | Status: DC
  Administered 2017-10-31: 1 via ORAL
  Filled 2017-10-30 (×2): qty 1

## 2017-10-30 MED ORDER — OXYCODONE HCL 5 MG PO TABS
5.0000 mg | ORAL_TABLET | ORAL | Status: DC | PRN
  Administered 2017-10-30: 5 mg via ORAL

## 2017-10-30 MED ORDER — INSULIN ASPART 100 UNIT/ML ~~LOC~~ SOLN
0.0000 [IU] | Freq: Three times a day (TID) | SUBCUTANEOUS | Status: DC
  Administered 2017-10-30: 11 [IU] via SUBCUTANEOUS
  Administered 2017-10-30: 5 [IU] via SUBCUTANEOUS

## 2017-10-30 MED ORDER — CEFAZOLIN SODIUM-DEXTROSE 2-4 GM/100ML-% IV SOLN
2.0000 g | Freq: Three times a day (TID) | INTRAVENOUS | Status: AC
  Administered 2017-10-30 (×2): 2 g via INTRAVENOUS
  Filled 2017-10-30 (×2): qty 100

## 2017-10-30 MED ORDER — ONDANSETRON HCL 4 MG PO TABS
4.0000 mg | ORAL_TABLET | Freq: Four times a day (QID) | ORAL | Status: DC | PRN
Start: 2017-10-30 — End: 2017-10-31

## 2017-10-30 MED ORDER — EPHEDRINE SULFATE 50 MG/ML IJ SOLN
INTRAMUSCULAR | Status: DC | PRN
  Administered 2017-10-30: 5 mg via INTRAVENOUS
  Administered 2017-10-30: 10 mg via INTRAVENOUS

## 2017-10-30 MED ORDER — BUPIVACAINE-EPINEPHRINE (PF) 0.5% -1:200000 IJ SOLN
INTRAMUSCULAR | Status: AC
  Filled 2017-10-30: qty 30

## 2017-10-30 SURGICAL SUPPLY — 48 items
BAG DECANTER FOR FLEXI CONT (MISCELLANEOUS) ×2 IMPLANT
BLADE 10 SAFETY STRL DISP (BLADE) ×1 IMPLANT
CLOTH 2% CHLOROHEXIDINE 3PK (PERSONAL CARE ITEMS) ×2 IMPLANT
DRAPE LAPAROTOMY 100X72X124 (DRAPES) ×2 IMPLANT
DRAPE MICROSCOPE LEICA (MISCELLANEOUS) ×2 IMPLANT
DRAPE SHEET LG 3/4 BI-LAMINATE (DRAPES) ×2 IMPLANT
DRAPE SURG 17X11 SM STRL (DRAPES) ×2 IMPLANT
DRAPE UTILITY XL STRL (DRAPES) ×2 IMPLANT
DRSG AQUACEL AG ADV 3.5X 4 (GAUZE/BANDAGES/DRESSINGS) IMPLANT
DRSG AQUACEL AG ADV 3.5X 6 (GAUZE/BANDAGES/DRESSINGS) ×1 IMPLANT
DRSG TELFA 3X8 NADH (GAUZE/BANDAGES/DRESSINGS) IMPLANT
DURAPREP 26ML APPLICATOR (WOUND CARE) ×2 IMPLANT
DURASEAL SPINE SEALANT 3ML (MISCELLANEOUS) IMPLANT
ELECT BLADE 4.0 EZ CLEAN MEGAD (MISCELLANEOUS) ×2
ELECTRODE BLDE 4.0 EZ CLN MEGD (MISCELLANEOUS) IMPLANT
GLOVE BIOGEL PI IND STRL 7.0 (GLOVE) ×1 IMPLANT
GLOVE BIOGEL PI IND STRL 7.5 (GLOVE) IMPLANT
GLOVE BIOGEL PI INDICATOR 7.0 (GLOVE) ×2
GLOVE BIOGEL PI INDICATOR 7.5 (GLOVE) ×1
GLOVE SURG SS PI 7.5 STRL IVOR (GLOVE) ×2 IMPLANT
GLOVE SURG SS PI 8.0 STRL IVOR (GLOVE) ×4 IMPLANT
GOWN STRL REUS W/ TWL LRG LVL3 (GOWN DISPOSABLE) ×1 IMPLANT
GOWN STRL REUS W/ TWL XL LVL3 (GOWN DISPOSABLE) ×1 IMPLANT
GOWN STRL REUS W/TWL LRG LVL3 (GOWN DISPOSABLE) ×4
GOWN STRL REUS W/TWL XL LVL3 (GOWN DISPOSABLE) ×2
IV CATH 14GX2 1/4 (CATHETERS) ×2 IMPLANT
KIT BASIN OR (CUSTOM PROCEDURE TRAY) ×2 IMPLANT
NDL SPNL 18GX3.5 QUINCKE PK (NEEDLE) ×3 IMPLANT
NEEDLE 22X1 1/2 (OR ONLY) (NEEDLE) ×2 IMPLANT
NEEDLE SPNL 18GX3.5 QUINCKE PK (NEEDLE) ×8 IMPLANT
PACK LAMINECTOMY NEURO (CUSTOM PROCEDURE TRAY) ×2 IMPLANT
PAD DRESSING TELFA 3X8 NADH (GAUZE/BANDAGES/DRESSINGS) IMPLANT
PATTIES SURGICAL .75X.75 (GAUZE/BANDAGES/DRESSINGS) ×1 IMPLANT
RUBBERBAND STERILE (MISCELLANEOUS) ×4 IMPLANT
SPONGE LAP 4X18 X RAY DECT (DISPOSABLE) IMPLANT
SPONGE SURGIFOAM ABS GEL 100 (HEMOSTASIS) ×2 IMPLANT
STAPLER VISISTAT (STAPLE) ×1 IMPLANT
STRIP CLOSURE SKIN 1/2X4 (GAUZE/BANDAGES/DRESSINGS) ×2 IMPLANT
SUT NURALON 4 0 TR CR/8 (SUTURE) IMPLANT
SUT PROLENE 3 0 PS 2 (SUTURE) ×1 IMPLANT
SUT VIC AB 1 CT1 27 (SUTURE) ×2
SUT VIC AB 1 CT1 27XBRD ANBCTR (SUTURE) IMPLANT
SUT VIC AB 1-0 CT2 27 (SUTURE) ×2 IMPLANT
SUT VIC AB 2-0 CT2 27 (SUTURE) ×2 IMPLANT
SYR 3ML LL SCALE MARK (SYRINGE) ×2 IMPLANT
TOWEL GREEN STERILE (TOWEL DISPOSABLE) ×2 IMPLANT
TOWEL GREEN STERILE FF (TOWEL DISPOSABLE) ×2 IMPLANT
YANKAUER SUCT BULB TIP NO VENT (SUCTIONS) ×2 IMPLANT

## 2017-10-30 NOTE — Brief Op Note (Signed)
10/30/2017  10:04 AM  PATIENT:  Timothy Peters  67 y.o. male  PRE-OPERATIVE DIAGNOSIS:  Spinal Stenosis  POST-OPERATIVE DIAGNOSIS:  Spinal Stenosis  PROCEDURE:  Procedure(s) with comments: Microlumbar Decompression Bilateral Lumbar Three- Four, Lumbar Four-Five (N/A) - 150 mins  SURGEON:  Surgeon(s) and Role:    Susa Day, MD - Primary  PHYSICIAN ASSISTANT:   ASSISTANTS: Biss[place]   ANESTHESIA:   general  EBL:  150 mL   BLOOD ADMINISTERED:none  DRAINS: none   LOCAL MEDICATIONS USED:  MARCAINE     SPECIMEN:  No Specimen  DISPOSITION OF SPECIMEN:  N/A  COUNTS:  YES  TOURNIQUET:  * No tourniquets in log *  DICTATION: .Other Dictation: Dictation Number  [id number]  PLAN OF CARE: Admit for overnight observation  PATIENT DISPOSITION:  PACU - hemodynamically stable.   Delay start of Pharmacological VTE agent (>24hrs) due to surgical blood loss or risk of bleeding: yes

## 2017-10-30 NOTE — Transfer of Care (Signed)
Immediate Anesthesia Transfer of Care Note  Patient: Timothy Peters  Procedure(s) Performed: Microlumbar Decompression Bilateral Lumbar Three- Four, Lumbar Four-Five (N/A Spine Lumbar)  Patient Location: PACU  Anesthesia Type:General  Level of Consciousness: awake, alert , oriented and patient cooperative  Airway & Oxygen Therapy: Patient Spontanous Breathing and Patient connected to nasal cannula oxygen  Post-op Assessment: Report given to RN and Post -op Vital signs reviewed and stable  Post vital signs: Reviewed and stable  Last Vitals:  Vitals:   10/30/17 0545  BP: (!) 164/93  Pulse: 75  Resp: 20  Temp: 36.8 C  SpO2: 98%    Last Pain:  Vitals:   10/30/17 0556  TempSrc:   PainSc: 5       Patients Stated Pain Goal: 3 (23/76/28 3151)  Complications: No apparent anesthesia complications

## 2017-10-30 NOTE — Evaluation (Signed)
Physical Therapy Evaluation Patient Details Name: Timothy Peters MRN: [id number] DOB: [birth year] Today's Date: 10/30/2017   History of Present Illness  Pt is a 67 y/o male s/p L3-5 decompression. PMH includes DM and HTN.   Clinical Impression  Patient is s/p above surgery resulting in the deficits listed below (see PT Problem List). Pt tolerated gait well, however, was slightly unsteady requiring min guard A. Educated about back precautions and walking program.  Patient will benefit from skilled PT to increase their independence and safety with mobility (while adhering to their precautions) to allow discharge to the venue listed below.     Follow Up Recommendations No PT follow up;Supervision for mobility/OOB    Equipment Recommendations  None recommended by PT    Recommendations for Other Services       Precautions / Restrictions Precautions Precautions: Back Precaution Booklet Issued: Yes (comment) Precaution Comments: Reviewed back precautions with pt.  Restrictions Weight Bearing Restrictions: No      Mobility  Bed Mobility Overal bed mobility: Needs Assistance Bed Mobility: Rolling;Sidelying to Sit Rolling: Supervision Sidelying to sit: Supervision       General bed mobility comments: Supervision to ensure log roll technique. Verbal cues for sequencing. Pt requiring use of bed rails.   Transfers Overall transfer level: Needs assistance Equipment used: None Transfers: Sit to/from Stand Sit to Stand: Min guard         General transfer comment: Min guard for safety.   Ambulation/Gait Ambulation/Gait assistance: Min guard Ambulation Distance (Feet): 200 Feet Assistive device: None Gait Pattern/deviations: Step-through pattern;Decreased stride length Gait velocity: Decreased  Gait velocity interpretation: Below normal speed for age/gender General Gait Details: Slow, guarded, slightly unsteady gait. Min guard for steadying assist throughout. Educated about  generalized walking program to perform at home.   Stairs            Wheelchair Mobility    Modified Rankin (Stroke Patients Only)       Balance Overall balance assessment: Needs assistance Sitting-balance support: No upper extremity supported;Feet supported Sitting balance-Leahy Scale: Good     Standing balance support: During functional activity;No upper extremity supported Standing balance-Leahy Scale: Fair                               Pertinent Vitals/Pain Pain Assessment: Faces Faces Pain Scale: Hurts little more Pain Location: back  Pain Descriptors / Indicators: Aching;Operative site guarding Pain Intervention(s): Limited activity within patient's tolerance;Monitored during session;Repositioned    Home Living Family/patient expects to be discharged to:: Private residence Living Arrangements: Spouse/significant other;Children Available Help at Discharge: Family;Available 24 hours/day Type of Home: House Home Access: Stairs to enter Entrance Stairs-Rails: Right Entrance Stairs-Number of Steps: 1 Home Layout: One level Home Equipment: Walker - 2 wheels      Prior Function Level of Independence: Independent               Hand Dominance        Extremity/Trunk Assessment   Upper Extremity Assessment Upper Extremity Assessment: Defer to OT evaluation    Lower Extremity Assessment Lower Extremity Assessment: Generalized weakness(Reports pain is better )    Cervical / Trunk Assessment Cervical / Trunk Assessment: Other exceptions Cervical / Trunk Exceptions: s/p lumbar surgery.   Communication   Communication: No difficulties  Cognition Arousal/Alertness: Awake/alert Behavior During Therapy: WFL for tasks assessed/performed Overall Cognitive Status: Within Functional Limits for tasks assessed  General Comments General comments (skin integrity, edema, etc.): Pt's friend  present during session. Educated and practiced dressing while maintaining precautions.     Exercises     Assessment/Plan    PT Assessment Patient needs continued PT services  PT Problem List Decreased strength;Decreased balance;Decreased mobility;Decreased knowledge of precautions;Pain       PT Treatment Interventions Gait training;Stair training;Functional mobility training;Therapeutic activities;Therapeutic exercise;Balance training;Neuromuscular re-education;Patient/family education    PT Goals (Current goals can be found in the Care Plan section)  Acute Rehab PT Goals Patient Stated Goal: to go home  PT Goal Formulation: With patient Time For Goal Achievement: 11/13/17 Potential to Achieve Goals: Good    Frequency Min 5X/week   Barriers to discharge        Co-evaluation               AM-PAC PT "6 Clicks" Daily Activity  Outcome Measure Difficulty turning over in bed (including adjusting bedclothes, sheets and blankets)?: A Little Difficulty moving from lying on back to sitting on the side of the bed? : A Little Difficulty sitting down on and standing up from a chair with arms (e.g., wheelchair, bedside commode, etc,.)?: Unable Help needed moving to and from a bed to chair (including a wheelchair)?: A Little Help needed walking in hospital room?: A Little Help needed climbing 3-5 steps with a railing? : A Little 6 Click Score: 16    End of Session Equipment Utilized During Treatment: Gait belt Activity Tolerance: Patient tolerated treatment well Patient left: in chair;with call bell/phone within reach;with family/visitor present Nurse Communication: Mobility status PT Visit Diagnosis: Other abnormalities of gait and mobility (R26.89);Unsteadiness on feet (R26.81);Pain Pain - part of body: (back )    Time: 2800-3491 PT Time Calculation (min) (ACUTE ONLY): 17 min   Charges:   PT Evaluation $PT Eval Low Complexity: 1 Low     PT G Codes:        Leighton Ruff, PT, DPT  Acute Rehabilitation Services  Pager: [phone]   Rudean Hitt 10/30/2017, 6:26 PM

## 2017-10-30 NOTE — Discharge Instructions (Signed)

## 2017-10-30 NOTE — Discharge Summary (Signed)
Physician Discharge Summary   Patient ID: Timothy Peters MRN: [id number] DOB/AGE: 1951-07-30 67 y.o.  Admit date: 10/30/2017 Discharge date: 10/31/2017 Primary Diagnosis:   Spinal Stenosis  Admission Diagnoses:  Past Medical History:  Diagnosis Date  . 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 Erectile dysfunction   . Gallstone 06/16   on CT, also present on U/S in Jan 2017  . Hepatitis    C  . Hyperlipidemia   . Hypertension   . Spinal stenosis    Discharge Diagnoses:   Principal Problem:   Spinal stenosis of lumbar region Active Problems:   Spinal stenosis at L4-L5 level  Procedure:  Procedure(s) (LRB): Microlumbar Decompression Bilateral Lumbar Three- Four, Lumbar Four-Five (N/A)   Consults: None  HPI:  see H&P    Laboratory Data: Hospital Outpatient Visit on 10/22/2017  Component Date Value Ref Range Status  . Glucose-Capillary 10/22/2017 140* 65 - 99 mg/dL Final  . WBC 10/22/2017 9.8  4.0 - 10.5 K/uL Final  . RBC 10/22/2017 4.52  4.22 - 5.81 MIL/uL Final  . Hemoglobin 10/22/2017 12.2* 13.0 - 17.0 g/dL Final  . HCT 10/22/2017 38.5* 39.0 - 52.0 % Final  . MCV 10/22/2017 85.2  78.0 - 100.0 fL Final  . MCH 10/22/2017 27.0  26.0 - 34.0 pg Final  . MCHC 10/22/2017 31.7  30.0 - 36.0 g/dL Final  . RDW 10/22/2017 13.5  11.5 - 15.5 % Final  . Platelets 10/22/2017 306  150 - 400 K/uL Final   Performed at [facility], [place] [address] [place], [place] [address]  . Hgb A1c MFr Bld 10/22/2017 7.6* 4.8 - 5.6 % Final   Comment: (NOTE) Pre diabetes:          5.7%-6.4% Diabetes:              >6.4% Glycemic control for   <7.0% adults with diabetes   . Mean Plasma Glucose 10/22/2017 171.42  mg/dL Final   Performed at [facility] [address], [place], [place] [address]  . Sodium 10/22/2017 140  135 - 145 mmol/L Final  . Potassium 10/22/2017 4.4  3.5 - 5.1 mmol/L Final  . Chloride 10/22/2017 106  101 - 111 mmol/L Final  . CO2  10/22/2017 24  22 - 32 mmol/L Final  . Glucose, Bld 10/22/2017 138* 65 - 99 mg/dL Final  . BUN 10/22/2017 15  6 - 20 mg/dL Final  . Creatinine, Ser 10/22/2017 1.01  0.61 - 1.24 mg/dL Final  . Calcium 10/22/2017 9.6  8.9 - 10.3 mg/dL Final  . Total Protein 10/22/2017 7.5  6.5 - 8.1 g/dL Final  . Albumin 10/22/2017 3.8  3.5 - 5.0 g/dL Final  . AST 10/22/2017 24  15 - 41 U/L Final  . ALT 10/22/2017 22  17 - 63 U/L Final  . Alkaline Phosphatase 10/22/2017 75  38 - 126 U/L Final  . Total Bilirubin 10/22/2017 0.6  0.3 - 1.2 mg/dL Final  . GFR calc non Af Amer 10/22/2017 >60  >60 mL/min Final  . GFR calc Af Amer 10/22/2017 >60  >60 mL/min Final   Comment: (NOTE) The eGFR has been calculated using the CKD EPI equation. This calculation has not been validated in all clinical situations. eGFR's persistently <60 mL/min signify possible Chronic Kidney Disease.   . Anion gap 10/22/2017 10  5 - 15 Final   Performed at [facility], [place] [address], [place], [place] [address]  .  MRSA, PCR 10/22/2017 NEGATIVE  NEGATIVE Final  . Staphylococcus aureus 10/22/2017 POSITIVE* NEGATIVE Final   Comment: (NOTE) The Xpert SA Assay (FDA approved for NASAL specimens in patients 41 years of age and older), is one component of a comprehensive surveillance program. It is not intended to diagnose infection nor to guide or monitor treatment. Performed at [facility], [place] [address] [place], [place] [address]    No results for input(s): HGB in the last 72 hours. No results for input(s): WBC, RBC, HCT, PLT in the last 72 hours. No results for input(s): NA, K, CL, CO2, BUN, CREATININE, GLUCOSE, CALCIUM in the last 72 hours. No results for input(s): LABPT, INR in the last 72 hours.  X-Rays:Dg Lumbar Spine 2-3 Views  Result Date: 10/30/2017 CLINICAL DATA:  Micro lumbar decompression at multiple levels. EXAM: LUMBAR SPINE - 2-3 VIEW COMPARISON:  Radiographs of October 22, 2017. FINDINGS: Three  intraoperative cross-table lateral projections of the lumbar spine were obtained. The first image demonstrates surgical probes directed toward the posterior interspinous spaces of L3-4 and L4-5. The second image demonstrates surgical retractor posterior to L4 and L5 vertebral bodies. Surgical probe is seen projected over the L3 and L5 posterior spinous processes. The third image demonstrates surgical probes positioned over the posterior elements of L3 and L5. IMPRESSION: Surgical localization as described above. Electronically Signed   By: Marijo Conception, M.D.   On: 10/30/2017 10:45   Dg Lumbar Spine 2-3 Views  Result Date: 10/23/2017 CLINICAL DATA:  Preop for lumbar surgery. EXAM: LUMBAR SPINE - 2-3 VIEW COMPARISON:  12/25/2015 FINDINGS: Five lumbar type vertebral bodies. Lower lumbar facet spurring with L4-5 grade 1 anterolisthesis. Mild disc narrowing at L4-5. No evidence of fracture or bone lesion. Calcification of the right flank reflects a gallstone based on 2016 abdominal CT. IMPRESSION: 1. Five lumbar type vertebral bodies. 2. Lower lumbar facet arthropathy with L4-5 anterolisthesis and mild disc narrowing. 3. Cholelithiasis. Electronically Signed   By: Monte Fantasia M.D.   On: 10/23/2017 09:02    EKG: Orders placed or performed during the hospital encounter of 10/22/17  . EKG 12 lead  . EKG 12 lead     Hospital Course: Patient was admitted to [facility] and taken to the OR and underwent the above state procedure without complications.  Patient tolerated the procedure well and was later transferred to the recovery room and then to the orthopaedic floor for postoperative care.  They were given PO and IV analgesics for pain control following their surgery.  They were given 24 hours of postoperative antibiotics.   PT was consulted postop to assist with mobility and transfers.  The patient was allowed to be WBAT with therapy and was taught back precautions. Discharge planning was  consulted to help with postop disposition and equipment needs.  Patient had a good night on the evening of surgery and started to get up OOB with therapy on day one. Patient was seen in rounds and was ready to go home on day one.  They were given discharge instructions and dressing directions.  They were instructed on when to follow up in the office with Dr. Tonita Cong.   Diet: Regular diet Activity:WBAT; Lspine precautions Follow-up:in 2 weeks Disposition - Home Discharged Condition: good    Allergies as of 10/30/2017   No Known Allergies     Medication List    STOP taking these medications   simvastatin 20 MG tablet Commonly known as:  ZOCOR  TAKE these medications   aspirin 81 MG tablet Take 1 tablet (81 mg total) by mouth daily. Resume 4 days post-op What changed:  additional instructions   docusate sodium 100 MG capsule Commonly known as:  COLACE Take 1 capsule (100 mg total) by mouth 2 (two) times daily.   gabapentin 300 MG capsule Commonly known as:  NEURONTIN Take 300 mg by mouth at bedtime as needed (for neuropathy or back pain.).   glipiZIDE 5 MG tablet Commonly known as:  GLUCOTROL Take 1 tablet daily with either lunch or dinner. If blood sugar less than 150, hold your dose. Patient now has BCBS What changed:    how much to take  how to take this  when to take this  additional instructions   glucose blood test strip Use as instructed   ibuprofen 800 MG tablet Commonly known as:  ADVIL,MOTRIN Take 1 tablet (800 mg total) by mouth every 8 (eight) hours as needed for moderate pain. Resume 5 days post-op as needed What changed:    when to take this  additional instructions   Insulin Glargine 100 UNIT/ML Solostar Pen Commonly known as:  LANTUS Inject 80 Units into the skin every morning. BCBS What changed:    how much to take  additional instructions   Insulin Pen Needle 31G X 5 MM Misc 1 Units by Does not apply route as directed.   LINIMENTS  EX Apply 1 application topically daily. Horse Liniments for pain.   losartan 100 MG tablet Commonly known as:  COZAAR Take 1 tablet (100 mg total) by mouth daily. BCBS   LUBRICANT EYE DROPS 0.4-0.3 % Soln Generic drug:  Polyethyl Glycol-Propyl Glycol Place 1-2 drops into both eyes 3 (three) times daily as needed (for dry/irritated eyes).   metFORMIN 1000 MG tablet Commonly known as:  GLUCOPHAGE Take 1 tablet (1,000 mg total) by mouth 2 (two) times daily with a meal. BCBS   metoprolol succinate 25 MG 24 hr tablet Commonly known as:  TOPROL-XL Take 25 mg by mouth daily.   multivitamin with minerals tablet Take 1 tablet by mouth daily. Adult multivitamin 50+   omega-3 acid ethyl esters 1 g capsule Commonly known as:  LOVAZA Take 2 capsules (2 g total) by mouth 2 (two) times daily. What changed:  how much to take   oxyCODONE-acetaminophen 5-325 MG tablet Commonly known as:  PERCOCET Take 1-2 tablets by mouth every 4 (four) hours as needed for severe pain.   polyethylene glycol packet Commonly known as:  MIRALAX / GLYCOLAX Take 17 g by mouth daily.      Follow-up Information    Susa Day, MD Follow up in 2 week(s).   Specialty:  Orthopedic Surgery Contact information: [address] [place] [address] [phone]           Signed: Lacie Draft, PA-C Orthopaedic Surgery 10/30/2017, 3:12 PM

## 2017-10-30 NOTE — Anesthesia Postprocedure Evaluation (Signed)
Anesthesia Post Note  Patient: Timothy Peters  Procedure(s) Performed: Microlumbar Decompression Bilateral Lumbar Three- Four, Lumbar Four-Five (N/A Spine Lumbar)     Patient location during evaluation: PACU Anesthesia Type: General Level of consciousness: awake and alert Pain management: pain level controlled Vital Signs Assessment: post-procedure vital signs reviewed and stable Respiratory status: spontaneous breathing, nonlabored ventilation, respiratory function stable and patient connected to nasal cannula oxygen Cardiovascular status: blood pressure returned to baseline and stable Postop Assessment: no apparent nausea or vomiting Anesthetic complications: no    Last Vitals:  Vitals:   10/30/17 1108 10/30/17 1134  BP: 123/82 (!) 151/92  Pulse: 65 64  Resp: 14 16  Temp: 36.7 C 36.7 C  SpO2: 94% 96%    Last Pain:  Vitals:   10/30/17 1135  TempSrc:   PainSc: 2                  Tiajuana Amass

## 2017-10-30 NOTE — Interval H&P Note (Signed)
History and Physical Interval Note:  10/30/2017 7:30 AM  Timothy Peters  has presented today for surgery, with the diagnosis of Spinal Stenosis  The various methods of treatment have been discussed with the patient and family. After consideration of risks, benefits and other options for treatment, the patient has consented to  Procedure(s) with comments: Microlumbar decompression L3-4, L4-5, possible lateral mass fusion L3-4, L4-5 with autograft allograft bone (N/A) - 150 mins as a surgical intervention .  The patient's history has been reviewed, patient examined, no change in status, stable for surgery.  I have reviewed the patient's chart and labs.  Questions were answered to the patient's satisfaction.   Unable to use Coflex device or fusion per insurance co.   Timothy Peters

## 2017-10-31 ENCOUNTER — Encounter (HOSPITAL_COMMUNITY): Payer: Self-pay | Admitting: Specialist

## 2017-10-31 DIAGNOSIS — E1142 Type 2 diabetes mellitus with diabetic polyneuropathy: Secondary | ICD-10-CM | POA: Diagnosis not present

## 2017-10-31 DIAGNOSIS — M48062 Spinal stenosis, lumbar region with neurogenic claudication: Secondary | ICD-10-CM | POA: Diagnosis not present

## 2017-10-31 DIAGNOSIS — Z87891 Personal history of nicotine dependence: Secondary | ICD-10-CM | POA: Diagnosis not present

## 2017-10-31 DIAGNOSIS — Z794 Long term (current) use of insulin: Secondary | ICD-10-CM | POA: Diagnosis not present

## 2017-10-31 DIAGNOSIS — Z791 Long term (current) use of non-steroidal anti-inflammatories (NSAID): Secondary | ICD-10-CM | POA: Diagnosis not present

## 2017-10-31 DIAGNOSIS — Z7982 Long term (current) use of aspirin: Secondary | ICD-10-CM | POA: Diagnosis not present

## 2017-10-31 DIAGNOSIS — E7800 Pure hypercholesterolemia, unspecified: Secondary | ICD-10-CM | POA: Diagnosis not present

## 2017-10-31 DIAGNOSIS — E785 Hyperlipidemia, unspecified: Secondary | ICD-10-CM | POA: Diagnosis not present

## 2017-10-31 DIAGNOSIS — Z79899 Other long term (current) drug therapy: Secondary | ICD-10-CM | POA: Diagnosis not present

## 2017-10-31 DIAGNOSIS — I10 Essential (primary) hypertension: Secondary | ICD-10-CM | POA: Diagnosis not present

## 2017-10-31 DIAGNOSIS — M1990 Unspecified osteoarthritis, unspecified site: Secondary | ICD-10-CM | POA: Diagnosis not present

## 2017-10-31 LAB — BASIC METABOLIC PANEL
Anion gap: 11 (ref 5–15)
BUN: 23 mg/dL — ABNORMAL HIGH (ref 6–20)
CO2: 21 mmol/L — ABNORMAL LOW (ref 22–32)
Calcium: 9.3 mg/dL (ref 8.9–10.3)
Chloride: 103 mmol/L (ref 101–111)
Creatinine, Ser: 1.11 mg/dL (ref 0.61–1.24)
GFR calc Af Amer: >60 mL/min (ref >60)
GFR calc non Af Amer: >60 mL/min (ref >60)
Glucose, Bld: 346 mg/dL — ABNORMAL HIGH (ref 65–99)
Potassium: 4.2 mmol/L (ref 3.5–5.1)
Sodium: 135 mmol/L (ref 135–145)

## 2017-10-31 LAB — CBC
HCT: 35.6 % — ABNORMAL LOW (ref 39.0–52.0)
Hemoglobin: 11.2 g/dL — ABNORMAL LOW (ref 13.0–17.0)
MCH: 26.8 pg (ref 26.0–34.0)
MCHC: 31.5 g/dL (ref 30.0–36.0)
MCV: 85.2 fL (ref 78.0–100.0)
Platelets: 298 10*3/uL (ref 150–400)
RBC: 4.18 MIL/uL — ABNORMAL LOW (ref 4.22–5.81)
RDW: 13.9 % (ref 11.5–15.5)
WBC: 11.8 10*3/uL — ABNORMAL HIGH (ref 4.0–10.5)

## 2017-10-31 LAB — GLUCOSE, CAPILLARY: Glucose-Capillary: 329 mg/dL — ABNORMAL HIGH (ref 65–99)

## 2017-10-31 MED FILL — OXYCODONE-ACETAMINOPHEN 5-3: 5-325 | 7 days supply | Qty: 40 | Fill #0

## 2017-10-31 MED FILL — Thrombin For Soln 20000 Unit: CUTANEOUS | Qty: 1 | Status: AC

## 2017-10-31 NOTE — Evaluation (Signed)
Occupational Therapy Evaluation and Discharge Patient Details Name: Timothy Peters MRN: [id number] DOB: 1950-09-23 Today's Date: 10/31/2017    History of Present Illness Pt is a 67 y/o male s/p L3-5 decompression. PMH includes DM and HTN.    Clinical Impression   Pt reports he was independent with ADL PTA. Currently pt supervision with ADL and functional mobility. All back, safety, and ADL education completed with pt. Pt planning to d/c home with 24/7 supervision from family. No further acute OT needs identified; signing off at this time. Please re-consult if needs change. Thank you for this referral.    Follow Up Recommendations  No OT follow up;Supervision - Intermittent    Equipment Recommendations  3 in 1 bedside commode    Recommendations for Other Services       Precautions / Restrictions Precautions Precautions: Back Precaution Booklet Issued: No Precaution Comments: Pt able to recall 3/3 back precautions at start of session. Restrictions Weight Bearing Restrictions: No      Mobility Bed Mobility               General bed mobility comments: Pt OOB in chair upon arrival.  Transfers Overall transfer level: Needs assistance Equipment used: None Transfers: Sit to/from Stand Sit to Stand: Supervision         General transfer comment: for safety, good technique    Balance Overall balance assessment: Needs assistance Sitting-balance support: No upper extremity supported;Feet supported Sitting balance-Leahy Scale: Good     Standing balance support: No upper extremity supported;During functional activity Standing balance-Leahy Scale: Good                             ADL either performed or assessed with clinical judgement   ADL Overall ADL's : Needs assistance/impaired Eating/Feeding: Independent   Grooming: Supervision/safety;Standing Grooming Details (indicate cue type and reason): Educated pt on use of 2 cups for oral care Upper Body  Bathing: Set up;Sitting   Lower Body Bathing: Supervison/ safety;Sit to/from stand   Upper Body Dressing : Set up;Sitting   Lower Body Dressing: Supervision/safety;Sit to/from stand Lower Body Dressing Details (indicate cue type and reason): Pt able to cross foot over opposite knee. Educated on compensatory strategies for LB ADL. Toilet Transfer: Supervision/safety;Ambulation Toilet Transfer Details (indicate cue type and reason): Trialed with standard height toilet; pt with difficulty performing. Educted on use of 3 in 1 for over toilet--pt agreeable to home use   Toileting - Clothing Manipulation Details (indicate cue type and reason): Educated on proper technique for peri care without twisting and use of wet wipes Tub/ Shower Transfer: Supervision/safety;Tub transfer;Ambulation Tub/Shower Transfer Details (indicate cue type and reason): Simulated tub transfer in room with supervision. Educated on use of 3 in 1 in tub as a seat if needed  Functional mobility during ADLs: Supervision/safety General ADL Comments: Educated pt on maintaining back precautions duirng functional activities, keeping frequently used items at counter top height, frequent mobility throughout the day upon return home, log roll for bed mobility.     Vision         Perception     Praxis      Pertinent Vitals/Pain Pain Assessment: Faces Faces Pain Scale: Hurts a little bit Pain Location: back Pain Descriptors / Indicators: Discomfort;Sore Pain Intervention(s): Monitored during session     Hand Dominance     Extremity/Trunk Assessment Upper Extremity Assessment Upper Extremity Assessment: Overall WFL for tasks assessed   Lower Extremity  Assessment Lower Extremity Assessment: Defer to PT evaluation   Cervical / Trunk Assessment Cervical / Trunk Assessment: Other exceptions Cervical / Trunk Exceptions: s/p lumbar surgery.    Communication Communication Communication: No difficulties   Cognition  Arousal/Alertness: Awake/alert Behavior During Therapy: WFL for tasks assessed/performed Overall Cognitive Status: Within Functional Limits for tasks assessed                                     General Comments       Exercises     Shoulder Instructions      Home Living Family/patient expects to be discharged to:: Private residence Living Arrangements: Spouse/significant other;Children Available Help at Discharge: Family;Available 24 hours/day Type of Home: House Home Access: Stairs to enter [employer] of Steps: 1 Entrance Stairs-Rails: Right Home Layout: One level     Bathroom Shower/Tub: Teacher, early years/pre: Standard     Home Equipment: Environmental consultant - 2 wheels          Prior Functioning/Environment Level of Independence: Independent                 OT Problem List:        OT Treatment/Interventions:      OT Goals(Current goals can be found in the care plan section) Acute Rehab OT Goals Patient Stated Goal: to go home  OT Goal Formulation: All assessment and education complete, DC therapy  OT Frequency:     Barriers to D/C:            Co-evaluation              AM-PAC PT "6 Clicks" Daily Activity     Outcome Measure Help from another person eating meals?: None Help from another person taking care of personal grooming?: A Little Help from another person toileting, which includes using toliet, bedpan, or urinal?: A Little Help from another person bathing (including washing, rinsing, drying)?: A Little Help from another person to put on and taking off regular upper body clothing?: None Help from another person to put on and taking off regular lower body clothing?: A Little 6 Click Score: 20   End of Session Nurse Communication: Mobility status;Other (comment)(equipment and f/u needs)  Activity Tolerance: Patient tolerated treatment well Patient left: in chair;with call bell/phone within reach  OT Visit  Diagnosis: Unsteadiness on feet (R26.81);Pain Pain - part of body: (back)                Time: 1287-8676 OT Time Calculation (min): 14 min Charges:  OT General Charges $OT Visit: 1 Visit OT Evaluation $OT Eval Low Complexity: 1 Low G-Codes:     Odelle Kosier A. Ulice Brilliant, M.S., OTR/L Pager: [place] 10/31/2017, 8:45 AM

## 2017-10-31 NOTE — Op Note (Signed)
NAME:  Timothy Peters, Timothy Peters NO.:  [id number]  MEDICAL RECORD NO.:  [id number]  LOCATION:                                 FACILITY:  PHYSICIAN:  Susa Day, M.D.    DATE OF BIRTH:  Mar 29, 1951  DATE OF PROCEDURE:  10/30/2017 DATE OF DISCHARGE:                              OPERATIVE REPORT   PREOPERATIVE DIAGNOSIS:  Spinal stenosis, L3-4, L4-5.  POSTOPERATIVE DIAGNOSIS:  Spinal stenosis, L3-4, L4-5.  PROCEDURES PERFORMED: 1. Microlumbar decompression, L4-5 bilaterally, L3-4 bilaterally. 2. Foraminotomies, L4-L5 bilaterally. 3. Foraminotomies, L3 bilaterally.  ANESTHESIA:  General.  ASSISTANT:  Lacie Draft, PA.  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  He was indicated for decompression at 4-5 and at 3-4.  Initially discussed insertion of a CoFlex device as he has slight offset at 4-5.  This, however, was not approved by his insurance carrier.  No effusion.  He had a minimal offset without instability on flexion-extension.  We discussed the decompression at 4-5 and at 3-4.  Risks and benefits were discussed including bleeding, infection, damage to the neurovascular structures, no change in symptoms, worsening symptoms, DVT, PE, anesthetic complications, etc.  TECHNIQUE:  With the patient in supine position after induction of adequate general anesthesia, 2 g of Kefzol, placed prone on the [place] frame.  All bony prominences were well padded.  Lumbar region was prepped and draped in usual sterile fashion.  Two 18-gauge spinal needles were utilized to localize the 3-4 and 4-5 interspace, confirmed with x-ray.  Incision was made from the spinous process of 3 to below 5. Subcutaneous tissue was dissected, electrocautery was utilized  to achieve hemostasis.  A 0.25% Marcaine with epinephrine was infiltrated in the subcutaneous tissue as well as the paraspinous musculature for anesthesia and hemostasis.  We divided the dorsolumbar fascia in line with the skin incision.  Paraspinous muscle elevated and cauterized from 3-4 and 4-5 lamina.  McCullough retractor was placed.  Operating microscope draped and brought on the surgical field.  He had very small interlaminar window bilaterally.  Therefore, to avoid compromising his facets, we used a central approach.  I removed the spinous process of 4, the interspinous ligament, a portion of the spinous process of 3 and of 5.  Leksell rongeur was utilized also, performed partial hemilaminotomies of the caudad edge of 4.  A microcurette straight was utilized to detach the ligamentum flavum from the cephalad edge of 5 and in the caudad edge of 4.  A 2-mm Kerrison was then utilized to perform hemilaminotomies of 4 bilaterally preserving the pars.  A neuro patty placed beneath the ligamentum flavum, which was hypertrophic bilaterally with severe lateral recess stenosis.  After performing hemilaminotomies to detach the ligamentum flavum from the caudad edge of 4, hemilaminotomies of 5 were performed bilaterally with stenosis noted both into the foramen.  Woodson retractor was utilized to protect the neural elements at all time.  I performed generous foraminotomies of 5 bilateral and of 4.  We  undercut the facets bilaterally to the medial border of the pedicle.  Hypertrophic ligamentum flavum was noted and facet hypertrophy.  There was no disk herniation noted bilaterally. Bipolar electrocautery was utilized to achieve hemostasis as was thrombin-soaked Gelfoam and bone wax.  Following this, we had good restoration of thecal sac and neuroprobe passed freely at the foramen of 4 and of 5.  Then, in a similar fashion, performed hemilaminotomies of the caudad edge of 3, detached the  ligamentum flavum from the cephalad edge of 4.  Generous portion of the neural arch, I felt it was appropriate to not resect for stability.  Ligamentum flavum removed from the interspace at 3-4, protecting the neural elements at all time. There was lateral recess stenosis noted bilaterally and there was epidural lipomatosis noted at 3-4 and at 4-5 as well contributing. Following this, decompressed the lateral recesses bilaterally. Neuroprobe passed freely at the foramen of 3, 4 and 5 bilaterally.  Pars was intact as was the neural arch.  Next, confirmatory radiograph obtained, was marked with a Woodson in the foramen of 3 and of 5.  Copiously irrigated with antibiotic irrigation. Inspection revealed no evidence of CSF leakage or active bleeding. Placed bone wax on all of the cancellous surfaces.  Bipolar electrocautery was utilized to achieve hemostasis.  The McCullough retractor was removed and irrigated.  Thrombin-soaked Gelfoam was placed in the laminotomy defect.  After copious irrigation, we reapproximated the dorsolumbar fascia with 1 Vicryl interrupted figure-of-eight sutures.  This was after inspecting the paraspinous musculature, cauterized any bleeding.  Following the closure of the dorsolumbar fascia, noted a small aperture of a centimeter to allow for any drainage if it occurred, subcu with 2-0 and skin with staples.  Wound was dressed sterilely.  Placed supine on the hospital bed, extubated without difficulty and transported to the recovery room in satisfactory condition.  The patient tolerated the procedure well.  No complications.  Assistant, Lacie Draft, [place].  150 cc of blood loss.     Susa Day, M.D.     Geralynn Rile  D:  10/30/2017  T:  10/31/2017  Job:  [id number]

## 2017-10-31 NOTE — Progress Notes (Signed)
Physical Therapy Treatment Patient Details Name: Timothy Peters MRN: [id number] DOB: 07-15-1951 Today's Date: 10/31/2017    History of Present Illness Pt is a 67 y/o male s/p L3-5 decompression. PMH includes DM and HTN.     PT Comments    Pt progressing towards physical therapy goals. Focus of session was mostly education and gait training. Pt was able to ambulate well without AD and grossly mod I. Did not observe transfers as pt reports he would rather stand and wait for transport chair. Pt was verbally educated on proper posture with transfers and sitting posture. VC's for maintenance of precautions throughout. Wife present for education as well and they were educated on car transfer, safe mobility progression and maintenance of precautions. Pt anticipates d/c home today and is safe from a physical therapy standpoint. Will sign off at this time. If needs change, please reconsult.    Follow Up Recommendations  No PT follow up;Supervision for mobility/OOB     Equipment Recommendations  None recommended by PT    Recommendations for Other Services       Precautions / Restrictions Precautions Precautions: Back Precaution Booklet Issued: No Precaution Comments: Pt able to recall 3/3 back precautions at start of session. Restrictions Weight Bearing Restrictions: No    Mobility  Bed Mobility               General bed mobility comments: Pt received standing in hall at nurses station.   Transfers Overall transfer level: Modified independent Equipment used: None Transfers: Sit to/from Stand Sit to Stand: Supervision         General transfer comment: Noted pt performing transfer to transfer chair for d/c home. No assist required.   Ambulation/Gait Ambulation/Gait assistance: Modified independence Ambulation Distance (Feet): 300 Feet Assistive device: None Gait Pattern/deviations: Step-through pattern;Decreased stride length Gait velocity: Decreased  Gait velocity  interpretation: Below normal speed for age/gender General Gait Details: Slow, guarded, slightly unsteady gait. Min guard for steadying assist throughout. Educated about generalized walking program to perform at home.    Stairs            Wheelchair Mobility    Modified Rankin (Stroke Patients Only)       Balance Overall balance assessment: Needs assistance Sitting-balance support: No upper extremity supported;Feet supported Sitting balance-Leahy Scale: Good     Standing balance support: No upper extremity supported;During functional activity Standing balance-Leahy Scale: Good                              Cognition Arousal/Alertness: Awake/alert Behavior During Therapy: WFL for tasks assessed/performed Overall Cognitive Status: Within Functional Limits for tasks assessed                                        Exercises      General Comments        Pertinent Vitals/Pain Pain Assessment: Faces Faces Pain Scale: Hurts a little bit Pain Location: back Pain Descriptors / Indicators: Discomfort;Sore Pain Intervention(s): Monitored during session    Home Living Family/patient expects to be discharged to:: Private residence Living Arrangements: Spouse/significant other;Children Available Help at Discharge: Family;Available 24 hours/day Type of Home: House Home Access: Stairs to enter Entrance Stairs-Rails: Right Home Layout: One level Home Equipment: Walker - 2 wheels      Prior Function Level of Independence: Independent  PT Goals (current goals can now be found in the care plan section) Acute Rehab PT Goals Patient Stated Goal: to go home  PT Goal Formulation: With patient Time For Goal Achievement: 11/13/17 Potential to Achieve Goals: Good Progress towards PT goals: Progressing toward goals    Frequency    Min 5X/week      PT Plan Current plan remains appropriate    Co-evaluation               AM-PAC PT "6 Clicks" Daily Activity  Outcome Measure  Difficulty turning over in bed (including adjusting bedclothes, sheets and blankets)?: None Difficulty moving from lying on back to sitting on the side of the bed? : None Difficulty sitting down on and standing up from a chair with arms (e.g., wheelchair, bedside commode, etc,.)?: None Help needed moving to and from a bed to chair (including a wheelchair)?: None Help needed walking in hospital room?: None Help needed climbing 3-5 steps with a railing? : A Little 6 Click Score: 23    End of Session Equipment Utilized During Treatment: Gait belt Activity Tolerance: Patient tolerated treatment well Patient left: with family/visitor present(Standing in room with wife present) Nurse Communication: Mobility status PT Visit Diagnosis: Other abnormalities of gait and mobility (R26.89);Unsteadiness on feet (R26.81);Pain Pain - part of body: (back )     Time: 2956-2130 PT Time Calculation (min) (ACUTE ONLY): 9 min  Charges:  $Gait Training: 8-22 mins                    G Codes:       Rolinda Roan, PT, DPT Acute Rehabilitation Services Pager: [phone]    Thelma Comp 10/31/2017, 9:54 AM

## 2017-10-31 NOTE — Progress Notes (Signed)
Patient alert and oriented, mae's well, voiding adequate amount of urine, swallowing without difficulty,  c/o pain at time of discharge. And medication given prior to discharge. Patient discharged home with family. Script and discharged instructions given to patient. Patient and family stated understanding of instructions given. Patient has an appointment with Dr. Tonita Cong

## 2017-10-31 NOTE — Progress Notes (Signed)
Subjective: 1 Day Post-Op Procedure(s) (LRB): Microlumbar Decompression Bilateral Lumbar Three- Four, Lumbar Four-Five (N/A) Patient reports pain as mild.  Reports incisional pain. Leg pain much improved. Still a little numbness dorsal foot but improved from pre-op. Soreness L hip/buttock. Voiding without difficulty. No other c/o. Feels ready to go home today.  Objective: Vital signs in last 24 hours: Temp:  [98 F (36.7 C)-98.6 F (37 C)] 98.3 F (36.8 C) (03/15 0347) Pulse Rate:  [63-91] 89 (03/15 0347) Resp:  [12-18] 18 (03/15 0347) BP: (123-156)/(71-92) 156/73 (03/15 0347) SpO2:  [93 %-98 %] 97 % (03/15 0347)  Intake/Output from previous day: 03/14 0701 - 03/15 0700 In: 1830 [P.O.:580; I.V.:1050; IV Piggyback:200] Out: 495 [Urine:345; Blood:150] Intake/Output this shift: No intake/output data recorded.  Recent Labs    10/31/17 0620  HGB 11.2*   Recent Labs    10/31/17 0620  WBC 11.8*  RBC 4.18*  HCT 35.6*  PLT 298   Recent Labs    10/31/17 0620  NA 135  K 4.2  CL 103  CO2 21*  BUN 23*  CREATININE 1.11  GLUCOSE 346*  CALCIUM 9.3   No results for input(s): LABPT, INR in the last 72 hours.  Neurologically intact ABD soft Neurovascular intact Sensation intact distally Intact pulses distally Dorsiflexion/Plantar flexion intact Incision: dressing C/D/I and no drainage No cellulitis present Compartment soft no calf pain or sign of DVT  Assessment/Plan: 1 Day Post-Op Procedure(s) (LRB): Microlumbar Decompression Bilateral Lumbar Three- Four, Lumbar Four-Five (N/A) Advance diet Up with therapy D/C IV fluids  Resume PO diabetic meds Discussed dressing instructions, D/C instructions, Lspine precautions D/C home today Follow up in 2 weeks outpatient with Dr. Tonita Cong Will discuss with Dr. Mliss Fritz, [place]. 10/31/2017, 7:40 AM

## 2017-11-06 MED FILL — OXYCODONE-ACETAMINOPHEN 5-3: 5-325 | 7 days supply | Qty: 40 | Fill #0

## 2017-11-10 DIAGNOSIS — E1140 Type 2 diabetes mellitus with diabetic neuropathy, unspecified: Secondary | ICD-10-CM | POA: Diagnosis not present

## 2017-11-10 DIAGNOSIS — K635 Polyp of colon: Secondary | ICD-10-CM | POA: Diagnosis not present

## 2017-11-10 DIAGNOSIS — E7800 Pure hypercholesterolemia, unspecified: Secondary | ICD-10-CM | POA: Diagnosis not present

## 2017-11-10 DIAGNOSIS — M4800 Spinal stenosis, site unspecified: Secondary | ICD-10-CM | POA: Diagnosis not present

## 2017-11-10 DIAGNOSIS — I5189 Other ill-defined heart diseases: Secondary | ICD-10-CM | POA: Diagnosis not present

## 2017-11-10 DIAGNOSIS — I10 Essential (primary) hypertension: Secondary | ICD-10-CM | POA: Diagnosis not present

## 2017-11-13 MED FILL — OXYCODONE-ACETAMINOPHEN 5-3: 5-325 | 7 days supply | Qty: 40 | Fill #0

## 2017-11-13 MED FILL — GABAPENTIN 300 MG CAPSULE: 300 | 30 days supply | Qty: 90 | Fill #0

## 2017-11-17 DIAGNOSIS — H43813 Vitreous degeneration, bilateral: Secondary | ICD-10-CM | POA: Diagnosis not present

## 2017-11-17 DIAGNOSIS — H35033 Hypertensive retinopathy, bilateral: Secondary | ICD-10-CM | POA: Diagnosis not present

## 2017-11-17 DIAGNOSIS — E113313 Type 2 diabetes mellitus with moderate nonproliferative diabetic retinopathy with macular edema, bilateral: Secondary | ICD-10-CM | POA: Diagnosis not present

## 2017-11-17 DIAGNOSIS — H3582 Retinal ischemia: Secondary | ICD-10-CM | POA: Diagnosis not present

## 2017-11-17 MED FILL — LATANOPROST 0.005% EYE DRP: 0.005 | 50 days supply | Qty: 3 | Fill #0

## 2017-11-20 MED FILL — METOPROLOL SUCCINATE ER 25: 25 | 90 days supply | Qty: 90 | Fill #3

## 2017-11-21 DIAGNOSIS — M545 Low back pain: Secondary | ICD-10-CM | POA: Diagnosis not present

## 2017-11-26 DIAGNOSIS — M545 Low back pain: Secondary | ICD-10-CM | POA: Diagnosis not present

## 2017-11-27 MED FILL — LOSARTAN POTASSIUM 100 MG T: 100 | 90 days supply | Qty: 90 | Fill #3

## 2017-11-28 DIAGNOSIS — M545 Low back pain: Secondary | ICD-10-CM | POA: Diagnosis not present

## 2017-12-01 MED FILL — IBUPROFEN 800 MG TAB: 800 | 30 days supply | Qty: 90 | Fill #0

## 2017-12-01 MED FILL — OXYCODONE-ACETAMINOPHEN 5-3: 5-325 | 6 days supply | Qty: 40 | Fill #0

## 2017-12-02 DIAGNOSIS — M545 Low back pain: Secondary | ICD-10-CM | POA: Diagnosis not present

## 2017-12-04 DIAGNOSIS — M545 Low back pain: Secondary | ICD-10-CM | POA: Diagnosis not present

## 2017-12-08 DIAGNOSIS — M545 Low back pain: Secondary | ICD-10-CM | POA: Diagnosis not present

## 2017-12-08 MED FILL — LANTUS SOLOSTAR 100 UNITS/M: 100 | 30 days supply | Qty: 30 | Fill #9

## 2017-12-10 DIAGNOSIS — M545 Low back pain: Secondary | ICD-10-CM | POA: Diagnosis not present

## 2017-12-11 MED FILL — OXYCODONE-ACETAMINOPHEN 5-3: 5-325 | 15 days supply | Qty: 30 | Fill #0

## 2017-12-18 MED FILL — SIMVASTATIN 20 MG TABLET: 20 | 90 days supply | Qty: 90 | Fill #3

## 2017-12-25 DIAGNOSIS — H35033 Hypertensive retinopathy, bilateral: Secondary | ICD-10-CM | POA: Diagnosis not present

## 2017-12-25 DIAGNOSIS — H3582 Retinal ischemia: Secondary | ICD-10-CM | POA: Diagnosis not present

## 2017-12-25 DIAGNOSIS — E113311 Type 2 diabetes mellitus with moderate nonproliferative diabetic retinopathy with macular edema, right eye: Secondary | ICD-10-CM | POA: Diagnosis not present

## 2017-12-25 DIAGNOSIS — E113313 Type 2 diabetes mellitus with moderate nonproliferative diabetic retinopathy with macular edema, bilateral: Secondary | ICD-10-CM | POA: Diagnosis not present

## 2018-01-05 DIAGNOSIS — E113312 Type 2 diabetes mellitus with moderate nonproliferative diabetic retinopathy with macular edema, left eye: Secondary | ICD-10-CM | POA: Diagnosis not present

## 2018-01-09 MED FILL — LANTUS SOLOSTAR 100 UNITS/M: 100 | 30 days supply | Qty: 30 | Fill #0

## 2018-01-10 DIAGNOSIS — J209 Acute bronchitis, unspecified: Secondary | ICD-10-CM | POA: Diagnosis not present

## 2018-01-22 DIAGNOSIS — E113313 Type 2 diabetes mellitus with moderate nonproliferative diabetic retinopathy with macular edema, bilateral: Secondary | ICD-10-CM | POA: Diagnosis not present

## 2018-01-22 DIAGNOSIS — H43813 Vitreous degeneration, bilateral: Secondary | ICD-10-CM | POA: Diagnosis not present

## 2018-01-22 DIAGNOSIS — E113311 Type 2 diabetes mellitus with moderate nonproliferative diabetic retinopathy with macular edema, right eye: Secondary | ICD-10-CM | POA: Diagnosis not present

## 2018-01-22 DIAGNOSIS — H35033 Hypertensive retinopathy, bilateral: Secondary | ICD-10-CM | POA: Diagnosis not present

## 2018-01-22 DIAGNOSIS — H3582 Retinal ischemia: Secondary | ICD-10-CM | POA: Diagnosis not present

## 2018-02-02 MED FILL — glipiZIDE 5 MG TABS: 5 | 90 days supply | Qty: 90 | Fill #3

## 2018-02-02 MED FILL — metFORMIN HCL 1000 MG TABS: 1000 | 90 days supply | Qty: 180 | Fill #1

## 2018-02-02 MED FILL — OMEGA-3 ETHYL ESTERS 1 GM C: 1 | 90 days supply | Qty: 180 | Fill #2

## 2018-02-05 DIAGNOSIS — E113312 Type 2 diabetes mellitus with moderate nonproliferative diabetic retinopathy with macular edema, left eye: Secondary | ICD-10-CM | POA: Diagnosis not present

## 2018-02-05 MED FILL — IBUPROFEN 800 MG TAB: 800 | 30 days supply | Qty: 90 | Fill #1

## 2018-02-06 MED FILL — LATANOPROST 0.005% EYE DRP: 0.005 | 50 days supply | Qty: 3 | Fill #0

## 2018-02-11 MED FILL — LANTUS SOLOSTAR 100 UNITS/M: 100 | 30 days supply | Qty: 30 | Fill #1

## 2018-02-12 DIAGNOSIS — Z7984 Long term (current) use of oral hypoglycemic drugs: Secondary | ICD-10-CM | POA: Diagnosis not present

## 2018-02-12 DIAGNOSIS — Z86010 Personal history of colonic polyps: Secondary | ICD-10-CM | POA: Diagnosis not present

## 2018-02-12 DIAGNOSIS — M5442 Lumbago with sciatica, left side: Secondary | ICD-10-CM | POA: Diagnosis not present

## 2018-02-12 DIAGNOSIS — E119 Type 2 diabetes mellitus without complications: Secondary | ICD-10-CM | POA: Diagnosis not present

## 2018-02-17 MED FILL — METOPROLOL SUCCINATE ER 25: 25 | 90 days supply | Qty: 90 | Fill #4

## 2018-02-23 DIAGNOSIS — H43813 Vitreous degeneration, bilateral: Secondary | ICD-10-CM | POA: Diagnosis not present

## 2018-02-23 DIAGNOSIS — E113311 Type 2 diabetes mellitus with moderate nonproliferative diabetic retinopathy with macular edema, right eye: Secondary | ICD-10-CM | POA: Diagnosis not present

## 2018-02-23 DIAGNOSIS — H35033 Hypertensive retinopathy, bilateral: Secondary | ICD-10-CM | POA: Diagnosis not present

## 2018-02-23 DIAGNOSIS — E113313 Type 2 diabetes mellitus with moderate nonproliferative diabetic retinopathy with macular edema, bilateral: Secondary | ICD-10-CM | POA: Diagnosis not present

## 2018-02-23 DIAGNOSIS — H3582 Retinal ischemia: Secondary | ICD-10-CM | POA: Diagnosis not present

## 2018-03-02 DIAGNOSIS — E1140 Type 2 diabetes mellitus with diabetic neuropathy, unspecified: Secondary | ICD-10-CM | POA: Diagnosis not present

## 2018-03-02 DIAGNOSIS — I10 Essential (primary) hypertension: Secondary | ICD-10-CM | POA: Diagnosis not present

## 2018-03-02 DIAGNOSIS — K2900 Acute gastritis without bleeding: Secondary | ICD-10-CM | POA: Diagnosis not present

## 2018-03-02 MED FILL — OMEPRAZOLE 20 MG CAP: 20 | 30 days supply | Qty: 30 | Fill #0

## 2018-03-02 MED FILL — LOSARTAN POTASSIUM 100 MG T: 100 | 90 days supply | Qty: 90 | Fill #0

## 2018-03-09 DIAGNOSIS — E113312 Type 2 diabetes mellitus with moderate nonproliferative diabetic retinopathy with macular edema, left eye: Secondary | ICD-10-CM | POA: Diagnosis not present

## 2018-03-09 DIAGNOSIS — E113311 Type 2 diabetes mellitus with moderate nonproliferative diabetic retinopathy with macular edema, right eye: Secondary | ICD-10-CM | POA: Diagnosis not present

## 2018-03-09 MED FILL — LANTUS SOLOSTAR 100 UNITS/M: 100 | 30 days supply | Qty: 30 | Fill #2

## 2018-03-13 DIAGNOSIS — E1140 Type 2 diabetes mellitus with diabetic neuropathy, unspecified: Secondary | ICD-10-CM | POA: Diagnosis not present

## 2018-03-16 ENCOUNTER — Ambulatory Visit
Admission: RE | Admit: 2018-03-16 | Discharge: 2018-03-16 | Disposition: A | Discharge status: Home / Self Care | Payer: BLUE CROSS/BLUE SHIELD | Source: Ambulatory Visit | Attending: Internal Medicine | Admitting: Internal Medicine

## 2018-03-16 ENCOUNTER — Other Ambulatory Visit: Payer: Self-pay | Admitting: Internal Medicine

## 2018-03-16 DIAGNOSIS — E1140 Type 2 diabetes mellitus with diabetic neuropathy, unspecified: Secondary | ICD-10-CM | POA: Diagnosis not present

## 2018-03-16 DIAGNOSIS — D649 Anemia, unspecified: Secondary | ICD-10-CM | POA: Diagnosis not present

## 2018-03-16 DIAGNOSIS — R1012 Left upper quadrant pain: Secondary | ICD-10-CM | POA: Diagnosis not present

## 2018-03-16 DIAGNOSIS — K8020 Calculus of gallbladder without cholecystitis without obstruction: Secondary | ICD-10-CM | POA: Diagnosis not present

## 2018-03-16 MED FILL — raNITIdine HCL 150 MG TABS: 150 | 30 days supply | Qty: 30 | Fill #0

## 2018-03-17 ENCOUNTER — Other Ambulatory Visit: Payer: Self-pay | Admitting: Internal Medicine

## 2018-03-17 DIAGNOSIS — R9389 Abnormal findings on diagnostic imaging of other specified body structures: Secondary | ICD-10-CM

## 2018-03-17 DIAGNOSIS — K8020 Calculus of gallbladder without cholecystitis without obstruction: Secondary | ICD-10-CM

## 2018-03-19 MED FILL — SIMVASTATIN 20 MG TABLET: 20 | 90 days supply | Qty: 90 | Fill #0

## 2018-03-23 DIAGNOSIS — K2900 Acute gastritis without bleeding: Secondary | ICD-10-CM | POA: Diagnosis not present

## 2018-03-23 DIAGNOSIS — K8020 Calculus of gallbladder without cholecystitis without obstruction: Secondary | ICD-10-CM | POA: Diagnosis not present

## 2018-03-25 ENCOUNTER — Other Ambulatory Visit: Payer: Self-pay

## 2018-03-26 ENCOUNTER — Other Ambulatory Visit: Payer: Self-pay | Admitting: Internal Medicine

## 2018-03-26 DIAGNOSIS — R1012 Left upper quadrant pain: Secondary | ICD-10-CM

## 2018-03-30 ENCOUNTER — Encounter (HOSPITAL_COMMUNITY): Payer: Self-pay | Admitting: Emergency Medicine

## 2018-03-30 ENCOUNTER — Emergency Department (HOSPITAL_COMMUNITY)
Admission: EM | Admit: 2018-03-30 | Discharge: 2018-03-30 | Disposition: A | Discharge status: Home / Self Care | Payer: BLUE CROSS/BLUE SHIELD | Attending: Emergency Medicine | Admitting: Emergency Medicine

## 2018-03-30 ENCOUNTER — Emergency Department (HOSPITAL_COMMUNITY): Payer: BLUE CROSS/BLUE SHIELD

## 2018-03-30 DIAGNOSIS — I10 Essential (primary) hypertension: Secondary | ICD-10-CM | POA: Diagnosis not present

## 2018-03-30 DIAGNOSIS — M549 Dorsalgia, unspecified: Secondary | ICD-10-CM | POA: Diagnosis not present

## 2018-03-30 DIAGNOSIS — R1010 Upper abdominal pain, unspecified: Secondary | ICD-10-CM | POA: Insufficient documentation

## 2018-03-30 DIAGNOSIS — Z7982 Long term (current) use of aspirin: Secondary | ICD-10-CM | POA: Insufficient documentation

## 2018-03-30 DIAGNOSIS — E785 Hyperlipidemia, unspecified: Secondary | ICD-10-CM | POA: Diagnosis not present

## 2018-03-30 DIAGNOSIS — J40 Bronchitis, not specified as acute or chronic: Secondary | ICD-10-CM | POA: Diagnosis not present

## 2018-03-30 DIAGNOSIS — Z87891 Personal history of nicotine dependence: Secondary | ICD-10-CM | POA: Insufficient documentation

## 2018-03-30 DIAGNOSIS — Z794 Long term (current) use of insulin: Secondary | ICD-10-CM | POA: Insufficient documentation

## 2018-03-30 DIAGNOSIS — Z79899 Other long term (current) drug therapy: Secondary | ICD-10-CM | POA: Insufficient documentation

## 2018-03-30 DIAGNOSIS — E109 Type 1 diabetes mellitus without complications: Secondary | ICD-10-CM | POA: Insufficient documentation

## 2018-03-30 DIAGNOSIS — K5900 Constipation, unspecified: Secondary | ICD-10-CM | POA: Diagnosis not present

## 2018-03-30 DIAGNOSIS — K8020 Calculus of gallbladder without cholecystitis without obstruction: Secondary | ICD-10-CM | POA: Diagnosis not present

## 2018-03-30 LAB — BASIC METABOLIC PANEL
Anion gap: 11 (ref 5–15)
BUN: 15 mg/dL (ref 8–23)
CO2: 24 mmol/L (ref 22–32)
Calcium: 9.2 mg/dL (ref 8.9–10.3)
Chloride: 105 mmol/L (ref 98–111)
Creatinine, Ser: 0.99 mg/dL (ref 0.61–1.24)
GFR calc Af Amer: >60 mL/min (ref >60)
GFR calc non Af Amer: >60 mL/min (ref >60)
Glucose, Bld: 159 mg/dL — ABNORMAL HIGH (ref 70–99)
Potassium: 4.2 mmol/L (ref 3.5–5.1)
Sodium: 140 mmol/L (ref 135–145)

## 2018-03-30 LAB — CBC
HCT: 38.2 % — ABNORMAL LOW (ref 39.0–52.0)
Hemoglobin: 11.6 g/dL — ABNORMAL LOW (ref 13.0–17.0)
MCH: 26.6 pg (ref 26.0–34.0)
MCHC: 30.4 g/dL (ref 30.0–36.0)
MCV: 87.6 fL (ref 78.0–100.0)
Platelets: 322 10*3/uL (ref 150–400)
RBC: 4.36 MIL/uL (ref 4.22–5.81)
RDW: 13.5 % (ref 11.5–15.5)
WBC: 8.3 10*3/uL (ref 4.0–10.5)

## 2018-03-30 LAB — HEPATIC FUNCTION PANEL
ALT: 19 U/L (ref 0–44)
AST: 25 U/L (ref 15–41)
Albumin: 3.5 g/dL (ref 3.5–5.0)
Alkaline Phosphatase: 65 U/L (ref 38–126)
Bilirubin, Direct: <0.1 mg/dL (ref 0.0–0.2)
Total Bilirubin: 0.6 mg/dL (ref 0.3–1.2)
Total Protein: 7.1 g/dL (ref 6.5–8.1)

## 2018-03-30 LAB — I-STAT TROPONIN, ED: Troponin i, poc: 0 ng/mL (ref 0.00–0.08)

## 2018-03-30 LAB — LIPASE, BLOOD: Lipase: 32 U/L (ref 11–51)

## 2018-03-30 MED ORDER — GI COCKTAIL ~~LOC~~
30.0000 mL | Freq: Once | ORAL | Status: AC
  Administered 2018-03-30: 30 mL via ORAL
  Filled 2018-03-30: qty 30

## 2018-03-30 MED ORDER — IOHEXOL 300 MG/ML  SOLN
100.0000 mL | Freq: Once | INTRAMUSCULAR | Status: AC | PRN
  Administered 2018-03-30: 100 mL via INTRAVENOUS

## 2018-03-30 MED ORDER — OXYCODONE-ACETAMINOPHEN 5-325 MG PO TABS: 1.0000 | ORAL_TABLET | ORAL | 0 refills | Status: DC | PRN

## 2018-03-30 MED ORDER — MORPHINE SULFATE (PF) 4 MG/ML IV SOLN
4.0000 mg | Freq: Once | INTRAVENOUS | Status: AC
  Administered 2018-03-30: 4 mg via INTRAVENOUS
  Filled 2018-03-30: qty 1

## 2018-03-30 MED ORDER — SUCRALFATE 1 G PO TABS: 1.0000 g | ORAL_TABLET | Freq: Three times a day (TID) | ORAL | 0 refills | Status: DC

## 2018-03-30 NOTE — ED Notes (Signed)
Patient transported to CT 

## 2018-03-30 NOTE — ED Provider Notes (Signed)
[facility] EMERGENCY DEPARTMENT Provider Note   CSN: [id number] Arrival date & time: 03/30/18  0840     History   Chief Complaint Chief Complaint  Patient presents with  . Chest Pain    HPI AUTHER LYERLY is a 67 y.o. male.  HPI Patient presents with several months of upper abdominal pain.  States the pain has become constant and kept him up most of the night.  Pain radiates around to the left flank.  Denies any shortness of breath.  No chest pain.  No new lower extremity swelling or pain.  States he is being treated for gastritis/GERD but has not had any improvements with the medication.  Denies any fever or chills.  No nausea vomiting.  Patient does have some difficulty with passing stool.  Denies melanotic or grossly bloody stool. Past Medical History:  Diagnosis Date  . 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 Erectile dysfunction   . Gallstone 06/16   on CT, also present on U/S in Jan 2017  . Hepatitis    C  . Hyperlipidemia   . Hypertension   . Spinal stenosis     Patient Active Problem List   Diagnosis Date Noted  . Spinal stenosis of lumbar region 10/30/2017  . Spinal stenosis at L4-L5 level 10/30/2017  . Liver fibrosis 03/19/2016  . Multilevel degenerative disc disease 12/29/2015  . Hepatitis C, chronic ([place]) 10/30/2015  . Abdominal wall pain in both upper quadrants 09/23/2015  . Cholelithiasis 09/21/2015  . Type 2 diabetes, uncontrolled, with neuropathy ([place]) 03/04/2012  . Health care maintenance 03/04/2012  . HTN (hypertension) 10/30/2011  . Hyperlipidemia 10/30/2011  . Erectile dysfunction 10/30/2011  . History of heroin abuse 10/30/2011    Past Surgical History:  Procedure Laterality Date  . LUMBAR LAMINECTOMY/DECOMPRESSION MICRODISCECTOMY N/A 10/30/2017   Procedure: Microlumbar Decompression Bilateral Lumbar Three- Four, Lumbar Four-Five;  Surgeon: Susa Day, MD;  Location: [place];  Service: Orthopedics;   Laterality: N/A;  150 mins  . nerve damage left arm  1990        Home Medications    Prior to Admission medications   Medication Sig Start Date End Date Taking? Authorizing Provider  aspirin 81 MG tablet Take 1 tablet (81 mg total) by mouth daily. Resume 4 days post-op 10/30/17  Yes Lacie Draft M, PA-C  gabapentin (NEURONTIN) 300 MG capsule Take 300 mg by mouth at bedtime as needed (for neuropathy or back pain.).   Yes [provider]  glipiZIDE (GLUCOTROL) 5 MG tablet Take 1 tablet daily with either lunch or dinner. If blood sugar less than 150, hold your dose. Patient now has [place] Patient taking differently: Take 5 mg by mouth daily. Take 1 tablet daily with either lunch or dinner. If blood sugar less than 150, hold your dose. Patient now has [employer] 12/04/16  Yes Maryellen Pile, MD  Insulin Glargine (LANTUS) 100 UNIT/ML Solostar Pen Inject 80 Units into the skin every morning. BCBS Patient taking differently: Inject 100 Units into the skin every morning. BCBS 12/04/16  Yes Maryellen Pile, MD  latanoprost (XALATAN) 0.005 % ophthalmic solution Place 1 drop into the left eye at bedtime. 02/06/18  Yes [provider]  LINIMENTS EX Apply 1 application topically daily. Horse Liniments for pain.   Yes [provider]  losartan (COZAAR) 100 MG tablet Take 1 tablet (100 mg total) by mouth daily. BCBS 12/04/16  Yes Maryellen Pile, MD  metFORMIN (GLUCOPHAGE) 1000  MG tablet Take 1 tablet (1,000 mg total) by mouth 2 (two) times daily with a meal. BCBS 12/04/16  Yes Maryellen Pile, MD  metoprolol succinate (TOPROL-XL) 25 MG 24 hr tablet Take 25 mg by mouth daily. 08/21/17  Yes [provider]  Multiple Vitamins-Minerals (MULTIVITAMIN WITH MINERALS) tablet Take 1 tablet by mouth daily. Adult multivitamin 50+   Yes [provider]  omega-3 acid ethyl esters (LOVAZA) 1 g capsule Take 2 capsules (2 g total) by mouth 2 (two) times daily. Patient taking differently:  Take 1 g by mouth 2 (two) times daily.  09/18/16  Yes Maryellen Pile, MD  omeprazole (PRILOSEC) 20 MG capsule Take 20 mg by mouth daily. 03/02/18  Yes [provider]  Polyethyl Glycol-Propyl Glycol (LUBRICANT EYE DROPS) 0.4-0.3 % SOLN Place 1-2 drops into both eyes 3 (three) times daily as needed (for dry/irritated eyes).   Yes [provider]  ranitidine (ZANTAC) 150 MG tablet Take 150 mg by mouth at bedtime. 03/16/18  Yes [provider]  simvastatin (ZOCOR) 20 MG tablet Take 20 mg by mouth daily. 03/19/18  Yes [provider]  docusate sodium (COLACE) 100 MG capsule Take 1 capsule (100 mg total) by mouth 2 (two) times daily. Patient not taking: Reported on 03/30/2018 10/30/17 10/30/18  Susa Day, MD  glucose blood test strip Use as instructed 12/29/15   Riccardo Dubin, MD  ibuprofen (ADVIL,MOTRIN) 800 MG tablet Take 1 tablet (800 mg total) by mouth every 8 (eight) hours as needed for moderate pain. Resume 5 days post-op as needed Patient not taking: Reported on 03/30/2018 10/30/17   Cecilie Kicks, PA-C  Insulin Pen Needle 31G X 5 MM MISC 1 Units by Does not apply route as directed. 12/04/16   Maryellen Pile, MD  oxyCODONE-acetaminophen (PERCOCET) 5-325 MG tablet Take 1 tablet by mouth every 4 (four) hours as needed for severe pain. 03/30/18   Julianne Rice, MD  polyethylene glycol [facility] / Floria Raveling) packet Take 17 g by mouth daily. Patient not taking: Reported on 03/30/2018 10/30/17   Susa Day, MD  sucralfate (CARAFATE) 1 g tablet Take 1 tablet (1 g total) by mouth 4 (four) times daily -  with meals and at bedtime. 03/30/18   Julianne Rice, MD    Family History Family History  Problem Relation Age of Onset  . Hypertension Mother   . Stroke Mother   . Heart attack Father   . Cancer Sister   . Heart attack Maternal Grandmother   . Hypertension Maternal Grandfather   . Stroke Paternal Grandmother   . Stroke Paternal Grandfather   . Cancer  Brother   . Diabetes Other   . Hypertension Other   . Hyperlipidemia Other     Social History Social History   Tobacco Use  . Smoking status: Former Smoker    Last attempt to quit: 11/30/2007    Years since quitting: 10.3  . Smokeless tobacco: Never Used  Substance Use Topics  . Alcohol use: No    Alcohol/week: 0.0 standard drinks    Comment: Quit x 13 yrs.  . Drug use: No    Comment: past iv drug user     Allergies   Patient has no known allergies.   Review of Systems Review of Systems  Constitutional: Negative for chills and fever.  HENT: Negative for sore throat and trouble swallowing.   Eyes: Negative for visual disturbance.  Respiratory: Negative for cough and shortness of breath.   Cardiovascular: Negative for  chest pain, palpitations and leg swelling.  Gastrointestinal: Positive for abdominal distention, abdominal pain and constipation. Negative for diarrhea, nausea and vomiting.  Genitourinary: Negative for dysuria, flank pain, frequency and hematuria.  Musculoskeletal: Positive for back pain. Negative for neck pain and neck stiffness.  Skin: Negative for rash and wound.  Neurological: Negative for dizziness, weakness, light-headedness, numbness and headaches.  All other systems reviewed and are negative.    Physical Exam Updated Vital Signs BP (!) 147/91   Pulse (!) 43   Temp 98.3 F (36.8 C) (Oral)   Resp 14   Ht 5\' 11"  (1.803 m)   Wt 106.6 kg   SpO2 96%   BMI 32.78 kg/m   Physical Exam  Constitutional: He is oriented to person, place, and time. He appears well-developed and well-nourished. No distress.  HENT:  Head: Normocephalic and atraumatic.  Mouth/Throat: Oropharynx is clear and moist.  Eyes: Pupils are equal, round, and reactive to light. EOM are normal.  Neck: Normal range of motion. Neck supple.  Cardiovascular: Normal rate and regular rhythm.  Pulmonary/Chest: Effort normal and breath sounds normal.  Abdominal: Soft. Bowel sounds are  normal. There is tenderness. There is no rebound and no guarding.  Epigastric and left upper quadrant tenderness to palpation.  Diffuse abdominal distention without rebound or guarding.  Musculoskeletal: Normal range of motion. He exhibits no edema or tenderness.  No midline thoracic or lumbar tenderness.  No lower extremity swelling, asymmetry or tenderness.  Distal pulses intact.  Neurological: He is alert and oriented to person, place, and time.  Skin: Skin is warm and dry. Capillary refill takes less than 2 seconds. No rash noted. He is not diaphoretic. No erythema.  Psychiatric: He has a normal mood and affect. His behavior is normal.  Nursing note and vitals reviewed.    ED Treatments / Results  Labs (all labs ordered are listed, but only abnormal results are displayed) Labs Reviewed  BASIC METABOLIC PANEL - Abnormal; Notable for the following components:      Result Value   Glucose, Bld 159 (*)    All other components within normal limits  CBC - Abnormal; Notable for the following components:   Hemoglobin 11.6 (*)    HCT 38.2 (*)    All other components within normal limits  HEPATIC FUNCTION PANEL  LIPASE, BLOOD  I-STAT TROPONIN, ED    EKG EKG Interpretation  Date/Time:  Monday March 30 2018 09:06:46 EDT Ventricular Rate:  73 PR Interval:  172 QRS Duration: 82 QT Interval:  396 QTC Calculation: 436 R Axis:   8 Text Interpretation:  Normal sinus rhythm Normal ECG Confirmed by Julianne Rice [phone]) on 03/30/2018 12:18:50 PM   Radiology Dg Chest 2 View  Result Date: 03/30/2018 CLINICAL DATA:  Left chest pain for 1 month. EXAM: CHEST - 2 VIEW COMPARISON:  PA and lateral chest 12/25/2015. FINDINGS: There is some peribronchial thickening. The lungs are clear without consolidative process. No pneumothorax or pleural effusion. No acute bony abnormality. IMPRESSION: Bronchitic change.  No focal process. Electronically Signed   By: Inge Rise M.D.   On: 03/30/2018  09:43   Ct Abdomen Pelvis W Contrast  Result Date: 03/30/2018 CLINICAL DATA:  Initial evaluation for acute generalized abdominal pain. EXAM: CT ABDOMEN AND PELVIS WITH CONTRAST TECHNIQUE: Multidetector CT imaging of the abdomen and pelvis was performed using the standard protocol following bolus administration of intravenous contrast. CONTRAST:  169mL OMNIPAQUE IOHEXOL 300 MG/ML  SOLN COMPARISON:  Prior radiograph from 03/16/2018  and prior CT from 02/10/2015. FINDINGS: Lower chest: Mild scattered subsegmental atelectatic changes present within the visualized lung bases. Visualized lungs are otherwise clear. Hepatobiliary: Liver demonstrates a normal contrast enhanced appearance. Calcified stones present within the gallbladder lumen. No evidence for acute cholecystitis. No biliary dilatation. Pancreas: Pancreas within normal limits. Spleen: Spleen within normal limits. Adrenals/Urinary Tract: Adrenal glands are normal. Kidneys equal in size with symmetric enhancement. 2.4 cm cyst present within the upper pole of the left kidney. Subcentimeter parenchymal calcification noted within the adjacent upper pole as well. Few additional scattered subcentimeter hypodensities too small the characterize, but statistically likely reflects small cysts as well. No nephrolithiasis or hydronephrosis no focal enhancing renal mass. No hydroureter. Partially distended bladder within normal limits. Stomach/Bowel: Stomach within normal limits. No evidence for bowel obstruction normal appendix. No acute inflammatory changes seen about the bowels. Vascular/Lymphatic: Normal intravascular enhancement seen throughout the intra-abdominal aorta and its branch vessels. 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 No aneurysm. Mesenteric vessels patent proximally. No adenopathy. Reproductive: Prostate normal. Other: No free air or fluid. Small fat containing paraumbilical hernia noted without associated inflammation.  Musculoskeletal: No acute osseus abnormality. No worrisome lytic or blastic osseous lesions. Multilevel facet arthropathy noted within the lumbar spine. IMPRESSION: 1. No CT evidence for acute intra-abdominal or pelvic process. 2. Cholelithiasis without evidence for acute cholecystitis or biliary dilatation. 3. 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 Electronically Signed   By: Jeannine Boga M.D.   On: 03/30/2018 16:43    Procedures Procedures (including critical care time)  Medications Ordered in ED Medications  morphine 4 MG/ML injection 4 mg (4 mg Intravenous Given 03/30/18 1350)  iohexol (OMNIPAQUE) 300 MG/ML solution 100 mL (100 mLs Intravenous Contrast Given 03/30/18 1618)  gi cocktail (Maalox,Lidocaine,Donnatal) (30 mLs Oral Given 03/30/18 1659)     Initial Impression / Assessment and Plan / ED Course  I have reviewed the triage vital signs and the nursing notes.  Pertinent labs & imaging results that were available during my care of the patient were reviewed by me and considered in my medical decision making (see chart for details).    Patient's abdominal pain has resolved.  Abdomen is nontender.  Patient no time complained of chest pain.  EKG/troponin without evidence of ischemia.  CT with cholelithiasis but no evidence of cholecystitis.  Normal LFTs and white blood cell count.  Question biliary colic versus gastritis.  Will start on Carafate and refer to gastroenterology.  Return precautions have been given.  Final Clinical Impressions(s) / ED Diagnoses   Final diagnoses:  Upper abdominal pain    ED Discharge Orders         Ordered    oxyCODONE-acetaminophen (PERCOCET) 5-325 MG tablet  Every 4 hours PRN     03/30/18 1751    sucralfate (CARAFATE) 1 g tablet  3 times daily with meals & bedtime     03/30/18 1751           Julianne Rice, MD 03/30/18 1752

## 2018-03-30 NOTE — ED Notes (Signed)
Rec's printed and sent to main lab for add on Hepatic and Lipase

## 2018-03-30 NOTE — ED Notes (Signed)
IV attempt x2.

## 2018-03-30 NOTE — ED Triage Notes (Addendum)
Patient complains of intermittent burning left rib pain for the last several months. Has been seen by PCP multiple times, currently undergoing treatment for GERD. Denies nausea, vomiting, shortness of breath, dizziness. Also reports diagnosis of gallstones and a hernia. Patient alert, oriented, and ambulating independently with steady gait.

## 2018-04-03 ENCOUNTER — Inpatient Hospital Stay
Admission: RE | Admit: 2018-04-03 | Discharge: 2018-04-03 | Disposition: A | Discharge status: Home / Self Care | Payer: Self-pay | Source: Ambulatory Visit | Attending: Internal Medicine | Admitting: Internal Medicine

## 2018-04-06 DIAGNOSIS — E113313 Type 2 diabetes mellitus with moderate nonproliferative diabetic retinopathy with macular edema, bilateral: Secondary | ICD-10-CM | POA: Diagnosis not present

## 2018-04-06 DIAGNOSIS — E113311 Type 2 diabetes mellitus with moderate nonproliferative diabetic retinopathy with macular edema, right eye: Secondary | ICD-10-CM | POA: Diagnosis not present

## 2018-04-06 DIAGNOSIS — E113312 Type 2 diabetes mellitus with moderate nonproliferative diabetic retinopathy with macular edema, left eye: Secondary | ICD-10-CM | POA: Diagnosis not present

## 2018-04-06 MED FILL — LANTUS SOLOSTAR 100 UNITS/M: 100 | 30 days supply | Qty: 30 | Fill #3

## 2018-04-08 MED FILL — LATANOPROST 0.005% EYE DRP: 0.005 | 50 days supply | Qty: 3 | Fill #0

## 2018-04-08 MED FILL — SIMBRINZA 1%-0.2% EYE DROPS: 1-0.2 | 80 days supply | Qty: 8 | Fill #0

## 2018-04-10 ENCOUNTER — Encounter: Payer: Self-pay | Admitting: Physician Assistant

## 2018-04-13 DIAGNOSIS — E113312 Type 2 diabetes mellitus with moderate nonproliferative diabetic retinopathy with macular edema, left eye: Secondary | ICD-10-CM | POA: Diagnosis not present

## 2018-04-21 MED FILL — SUCRALFATE 1 GM TABLET: 1 | 30 days supply | Qty: 60 | Fill #0

## 2018-04-24 MED FILL — SILDENAFIL CITRATE 100 MG T: 100 | 30 days supply | Qty: 4 | Fill #0

## 2018-04-24 MED FILL — PEG-3350 SOLUTION: 420 | 1 days supply | Qty: 4000 | Fill #0

## 2018-04-29 ENCOUNTER — Ambulatory Visit: Payer: Self-pay | Admitting: Physician Assistant

## 2018-04-30 DIAGNOSIS — Z86010 Personal history of colonic polyps: Secondary | ICD-10-CM | POA: Diagnosis not present

## 2018-04-30 DIAGNOSIS — R079 Chest pain, unspecified: Secondary | ICD-10-CM | POA: Diagnosis not present

## 2018-04-30 DIAGNOSIS — K449 Diaphragmatic hernia without obstruction or gangrene: Secondary | ICD-10-CM | POA: Diagnosis not present

## 2018-04-30 DIAGNOSIS — D124 Benign neoplasm of descending colon: Secondary | ICD-10-CM | POA: Diagnosis not present

## 2018-04-30 DIAGNOSIS — R12 Heartburn: Secondary | ICD-10-CM | POA: Diagnosis not present

## 2018-05-01 MED FILL — LANTUS SOLOSTAR 100 UNITS/M: 100 | 30 days supply | Qty: 30 | Fill #4

## 2018-05-04 MED FILL — glipiZIDE 5 MG TABS: 5 | 90 days supply | Qty: 90 | Fill #4

## 2018-05-05 DIAGNOSIS — D124 Benign neoplasm of descending colon: Secondary | ICD-10-CM | POA: Diagnosis not present

## 2018-05-18 DIAGNOSIS — H35033 Hypertensive retinopathy, bilateral: Secondary | ICD-10-CM | POA: Diagnosis not present

## 2018-05-18 DIAGNOSIS — E113311 Type 2 diabetes mellitus with moderate nonproliferative diabetic retinopathy with macular edema, right eye: Secondary | ICD-10-CM | POA: Diagnosis not present

## 2018-05-18 DIAGNOSIS — E113313 Type 2 diabetes mellitus with moderate nonproliferative diabetic retinopathy with macular edema, bilateral: Secondary | ICD-10-CM | POA: Diagnosis not present

## 2018-05-18 DIAGNOSIS — H2513 Age-related nuclear cataract, bilateral: Secondary | ICD-10-CM | POA: Diagnosis not present

## 2018-05-18 DIAGNOSIS — H43813 Vitreous degeneration, bilateral: Secondary | ICD-10-CM | POA: Diagnosis not present

## 2018-05-19 MED FILL — METOPROLOL SUCCINATE ER 25: 25 | 90 days supply | Qty: 90 | Fill #0

## 2018-05-25 DIAGNOSIS — K8020 Calculus of gallbladder without cholecystitis without obstruction: Secondary | ICD-10-CM | POA: Diagnosis not present

## 2018-05-25 DIAGNOSIS — E663 Overweight: Secondary | ICD-10-CM | POA: Diagnosis not present

## 2018-05-25 DIAGNOSIS — R0781 Pleurodynia: Secondary | ICD-10-CM | POA: Diagnosis not present

## 2018-05-25 DIAGNOSIS — Z86010 Personal history of colonic polyps: Secondary | ICD-10-CM | POA: Diagnosis not present

## 2018-05-25 DIAGNOSIS — Z7984 Long term (current) use of oral hypoglycemic drugs: Secondary | ICD-10-CM | POA: Diagnosis not present

## 2018-05-25 DIAGNOSIS — E1140 Type 2 diabetes mellitus with diabetic neuropathy, unspecified: Secondary | ICD-10-CM | POA: Diagnosis not present

## 2018-05-25 MED FILL — SILDENAFIL CITRATE 100 MG T: 100 | 30 days supply | Qty: 4 | Fill #1

## 2018-05-27 ENCOUNTER — Other Ambulatory Visit: Payer: Self-pay | Admitting: Gastroenterology

## 2018-05-27 ENCOUNTER — Ambulatory Visit
Admission: RE | Admit: 2018-05-27 | Discharge: 2018-05-27 | Disposition: A | Discharge status: Home / Self Care | Payer: BLUE CROSS/BLUE SHIELD | Source: Ambulatory Visit | Attending: Gastroenterology | Admitting: Gastroenterology

## 2018-05-27 DIAGNOSIS — R0781 Pleurodynia: Secondary | ICD-10-CM

## 2018-05-27 DIAGNOSIS — R1012 Left upper quadrant pain: Secondary | ICD-10-CM | POA: Diagnosis not present

## 2018-05-28 DIAGNOSIS — E113312 Type 2 diabetes mellitus with moderate nonproliferative diabetic retinopathy with macular edema, left eye: Secondary | ICD-10-CM | POA: Diagnosis not present

## 2018-05-29 MED FILL — LOSARTAN POTASSIUM 100 MG T: 100 | 90 days supply | Qty: 90 | Fill #1

## 2018-06-03 MED FILL — LANTUS SOLOSTAR 100 UNITS/M: 100 | 90 days supply | Qty: 90 | Fill #0

## 2018-06-15 MED FILL — SIMVASTATIN 20 MG TABLET: 20 | 90 days supply | Qty: 90 | Fill #1

## 2018-06-22 DIAGNOSIS — E113313 Type 2 diabetes mellitus with moderate nonproliferative diabetic retinopathy with macular edema, bilateral: Secondary | ICD-10-CM | POA: Diagnosis not present

## 2018-06-22 DIAGNOSIS — E113312 Type 2 diabetes mellitus with moderate nonproliferative diabetic retinopathy with macular edema, left eye: Secondary | ICD-10-CM | POA: Diagnosis not present

## 2018-06-25 DIAGNOSIS — E663 Overweight: Secondary | ICD-10-CM | POA: Diagnosis not present

## 2018-06-25 DIAGNOSIS — Z1389 Encounter for screening for other disorder: Secondary | ICD-10-CM | POA: Diagnosis not present

## 2018-06-25 DIAGNOSIS — Z8619 Personal history of other infectious and parasitic diseases: Secondary | ICD-10-CM | POA: Diagnosis not present

## 2018-06-25 DIAGNOSIS — I700 Atherosclerosis of aorta: Secondary | ICD-10-CM | POA: Diagnosis not present

## 2018-06-25 DIAGNOSIS — E7800 Pure hypercholesterolemia, unspecified: Secondary | ICD-10-CM | POA: Diagnosis not present

## 2018-06-25 DIAGNOSIS — E1140 Type 2 diabetes mellitus with diabetic neuropathy, unspecified: Secondary | ICD-10-CM | POA: Diagnosis not present

## 2018-06-25 DIAGNOSIS — M545 Low back pain: Secondary | ICD-10-CM | POA: Diagnosis not present

## 2018-06-25 DIAGNOSIS — Z0000 Encounter for general adult medical examination without abnormal findings: Secondary | ICD-10-CM | POA: Diagnosis not present

## 2018-06-25 DIAGNOSIS — I10 Essential (primary) hypertension: Secondary | ICD-10-CM | POA: Diagnosis not present

## 2018-07-10 DIAGNOSIS — K8010 Calculus of gallbladder with chronic cholecystitis without obstruction: Secondary | ICD-10-CM | POA: Diagnosis not present

## 2018-07-10 DIAGNOSIS — R1012 Left upper quadrant pain: Secondary | ICD-10-CM | POA: Diagnosis not present

## 2018-07-13 MED FILL — SILDENAFIL CITRATE 100 MG T: 100 | 30 days supply | Qty: 4 | Fill #2

## 2018-07-27 DIAGNOSIS — E113313 Type 2 diabetes mellitus with moderate nonproliferative diabetic retinopathy with macular edema, bilateral: Secondary | ICD-10-CM | POA: Diagnosis not present

## 2018-07-27 DIAGNOSIS — E113312 Type 2 diabetes mellitus with moderate nonproliferative diabetic retinopathy with macular edema, left eye: Secondary | ICD-10-CM | POA: Diagnosis not present

## 2018-07-27 DIAGNOSIS — E113311 Type 2 diabetes mellitus with moderate nonproliferative diabetic retinopathy with macular edema, right eye: Secondary | ICD-10-CM | POA: Diagnosis not present

## 2018-07-30 DIAGNOSIS — E109 Type 1 diabetes mellitus without complications: Secondary | ICD-10-CM | POA: Diagnosis not present

## 2018-07-30 DIAGNOSIS — B182 Chronic viral hepatitis C: Secondary | ICD-10-CM | POA: Diagnosis not present

## 2018-07-30 DIAGNOSIS — M519 Unspecified thoracic, thoracolumbar and lumbosacral intervertebral disc disorder: Secondary | ICD-10-CM | POA: Diagnosis not present

## 2018-07-30 DIAGNOSIS — I10 Essential (primary) hypertension: Secondary | ICD-10-CM | POA: Diagnosis not present

## 2018-07-30 DIAGNOSIS — M48061 Spinal stenosis, lumbar region without neurogenic claudication: Secondary | ICD-10-CM | POA: Diagnosis not present

## 2018-07-30 DIAGNOSIS — M546 Pain in thoracic spine: Secondary | ICD-10-CM | POA: Diagnosis not present

## 2018-08-03 MED FILL — metFORMIN HCL 1000 MG TABS: 1000 | 90 days supply | Qty: 180 | Fill #0

## 2018-08-03 MED FILL — glipiZIDE 5 MG TABS: 5 | 90 days supply | Qty: 90 | Fill #0

## 2018-08-06 DIAGNOSIS — E113312 Type 2 diabetes mellitus with moderate nonproliferative diabetic retinopathy with macular edema, left eye: Secondary | ICD-10-CM | POA: Diagnosis not present

## 2018-08-07 MED FILL — LATANOPROST 0.005% EYE DRP: 0.005 | 25 days supply | Qty: 3 | Fill #0

## 2018-08-07 MED FILL — SIMBRINZA 1%-0.2% EYE DROPS: 1-0.2 | 80 days supply | Qty: 8 | Fill #0

## 2018-08-17 DIAGNOSIS — R109 Unspecified abdominal pain: Secondary | ICD-10-CM | POA: Diagnosis not present

## 2018-08-21 MED FILL — METOPROLOL SUCCINATE ER 25: 25 | 90 days supply | Qty: 90 | Fill #0

## 2018-08-26 DIAGNOSIS — M5134 Other intervertebral disc degeneration, thoracic region: Secondary | ICD-10-CM | POA: Diagnosis not present

## 2018-08-26 DIAGNOSIS — M546 Pain in thoracic spine: Secondary | ICD-10-CM | POA: Diagnosis not present

## 2018-08-26 MED FILL — IBUPROFEN 800 MG TAB: 800 | 30 days supply | Qty: 30 | Fill #0

## 2018-08-28 DIAGNOSIS — H43813 Vitreous degeneration, bilateral: Secondary | ICD-10-CM | POA: Diagnosis not present

## 2018-08-28 DIAGNOSIS — H35033 Hypertensive retinopathy, bilateral: Secondary | ICD-10-CM | POA: Diagnosis not present

## 2018-08-28 DIAGNOSIS — E113311 Type 2 diabetes mellitus with moderate nonproliferative diabetic retinopathy with macular edema, right eye: Secondary | ICD-10-CM | POA: Diagnosis not present

## 2018-08-28 DIAGNOSIS — H3582 Retinal ischemia: Secondary | ICD-10-CM | POA: Diagnosis not present

## 2018-08-31 MED FILL — LOSARTAN POTASSIUM 100 MG T: 100 | 90 days supply | Qty: 90 | Fill #2

## 2018-09-01 MED FILL — SILDENAFIL CITRATE 100 MG T: 100 | 30 days supply | Qty: 4 | Fill #3

## 2018-09-03 DIAGNOSIS — E113313 Type 2 diabetes mellitus with moderate nonproliferative diabetic retinopathy with macular edema, bilateral: Secondary | ICD-10-CM | POA: Diagnosis not present

## 2018-09-03 DIAGNOSIS — E113312 Type 2 diabetes mellitus with moderate nonproliferative diabetic retinopathy with macular edema, left eye: Secondary | ICD-10-CM | POA: Diagnosis not present

## 2018-09-10 MED FILL — LANTUS SOLOSTAR 100 UNITS/M: 100 | 90 days supply | Qty: 90 | Fill #1

## 2018-09-15 MED FILL — SIMVASTATIN 20 MG TABLET: 20 | 90 days supply | Qty: 90 | Fill #2

## 2018-09-17 DIAGNOSIS — H35033 Hypertensive retinopathy, bilateral: Secondary | ICD-10-CM | POA: Diagnosis not present

## 2018-09-17 DIAGNOSIS — H43813 Vitreous degeneration, bilateral: Secondary | ICD-10-CM | POA: Diagnosis not present

## 2018-09-17 DIAGNOSIS — E113311 Type 2 diabetes mellitus with moderate nonproliferative diabetic retinopathy with macular edema, right eye: Secondary | ICD-10-CM | POA: Diagnosis not present

## 2018-09-17 DIAGNOSIS — E113313 Type 2 diabetes mellitus with moderate nonproliferative diabetic retinopathy with macular edema, bilateral: Secondary | ICD-10-CM | POA: Diagnosis not present

## 2018-09-17 DIAGNOSIS — H3582 Retinal ischemia: Secondary | ICD-10-CM | POA: Diagnosis not present

## 2018-09-28 MED FILL — GABAPENTIN 300 MG CAPSULE: 300 | 30 days supply | Qty: 90 | Fill #1 | Status: TO

## 2018-10-01 DIAGNOSIS — E113312 Type 2 diabetes mellitus with moderate nonproliferative diabetic retinopathy with macular edema, left eye: Secondary | ICD-10-CM | POA: Diagnosis not present

## 2018-10-09 MED FILL — SILDENAFIL CITRATE 100 MG T: 100 | 30 days supply | Qty: 4 | Fill #4 | Status: TO

## 2018-10-29 DIAGNOSIS — E113313 Type 2 diabetes mellitus with moderate nonproliferative diabetic retinopathy with macular edema, bilateral: Secondary | ICD-10-CM | POA: Diagnosis not present

## 2018-10-29 DIAGNOSIS — H35033 Hypertensive retinopathy, bilateral: Secondary | ICD-10-CM | POA: Diagnosis not present

## 2018-10-29 DIAGNOSIS — H3582 Retinal ischemia: Secondary | ICD-10-CM | POA: Diagnosis not present

## 2018-10-29 DIAGNOSIS — H43813 Vitreous degeneration, bilateral: Secondary | ICD-10-CM | POA: Diagnosis not present

## 2018-10-29 DIAGNOSIS — E113312 Type 2 diabetes mellitus with moderate nonproliferative diabetic retinopathy with macular edema, left eye: Secondary | ICD-10-CM | POA: Diagnosis not present

## 2018-11-02 MED FILL — glipiZIDE 5 MG TABS: 5 | 90 days supply | Qty: 90 | Fill #1

## 2018-11-12 DIAGNOSIS — E113311 Type 2 diabetes mellitus with moderate nonproliferative diabetic retinopathy with macular edema, right eye: Secondary | ICD-10-CM | POA: Diagnosis not present

## 2018-11-12 DIAGNOSIS — E113313 Type 2 diabetes mellitus with moderate nonproliferative diabetic retinopathy with macular edema, bilateral: Secondary | ICD-10-CM | POA: Diagnosis not present

## 2018-11-12 DIAGNOSIS — E113312 Type 2 diabetes mellitus with moderate nonproliferative diabetic retinopathy with macular edema, left eye: Secondary | ICD-10-CM | POA: Diagnosis not present

## 2018-11-18 MED FILL — METOPROLOL SUCCINATE ER 25: 25 | 90 days supply | Qty: 90 | Fill #0

## 2018-11-25 MED FILL — LOSARTAN POTASSIUM 100 MG T: 100 | 30 days supply | Qty: 30 | Fill #0

## 2018-11-26 DIAGNOSIS — E113313 Type 2 diabetes mellitus with moderate nonproliferative diabetic retinopathy with macular edema, bilateral: Secondary | ICD-10-CM | POA: Diagnosis not present

## 2018-11-26 DIAGNOSIS — E113312 Type 2 diabetes mellitus with moderate nonproliferative diabetic retinopathy with macular edema, left eye: Secondary | ICD-10-CM | POA: Diagnosis not present

## 2018-11-26 MED FILL — LATANOPROST 0.005% EYE DRP: 0.005 | 25 days supply | Qty: 3 | Fill #0

## 2018-11-26 MED FILL — LANTUS SOLOSTAR 100 UNITS/M: 100 | 90 days supply | Qty: 90 | Fill #0

## 2018-11-26 MED FILL — SILDENAFIL CITRATE 100 MG T: 100 | 30 days supply | Qty: 4 | Fill #0

## 2018-12-04 MED FILL — OMEGA-3 ETHYL ESTERS 1 GM C: 1 | 90 days supply | Qty: 180 | Fill #0

## 2018-12-12 IMAGING — CR DG LUMBAR SPINE 2-3V
3 series · 3 of 3 positions shown · non-contrast
Comparison: Radiographs October 22, 2017.

CLINICAL DATA: Micro lumbar decompression at multiple levels.

EXAM:
LUMBAR SPINE - 2-3 VIEW

[xtable lateral (1 of 2)]
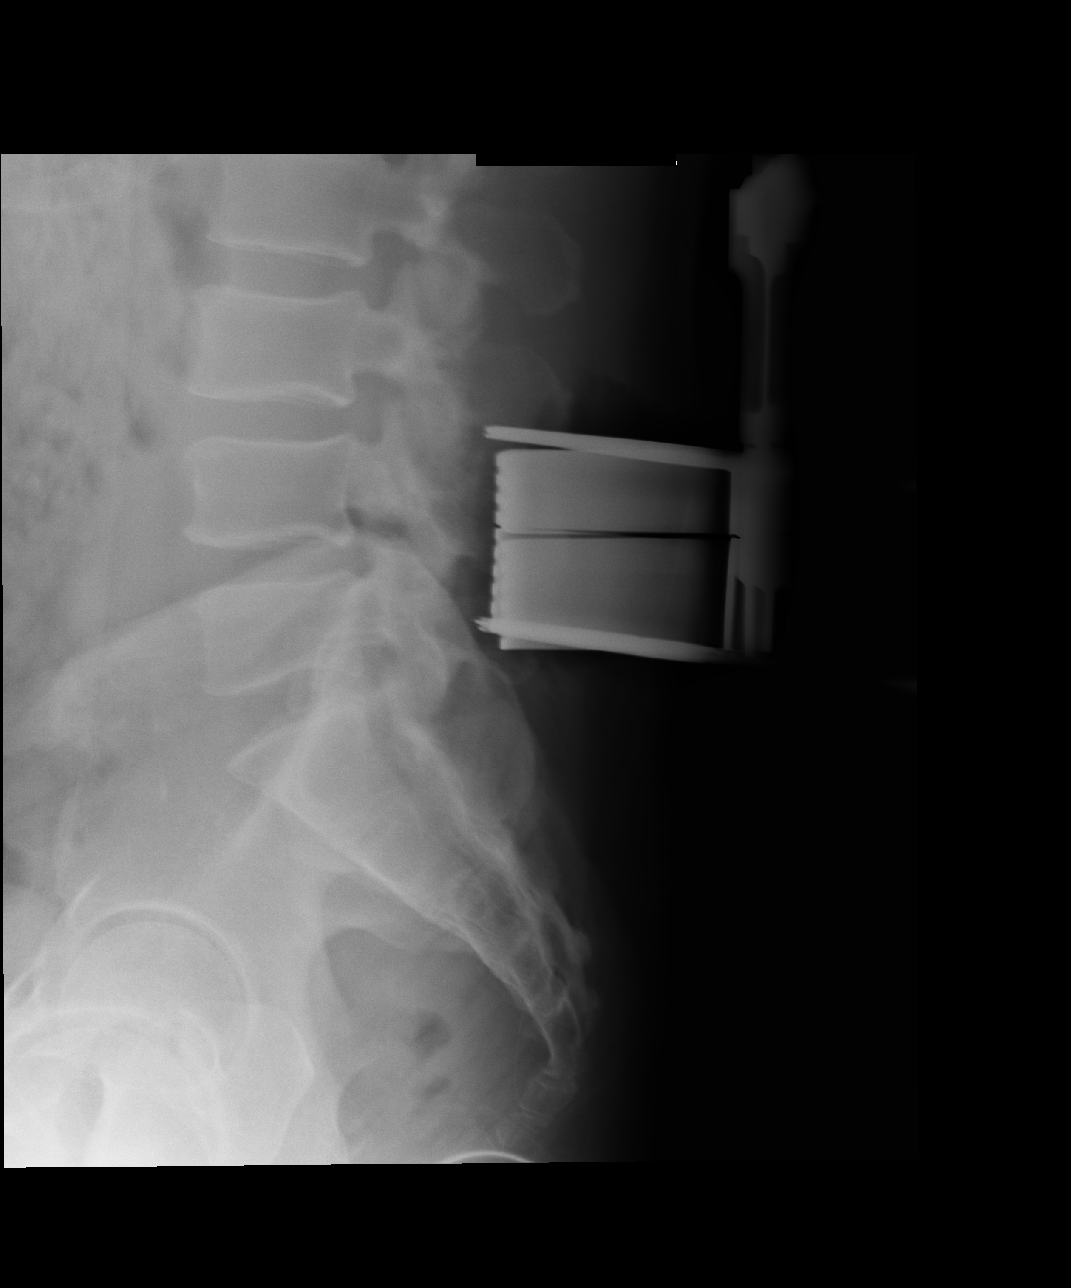

[AP]
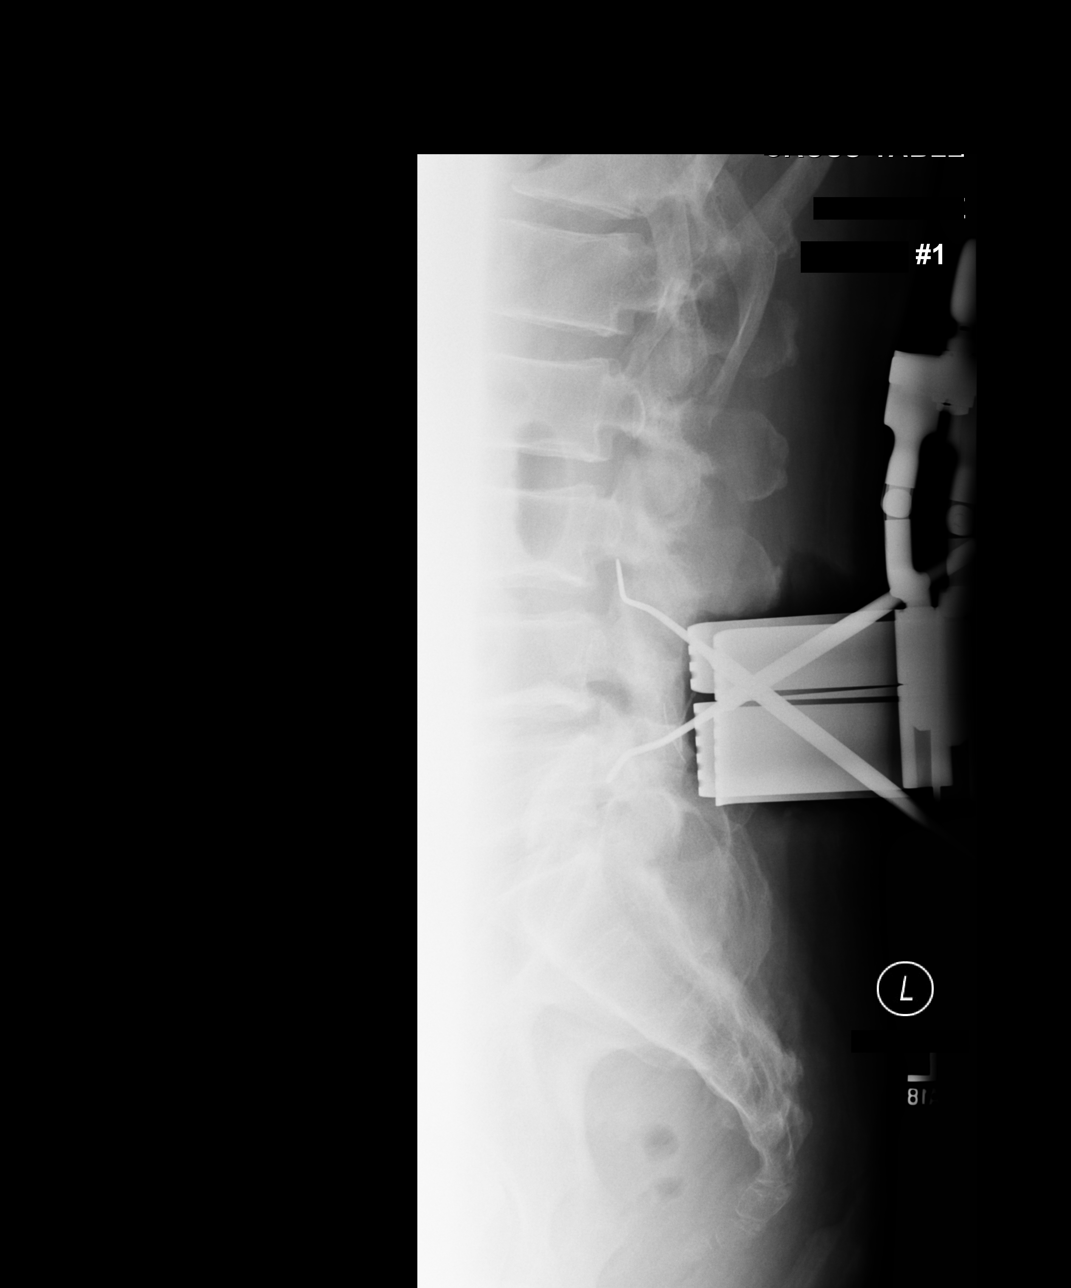

[xtable lateral (2 of 2)]
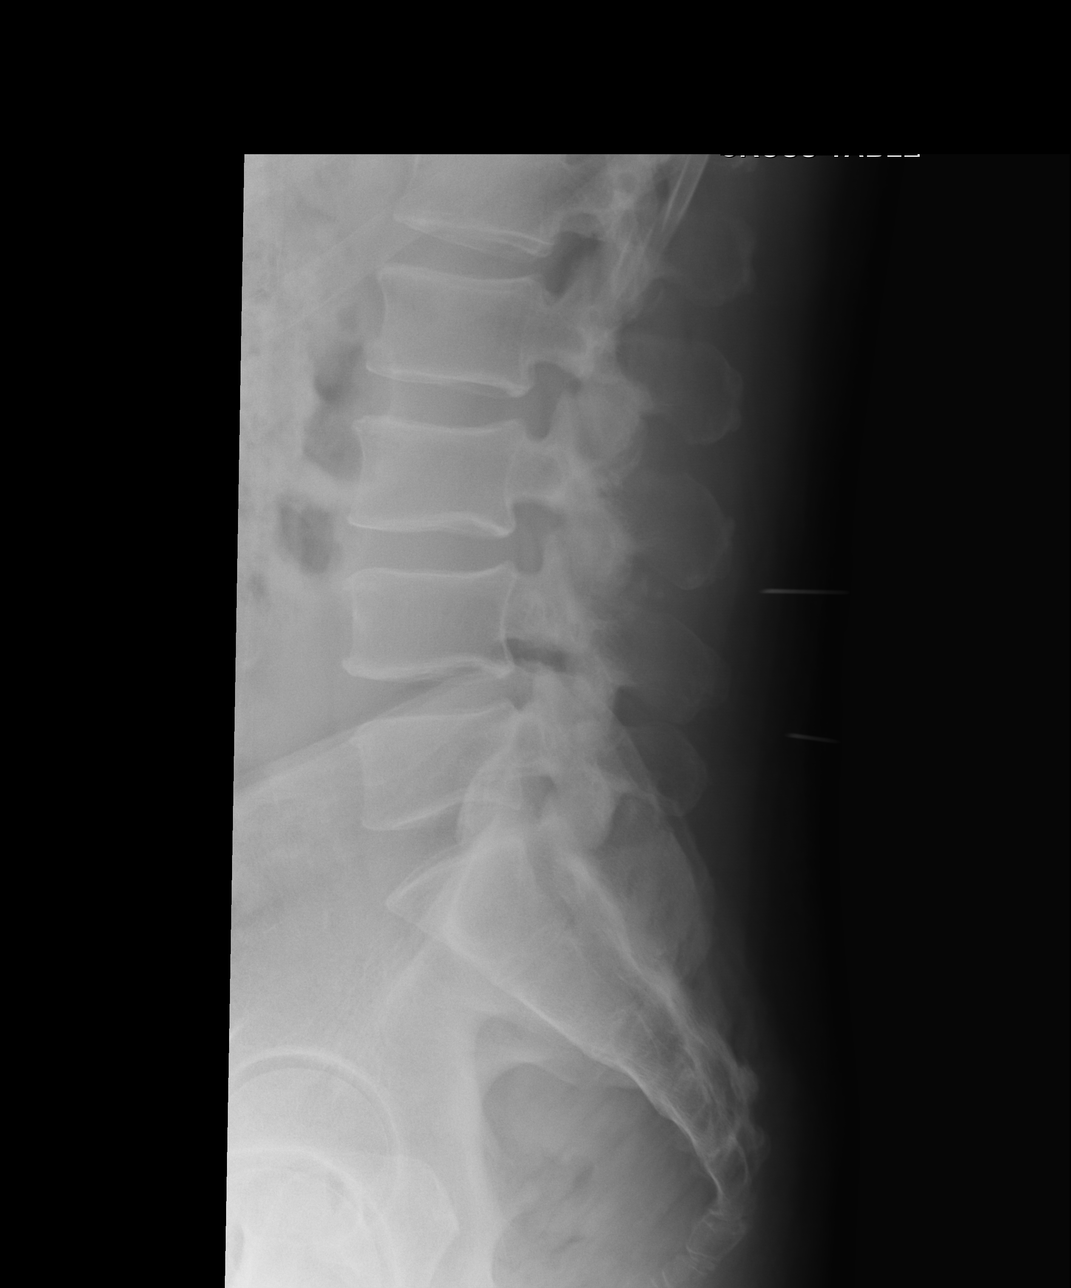

[3 of 3 positions shown; findings below may reference images not displayed]

FINDINGS: Three intraoperative cross-table lateral projections of the lumbar
spine were obtained. The first image demonstrates surgical probes
directed toward the posterior interspinous spaces of L3-4 and L4-5.
The second image demonstrates surgical retractor posterior to L4 and
L5 vertebral bodies. Surgical probe is seen projected over the L3
and L5 posterior spinous processes. The third image demonstrates
surgical probes positioned over the posterior elements of L3 and L5.
IMPRESSION: Surgical localization as described above.

## 2018-12-16 MED FILL — SIMVASTATIN 20 MG TABLET: 20 | 90 days supply | Qty: 90 | Fill #0

## 2018-12-16 MED FILL — GABAPENTIN 300 MG CAPSULE: 300 | 90 days supply | Qty: 90 | Fill #0

## 2018-12-28 DIAGNOSIS — E113312 Type 2 diabetes mellitus with moderate nonproliferative diabetic retinopathy with macular edema, left eye: Secondary | ICD-10-CM | POA: Diagnosis not present

## 2018-12-28 MED FILL — LOSARTAN POTASSIUM 100 MG T: 100 | 30 days supply | Qty: 30 | Fill #1 | Status: TO

## 2018-12-29 DIAGNOSIS — E7800 Pure hypercholesterolemia, unspecified: Secondary | ICD-10-CM | POA: Diagnosis not present

## 2018-12-29 DIAGNOSIS — I10 Essential (primary) hypertension: Secondary | ICD-10-CM | POA: Diagnosis not present

## 2018-12-29 DIAGNOSIS — E663 Overweight: Secondary | ICD-10-CM | POA: Diagnosis not present

## 2018-12-29 DIAGNOSIS — I700 Atherosclerosis of aorta: Secondary | ICD-10-CM | POA: Diagnosis not present

## 2018-12-29 DIAGNOSIS — E1140 Type 2 diabetes mellitus with diabetic neuropathy, unspecified: Secondary | ICD-10-CM | POA: Diagnosis not present

## 2019-01-05 DIAGNOSIS — I10 Essential (primary) hypertension: Secondary | ICD-10-CM | POA: Diagnosis not present

## 2019-01-05 DIAGNOSIS — E7800 Pure hypercholesterolemia, unspecified: Secondary | ICD-10-CM | POA: Diagnosis not present

## 2019-01-05 DIAGNOSIS — E1140 Type 2 diabetes mellitus with diabetic neuropathy, unspecified: Secondary | ICD-10-CM | POA: Diagnosis not present

## 2019-01-14 DIAGNOSIS — E113311 Type 2 diabetes mellitus with moderate nonproliferative diabetic retinopathy with macular edema, right eye: Secondary | ICD-10-CM | POA: Diagnosis not present

## 2019-01-15 MED FILL — metFORMIN HCL 1000 MG TABS: 1000 | 90 days supply | Qty: 180 | Fill #1

## 2019-01-25 MED FILL — LOSARTAN POTASSIUM 100 MG T: 100 | 30 days supply | Qty: 30 | Fill #0

## 2019-01-28 DIAGNOSIS — E113313 Type 2 diabetes mellitus with moderate nonproliferative diabetic retinopathy with macular edema, bilateral: Secondary | ICD-10-CM | POA: Diagnosis not present

## 2019-01-28 DIAGNOSIS — E113312 Type 2 diabetes mellitus with moderate nonproliferative diabetic retinopathy with macular edema, left eye: Secondary | ICD-10-CM | POA: Diagnosis not present

## 2019-02-03 MED FILL — glipiZIDE 5 MG TABS: 5 | 90 days supply | Qty: 90 | Fill #2

## 2019-02-16 MED FILL — METOPROLOL SUCCINATE ER 25: 25 | 90 days supply | Qty: 90 | Fill #0

## 2019-02-16 MED FILL — SILDENAFIL CITRATE 100 MG T: 100 | 30 days supply | Qty: 4 | Fill #0

## 2019-02-22 MED FILL — LANTUS SOLOSTAR 100 UNITS/M: 100 | 90 days supply | Qty: 90 | Fill #0

## 2019-02-25 MED FILL — LOSARTAN POTASSIUM 100 MG T: 100 | 90 days supply | Qty: 90 | Fill #0

## 2019-03-01 DIAGNOSIS — H35033 Hypertensive retinopathy, bilateral: Secondary | ICD-10-CM | POA: Diagnosis not present

## 2019-03-01 DIAGNOSIS — E113313 Type 2 diabetes mellitus with moderate nonproliferative diabetic retinopathy with macular edema, bilateral: Secondary | ICD-10-CM | POA: Diagnosis not present

## 2019-03-01 DIAGNOSIS — H43813 Vitreous degeneration, bilateral: Secondary | ICD-10-CM | POA: Diagnosis not present

## 2019-03-01 DIAGNOSIS — H3582 Retinal ischemia: Secondary | ICD-10-CM | POA: Diagnosis not present

## 2019-03-01 DIAGNOSIS — E113312 Type 2 diabetes mellitus with moderate nonproliferative diabetic retinopathy with macular edema, left eye: Secondary | ICD-10-CM | POA: Diagnosis not present

## 2019-03-03 DIAGNOSIS — H25813 Combined forms of age-related cataract, bilateral: Secondary | ICD-10-CM | POA: Diagnosis not present

## 2019-03-03 DIAGNOSIS — E113213 Type 2 diabetes mellitus with mild nonproliferative diabetic retinopathy with macular edema, bilateral: Secondary | ICD-10-CM | POA: Diagnosis not present

## 2019-03-12 MED FILL — SIMVASTATIN 20 MG TABLET: 20 | 90 days supply | Qty: 90 | Fill #0

## 2019-03-12 MED FILL — GABAPENTIN 300 MG CAPSULE: 300 | 90 days supply | Qty: 90 | Fill #0

## 2019-03-18 DIAGNOSIS — E113313 Type 2 diabetes mellitus with moderate nonproliferative diabetic retinopathy with macular edema, bilateral: Secondary | ICD-10-CM | POA: Diagnosis not present

## 2019-03-18 DIAGNOSIS — E113311 Type 2 diabetes mellitus with moderate nonproliferative diabetic retinopathy with macular edema, right eye: Secondary | ICD-10-CM | POA: Diagnosis not present

## 2019-04-05 DIAGNOSIS — E113312 Type 2 diabetes mellitus with moderate nonproliferative diabetic retinopathy with macular edema, left eye: Secondary | ICD-10-CM | POA: Diagnosis not present

## 2019-04-05 DIAGNOSIS — E113313 Type 2 diabetes mellitus with moderate nonproliferative diabetic retinopathy with macular edema, bilateral: Secondary | ICD-10-CM | POA: Diagnosis not present

## 2019-05-03 MED FILL — glipiZIDE 5 MG TABS: 5 | 90 days supply | Qty: 90 | Fill #3

## 2019-05-13 DIAGNOSIS — E113312 Type 2 diabetes mellitus with moderate nonproliferative diabetic retinopathy with macular edema, left eye: Secondary | ICD-10-CM | POA: Diagnosis not present

## 2019-05-13 MED FILL — LANTUS SOLOSTAR 100 UNITS/M: 100 | 81 days supply | Qty: 90 | Fill #0

## 2019-05-18 MED FILL — METOPROLOL SUCCINATE ER 25: 25 | 90 days supply | Qty: 90 | Fill #0

## 2019-05-27 DIAGNOSIS — E113312 Type 2 diabetes mellitus with moderate nonproliferative diabetic retinopathy with macular edema, left eye: Secondary | ICD-10-CM | POA: Diagnosis not present

## 2019-05-27 DIAGNOSIS — E113313 Type 2 diabetes mellitus with moderate nonproliferative diabetic retinopathy with macular edema, bilateral: Secondary | ICD-10-CM | POA: Diagnosis not present

## 2019-06-14 MED FILL — OMEGA-3 ETHYL ESTERS 1 GM C: 1 | 90 days supply | Qty: 180 | Fill #0

## 2019-06-14 MED FILL — LOSARTAN POTASSIUM 100 MG T: 100 | 90 days supply | Qty: 90 | Fill #1

## 2019-06-14 MED FILL — SILDENAFIL CITRATE 100 MG T: 100 | 4 days supply | Qty: 4 | Fill #0

## 2019-06-14 MED FILL — SIMVASTATIN 20 MG TABLET: 20 | 90 days supply | Qty: 90 | Fill #0

## 2019-06-24 DIAGNOSIS — H35033 Hypertensive retinopathy, bilateral: Secondary | ICD-10-CM | POA: Diagnosis not present

## 2019-06-24 DIAGNOSIS — E113311 Type 2 diabetes mellitus with moderate nonproliferative diabetic retinopathy with macular edema, right eye: Secondary | ICD-10-CM | POA: Diagnosis not present

## 2019-06-24 DIAGNOSIS — H3582 Retinal ischemia: Secondary | ICD-10-CM | POA: Diagnosis not present

## 2019-06-24 DIAGNOSIS — E113313 Type 2 diabetes mellitus with moderate nonproliferative diabetic retinopathy with macular edema, bilateral: Secondary | ICD-10-CM | POA: Diagnosis not present

## 2019-06-24 DIAGNOSIS — H43813 Vitreous degeneration, bilateral: Secondary | ICD-10-CM | POA: Diagnosis not present

## 2019-07-02 DIAGNOSIS — Z20828 Contact with and (suspected) exposure to other viral communicable diseases: Secondary | ICD-10-CM | POA: Diagnosis not present

## 2019-07-19 DIAGNOSIS — E113313 Type 2 diabetes mellitus with moderate nonproliferative diabetic retinopathy with macular edema, bilateral: Secondary | ICD-10-CM | POA: Diagnosis not present

## 2019-07-19 DIAGNOSIS — E113312 Type 2 diabetes mellitus with moderate nonproliferative diabetic retinopathy with macular edema, left eye: Secondary | ICD-10-CM | POA: Diagnosis not present

## 2019-07-19 MED FILL — SILDENAFIL CITRATE 100 MG T: 100 | 29 days supply | Qty: 4 | Fill #1

## 2019-07-20 MED FILL — metFORMIN HCL 1000 MG TABS: 1000 | 90 days supply | Qty: 180 | Fill #2

## 2019-07-27 MED FILL — LOSARTAN-HCTZ 100-12.5 MG T: 100-12.5 | 30 days supply | Qty: 30 | Fill #0

## 2019-07-27 MED FILL — TRULICITY 1.5 MG/0.5 ML PEN: 1.5 | 28 days supply | Qty: 2 | Fill #0

## 2019-08-09 MED FILL — glipiZIDE 5 MG TABS: 5 | 90 days supply | Qty: 90 | Fill #0

## 2019-08-19 MED FILL — METOPROLOL SUCCINATE ER 25: 25 | 90 days supply | Qty: 90 | Fill #1

## 2019-08-25 ENCOUNTER — Encounter: Payer: Self-pay | Admitting: Podiatry

## 2019-08-25 ENCOUNTER — Other Ambulatory Visit: Payer: Self-pay

## 2019-08-25 ENCOUNTER — Ambulatory Visit (INDEPENDENT_AMBULATORY_CARE_PROVIDER_SITE_OTHER): Payer: Managed Care, Other (non HMO) | Admitting: Podiatry

## 2019-08-25 VITALS — BP 122/76 | HR 82 | Temp 97.6°F

## 2019-08-25 DIAGNOSIS — M79675 Pain in left toe(s): Secondary | ICD-10-CM

## 2019-08-25 DIAGNOSIS — E1140 Type 2 diabetes mellitus with diabetic neuropathy, unspecified: Secondary | ICD-10-CM

## 2019-08-25 DIAGNOSIS — B351 Tinea unguium: Secondary | ICD-10-CM

## 2019-08-25 DIAGNOSIS — E1149 Type 2 diabetes mellitus with other diabetic neurological complication: Secondary | ICD-10-CM | POA: Diagnosis not present

## 2019-08-25 DIAGNOSIS — M79674 Pain in right toe(s): Secondary | ICD-10-CM

## 2019-08-27 MED FILL — LOSARTAN-HCTZ 100-12.5 MG T: 100-12.5 | 30 days supply | Qty: 30 | Fill #1

## 2019-08-30 NOTE — Progress Notes (Signed)
Subjective:   Patient ID: Timothy Peters, male   DOB: 69 y.o.   MRN: [id number]   HPI Patient presents with all of his nails are thickened and dystrophic and he cannot take care of them and he does have poor health with neuropathy and cannot do this himself.  He is tried Lamisil and no treatment in the past and does not smoke and is not active currently   Review of Systems  All other systems reviewed and are negative.       Objective:  Physical Exam Vitals and nursing note reviewed.  Constitutional:      Appearance: He is well-developed.  Pulmonary:     Effort: Pulmonary effort is normal.  Musculoskeletal:        General: Normal range of motion.  Skin:    General: Skin is warm.  Neurological:     Mental Status: He is alert.     Neurovascular status was found to be intact with muscle strength adequate range of motion mildly reduced subtalar midtarsal joint with thick yellow brittle nailbeds 1-5 both feet that are moderately painful when pressed from the dorsal direction     Assessment:  Chronic mycotic nail infection 1-5 both feet with pain     Plan:  Debridement nailbeds 1-5 both feet with no iatrogenic bleeding and will continue this routine treatment on a 60-month basis which I think will keep him in good shape

## 2019-09-16 MED FILL — SIMVASTATIN 20 MG TABLET: 20 | 90 days supply | Qty: 90 | Fill #1

## 2019-09-27 MED FILL — LOSARTAN-HCTZ 100-12.5 MG T: 100-12.5 | 30 days supply | Qty: 30 | Fill #2

## 2019-10-11 MED FILL — LANTUS SOLOSTAR 100 UNITS/M: 100 | 27 days supply | Qty: 30 | Fill #0

## 2019-10-11 MED FILL — TRULICITY 1.5 MG/0.5 ML PEN: 1.5 | 28 days supply | Qty: 2 | Fill #1

## 2019-10-14 DIAGNOSIS — E113313 Type 2 diabetes mellitus with moderate nonproliferative diabetic retinopathy with macular edema, bilateral: Secondary | ICD-10-CM | POA: Diagnosis not present

## 2019-10-14 DIAGNOSIS — H43813 Vitreous degeneration, bilateral: Secondary | ICD-10-CM | POA: Diagnosis not present

## 2019-10-14 DIAGNOSIS — H35033 Hypertensive retinopathy, bilateral: Secondary | ICD-10-CM | POA: Diagnosis not present

## 2019-10-14 DIAGNOSIS — H3582 Retinal ischemia: Secondary | ICD-10-CM | POA: Diagnosis not present

## 2019-10-14 DIAGNOSIS — E113311 Type 2 diabetes mellitus with moderate nonproliferative diabetic retinopathy with macular edema, right eye: Secondary | ICD-10-CM | POA: Diagnosis not present

## 2019-10-27 MED FILL — LOSARTAN-HCTZ 100-12.5 MG T: 100-12.5 | 30 days supply | Qty: 30 | Fill #3

## 2019-10-28 DIAGNOSIS — E113512 Type 2 diabetes mellitus with proliferative diabetic retinopathy with macular edema, left eye: Secondary | ICD-10-CM | POA: Diagnosis not present

## 2019-10-28 DIAGNOSIS — E113513 Type 2 diabetes mellitus with proliferative diabetic retinopathy with macular edema, bilateral: Secondary | ICD-10-CM | POA: Diagnosis not present

## 2019-11-05 MED FILL — glipiZIDE 5 MG TABS: 5 | 90 days supply | Qty: 90 | Fill #1

## 2019-11-08 MED FILL — TRULICITY 1.5 MG/0.5 ML PEN: 1.5 | 28 days supply | Qty: 2 | Fill #2

## 2019-11-15 DIAGNOSIS — E782 Mixed hyperlipidemia: Secondary | ICD-10-CM | POA: Diagnosis not present

## 2019-11-15 DIAGNOSIS — M48061 Spinal stenosis, lumbar region without neurogenic claudication: Secondary | ICD-10-CM | POA: Diagnosis not present

## 2019-11-15 DIAGNOSIS — I10 Essential (primary) hypertension: Secondary | ICD-10-CM | POA: Diagnosis not present

## 2019-11-15 DIAGNOSIS — E1140 Type 2 diabetes mellitus with diabetic neuropathy, unspecified: Secondary | ICD-10-CM | POA: Diagnosis not present

## 2019-11-15 MED FILL — GABAPENTIN 300 MG CAPSULE: 300 | 30 days supply | Qty: 30 | Fill #0

## 2019-11-18 MED FILL — METOPROLOL SUCCINATE ER 25: 25 | 90 days supply | Qty: 90 | Fill #2

## 2019-11-29 MED FILL — LOSARTAN-HCTZ 100-12.5 MG T: 100-12.5 | 30 days supply | Qty: 30 | Fill #4

## 2019-12-10 MED FILL — TRULICITY 1.5 MG/0.5 ML PEN: 1.5 | 28 days supply | Qty: 2 | Fill #3

## 2019-12-21 MED FILL — BASAGLAR 100 UNIT/ML KWIKPE: 100 | 90 days supply | Qty: 36 | Fill #0

## 2019-12-21 MED FILL — SIMVASTATIN 20 MG TABLET: 20 | 90 days supply | Qty: 90 | Fill #0

## 2020-01-07 MED FILL — LOSARTAN-HCTZ 100-12.5 MG T: 100-12.5 | 30 days supply | Qty: 30 | Fill #5

## 2020-01-19 MED FILL — TRULICITY 1.5 MG/0.5 ML PEN: 1.5 | 28 days supply | Qty: 2 | Fill #4

## 2020-02-10 MED FILL — LOSARTAN-HCTZ 100-12.5 MG T: 100-12.5 | 30 days supply | Qty: 30 | Fill #6

## 2020-02-17 ENCOUNTER — Other Ambulatory Visit (HOSPITAL_COMMUNITY): Payer: Self-pay | Admitting: Internal Medicine

## 2020-02-17 MED FILL — TRULICITY 1.5 MG/0.5 ML PEN: 1.5 | 28 days supply | Qty: 2 | Fill #5

## 2020-02-17 MED FILL — glipiZIDE 5 MG TABS: 5 | 90 days supply | Qty: 90 | Fill #0

## 2020-03-06 ENCOUNTER — Other Ambulatory Visit (HOSPITAL_COMMUNITY): Payer: Self-pay | Admitting: Internal Medicine

## 2020-03-06 MED FILL — METOPROLOL SUCCINATE ER 25: 25 | 90 days supply | Qty: 90 | Fill #0

## 2020-03-15 MED FILL — BASAGLAR 100 UNIT/ML KWIKPE: 100 | 90 days supply | Qty: 36 | Fill #1

## 2020-03-15 MED FILL — TRULICITY 1.5 MG/0.5 ML PEN: 1.5 | 28 days supply | Qty: 2 | Fill #6

## 2020-03-15 MED FILL — GABAPENTIN 300 MG CAPSULE: 300 | 30 days supply | Qty: 60 | Fill #0

## 2020-03-28 DIAGNOSIS — H2513 Age-related nuclear cataract, bilateral: Secondary | ICD-10-CM | POA: Diagnosis not present

## 2020-03-28 DIAGNOSIS — H524 Presbyopia: Secondary | ICD-10-CM | POA: Diagnosis not present

## 2020-03-28 MED FILL — GABAPENTIN 300 MG CAPSULE: 300 | 30 days supply | Qty: 60 | Fill #0

## 2020-04-11 MED FILL — SIMVASTATIN 20 MG TABLET: 20 | 90 days supply | Qty: 90 | Fill #0

## 2020-04-13 MED FILL — TRULICITY 1.5 MG/0.5 ML PEN: 1.5 | 28 days supply | Qty: 2 | Fill #7

## 2020-05-14 ENCOUNTER — Emergency Department (HOSPITAL_COMMUNITY): Payer: Managed Care, Other (non HMO)

## 2020-05-14 ENCOUNTER — Encounter (HOSPITAL_COMMUNITY): Payer: Self-pay | Admitting: *Deleted

## 2020-05-14 ENCOUNTER — Other Ambulatory Visit: Payer: Self-pay

## 2020-05-14 ENCOUNTER — Observation Stay (HOSPITAL_COMMUNITY)
Admission: EM | Admit: 2020-05-14 | Discharge: 2020-05-16 | Disposition: A | Discharge status: Home / Self Care | Payer: Managed Care, Other (non HMO) | Attending: Family Medicine | Admitting: Family Medicine

## 2020-05-14 DIAGNOSIS — E1142 Type 2 diabetes mellitus with diabetic polyneuropathy: Secondary | ICD-10-CM | POA: Diagnosis present

## 2020-05-14 DIAGNOSIS — Y92410 Unspecified street and highway as the place of occurrence of the external cause: Secondary | ICD-10-CM | POA: Insufficient documentation

## 2020-05-14 DIAGNOSIS — Y93I9 Activity, other involving external motion: Secondary | ICD-10-CM | POA: Insufficient documentation

## 2020-05-14 DIAGNOSIS — I10 Essential (primary) hypertension: Secondary | ICD-10-CM | POA: Diagnosis present

## 2020-05-14 DIAGNOSIS — Z794 Long term (current) use of insulin: Secondary | ICD-10-CM | POA: Diagnosis not present

## 2020-05-14 DIAGNOSIS — E119 Type 2 diabetes mellitus without complications: Secondary | ICD-10-CM

## 2020-05-14 DIAGNOSIS — S299XXA Unspecified injury of thorax, initial encounter: Secondary | ICD-10-CM | POA: Diagnosis present

## 2020-05-14 DIAGNOSIS — Z79899 Other long term (current) drug therapy: Secondary | ICD-10-CM | POA: Diagnosis not present

## 2020-05-14 DIAGNOSIS — R945 Abnormal results of liver function studies: Secondary | ICD-10-CM | POA: Diagnosis present

## 2020-05-14 DIAGNOSIS — E1140 Type 2 diabetes mellitus with diabetic neuropathy, unspecified: Secondary | ICD-10-CM | POA: Insufficient documentation

## 2020-05-14 DIAGNOSIS — E785 Hyperlipidemia, unspecified: Secondary | ICD-10-CM | POA: Diagnosis present

## 2020-05-14 DIAGNOSIS — Z7982 Long term (current) use of aspirin: Secondary | ICD-10-CM | POA: Diagnosis not present

## 2020-05-14 DIAGNOSIS — Z20822 Contact with and (suspected) exposure to covid-19: Secondary | ICD-10-CM | POA: Diagnosis not present

## 2020-05-14 DIAGNOSIS — S2242XA Multiple fractures of ribs, left side, initial encounter for closed fracture: Principal | ICD-10-CM | POA: Insufficient documentation

## 2020-05-14 DIAGNOSIS — S2249XA Multiple fractures of ribs, unspecified side, initial encounter for closed fracture: Secondary | ICD-10-CM

## 2020-05-14 DIAGNOSIS — R7989 Other specified abnormal findings of blood chemistry: Secondary | ICD-10-CM | POA: Diagnosis present

## 2020-05-14 DIAGNOSIS — M25511 Pain in right shoulder: Secondary | ICD-10-CM

## 2020-05-14 MED ORDER — FENTANYL CITRATE (PF) 100 MCG/2ML IJ SOLN
100.0000 ug | Freq: Once | INTRAMUSCULAR | Status: AC
  Administered 2020-05-15: 100 ug via INTRAVENOUS
  Filled 2020-05-14: qty 2
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
## 2020-05-14 NOTE — ED Triage Notes (Signed)
Pt has had alcohol earlier today
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
## 2020-05-15 ENCOUNTER — Emergency Department (HOSPITAL_COMMUNITY): Payer: Managed Care, Other (non HMO)

## 2020-05-15 ENCOUNTER — Observation Stay (HOSPITAL_BASED_OUTPATIENT_CLINIC_OR_DEPARTMENT_OTHER): Payer: Managed Care, Other (non HMO)

## 2020-05-15 ENCOUNTER — Other Ambulatory Visit: Payer: Self-pay

## 2020-05-15 ENCOUNTER — Observation Stay (HOSPITAL_COMMUNITY): Payer: Managed Care, Other (non HMO)

## 2020-05-15 ENCOUNTER — Encounter (HOSPITAL_COMMUNITY): Payer: Self-pay | Admitting: Internal Medicine

## 2020-05-15 DIAGNOSIS — R55 Syncope and collapse: Secondary | ICD-10-CM

## 2020-05-15 DIAGNOSIS — E119 Type 2 diabetes mellitus without complications: Secondary | ICD-10-CM

## 2020-05-15 DIAGNOSIS — S2249XA Multiple fractures of ribs, unspecified side, initial encounter for closed fracture: Secondary | ICD-10-CM | POA: Diagnosis present

## 2020-05-15 DIAGNOSIS — R945 Abnormal results of liver function studies: Secondary | ICD-10-CM | POA: Diagnosis present

## 2020-05-15 DIAGNOSIS — S2242XA Multiple fractures of ribs, left side, initial encounter for closed fracture: Secondary | ICD-10-CM

## 2020-05-15 DIAGNOSIS — R7989 Other specified abnormal findings of blood chemistry: Secondary | ICD-10-CM | POA: Diagnosis present

## 2020-05-15 LAB — COMPREHENSIVE METABOLIC PANEL
ALT: 28 U/L (ref 0–44)
ALT: 35 U/L (ref 0–44)
AST: 42 U/L — ABNORMAL HIGH (ref 15–41)
AST: 97 U/L — ABNORMAL HIGH (ref 15–41)
Albumin: 3.4 g/dL — ABNORMAL LOW (ref 3.5–5.0)
Albumin: 3.5 g/dL (ref 3.5–5.0)
Alkaline Phosphatase: 47 U/L (ref 38–126)
Alkaline Phosphatase: 54 U/L (ref 38–126)
Anion gap: 11 (ref 5–15)
Anion gap: 9 (ref 5–15)
BUN: 15 mg/dL (ref 8–23)
BUN: 18 mg/dL (ref 8–23)
CO2: 21 mmol/L — ABNORMAL LOW (ref 22–32)
CO2: 22 mmol/L (ref 22–32)
Calcium: 9 mg/dL (ref 8.9–10.3)
Calcium: 9.1 mg/dL (ref 8.9–10.3)
Chloride: 107 mmol/L (ref 98–111)
Chloride: 107 mmol/L (ref 98–111)
Creatinine, Ser: 0.96 mg/dL (ref 0.61–1.24)
Creatinine, Ser: 1.05 mg/dL (ref 0.61–1.24)
GFR calc Af Amer: >60 mL/min (ref >60)
GFR calc Af Amer: >60 mL/min (ref >60)
GFR calc non Af Amer: >60 mL/min (ref >60)
GFR calc non Af Amer: >60 mL/min (ref >60)
Glucose, Bld: 129 mg/dL — ABNORMAL HIGH (ref 70–99)
Glucose, Bld: 81 mg/dL (ref 70–99)
Potassium: 3.4 mmol/L — ABNORMAL LOW (ref 3.5–5.1)
Potassium: 4.6 mmol/L (ref 3.5–5.1)
Sodium: 138 mmol/L (ref 135–145)
Sodium: 139 mmol/L (ref 135–145)
Total Bilirubin: 0.9 mg/dL (ref 0.3–1.2)
Total Bilirubin: 1.5 mg/dL — ABNORMAL HIGH (ref 0.3–1.2)
Total Protein: 6.4 g/dL — ABNORMAL LOW (ref 6.5–8.1)
Total Protein: 6.8 g/dL (ref 6.5–8.1)

## 2020-05-15 LAB — CBG MONITORING, ED: Glucose-Capillary: 139 mg/dL — ABNORMAL HIGH (ref 70–99)

## 2020-05-15 LAB — HEMOGLOBIN A1C
Hgb A1c MFr Bld: 5 % (ref 4.8–5.6)
Mean Plasma Glucose: 96.8 mg/dL

## 2020-05-15 LAB — CBC
HCT: 41.9 % (ref 39.0–52.0)
HCT: 43 % (ref 39.0–52.0)
Hemoglobin: 13.7 g/dL (ref 13.0–17.0)
Hemoglobin: 14.3 g/dL (ref 13.0–17.0)
MCH: 32.1 pg (ref 26.0–34.0)
MCH: 31.8 pg (ref 26.0–34.0)
MCHC: 32.7 g/dL (ref 30.0–36.0)
MCHC: 33.3 g/dL (ref 30.0–36.0)
MCV: 98.1 fL (ref 80.0–100.0)
MCV: 95.8 fL (ref 80.0–100.0)
Platelets: 252 10*3/uL (ref 150–400)
Platelets: 253 10*3/uL (ref 150–400)
RBC: 4.27 MIL/uL (ref 4.22–5.81)
RBC: 4.49 MIL/uL (ref 4.22–5.81)
RDW: 12.1 % (ref 11.5–15.5)
RDW: 12 % (ref 11.5–15.5)
WBC: 8.8 10*3/uL (ref 4.0–10.5)
WBC: 7.2 10*3/uL (ref 4.0–10.5)
nRBC: 0 % (ref 0.0–0.2)
nRBC: 0 % (ref 0.0–0.2)

## 2020-05-15 LAB — ETHANOL: Alcohol, Ethyl (B): <10 mg/dL (ref <10)

## 2020-05-15 LAB — HIV ANTIBODY (ROUTINE TESTING W REFLEX): HIV Screen 4th Generation wRfx: NONREACTIVE

## 2020-05-15 LAB — GLUCOSE, CAPILLARY
Glucose-Capillary: 145 mg/dL — ABNORMAL HIGH (ref 70–99)
Glucose-Capillary: 152 mg/dL — ABNORMAL HIGH (ref 70–99)
Glucose-Capillary: 128 mg/dL — ABNORMAL HIGH (ref 70–99)
Glucose-Capillary: 166 mg/dL — ABNORMAL HIGH (ref 70–99)

## 2020-05-15 LAB — TROPONIN I (HIGH SENSITIVITY)
Troponin I (High Sensitivity): 10 ng/L (ref <18)
Troponin I (High Sensitivity): 9 ng/L (ref <18)

## 2020-05-15 LAB — ECHOCARDIOGRAM COMPLETE BUBBLE STUDY
Area-P 1/2: 3.6 cm2
Calc EF: 59.7 %
S' Lateral: 3.6 cm
Single Plane A2C EF: 57.2 %
Single Plane A4C EF: 60.9 %

## 2020-05-15 LAB — RESPIRATORY PANEL BY RT PCR (FLU A&B, COVID)
Influenza A by PCR: NEGATIVE
Influenza B by PCR: NEGATIVE
SARS Coronavirus 2 by RT PCR: NEGATIVE

## 2020-05-15 MED ORDER — ONDANSETRON HCL 4 MG PO TABS: 4.0000 mg | ORAL_TABLET | Freq: Four times a day (QID) | ORAL | Status: DC | PRN

## 2020-05-15 MED ORDER — HYDROMORPHONE HCL 1 MG/ML IJ SOLN
1.0000 mg | Freq: Once | INTRAMUSCULAR | Status: AC
  Administered 2020-05-15: 1 mg via INTRAVENOUS
  Filled 2020-05-15: qty 1

## 2020-05-15 MED ORDER — KETOROLAC TROMETHAMINE 30 MG/ML IJ SOLN
15.0000 mg | Freq: Once | INTRAMUSCULAR | Status: AC
  Administered 2020-05-15: 15 mg via INTRAVENOUS
  Filled 2020-05-15: qty 1

## 2020-05-15 MED ORDER — LIDOCAINE 5 % EX PTCH
1.0000 | MEDICATED_PATCH | CUTANEOUS | Status: DC
  Administered 2020-05-15 – 2020-05-16 (×2): 1 via TRANSDERMAL
  Filled 2020-05-15 (×2): qty 1

## 2020-05-15 MED ORDER — ONDANSETRON HCL 4 MG/2ML IJ SOLN: 4.0000 mg | Freq: Four times a day (QID) | INTRAMUSCULAR | Status: DC | PRN

## 2020-05-15 MED ORDER — FENTANYL CITRATE (PF) 100 MCG/2ML IJ SOLN
50.0000 ug | INTRAMUSCULAR | Status: AC | PRN
  Administered 2020-05-15 (×3): 50 ug via INTRAVENOUS
  Filled 2020-05-15 (×3): qty 2

## 2020-05-15 MED ORDER — OXYCODONE HCL 5 MG PO TABS
5.0000 mg | ORAL_TABLET | ORAL | Status: DC | PRN
  Administered 2020-05-15 – 2020-05-16 (×5): 5 mg via ORAL
  Filled 2020-05-15 (×5): qty 1

## 2020-05-15 MED ORDER — KETOROLAC TROMETHAMINE 15 MG/ML IJ SOLN: 15.0000 mg | Freq: Four times a day (QID) | INTRAMUSCULAR | Status: AC | PRN

## 2020-05-15 MED ORDER — IOHEXOL 300 MG/ML  SOLN
75.0000 mL | Freq: Once | INTRAMUSCULAR | Status: AC | PRN
  Administered 2020-05-15: 75 mL via INTRAVENOUS

## 2020-05-15 MED ORDER — INSULIN ASPART 100 UNIT/ML ~~LOC~~ SOLN
0.0000 [IU] | Freq: Three times a day (TID) | SUBCUTANEOUS | Status: DC
  Administered 2020-05-15: 2 [IU] via SUBCUTANEOUS
  Administered 2020-05-15: 3 [IU] via SUBCUTANEOUS
  Administered 2020-05-15 – 2020-05-16 (×3): 2 [IU] via SUBCUTANEOUS

## 2020-05-15 MED ORDER — MORPHINE SULFATE (PF) 4 MG/ML IV SOLN
4.0000 mg | Freq: Once | INTRAVENOUS | Status: AC
Start: 2020-05-15 — End: 2020-05-15
  Administered 2020-05-15: 4 mg via INTRAVENOUS
  Filled 2020-05-15: qty 1

## 2020-05-15 MED ORDER — DIPHENHYDRAMINE HCL 25 MG PO CAPS
25.0000 mg | ORAL_CAPSULE | Freq: Once | ORAL | Status: AC
  Administered 2020-05-15: 25 mg via ORAL
  Filled 2020-05-15: qty 1

## 2020-05-15 NOTE — ED Notes (Signed)
The pts wife has been called and his wife will be here shortly
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
## 2020-05-15 NOTE — Plan of Care (Signed)
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
## 2020-05-15 NOTE — ED Notes (Signed)
Pt asking for pain meds on his return from c-t
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
## 2020-05-15 NOTE — Progress Notes (Signed)
  Echocardiogram 2D Echocardiogram has been performed.  Michiel Cowboy 05/15/2020, 4:45 PM

## 2020-05-15 NOTE — ED Notes (Signed)
Lab called cbc hemolyzed redraw for purple and green tubes

## 2020-05-16 DIAGNOSIS — S2242XA Multiple fractures of ribs, left side, initial encounter for closed fracture: Secondary | ICD-10-CM | POA: Diagnosis not present

## 2020-05-16 LAB — GLUCOSE, CAPILLARY
Glucose-Capillary: 148 mg/dL — ABNORMAL HIGH (ref 70–99)
Glucose-Capillary: 112 mg/dL — ABNORMAL HIGH (ref 70–99)

## 2020-05-16 LAB — BASIC METABOLIC PANEL
Anion gap: 10 (ref 5–15)
BUN: 15 mg/dL (ref 8–23)
CO2: 23 mmol/L (ref 22–32)
Calcium: 9.4 mg/dL (ref 8.9–10.3)
Chloride: 108 mmol/L (ref 98–111)
Creatinine, Ser: 0.92 mg/dL (ref 0.61–1.24)
GFR calc Af Amer: >60 mL/min (ref >60)
GFR calc non Af Amer: >60 mL/min (ref >60)
Glucose, Bld: 116 mg/dL — ABNORMAL HIGH (ref 70–99)
Potassium: 3.5 mmol/L (ref 3.5–5.1)
Sodium: 141 mmol/L (ref 135–145)

## 2020-05-16 LAB — MAGNESIUM: Magnesium: 1.8 mg/dL (ref 1.7–2.4)

## 2020-05-16 MED ORDER — OXYCODONE-ACETAMINOPHEN 5-325 MG PO TABS: 1.0000 | ORAL_TABLET | Freq: Three times a day (TID) | ORAL | 0 refills | Status: DC | PRN

## 2020-05-16 MED FILL — OXYCODONE-APAP 5-325MG: 5-325 | 5 days supply | Qty: 15 | Fill #0
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
## 2020-05-16 NOTE — Plan of Care (Signed)
  Problem: Education: Goal: Knowledge of General Education information will improve Description Including pain rating scale, medication(s)/side effects and non-pharmacologic comfort measures Outcome: Progressing   Problem: Health Behavior/Discharge Planning: Goal: Ability to manage health-related needs will improve Outcome: Progressing   
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
## 2020-05-16 NOTE — Progress Notes (Signed)
Provided discharge education/instructions to Pt and wife, all questions and concerns addressed, Pt not in distress, BSC delivered to room. Pt discharged home with belongings accompanied by wife.

## 2020-05-18 MED FILL — TRULICITY 1.5 MG/0.5 ML PEN: 1.5 | 28 days supply | Qty: 2 | Fill #8

## 2020-05-23 ENCOUNTER — Other Ambulatory Visit (HOSPITAL_COMMUNITY): Payer: Self-pay | Admitting: Internal Medicine

## 2020-05-23 MED FILL — OXYCODONE-APAP 5-325MG: 5-325 | 8 days supply | Qty: 30 | Fill #0

## 2020-05-29 ENCOUNTER — Other Ambulatory Visit (HOSPITAL_COMMUNITY): Payer: Self-pay | Admitting: Internal Medicine

## 2020-05-29 MED FILL — glipiZIDE 5 MG TABS: 5 | 90 days supply | Qty: 90 | Fill #1

## 2020-05-29 MED FILL — METFORMIN HCL 1000 MG TABS: 1000 | 90 days supply | Qty: 180 | Fill #0

## 2020-06-05 ENCOUNTER — Other Ambulatory Visit (HOSPITAL_COMMUNITY): Payer: Self-pay | Admitting: Internal Medicine

## 2020-06-05 MED FILL — GABAPENTIN 300 MG CAPSULE: 300 | 30 days supply | Qty: 90 | Fill #0

## 2020-06-16 ENCOUNTER — Other Ambulatory Visit: Payer: Self-pay

## 2020-06-16 ENCOUNTER — Ambulatory Visit: Payer: Managed Care, Other (non HMO) | Attending: Internal Medicine | Admitting: Physical Therapy

## 2020-06-16 ENCOUNTER — Encounter: Payer: Self-pay | Admitting: Physical Therapy

## 2020-06-16 DIAGNOSIS — M25512 Pain in left shoulder: Secondary | ICD-10-CM | POA: Diagnosis present

## 2020-06-16 DIAGNOSIS — M25511 Pain in right shoulder: Secondary | ICD-10-CM | POA: Insufficient documentation
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
## 2020-06-21 ENCOUNTER — Encounter: Payer: Self-pay | Admitting: Physical Therapy

## 2020-06-21 ENCOUNTER — Ambulatory Visit: Payer: Managed Care, Other (non HMO) | Attending: Internal Medicine | Admitting: Physical Therapy

## 2020-06-21 ENCOUNTER — Other Ambulatory Visit: Payer: Self-pay

## 2020-06-21 DIAGNOSIS — M25512 Pain in left shoulder: Secondary | ICD-10-CM | POA: Diagnosis present

## 2020-06-21 DIAGNOSIS — M25511 Pain in right shoulder: Secondary | ICD-10-CM | POA: Diagnosis present
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
## 2020-06-26 MED FILL — TRULICITY 1.5 MG/0.5 ML PEN: 1.5 | 28 days supply | Qty: 2 | Fill #9

## 2020-06-26 MED FILL — METOPROLOL SUCCINATE ER 25: 25 | 90 days supply | Qty: 90 | Fill #1

## 2020-06-26 MED FILL — LOSARTAN-HCTZ 100-12.5 MG T: 100-12.5 | 30 days supply | Qty: 30 | Fill #7

## 2020-06-30 ENCOUNTER — Other Ambulatory Visit: Payer: Self-pay

## 2020-06-30 ENCOUNTER — Encounter: Payer: Self-pay | Admitting: Physical Therapy

## 2020-06-30 ENCOUNTER — Ambulatory Visit: Payer: Managed Care, Other (non HMO) | Admitting: Physical Therapy

## 2020-06-30 DIAGNOSIS — M25512 Pain in left shoulder: Secondary | ICD-10-CM

## 2020-06-30 DIAGNOSIS — M25511 Pain in right shoulder: Secondary | ICD-10-CM | POA: Diagnosis not present
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
## 2020-07-03 ENCOUNTER — Encounter: Payer: Self-pay | Admitting: Physical Therapy

## 2020-07-03 ENCOUNTER — Ambulatory Visit: Payer: Managed Care, Other (non HMO) | Admitting: Physical Therapy

## 2020-07-03 ENCOUNTER — Other Ambulatory Visit: Payer: Self-pay

## 2020-07-03 DIAGNOSIS — M25511 Pain in right shoulder: Secondary | ICD-10-CM

## 2020-07-03 DIAGNOSIS — M25512 Pain in left shoulder: Secondary | ICD-10-CM
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
## 2020-07-07 ENCOUNTER — Ambulatory Visit: Payer: Managed Care, Other (non HMO) | Admitting: Physical Therapy

## 2020-07-07 ENCOUNTER — Other Ambulatory Visit: Payer: Self-pay

## 2020-07-07 DIAGNOSIS — M25511 Pain in right shoulder: Secondary | ICD-10-CM

## 2020-07-07 DIAGNOSIS — M25512 Pain in left shoulder: Secondary | ICD-10-CM
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
## 2020-07-10 ENCOUNTER — Other Ambulatory Visit (HOSPITAL_COMMUNITY): Payer: Self-pay | Admitting: Ophthalmology

## 2020-07-10 ENCOUNTER — Ambulatory Visit: Payer: Managed Care, Other (non HMO) | Admitting: Physical Therapy

## 2020-07-10 ENCOUNTER — Other Ambulatory Visit: Payer: Self-pay

## 2020-07-10 DIAGNOSIS — H25043 Posterior subcapsular polar age-related cataract, bilateral: Secondary | ICD-10-CM | POA: Diagnosis not present

## 2020-07-10 DIAGNOSIS — M25511 Pain in right shoulder: Secondary | ICD-10-CM | POA: Diagnosis not present

## 2020-07-10 DIAGNOSIS — M25512 Pain in left shoulder: Secondary | ICD-10-CM

## 2020-07-10 DIAGNOSIS — H40013 Open angle with borderline findings, low risk, bilateral: Secondary | ICD-10-CM | POA: Diagnosis not present

## 2020-07-10 DIAGNOSIS — H2512 Age-related nuclear cataract, left eye: Secondary | ICD-10-CM | POA: Diagnosis not present

## 2020-07-10 DIAGNOSIS — E113313 Type 2 diabetes mellitus with moderate nonproliferative diabetic retinopathy with macular edema, bilateral: Secondary | ICD-10-CM | POA: Diagnosis not present

## 2020-07-10 DIAGNOSIS — H2513 Age-related nuclear cataract, bilateral: Secondary | ICD-10-CM | POA: Diagnosis not present

## 2020-07-10 MED FILL — GATIFLOXACIN 0.5% EYE DROPS: 0.5 | 9 days supply | Qty: 3 | Fill #0

## 2020-07-10 MED FILL — PREDNISOLONE AC 1% EYE DROP: 1 | 19 days supply | Qty: 5 | Fill #0

## 2020-07-10 MED FILL — PROLENSA 0.07% EYE DROPS: 0.07 | 45 days supply | Qty: 3 | Fill #0
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
## 2020-07-17 ENCOUNTER — Ambulatory Visit: Payer: Managed Care, Other (non HMO) | Admitting: Physical Therapy

## 2020-07-17 ENCOUNTER — Other Ambulatory Visit: Payer: Self-pay

## 2020-07-17 DIAGNOSIS — M25511 Pain in right shoulder: Secondary | ICD-10-CM

## 2020-07-17 DIAGNOSIS — M25512 Pain in left shoulder: Secondary | ICD-10-CM
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
## 2020-07-18 ENCOUNTER — Other Ambulatory Visit (HOSPITAL_COMMUNITY): Payer: Self-pay | Admitting: Internal Medicine

## 2020-07-18 MED FILL — SIMVASTATIN 20 MG TABLET: 20 | 90 days supply | Qty: 90 | Fill #0

## 2020-07-20 ENCOUNTER — Encounter: Payer: Medicare Other | Admitting: Physical Therapy

## 2020-07-24 ENCOUNTER — Ambulatory Visit: Payer: Managed Care, Other (non HMO) | Attending: Internal Medicine | Admitting: Physical Therapy

## 2020-07-24 ENCOUNTER — Encounter: Payer: Self-pay | Admitting: Physical Therapy

## 2020-07-24 ENCOUNTER — Other Ambulatory Visit: Payer: Self-pay

## 2020-07-24 DIAGNOSIS — M25511 Pain in right shoulder: Secondary | ICD-10-CM | POA: Diagnosis not present

## 2020-07-24 DIAGNOSIS — M25512 Pain in left shoulder: Secondary | ICD-10-CM | POA: Diagnosis not present

## 2020-07-24 MED FILL — TRULICITY 1.5 MG/0.5 ML PEN: 1.5 | 28 days supply | Qty: 2 | Fill #10
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
## 2020-07-27 ENCOUNTER — Encounter: Payer: Self-pay | Admitting: Physical Therapy

## 2020-07-27 ENCOUNTER — Other Ambulatory Visit: Payer: Self-pay

## 2020-07-27 ENCOUNTER — Ambulatory Visit: Payer: Managed Care, Other (non HMO) | Admitting: Physical Therapy

## 2020-07-27 DIAGNOSIS — M25512 Pain in left shoulder: Secondary | ICD-10-CM

## 2020-07-27 DIAGNOSIS — M25511 Pain in right shoulder: Secondary | ICD-10-CM | POA: Diagnosis not present
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
## 2020-08-03 DIAGNOSIS — M5442 Lumbago with sciatica, left side: Secondary | ICD-10-CM | POA: Diagnosis not present

## 2020-08-08 ENCOUNTER — Other Ambulatory Visit: Payer: Self-pay | Admitting: Neurosurgery

## 2020-08-10 ENCOUNTER — Other Ambulatory Visit (HOSPITAL_COMMUNITY): Payer: Self-pay | Admitting: Internal Medicine

## 2020-08-10 MED FILL — LOSARTAN-HCTZ 100-12.5 MG T: 100-12.5 | 30 days supply | Qty: 30 | Fill #0

## 2020-08-17 ENCOUNTER — Other Ambulatory Visit (HOSPITAL_COMMUNITY): Payer: Self-pay | Admitting: Ophthalmology

## 2020-08-17 MED FILL — PROLENSA 0.07% EYE DROPS: 0.07 | 60 days supply | Qty: 3 | Fill #0

## 2020-08-17 MED FILL — PREDNISOLONE AC 1% EYE DROP: 1 | 25 days supply | Qty: 5 | Fill #0

## 2020-08-17 MED FILL — GATIFLOXACIN 0.5% EYE DROPS: 0.5 | 13 days supply | Qty: 3 | Fill #0

## 2020-08-21 NOTE — Pre-Procedure Instructions (Signed)
Your procedure is scheduled on Thursday, January 6th at 10:35.  Report to [facility] Main Entrance "A" at 8:35 A.M., and check in at the Admitting office.  Call this number if you have problems the morning of surgery:  [phone]  Call [phone] if you have any questions prior to your surgery date Monday-Friday 8am-4pm    Remember:  Do not eat or drink after midnight the night before your surgery    Take these medicines the morning of surgery with A SIP OF WATER  metoprolol succinate (TOPROL-XL)  simvastatin (ZOCOR)  Follow your surgeon's instructions on when to stop Aspirin.  If no instructions were given by your surgeon then you will need to call the office to get those instructions.    As of today, STOP taking any Aspirin (unless otherwise instructed by your surgeon) Aleve, Naproxen, Ibuprofen, Motrin, Advil, Goody's, BC's, all herbal medications, fish oil, and all vitamins.   WHAT DO I DO ABOUT MY DIABETES MEDICATION?   Marland Kitchen Do not take metFORMIN (GLUCOPHAGE) or glipiZIDE (GLUCOTROL) on the morning of surgery.   . THE NIGHT BEFORE SURGERY, do NOT take your dose of  glipiZIDE (GLUCOTROL).     . THE MORNING OF SURGERY, take 5 units of Insulin Glargine (BASAGLAR KWIKPEN) insulin.  . The day of surgery, do not take other diabetes injectables, including Trulicity (dulaglutide).   HOW TO MANAGE YOUR DIABETES BEFORE AND AFTER SURGERY  Why is it important to control my blood sugar before and after surgery? . Improving blood sugar levels before and after surgery helps healing and can limit problems. . A way of improving blood sugar control is eating a healthy diet by: o  Eating less sugar and carbohydrates o  Increasing activity/exercise o  Talking with your doctor about reaching your blood sugar goals . High blood sugars (greater than 180 mg/dL) can raise your risk of infections and slow your recovery, so you will need to focus on controlling your diabetes during the weeks  before surgery. . Make sure that the doctor who takes care of your diabetes knows about your planned surgery including the date and location.  How do I manage my blood sugar before surgery? . Check your blood sugar at least 4 times a day, starting 2 days before surgery, to make sure that the level is not too high or low. . Check your blood sugar the morning of your surgery when you wake up and every 2 hours until you get to the Short Stay unit. o If your blood sugar is less than 70 mg/dL, you will need to treat for low blood sugar: - Do not take insulin. - Treat a low blood sugar (less than 70 mg/dL) with  cup of clear juice (cranberry or apple), 4 glucose tablets, OR glucose gel. - Recheck blood sugar in 15 minutes after treatment (to make sure it is greater than 70 mg/dL). If your blood sugar is not greater than 70 mg/dL on recheck, call [phone] for further instructions. . Report your blood sugar to the short stay nurse when you get to Short Stay.  . If you are admitted to the hospital after surgery: o Your blood sugar will be checked by the staff and you will probably be given insulin after surgery (instead of oral diabetes medicines) to make sure you have good blood sugar levels. o The goal for blood sugar control after surgery is 80-180 mg/dL.  Do not wear jewelry, make up, or nail polish            Do not wear lotions, powders, colognes, or deodorant.            Men may shave face and neck.            Do not bring valuables to the hospital.            [facility] is not responsible for any belongings or valuables.  Do NOT Smoke (Tobacco/Vaping) or drink Alcohol 24 hours prior to your procedure If you use a CPAP at night, you may bring all equipment for your overnight stay.   Contacts, glasses, dentures or bridgework may not be worn into surgery.      For patients admitted to the hospital, discharge time will be determined by your treatment team.    Patients discharged the day of surgery will not be allowed to drive home, and someone needs to stay with them for 24 hours.    Special instructions:   [facility] [facility]- Preparing For Surgery  Before surgery, you can play an important role. Because skin is not sterile, your skin needs to be as free of germs as possible. You can reduce the number of germs on your skin by washing with CHG (chlorahexidine gluconate) Soap before surgery.  CHG is an antiseptic cleaner which kills germs and bonds with the skin to continue killing germs even after washing.    Oral Hygiene is also important to reduce your risk of infection.  Remember - BRUSH YOUR TEETH THE MORNING OF SURGERY WITH YOUR REGULAR TOOTHPASTE  Please do not use if you have an allergy to CHG or antibacterial soaps. If your skin becomes reddened/irritated stop using the CHG.  Do not shave (including legs and underarms) for at least 48 hours prior to first CHG shower. It is OK to shave your face.  Please follow these instructions carefully.   1. Shower the NIGHT BEFORE SURGERY and the MORNING OF SURGERY with CHG Soap.   2. If you chose to wash your hair, wash your hair first as usual with your normal shampoo.  3. After you shampoo, rinse your hair and body thoroughly to remove the shampoo.  4. Use CHG as you would any other liquid soap. You can apply CHG directly to the skin and wash gently with a scrungie or a clean washcloth.   5. Apply the CHG Soap to your body ONLY FROM THE NECK DOWN.  Do not use on open wounds or open sores. Avoid contact with your eyes, ears, mouth and genitals (private parts). Wash Face and genitals (private parts)  with your normal soap.   6. Wash thoroughly, paying special attention to the area where your surgery will be performed.  7. Thoroughly rinse your body with warm water from the neck down.  8. DO NOT shower/wash with your normal soap after using and rinsing off the CHG Soap.  9. Pat yourself dry with a  CLEAN TOWEL.  10. Wear CLEAN PAJAMAS to bed the night before surgery  11. Place CLEAN SHEETS on your bed the night of your first shower and DO NOT SLEEP WITH PETS.   Day of Surgery: Wear Clean/Comfortable clothing the morning of surgery Do not apply any deodorants/lotions.   Remember to brush your teeth WITH YOUR REGULAR TOOTHPASTE.   Please read over the following fact sheets that you were given.

## 2020-08-22 ENCOUNTER — Encounter (HOSPITAL_COMMUNITY): Payer: Self-pay

## 2020-08-22 ENCOUNTER — Other Ambulatory Visit (HOSPITAL_COMMUNITY)
Admission: RE | Admit: 2020-08-22 | Discharge: 2020-08-22 | Disposition: A | Discharge status: Home / Self Care | Payer: Managed Care, Other (non HMO) | Source: Ambulatory Visit | Attending: Neurosurgery | Admitting: Neurosurgery

## 2020-08-22 ENCOUNTER — Encounter (HOSPITAL_COMMUNITY)
Admission: RE | Admit: 2020-08-22 | Discharge: 2020-08-22 | Disposition: A | Discharge status: Home / Self Care | Payer: Managed Care, Other (non HMO) | Source: Ambulatory Visit | Attending: Neurosurgery | Admitting: Neurosurgery

## 2020-08-22 ENCOUNTER — Other Ambulatory Visit: Payer: Self-pay

## 2020-08-22 DIAGNOSIS — Z20822 Contact with and (suspected) exposure to covid-19: Secondary | ICD-10-CM | POA: Insufficient documentation

## 2020-08-22 DIAGNOSIS — Z01812 Encounter for preprocedural laboratory examination: Secondary | ICD-10-CM | POA: Insufficient documentation

## 2020-08-22 HISTORY — DX: Personal history of other diseases of the digestive system: Z87.19

## 2020-08-22 LAB — TYPE AND SCREEN
ABO/RH(D): A POS
Antibody Screen: NEGATIVE

## 2020-08-22 LAB — CBC
HCT: 43.8 % (ref 39.0–52.0)
Hemoglobin: 14.4 g/dL (ref 13.0–17.0)
MCH: 31.4 pg (ref 26.0–34.0)
MCHC: 32.9 g/dL (ref 30.0–36.0)
MCV: 95.4 fL (ref 80.0–100.0)
Platelets: 259 10*3/uL (ref 150–400)
RBC: 4.59 MIL/uL (ref 4.22–5.81)
RDW: 11.7 % (ref 11.5–15.5)
WBC: 7.6 10*3/uL (ref 4.0–10.5)
nRBC: 0 % (ref 0.0–0.2)

## 2020-08-22 LAB — SARS CORONAVIRUS 2 (TAT 6-24 HRS): SARS Coronavirus 2: NEGATIVE

## 2020-08-22 LAB — GLUCOSE, CAPILLARY: Glucose-Capillary: 146 mg/dL — ABNORMAL HIGH (ref 70–99)

## 2020-08-22 LAB — BASIC METABOLIC PANEL
Anion gap: 9 (ref 5–15)
BUN: 14 mg/dL (ref 8–23)
CO2: 27 mmol/L (ref 22–32)
Calcium: 9.8 mg/dL (ref 8.9–10.3)
Chloride: 102 mmol/L (ref 98–111)
Creatinine, Ser: 0.85 mg/dL (ref 0.61–1.24)
GFR, Estimated: >60 mL/min (ref >60)
Glucose, Bld: 168 mg/dL — ABNORMAL HIGH (ref 70–99)
Potassium: 4.1 mmol/L (ref 3.5–5.1)
Sodium: 138 mmol/L (ref 135–145)

## 2020-08-22 LAB — HEMOGLOBIN A1C
Hgb A1c MFr Bld: 5.4 % (ref 4.8–5.6)
Mean Plasma Glucose: 108.28 mg/dL

## 2020-08-22 LAB — SURGICAL PCR SCREEN
MRSA, PCR: NEGATIVE
Staphylococcus aureus: POSITIVE — AB

## 2020-08-22 NOTE — Progress Notes (Signed)
PCP - Dr. Quitman Livings Cardiologist - denies  Chest x-ray - 05/15/20 EKG - 05/15/20 Stress Test - 2016 ECHO - 05/15/20 Cardiac Cath - denies  Sleep Study - denies CPAP - denies  Fasting Blood Sugar - 90-130 Checks Blood Sugar 1 time a day CBG at PAT 146 Will collect A1C  Blood Thinner Instructions: n/a Aspirin Instructions: no instructions given, LD 08/22/20   COVID TEST- 08/22/20; pt aware of quarantine guidelines.   Anesthesia review: no    Patient denies shortness of breath, fever, cough and chest pain at PAT appointment   All instructions explained to the patient, with a verbal understanding of the material. Patient agrees to go over the instructions while at home for a better understanding. Patient also instructed to self quarantine after being tested for COVID-19. The opportunity to ask questions was provided.    Coronavirus Screening  Have you experienced the following symptoms:  Cough yes/no: No Fever (>100.62F)  yes/no: No Runny nose yes/no: No Sore throat yes/no: No Difficulty breathing/shortness of breath  yes/no: No  Have you or a family member traveled in the last 14 days and where? yes/no: No   If the patient indicates "YES" to the above questions, their PAT will be rescheduled to limit the exposure to others and, the surgeon will be notified. THE PATIENT WILL NEED TO BE ASYMPTOMATIC FOR 14 DAYS.   If the patient is not experiencing any of these symptoms, the PAT nurse will instruct them to NOT bring anyone with them to their appointment since they may have these symptoms or traveled as well.   Please remind your patients and families that hospital visitation restrictions are in effect and the importance of the restrictions.

## 2020-08-28 ENCOUNTER — Other Ambulatory Visit: Payer: Self-pay | Admitting: Neurosurgery
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
## 2020-09-04 ENCOUNTER — Other Ambulatory Visit (HOSPITAL_COMMUNITY): Payer: Self-pay | Admitting: Internal Medicine

## 2020-09-04 MED FILL — TRULICITY 1.5 MG/0.5 ML PEN: 1.5 | 28 days supply | Qty: 2 | Fill #0

## 2020-09-07 DIAGNOSIS — H2511 Age-related nuclear cataract, right eye: Secondary | ICD-10-CM | POA: Diagnosis not present

## 2020-09-11 MED FILL — LOSARTAN-HCTZ 100-12.5 MG T: 100-12.5 | 30 days supply | Qty: 30 | Fill #1

## 2020-09-14 DIAGNOSIS — E113313 Type 2 diabetes mellitus with moderate nonproliferative diabetic retinopathy with macular edema, bilateral: Secondary | ICD-10-CM | POA: Diagnosis not present

## 2020-09-14 DIAGNOSIS — H43813 Vitreous degeneration, bilateral: Secondary | ICD-10-CM | POA: Diagnosis not present

## 2020-09-14 DIAGNOSIS — H3582 Retinal ischemia: Secondary | ICD-10-CM | POA: Diagnosis not present

## 2020-09-14 DIAGNOSIS — E113311 Type 2 diabetes mellitus with moderate nonproliferative diabetic retinopathy with macular edema, right eye: Secondary | ICD-10-CM | POA: Diagnosis not present

## 2020-09-20 DIAGNOSIS — E113312 Type 2 diabetes mellitus with moderate nonproliferative diabetic retinopathy with macular edema, left eye: Secondary | ICD-10-CM | POA: Diagnosis not present

## 2020-09-20 NOTE — Progress Notes (Signed)
[place], [place] - [address] [facility]. [address] [place] [address] Phone: [phone] Fax: [phone]      Your procedure is scheduled on February 9  Report to [facility] Main Entrance "A" at 1120 A.M., and check in at the Admitting office.  Call this number if you have problems the morning of surgery:  [phone]  Call [phone] if you have any questions prior to your surgery date Monday-Friday 8am-4pm    Remember:  Do not eat or drink after midnight the night before your surgery     Take these medicines the morning of surgery with A SIP OF WATER  gabapentin (NEURONTIN) metoprolol succinate (TOPROL-XL) simvastatin (ZOCOR)  .Follow your surgeon's instructions on when to stop Aspirin.  If no instructions were given by your surgeon then you will need to call the office to get those instructions.    As of today, STOP taking any Aleve, Naproxen, Ibuprofen, Motrin, Advil, Goody's, BC's, all herbal medications, fish oil, and all vitamins.   WHAT DO I DO ABOUT MY DIABETES MEDICATION?  Do not take glipiZIDE (GLUCOTROL) the night before surgery  . Do not take oral diabetes medicines (pills) the morning of surgery. glipiZIDE (GLUCOTROL) and metFORMIN (GLUCOPHAGE)        . THE MORNING OF SURGERY, take _______5______ units of ____Insulin Glargine (BASAGLAR KWIKPEN)______insulin.  . The day of surgery, do not take other diabetes injectables, including Byetta (exenatide), Bydureon (exenatide ER), Victoza (liraglutide), or Trulicity (dulaglutide).  . If your CBG is greater than 220 mg/dL, you may take  of your sliding scale (correction) dose of insulin.   HOW TO MANAGE YOUR DIABETES BEFORE AND AFTER SURGERY  Why is it important to control my blood sugar before and after surgery? . Improving blood sugar levels before and after surgery helps healing and can limit problems. . A way of improving blood sugar control is eating a  healthy diet by: o  Eating less sugar and carbohydrates o  Increasing activity/exercise o  Talking with your doctor about reaching your blood sugar goals . High blood sugars (greater than 180 mg/dL) can raise your risk of infections and slow your recovery, so you will need to focus on controlling your diabetes during the weeks before surgery. . Make sure that the doctor who takes care of your diabetes knows about your planned surgery including the date and location.  How do I manage my blood sugar before surgery? . Check your blood sugar at least 4 times a day, starting 2 days before surgery, to make sure that the level is not too high or low. . Check your blood sugar the morning of your surgery when you wake up and every 2 hours until you get to the Short Stay unit. o If your blood sugar is less than 70 mg/dL, you will need to treat for low blood sugar: - Do not take insulin. - Treat a low blood sugar (less than 70 mg/dL) with  cup of clear juice (cranberry or apple), 4 glucose tablets, OR glucose gel. - Recheck blood sugar in 15 minutes after treatment (to make sure it is greater than 70 mg/dL). If your blood sugar is not greater than 70 mg/dL on recheck, call [phone] for further instructions. . Report your blood sugar to the short stay nurse when you get to Short Stay.  . If you are admitted to the hospital after surgery: o Your blood sugar will be checked by the staff and  you will probably be given insulin after surgery (instead of oral diabetes medicines) to make sure you have good blood sugar levels. o The goal for blood sugar control after surgery is 80-180 mg/dL.                     Do not wear jewelry            Do not wear lotions, powders, colognes, or deodorant.            Men may shave face and neck.            Do not bring valuables to the hospital.            [facility] is not responsible for any belongings or valuables.  Do NOT Smoke (Tobacco/Vaping) or drink Alcohol  24 hours prior to your procedure If you use a CPAP at night, you may bring all equipment for your overnight stay.   Contacts, glasses, dentures or bridgework may not be worn into surgery.      For patients admitted to the hospital, discharge time will be determined by your treatment team.   Patients discharged the day of surgery will not be allowed to drive home, and someone needs to stay with them for 24 hours.    Special instructions:   [place]- Preparing For Surgery  Before surgery, you can play an important role. Because skin is not sterile, your skin needs to be as free of germs as possible. You can reduce the number of germs on your skin by washing with CHG (chlorahexidine gluconate) Soap before surgery.  CHG is an antiseptic cleaner which kills germs and bonds with the skin to continue killing germs even after washing.    Oral Hygiene is also important to reduce your risk of infection.  Remember - BRUSH YOUR TEETH THE MORNING OF SURGERY WITH YOUR REGULAR TOOTHPASTE  Please do not use if you have an allergy to CHG or antibacterial soaps. If your skin becomes reddened/irritated stop using the CHG.  Do not shave (including legs and underarms) for at least 48 hours prior to first CHG shower. It is OK to shave your face.  Please follow these instructions carefully.   1. Shower the NIGHT BEFORE SURGERY and the MORNING OF SURGERY with CHG Soap.   2. If you chose to wash your hair, wash your hair first as usual with your normal shampoo.  3. After you shampoo, rinse your hair and body thoroughly to remove the shampoo.  4. Use CHG as you would any other liquid soap. You can apply CHG directly to the skin and wash gently with a scrungie or a clean washcloth.   5. Apply the CHG Soap to your body ONLY FROM THE NECK DOWN.  Do not use on open wounds or open sores. Avoid contact with your eyes, ears, mouth and genitals (private parts). Wash Face and genitals (private parts)  with your  normal soap.   6. Wash thoroughly, paying special attention to the area where your surgery will be performed.  7. Thoroughly rinse your body with warm water from the neck down.  8. DO NOT shower/wash with your normal soap after using and rinsing off the CHG Soap.  9. Pat yourself dry with a CLEAN TOWEL.  10. Wear CLEAN PAJAMAS to bed the night before surgery  11. Place CLEAN SHEETS on your bed the night of your first shower and DO NOT SLEEP WITH PETS.   Day  of Surgery: Wear Clean/Comfortable clothing the morning of surgery Do not apply any deodorants/lotions.   Remember to brush your teeth WITH YOUR REGULAR TOOTHPASTE.   Please read over the following fact sheets that you were given.

## 2020-09-21 ENCOUNTER — Encounter (HOSPITAL_COMMUNITY): Payer: Self-pay

## 2020-09-21 ENCOUNTER — Other Ambulatory Visit: Payer: Self-pay

## 2020-09-21 ENCOUNTER — Encounter (HOSPITAL_COMMUNITY)
Admission: RE | Admit: 2020-09-21 | Discharge: 2020-09-21 | Disposition: A | Discharge status: Home / Self Care | Payer: Managed Care, Other (non HMO) | Source: Ambulatory Visit | Attending: Neurosurgery | Admitting: Neurosurgery

## 2020-09-21 DIAGNOSIS — Z794 Long term (current) use of insulin: Secondary | ICD-10-CM | POA: Insufficient documentation

## 2020-09-21 DIAGNOSIS — Z01812 Encounter for preprocedural laboratory examination: Secondary | ICD-10-CM | POA: Insufficient documentation

## 2020-09-21 DIAGNOSIS — E119 Type 2 diabetes mellitus without complications: Secondary | ICD-10-CM | POA: Diagnosis not present

## 2020-09-21 LAB — GLUCOSE, CAPILLARY: Glucose-Capillary: 110 mg/dL — ABNORMAL HIGH (ref 70–99)

## 2020-09-21 LAB — CBC
HCT: 41.3 % (ref 39.0–52.0)
Hemoglobin: 14.2 g/dL (ref 13.0–17.0)
MCH: 32.5 pg (ref 26.0–34.0)
MCHC: 34.4 g/dL (ref 30.0–36.0)
MCV: 94.5 fL (ref 80.0–100.0)
Platelets: 257 10*3/uL (ref 150–400)
RBC: 4.37 MIL/uL (ref 4.22–5.81)
RDW: 11.9 % (ref 11.5–15.5)
WBC: 7.3 10*3/uL (ref 4.0–10.5)
nRBC: 0 % (ref 0.0–0.2)

## 2020-09-21 LAB — COMPREHENSIVE METABOLIC PANEL
ALT: 10 U/L (ref 0–44)
AST: 14 U/L — ABNORMAL LOW (ref 15–41)
Albumin: 3.7 g/dL (ref 3.5–5.0)
Alkaline Phosphatase: 51 U/L (ref 38–126)
Anion gap: 12 (ref 5–15)
BUN: 17 mg/dL (ref 8–23)
CO2: 23 mmol/L (ref 22–32)
Calcium: 9 mg/dL (ref 8.9–10.3)
Chloride: 105 mmol/L (ref 98–111)
Creatinine, Ser: 0.94 mg/dL (ref 0.61–1.24)
GFR, Estimated: >60 mL/min (ref >60)
Glucose, Bld: 109 mg/dL — ABNORMAL HIGH (ref 70–99)
Potassium: 3.8 mmol/L (ref 3.5–5.1)
Sodium: 140 mmol/L (ref 135–145)
Total Bilirubin: 1 mg/dL (ref 0.3–1.2)
Total Protein: 7 g/dL (ref 6.5–8.1)

## 2020-09-21 LAB — SURGICAL PCR SCREEN
MRSA, PCR: NEGATIVE
Staphylococcus aureus: NEGATIVE

## 2020-09-21 LAB — TYPE AND SCREEN
ABO/RH(D): A POS
Antibody Screen: NEGATIVE

## 2020-09-21 MED ORDER — CHLORHEXIDINE GLUCONATE CLOTH 2 % EX PADS: 6.0000 | MEDICATED_PAD | Freq: Once | CUTANEOUS | Status: DC

## 2020-09-21 NOTE — Progress Notes (Signed)
PCP - DR Enedina Finner    [facility] PT Cardiologist -NA   -   Chest x-ray - NA EKG - 9/21 Stress Test - 2016 ECHO - 9/21 Cardiac Cath -NA   Sleep Study - NA    Fasting Blood Sugar - 121 Checks Blood Sugar ___1__ times a day  Aspirin Instructions:STOP  COVID TEST- FOR 09/24/20   Anesthesia review: HTN HX         A1C  1/22  Patient denies shortness of breath, fever, cough and chest pain at PAT appointment   All instructions explained to the patient, with a verbal understanding of the material. Patient agrees to go over the instructions while at home for a better understanding. Patient also instructed to self quarantine after being tested for COVID-19. The opportunity to ask questions was provided.

## 2020-09-22 ENCOUNTER — Encounter (HOSPITAL_COMMUNITY): Payer: Self-pay
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
## 2020-09-23 ENCOUNTER — Other Ambulatory Visit (HOSPITAL_COMMUNITY)
Admission: RE | Admit: 2020-09-23 | Discharge: 2020-09-23 | Disposition: A | Discharge status: Home / Self Care | Payer: Managed Care, Other (non HMO) | Source: Ambulatory Visit | Attending: Neurosurgery | Admitting: Neurosurgery

## 2020-09-23 DIAGNOSIS — Z01812 Encounter for preprocedural laboratory examination: Secondary | ICD-10-CM | POA: Diagnosis not present

## 2020-09-23 DIAGNOSIS — Z20822 Contact with and (suspected) exposure to covid-19: Secondary | ICD-10-CM | POA: Diagnosis not present

## 2020-09-23 LAB — SARS CORONAVIRUS 2 (TAT 6-24 HRS): SARS Coronavirus 2: NEGATIVE

## 2020-09-27 ENCOUNTER — Ambulatory Visit (HOSPITAL_COMMUNITY): Payer: Managed Care, Other (non HMO) | Admitting: Certified Registered"

## 2020-09-27 ENCOUNTER — Other Ambulatory Visit: Payer: Self-pay

## 2020-09-27 ENCOUNTER — Ambulatory Visit (HOSPITAL_COMMUNITY): Payer: Managed Care, Other (non HMO) | Admitting: Physician Assistant

## 2020-09-27 ENCOUNTER — Ambulatory Visit (HOSPITAL_COMMUNITY): Payer: Managed Care, Other (non HMO)

## 2020-09-27 ENCOUNTER — Ambulatory Visit (HOSPITAL_COMMUNITY)
Admission: RE | Disposition: A | Discharge status: Home / Self Care | Payer: Self-pay | Source: Home / Self Care | Attending: Neurosurgery

## 2020-09-27 ENCOUNTER — Ambulatory Visit (HOSPITAL_COMMUNITY)
Admission: RE | Admit: 2020-09-27 | Discharge: 2020-09-28 | Disposition: A | Discharge status: Home / Self Care | Payer: Managed Care, Other (non HMO) | Attending: Neurosurgery | Admitting: Neurosurgery

## 2020-09-27 ENCOUNTER — Encounter (HOSPITAL_COMMUNITY): Payer: Self-pay | Admitting: Neurosurgery

## 2020-09-27 DIAGNOSIS — M48062 Spinal stenosis, lumbar region with neurogenic claudication: Secondary | ICD-10-CM | POA: Insufficient documentation

## 2020-09-27 DIAGNOSIS — M5416 Radiculopathy, lumbar region: Secondary | ICD-10-CM | POA: Insufficient documentation

## 2020-09-27 DIAGNOSIS — M4316 Spondylolisthesis, lumbar region: Secondary | ICD-10-CM | POA: Diagnosis not present

## 2020-09-27 DIAGNOSIS — Z8349 Family history of other endocrine, nutritional and metabolic diseases: Secondary | ICD-10-CM | POA: Insufficient documentation

## 2020-09-27 DIAGNOSIS — Z419 Encounter for procedure for purposes other than remedying health state, unspecified: Secondary | ICD-10-CM

## 2020-09-27 DIAGNOSIS — M4326 Fusion of spine, lumbar region: Secondary | ICD-10-CM | POA: Diagnosis not present

## 2020-09-27 DIAGNOSIS — Z809 Family history of malignant neoplasm, unspecified: Secondary | ICD-10-CM | POA: Insufficient documentation

## 2020-09-27 DIAGNOSIS — Z794 Long term (current) use of insulin: Secondary | ICD-10-CM | POA: Diagnosis not present

## 2020-09-27 DIAGNOSIS — Z7982 Long term (current) use of aspirin: Secondary | ICD-10-CM | POA: Insufficient documentation

## 2020-09-27 DIAGNOSIS — Z833 Family history of diabetes mellitus: Secondary | ICD-10-CM | POA: Insufficient documentation

## 2020-09-27 DIAGNOSIS — Z8249 Family history of ischemic heart disease and other diseases of the circulatory system: Secondary | ICD-10-CM | POA: Diagnosis not present

## 2020-09-27 DIAGNOSIS — M545 Low back pain, unspecified: Secondary | ICD-10-CM | POA: Diagnosis not present

## 2020-09-27 DIAGNOSIS — Z79899 Other long term (current) drug therapy: Secondary | ICD-10-CM | POA: Insufficient documentation

## 2020-09-27 DIAGNOSIS — Z87891 Personal history of nicotine dependence: Secondary | ICD-10-CM | POA: Insufficient documentation

## 2020-09-27 DIAGNOSIS — Z823 Family history of stroke: Secondary | ICD-10-CM | POA: Diagnosis not present

## 2020-09-27 LAB — GLUCOSE, CAPILLARY
Glucose-Capillary: 102 mg/dL — ABNORMAL HIGH (ref 70–99)
Glucose-Capillary: 156 mg/dL — ABNORMAL HIGH (ref 70–99)
Glucose-Capillary: 190 mg/dL — ABNORMAL HIGH (ref 70–99)
Glucose-Capillary: 147 mg/dL — ABNORMAL HIGH (ref 70–99)

## 2020-09-27 SURGERY — POSTERIOR LUMBAR FUSION 1 LEVEL
Anesthesia: General | Site: Spine Lumbar

## 2020-09-27 MED ORDER — INSULIN ASPART 100 UNIT/ML ~~LOC~~ SOLN
0.0000 [IU] | Freq: Three times a day (TID) | SUBCUTANEOUS | Status: DC
  Administered 2020-09-27: 4 [IU] via SUBCUTANEOUS

## 2020-09-27 MED ORDER — CHLORHEXIDINE GLUCONATE 0.12 % MT SOLN
15.0000 mL | Freq: Once | OROMUCOSAL | Status: AC
  Administered 2020-09-27: 15 mL via OROMUCOSAL
  Filled 2020-09-27: qty 15

## 2020-09-27 MED ORDER — BACITRACIN ZINC 500 UNIT/GM EX OINT
TOPICAL_OINTMENT | CUTANEOUS | Status: DC | PRN
  Administered 2020-09-27: 1 via TOPICAL

## 2020-09-27 MED ORDER — OXYCODONE HCL 5 MG PO TABS: 5.0000 mg | ORAL_TABLET | ORAL | Status: DC | PRN

## 2020-09-27 MED ORDER — METOPROLOL SUCCINATE ER 25 MG PO TB24: 25.0000 mg | ORAL_TABLET | Freq: Every day | ORAL | Status: DC

## 2020-09-27 MED ORDER — FERROUS SULFATE 325 (65 FE) MG PO TABS: 325.0000 mg | ORAL_TABLET | Freq: Every day | ORAL | Status: DC

## 2020-09-27 MED ORDER — FENTANYL CITRATE (PF) 250 MCG/5ML IJ SOLN
INTRAMUSCULAR | Status: DC | PRN
  Administered 2020-09-27 (×2): 50 ug via INTRAVENOUS
  Administered 2020-09-27: 100 ug via INTRAVENOUS
  Administered 2020-09-27 (×4): 50 ug via INTRAVENOUS

## 2020-09-27 MED ORDER — CYCLOBENZAPRINE HCL 10 MG PO TABS
ORAL_TABLET | ORAL | Status: AC
  Filled 2020-09-27: qty 1

## 2020-09-27 MED ORDER — HYDROCHLOROTHIAZIDE 12.5 MG PO CAPS
12.5000 mg | ORAL_CAPSULE | Freq: Every day | ORAL | Status: DC
  Administered 2020-09-27: 12.5 mg via ORAL
  Filled 2020-09-27: qty 1

## 2020-09-27 MED ORDER — DOCUSATE SODIUM 100 MG PO CAPS
100.0000 mg | ORAL_CAPSULE | Freq: Two times a day (BID) | ORAL | Status: DC
  Administered 2020-09-27: 100 mg via ORAL
  Filled 2020-09-27: qty 1

## 2020-09-27 MED ORDER — EPHEDRINE 5 MG/ML INJ
INTRAVENOUS | Status: AC
  Filled 2020-09-27: qty 10

## 2020-09-27 MED ORDER — CEFAZOLIN SODIUM-DEXTROSE 2-4 GM/100ML-% IV SOLN
2.0000 g | INTRAVENOUS | Status: AC
  Administered 2020-09-27: 2 g via INTRAVENOUS
  Filled 2020-09-27: qty 100

## 2020-09-27 MED ORDER — 0.9 % SODIUM CHLORIDE (POUR BTL) OPTIME
TOPICAL | Status: DC | PRN
  Administered 2020-09-27: 1000 mL

## 2020-09-27 MED ORDER — SODIUM CHLORIDE 0.9 % IV SOLN: INTRAVENOUS | Status: DC | PRN

## 2020-09-27 MED ORDER — GABAPENTIN 300 MG PO CAPS: 300.0000 mg | ORAL_CAPSULE | Freq: Every evening | ORAL | Status: DC | PRN

## 2020-09-27 MED ORDER — BUPIVACAINE LIPOSOME 1.3 % IJ SUSP
20.0000 mL | INTRAMUSCULAR | Status: AC
  Administered 2020-09-27: 20 mL
  Filled 2020-09-27: qty 20

## 2020-09-27 MED ORDER — FENTANYL CITRATE (PF) 100 MCG/2ML IJ SOLN
INTRAMUSCULAR | Status: AC
  Filled 2020-09-27: qty 2

## 2020-09-27 MED ORDER — ORAL CARE MOUTH RINSE: 15.0000 mL | Freq: Once | OROMUCOSAL | Status: AC

## 2020-09-27 MED ORDER — PHENYLEPHRINE 40 MCG/ML (10ML) SYRINGE FOR IV PUSH (FOR BLOOD PRESSURE SUPPORT)
PREFILLED_SYRINGE | INTRAVENOUS | Status: DC | PRN
  Administered 2020-09-27 (×3): 200 ug via INTRAVENOUS

## 2020-09-27 MED ORDER — DEXAMETHASONE SODIUM PHOSPHATE 10 MG/ML IJ SOLN
INTRAMUSCULAR | Status: AC
  Filled 2020-09-27: qty 1

## 2020-09-27 MED ORDER — ONDANSETRON HCL 4 MG/2ML IJ SOLN: 4.0000 mg | Freq: Four times a day (QID) | INTRAMUSCULAR | Status: DC | PRN

## 2020-09-27 MED ORDER — LIDOCAINE 2% (20 MG/ML) 5 ML SYRINGE
INTRAMUSCULAR | Status: DC | PRN
  Administered 2020-09-27: 60 mg via INTRAVENOUS

## 2020-09-27 MED ORDER — LACTATED RINGERS IV SOLN: INTRAVENOUS | Status: DC

## 2020-09-27 MED ORDER — ONDANSETRON HCL 4 MG PO TABS: 4.0000 mg | ORAL_TABLET | Freq: Four times a day (QID) | ORAL | Status: DC | PRN

## 2020-09-27 MED ORDER — ONDANSETRON HCL 4 MG/2ML IJ SOLN
INTRAMUSCULAR | Status: AC
  Filled 2020-09-27: qty 2

## 2020-09-27 MED ORDER — ROCURONIUM BROMIDE 10 MG/ML (PF) SYRINGE
PREFILLED_SYRINGE | INTRAVENOUS | Status: DC | PRN
  Administered 2020-09-27: 40 mg via INTRAVENOUS
  Administered 2020-09-27: 60 mg via INTRAVENOUS
  Administered 2020-09-27: 20 mg via INTRAVENOUS

## 2020-09-27 MED ORDER — PHENOL 1.4 % MT LIQD: 1.0000 | OROMUCOSAL | Status: DC | PRN

## 2020-09-27 MED ORDER — ROCURONIUM BROMIDE 10 MG/ML (PF) SYRINGE
PREFILLED_SYRINGE | INTRAVENOUS | Status: AC
  Filled 2020-09-27: qty 10

## 2020-09-27 MED ORDER — GLIPIZIDE 5 MG PO TABS: 5.0000 mg | ORAL_TABLET | Freq: Every day | ORAL | Status: DC

## 2020-09-27 MED ORDER — HYDROMORPHONE HCL 1 MG/ML IJ SOLN
0.2500 mg | INTRAMUSCULAR | Status: DC | PRN
  Administered 2020-09-27 (×2): 0.5 mg via INTRAVENOUS

## 2020-09-27 MED ORDER — CEFAZOLIN SODIUM-DEXTROSE 2-4 GM/100ML-% IV SOLN
2.0000 g | Freq: Three times a day (TID) | INTRAVENOUS | Status: AC
Start: 2020-09-27 — End: 2020-09-28
  Administered 2020-09-27 – 2020-09-28 (×2): 2 g via INTRAVENOUS
  Filled 2020-09-27 (×2): qty 100

## 2020-09-27 MED ORDER — LOSARTAN POTASSIUM 50 MG PO TABS
100.0000 mg | ORAL_TABLET | Freq: Every day | ORAL | Status: DC
  Administered 2020-09-27: 100 mg via ORAL
  Filled 2020-09-27: qty 2

## 2020-09-27 MED ORDER — OXYCODONE HCL 5 MG PO TABS: 5.0000 mg | ORAL_TABLET | Freq: Once | ORAL | Status: DC | PRN

## 2020-09-27 MED ORDER — PROPOFOL 10 MG/ML IV BOLUS
INTRAVENOUS | Status: DC | PRN
  Administered 2020-09-27: 150 mg via INTRAVENOUS

## 2020-09-27 MED ORDER — SODIUM CHLORIDE 0.9% FLUSH: 3.0000 mL | INTRAVENOUS | Status: DC | PRN

## 2020-09-27 MED ORDER — THROMBIN 5000 UNITS EX SOLR
CUTANEOUS | Status: AC
  Filled 2020-09-27: qty 10000

## 2020-09-27 MED ORDER — LOSARTAN POTASSIUM-HCTZ 100-12.5 MG PO TABS: 1.0000 | ORAL_TABLET | Freq: Every day | ORAL | Status: DC

## 2020-09-27 MED ORDER — MORPHINE SULFATE (PF) 4 MG/ML IV SOLN
4.0000 mg | INTRAVENOUS | Status: DC | PRN
  Administered 2020-09-28 (×2): 4 mg via INTRAVENOUS
  Filled 2020-09-27 (×2): qty 1

## 2020-09-27 MED ORDER — SODIUM CHLORIDE 0.9% FLUSH: 3.0000 mL | Freq: Two times a day (BID) | INTRAVENOUS | Status: DC

## 2020-09-27 MED ORDER — SUGAMMADEX SODIUM 200 MG/2ML IV SOLN
INTRAVENOUS | Status: DC | PRN
  Administered 2020-09-27: 200 mg via INTRAVENOUS

## 2020-09-27 MED ORDER — OXYCODONE HCL 5 MG/5ML PO SOLN: 5.0000 mg | Freq: Once | ORAL | Status: DC | PRN

## 2020-09-27 MED ORDER — THROMBIN 5000 UNITS EX SOLR: OROMUCOSAL | Status: DC | PRN

## 2020-09-27 MED ORDER — DEXAMETHASONE SODIUM PHOSPHATE 10 MG/ML IJ SOLN
INTRAMUSCULAR | Status: DC | PRN
  Administered 2020-09-27: 10 mg via INTRAVENOUS

## 2020-09-27 MED ORDER — ACETAMINOPHEN 500 MG PO TABS
1000.0000 mg | ORAL_TABLET | Freq: Four times a day (QID) | ORAL | Status: DC
  Administered 2020-09-27 – 2020-09-28 (×3): 1000 mg via ORAL
  Filled 2020-09-27 (×3): qty 2

## 2020-09-27 MED ORDER — HYDROMORPHONE HCL 1 MG/ML IJ SOLN
INTRAMUSCULAR | Status: AC
  Filled 2020-09-27: qty 1

## 2020-09-27 MED ORDER — MIDAZOLAM HCL 2 MG/2ML IJ SOLN
INTRAMUSCULAR | Status: AC
  Filled 2020-09-27: qty 2

## 2020-09-27 MED ORDER — BUPIVACAINE-EPINEPHRINE (PF) 0.25% -1:200000 IJ SOLN
INTRAMUSCULAR | Status: DC | PRN
  Administered 2020-09-27: 10 mL

## 2020-09-27 MED ORDER — SIMVASTATIN 20 MG PO TABS: 20.0000 mg | ORAL_TABLET | Freq: Every day | ORAL | Status: DC

## 2020-09-27 MED ORDER — METFORMIN HCL 500 MG PO TABS: 1000.0000 mg | ORAL_TABLET | Freq: Two times a day (BID) | ORAL | Status: DC

## 2020-09-27 MED ORDER — FENTANYL CITRATE (PF) 100 MCG/2ML IJ SOLN
25.0000 ug | INTRAMUSCULAR | Status: DC | PRN
  Administered 2020-09-27 (×3): 50 ug via INTRAVENOUS

## 2020-09-27 MED ORDER — HYDROMORPHONE HCL 1 MG/ML IJ SOLN
0.2500 mg | INTRAMUSCULAR | Status: DC | PRN
  Administered 2020-09-27 (×4): 0.5 mg via INTRAVENOUS

## 2020-09-27 MED ORDER — OXYCODONE HCL 5 MG PO TABS
10.0000 mg | ORAL_TABLET | ORAL | Status: DC | PRN
Start: 2020-09-27 — End: 2020-09-28
  Administered 2020-09-27 – 2020-09-28 (×3): 10 mg via ORAL
  Filled 2020-09-27 (×3): qty 2

## 2020-09-27 MED ORDER — PHENYLEPHRINE HCL-NACL 10-0.9 MG/250ML-% IV SOLN
INTRAVENOUS | Status: DC | PRN
  Administered 2020-09-27: 50 ug/min via INTRAVENOUS

## 2020-09-27 MED ORDER — BISACODYL 10 MG RE SUPP: 10.0000 mg | Freq: Every day | RECTAL | Status: DC | PRN

## 2020-09-27 MED ORDER — EPHEDRINE SULFATE-NACL 50-0.9 MG/10ML-% IV SOSY
PREFILLED_SYRINGE | INTRAVENOUS | Status: DC | PRN
  Administered 2020-09-27 (×2): 10 mg via INTRAVENOUS

## 2020-09-27 MED ORDER — ACETAMINOPHEN 650 MG RE SUPP: 650.0000 mg | RECTAL | Status: DC | PRN

## 2020-09-27 MED ORDER — CYCLOBENZAPRINE HCL 10 MG PO TABS
10.0000 mg | ORAL_TABLET | Freq: Three times a day (TID) | ORAL | Status: DC | PRN
Start: 2020-09-27 — End: 2020-09-28
  Administered 2020-09-27 (×2): 10 mg via ORAL
  Filled 2020-09-27: qty 1

## 2020-09-27 MED ORDER — ACETAMINOPHEN 325 MG PO TABS: 650.0000 mg | ORAL_TABLET | ORAL | Status: DC | PRN

## 2020-09-27 MED ORDER — MENTHOL 3 MG MT LOZG: 1.0000 | LOZENGE | OROMUCOSAL | Status: DC | PRN

## 2020-09-27 MED ORDER — FENTANYL CITRATE (PF) 250 MCG/5ML IJ SOLN
INTRAMUSCULAR | Status: AC
  Filled 2020-09-27: qty 5

## 2020-09-27 MED ORDER — HYDROMORPHONE HCL 1 MG/ML IJ SOLN
INTRAMUSCULAR | Status: AC
  Filled 2020-09-27: qty 2

## 2020-09-27 MED ORDER — BACITRACIN ZINC 500 UNIT/GM EX OINT
TOPICAL_OINTMENT | CUTANEOUS | Status: AC
  Filled 2020-09-27: qty 28.35

## 2020-09-27 SURGICAL SUPPLY — 62 items
APL SKNCLS STERI-STRIP NONHPOA (GAUZE/BANDAGES/DRESSINGS) ×1
BENZOIN TINCTURE PRP APPL 2/3 (GAUZE/BANDAGES/DRESSINGS) ×2 IMPLANT
BUR MATCHSTICK NEURO 3.0 LAGG (BURR) ×2 IMPLANT
BUR PRECISION FLUTE 6.0 (BURR) ×2 IMPLANT
CANISTER SUCT 3000ML PPV (MISCELLANEOUS) ×2 IMPLANT
CAP LOCK DLX THRD (Cap) ×4 IMPLANT
CARTRIDGE OIL MAESTRO DRILL (MISCELLANEOUS) ×1 IMPLANT
CNTNR URN SCR LID CUP LEK RST (MISCELLANEOUS) ×1 IMPLANT
CONT SPEC 4OZ STRL OR WHT (MISCELLANEOUS) ×2
COVER BACK TABLE 60X90IN (DRAPES) ×2 IMPLANT
DECANTER SPIKE VIAL GLASS SM (MISCELLANEOUS) ×2 IMPLANT
DIFFUSER DRILL AIR PNEUMATIC (MISCELLANEOUS) ×2 IMPLANT
DRAPE C-ARM 42X72 X-RAY (DRAPES) ×4 IMPLANT
DRAPE HALF SHEET 40X57 (DRAPES) ×2 IMPLANT
DRAPE LAPAROTOMY 100X72X124 (DRAPES) ×2 IMPLANT
DRAPE SURG 17X23 STRL (DRAPES) ×8 IMPLANT
DRSG OPSITE POSTOP 4X6 (GAUZE/BANDAGES/DRESSINGS) ×2 IMPLANT
DRSG OPSITE POSTOP 4X8 (GAUZE/BANDAGES/DRESSINGS) ×1 IMPLANT
ELECT BLADE 4.0 EZ CLEAN MEGAD (MISCELLANEOUS) ×2
ELECT REM PT RETURN 9FT ADLT (ELECTROSURGICAL) ×2
ELECTRODE BLDE 4.0 EZ CLN MEGD (MISCELLANEOUS) ×1 IMPLANT
ELECTRODE REM PT RTRN 9FT ADLT (ELECTROSURGICAL) ×1 IMPLANT
EVACUATOR 1/8 PVC DRAIN (DRAIN) IMPLANT
GAUZE 4X4 16PLY RFD (DISPOSABLE) ×2 IMPLANT
GLOVE BIO SURGEON STRL SZ8 (GLOVE) ×7 IMPLANT
GLOVE BIO SURGEON STRL SZ8.5 (GLOVE) ×4 IMPLANT
GLOVE BIOGEL M 6.5 STRL (GLOVE) ×3 IMPLANT
GLOVE BIOGEL PI IND STRL 8.5 (GLOVE) IMPLANT
GLOVE BIOGEL PI INDICATOR 8.5 (GLOVE) ×1
GLOVE EXAM NITRILE XL STR (GLOVE) IMPLANT
GLOVE SKINSENSE NS SZ6.5 (GLOVE) ×3
GLOVE SKINSENSE STRL SZ6.5 (GLOVE) IMPLANT
GOWN STRL REUS W/ TWL LRG LVL3 (GOWN DISPOSABLE) IMPLANT
GOWN STRL REUS W/ TWL XL LVL3 (GOWN DISPOSABLE) ×2 IMPLANT
GOWN STRL REUS W/TWL 2XL LVL3 (GOWN DISPOSABLE) ×1 IMPLANT
GOWN STRL REUS W/TWL LRG LVL3 (GOWN DISPOSABLE) ×4
GOWN STRL REUS W/TWL XL LVL3 (GOWN DISPOSABLE) ×4
HEMOSTAT POWDER KIT SURGIFOAM (HEMOSTASIS) ×3 IMPLANT
KIT BASIN OR (CUSTOM PROCEDURE TRAY) ×2 IMPLANT
KIT TURNOVER KIT B (KITS) ×2 IMPLANT
NDL HYPO 21X1.5 SAFETY (NEEDLE) IMPLANT
NEEDLE HYPO 21X1.5 SAFETY (NEEDLE) ×2 IMPLANT
NEEDLE HYPO 22GX1.5 SAFETY (NEEDLE) ×2 IMPLANT
NS IRRIG 1000ML POUR BTL (IV SOLUTION) ×2 IMPLANT
OIL CARTRIDGE MAESTRO DRILL (MISCELLANEOUS) ×2
PACK LAMINECTOMY NEURO (CUSTOM PROCEDURE TRAY) ×2 IMPLANT
PUTTY DBM 10CC CALC GRAN (Putty) ×2 IMPLANT
ROD CREO DLX CVD 6.35X40 (Rod) IMPLANT
ROD CURVED TI 6.35X40 (Rod) ×4 IMPLANT
SCREW PA DLX CREO 7.5X55 (Screw) ×4 IMPLANT
SPACER ALTERA 10X31-15 (Spacer) ×1 IMPLANT
SPONGE NEURO XRAY DETECT 1X3 (DISPOSABLE) IMPLANT
SPONGE SURGIFOAM ABS GEL 100 (HEMOSTASIS) IMPLANT
STRIP CLOSURE SKIN 1/2X4 (GAUZE/BANDAGES/DRESSINGS) ×2 IMPLANT
SUT VIC AB 1 CT1 18XBRD ANBCTR (SUTURE) ×2 IMPLANT
SUT VIC AB 1 CT1 8-18 (SUTURE) ×4
SUT VIC AB 2-0 CP2 18 (SUTURE) ×4 IMPLANT
SYR 20ML LL LF (SYRINGE) ×1 IMPLANT
TOWEL GREEN STERILE (TOWEL DISPOSABLE) ×2 IMPLANT
TOWEL GREEN STERILE FF (TOWEL DISPOSABLE) ×2 IMPLANT
TRAY FOLEY MTR SLVR 16FR STAT (SET/KITS/TRAYS/PACK) ×2 IMPLANT
WATER STERILE IRR 1000ML POUR (IV SOLUTION) ×2 IMPLANT

## 2020-09-27 NOTE — Anesthesia Preprocedure Evaluation (Signed)
Anesthesia Evaluation  Patient identified by MRN, date of birth, ID band Patient awake    Reviewed: Allergy & Precautions, H&P , NPO status   Airway Mallampati: II   Neck ROM: full    Dental   Pulmonary former smoker,    breath sounds clear to auscultation       Cardiovascular hypertension,  Rhythm:regular Rate:Normal     Neuro/Psych  Neuromuscular disease    GI/Hepatic hiatal hernia, (+) Hepatitis -, C  Endo/Other  diabetes, Type 2  Renal/GU      Musculoskeletal  (+) Arthritis ,   Abdominal   Peds  Hematology   Anesthesia Other Findings   Reproductive/Obstetrics                             Anesthesia Physical Anesthesia Plan  ASA: III  Anesthesia Plan: General   Post-op Pain Management:    Induction: Intravenous  PONV Risk Score and Plan: 2 and Ondansetron, Midazolam and Treatment may vary due to age or medical condition  Airway Management Planned: Oral ETT  Additional Equipment:   Intra-op Plan:   Post-operative Plan: Extubation in OR  Informed Consent: I have reviewed the patients History and Physical, chart, labs and discussed the procedure including the risks, benefits and alternatives for the proposed anesthesia with the patient or authorized representative who has indicated his/her understanding and acceptance.     Dental advisory given  Plan Discussed with: CRNA, Anesthesiologist and Surgeon  Anesthesia Plan Comments:         Anesthesia Quick Evaluation

## 2020-09-27 NOTE — Progress Notes (Signed)
Orthopedic Tech Progress Note Patient Details:  Timothy Peters 17-Nov-1950 [id number]  Patient has brace at home and will receive it in the morning.  Patient ID: Timothy Peters, male   DOB: 1951-05-09, 70 y.o.   MRN: [id number]   [name] 09/27/2020, 7:29 PM

## 2020-09-27 NOTE — Progress Notes (Signed)
Subjective: The patient is alert and pleasant.  He looks well.  Objective: Vital signs in last 24 hours: Temp:  [97.7 F (36.5 C)-97.8 F (36.6 C)] 97.7 F (36.5 C) (02/09 1516) Pulse Rate:  [71-89] 89 (02/09 1516) Resp:  [12-18] 12 (02/09 1516) BP: (152-153)/(82-89) 153/89 (02/09 1516) SpO2:  [100 %] 100 % (02/09 1516) Weight:  [85.7 kg] 85.7 kg (02/09 1022) Estimated body mass index is 26.36 kg/m as calculated from the following:   Height as of this encounter: 5\' 11"  (1.803 m).   Weight as of this encounter: 85.7 kg.   Intake/Output from previous day: No intake/output data recorded. Intake/Output this shift: Total I/O In: 2280 [I.V.:2150; Blood:130] Out: 525 [Urine:225; Blood:300]  Physical exam the patient is alert and pleasant.  His lower extremity strength is normal.  Lab Results: No results for input(s): WBC, HGB, HCT, PLT in the last 72 hours. BMET No results for input(s): NA, K, CL, CO2, GLUCOSE, BUN, CREATININE, CALCIUM in the last 72 hours.  Studies/Results: No results found.  Assessment/Plan: The patient is doing well.  I spoke with his wife.  LOS: 0 days     Ophelia Charter 09/27/2020, 3:28 PM

## 2020-09-27 NOTE — Transfer of Care (Signed)
Immediate Anesthesia Transfer of Care Note  Patient: SAHEED CARRINGTON  Procedure(s) Performed: LUMBAR FOUR-FIVE POSTERIOR LUMBAR INTERBODY FUSION, INTERBODY PROSTHESIS WITH POSTERIOR INSTRUMENTATION (N/A Spine Lumbar)  Patient Location: PACU  Anesthesia Type:General  Level of Consciousness: drowsy and patient cooperative  Airway & Oxygen Therapy: Patient Spontanous Breathing  Post-op Assessment: Report given to RN and Post -op Vital signs reviewed and stable  Post vital signs: Reviewed and stable  Last Vitals:  Vitals Value Taken Time  BP 153/89 09/27/20 1516  Temp    Pulse 87 09/27/20 1516  Resp 19 09/27/20 1517  SpO2 100 % 09/27/20 1516  Vitals shown include unvalidated device data.  Last Pain:  Vitals:   09/27/20 1038  TempSrc:   PainSc: 3       Patients Stated Pain Goal: 3 (72/90/21 1155)  Complications: No complications documented.
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
## 2020-09-27 NOTE — Op Note (Signed)
Brief history: The patient is a 70 year old black male on whom another physician performed an L3-4 and L4-5 laminectomy.  The patient has developed recurrent back and leg pain.  He was worked up with a lumbar MRI and lumbar x-rays which demonstrated an L4-5 spondylolisthesis with recurrent spinal stenosis.  I discussed the various treatment options with him.  He has decided proceed with surgery.  Preoperative diagnosis: L4-5 spondylolisthesis , recurrent spinal stenosis compressing both the L4 and the L5 nerve roots; lumbago; lumbar radiculopathy; neurogenic claudication  Postoperative diagnosis: The same  Procedure: Bilateral redo L4-5 laminectomy/foraminotomies/medial facetectomy to decompress the bilateral L4 and L5 nerve roots(the work required to do this was in addition to the work required to do the posterior lumbar interbody fusion because of the patient's recurrent spinal stenosis, facet arthropathy. Etc. requiring a wide decompression of the nerve roots.);  L4-5 transforaminal lumbar interbody fusion with local morselized autograft bone and Zimmer DBM; insertion of interbody prosthesis at L4-5 (globus peek expandable interbody prosthesis); posterior nonsegmental instrumentation from L4 to L5 with globus titanium pedicle screws and rods; posterior lateral arthrodesis at L4-5 with local morselized autograft bone and Zimmer DBM.  Surgeon: Dr. Earle Gell  Asst.: Dr. Ashok Pall and Arnetha Massy, NP  Anesthesia: Gen. endotracheal  Estimated blood loss: 300 cc  Drains: None  Complications: None  Description of procedure: The patient was brought to the operating room by the anesthesia team. General endotracheal anesthesia was induced. The patient was turned to the prone position on the Wilson frame. The patient's lumbosacral region was then prepared with Betadine scrub and Betadine solution. Sterile drapes were applied.  I then injected the area to be incised with Marcaine with  epinephrine solution. I then used the scalpel to make a linear midline incision over the L4-5 interspace, incising through the old surgical scar. I then used electrocautery to perform a bilateral subperiosteal dissection exposing the facets and remaining lamina of L4-5. We then obtained intraoperative radiograph to confirm our location. We then inserted the Verstrac retractor to provide exposure.  I began the decompression by using the high speed drill to perform redo bilateral laminotomies at L4-5. We then used the Kerrison punches to widen the laminotomy and removed the epidural scar tissue and remainder of the ligamentum flavum at L4-5. We used the Kerrison punches to remove the medial facets at L4-5 bilaterally. We performed wide foraminotomies about the bilateral L4 and L5 nerve roots completing the decompression.  We now turned our attention to the posterior lumbar interbody fusion. I used a scalpel to incise the intervertebral disc at L4-5 bilaterally. I then performed a partial intervertebral discectomy at L4-5 bilaterally using the pituitary forceps. We prepared the vertebral endplates at P3-2 bilaterally for the fusion by removing the soft tissues with the curettes. We then used the trial spacers to pick the appropriate sized interbody prosthesis. We prefilled his prosthesis with a combination of local morselized autograft bone that we obtained during the decompression as well as Zimmer DBM. We inserted the prefilled prosthesis into the interspace at L4-5 from the right, we then turned and expanded the prosthesis. There was a good snug fit of the prosthesis in the interspace. We then filled and the remainder of the intervertebral disc space with local morselized autograft bone and Zimmer DBM. This completed the posterior lumbar interbody arthrodesis.  During the decompression and insertion of the prosthesis the assistant protected the thecal sac and nerve roots with the D'Errico retractor.  We now  turned attention  to the instrumentation. Under fluoroscopic guidance we cannulated the bilateral L4 and L5 pedicles with the bone probe. We then removed the bone probe. We then tapped the pedicle with a 6.5 millimeter tap. We then removed the tap. We probed inside the tapped pedicle with a ball probe to rule out cortical breaches. We then inserted a 7.5 x 55 millimeter pedicle screw into the L4 and L5 pedicles bilaterally under fluoroscopic guidance. We then palpated along the medial aspect of the pedicles to rule out cortical breaches. There were none. The nerve roots were not injured. We then connected the unilateral pedicle screws with a lordotic rod. We compressed the construct and secured the rod in place with the caps. We then tightened the caps appropriately. This completed the instrumentation from L4-5 bilaterally.  We now turned our attention to the posterior lateral arthrodesis at L4-5 bilaterally. We used the high-speed drill to decorticate the remainder of the facets, pars, transverse process at L4-5 bilaterally. We then applied a combination of local morselized autograft bone and Zimmer DBM over these decorticated posterior lateral structures. This completed the posterior lateral arthrodesis.  We then obtained hemostasis using bipolar electrocautery. We irrigated the wound out with bacitracin solution. We inspected the thecal sac and nerve roots and noted they were well decompressed. We then removed the retractor.  We injected Exparel . We reapproximated patient's thoracolumbar fascia with interrupted #1 Vicryl suture. We reapproximated patient's subcutaneous tissue with interrupted 2-0 Vicryl suture. The reapproximated patient's skin with Steri-Strips and benzoin. The wound was then coated with bacitracin ointment. A sterile dressing was applied. The drapes were removed. The patient was subsequently returned to the supine position where they were extubated by the anesthesia team. He was then  transported to the post anesthesia care unit in stable condition. All sponge instrument and needle counts were reportedly correct at the end of this case.

## 2020-09-27 NOTE — Anesthesia Procedure Notes (Signed)
Procedure Name: Intubation Date/Time: 09/27/2020 11:14 AM Performed by: Lance Coon, CRNA Pre-anesthesia Checklist: Patient identified, Emergency Drugs available, Suction available, Patient being monitored and Timeout performed Patient Re-evaluated:Patient Re-evaluated prior to induction Oxygen Delivery Method: Circle system utilized Preoxygenation: Pre-oxygenation with 100% oxygen Induction Type: IV induction Ventilation: Mask ventilation without difficulty Laryngoscope Size: Miller and 3 Grade View: Grade I Tube type: Oral Tube size: 7.5 mm Number of attempts: 1 Airway Equipment and Method: Stylet Placement Confirmation: ETT inserted through vocal cords under direct vision,  positive ETCO2 and breath sounds checked- equal and bilateral Secured at: 21 cm Tube secured with: Tape Dental Injury: Teeth and Oropharynx as per pre-operative assessment

## 2020-09-28 ENCOUNTER — Other Ambulatory Visit (HOSPITAL_COMMUNITY): Payer: Self-pay | Admitting: Neurosurgery

## 2020-09-28 DIAGNOSIS — M4316 Spondylolisthesis, lumbar region: Secondary | ICD-10-CM | POA: Diagnosis not present

## 2020-09-28 LAB — GLUCOSE, CAPILLARY: Glucose-Capillary: 119 mg/dL — ABNORMAL HIGH (ref 70–99)

## 2020-09-28 LAB — BASIC METABOLIC PANEL
Anion gap: 10 (ref 5–15)
BUN: 18 mg/dL (ref 8–23)
CO2: 23 mmol/L (ref 22–32)
Calcium: 9.1 mg/dL (ref 8.9–10.3)
Chloride: 104 mmol/L (ref 98–111)
Creatinine, Ser: 0.92 mg/dL (ref 0.61–1.24)
GFR, Estimated: >60 mL/min (ref >60)
Glucose, Bld: 123 mg/dL — ABNORMAL HIGH (ref 70–99)
Potassium: 3.8 mmol/L (ref 3.5–5.1)
Sodium: 137 mmol/L (ref 135–145)

## 2020-09-28 LAB — CBC
HCT: 33 % — ABNORMAL LOW (ref 39.0–52.0)
Hemoglobin: 11.6 g/dL — ABNORMAL LOW (ref 13.0–17.0)
MCH: 33 pg (ref 26.0–34.0)
MCHC: 35.2 g/dL (ref 30.0–36.0)
MCV: 93.8 fL (ref 80.0–100.0)
Platelets: 186 10*3/uL (ref 150–400)
RBC: 3.52 MIL/uL — ABNORMAL LOW (ref 4.22–5.81)
RDW: 11.7 % (ref 11.5–15.5)
WBC: 8.4 10*3/uL (ref 4.0–10.5)
nRBC: 0 % (ref 0.0–0.2)

## 2020-09-28 LAB — HEMOGLOBIN A1C
Hgb A1c MFr Bld: 5.9 % — ABNORMAL HIGH (ref 4.8–5.6)
Mean Plasma Glucose: 122.63 mg/dL

## 2020-09-28 MED ORDER — CYCLOBENZAPRINE HCL 10 MG PO TABS: 10.0000 mg | ORAL_TABLET | Freq: Three times a day (TID) | ORAL | 0 refills | Status: DC | PRN

## 2020-09-28 MED ORDER — DOCUSATE SODIUM 100 MG PO CAPS: 100.0000 mg | ORAL_CAPSULE | Freq: Two times a day (BID) | ORAL | 0 refills | Status: DC

## 2020-09-28 MED ORDER — OXYCODONE-ACETAMINOPHEN 5-325 MG PO TABS: 1.0000 | ORAL_TABLET | ORAL | Status: DC | PRN

## 2020-09-28 MED ORDER — OXYCODONE-ACETAMINOPHEN 5-325 MG PO TABS: 1.0000 | ORAL_TABLET | ORAL | 0 refills | Status: DC | PRN

## 2020-09-28 MED FILL — CYCLOBENZAPRINE HCL 10 MG T: 10 | 17 days supply | Qty: 50 | Fill #0

## 2020-09-28 MED FILL — OXYCODONE-APAP 5-325MG: 5-325 | 3 days supply | Qty: 30 | Fill #0

## 2020-09-28 MED FILL — Thrombin For Soln 5000 Unit: CUTANEOUS | Qty: 5000 | Status: AC

## 2020-09-28 NOTE — Discharge Instructions (Signed)

## 2020-09-28 NOTE — Anesthesia Postprocedure Evaluation (Signed)
Anesthesia Post Note  Patient: DOZIER BERKOVICH  Procedure(s) Performed: LUMBAR FOUR-FIVE POSTERIOR LUMBAR INTERBODY FUSION, INTERBODY PROSTHESIS WITH POSTERIOR INSTRUMENTATION (N/A Spine Lumbar)     Patient location during evaluation: PACU Anesthesia Type: General Level of consciousness: awake and alert Pain management: pain level controlled Vital Signs Assessment: post-procedure vital signs reviewed and stable Respiratory status: spontaneous breathing, nonlabored ventilation, respiratory function stable and patient connected to nasal cannula oxygen Cardiovascular status: blood pressure returned to baseline and stable Postop Assessment: no apparent nausea or vomiting Anesthetic complications: no   No complications documented.  Last Vitals:  Vitals:   09/28/20 0338 09/28/20 0750  BP: 127/83 113/69  Pulse: 88 86  Resp: 20 16  Temp: 36.9 C 36.8 C  SpO2: 100% 100%    Last Pain:  Vitals:   09/28/20 0800  TempSrc:   PainSc: 5                  Wadsworth Skolnick S
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
## 2020-09-28 NOTE — Progress Notes (Signed)
Patient is discharged from room 3C02 at this time. Alert and in stable condition. IV site d/c'd and instructions read to patient with understanding verbalized. PT recommended home health but patient declined to wait for CM to get it set up. Stated his mom passed away recently in this hospital he he want to leave as soon as he can. Left unit via wheelchair with all belongings at side.

## 2020-09-28 NOTE — Discharge Summary (Signed)
Physician Discharge Summary  Patient ID: Timothy Peters MRN: [id number] DOB/AGE: 69-Aug-1952 69 y.o.  Admit date: 09/27/2020 Discharge date: 09/28/2020  Admission Diagnoses: Lumbar spondylolisthesis, lumbar spinal stenosis, lumbago, lumbar radiculopathy, neurogenic location  Discharge Diagnoses: The same Active Problems:   Spondylolisthesis, lumbar region   Discharged Condition: good  Hospital Course: I performed an L4-5 redo decompression, instrumentation and fusion on 09/27/2020.  The surgery went well.  The patient's postoperative course was unremarkable.  On 09/28/2020 he was doing well and requested discharge home.  I gave him discharge instructions and answered all his questions.  Consults: PT, OT, care management Significant Diagnostic Studies: None Treatments: Redo L4-5 decompression, instrumentation and fusion. Discharge Exam: Blood pressure 127/83, pulse 88, temperature 98.5 F (36.9 C), temperature source Oral, resp. rate 20, height 5\' 11"  (1.803 m), weight 85.7 kg, SpO2 100 %. The patient is alert and pleasant.  He looks well.  His strength is normal.  Disposition: Home  Discharge Instructions    Call MD for:  difficulty breathing, headache or visual disturbances   Complete by: As directed    Call MD for:  extreme fatigue   Complete by: As directed    Call MD for:  hives   Complete by: As directed    Call MD for:  persistant dizziness or light-headedness   Complete by: As directed    Call MD for:  persistant nausea and vomiting   Complete by: As directed    Call MD for:  redness, tenderness, or signs of infection (pain, swelling, redness, odor or green/yellow discharge around incision site)   Complete by: As directed    Call MD for:  severe uncontrolled pain   Complete by: As directed    Call MD for:  temperature >100.4   Complete by: As directed    Diet - low sodium heart healthy   Complete by: As directed    Discharge instructions   Complete by: As directed     Call [phone] for a followup appointment. Take a stool softener while you are using pain medications.   Driving Restrictions   Complete by: As directed    Do not drive for 2 weeks.   Increase activity slowly   Complete by: As directed    Lifting restrictions   Complete by: As directed    Do not lift more than 5 pounds. No excessive bending or twisting.   May shower / Bathe   Complete by: As directed    Remove the dressing for 3 days after surgery.  You may shower, but leave the incision alone.   Remove dressing in 48 hours   Complete by: As directed      Allergies as of 09/28/2020   No Known Allergies     Medication List    STOP taking these medications   ibuprofen 200 MG tablet Commonly known as: ADVIL     TAKE these medications   aspirin 81 MG tablet Take 1 tablet (81 mg total) by mouth daily. Resume 4 days post-op   Basaglar KwikPen 100 UNIT/ML Inject 10 Units into the skin every morning.   cyclobenzaprine 10 MG tablet Commonly known as: FLEXERIL Take 1 tablet (10 mg total) by mouth 3 (three) times daily as needed for muscle spasms.   docusate sodium 100 MG capsule Commonly known as: COLACE Take 1 capsule (100 mg total) by mouth 2 (two) times daily.   ferrous sulfate 325 (65 FE) MG tablet Take 325 mg by mouth daily with breakfast.  gabapentin 300 MG capsule Commonly known as: NEURONTIN Take 300 mg by mouth at bedtime as needed (for neuropathy or back pain.).   glipiZIDE 5 MG tablet Commonly known as: GLUCOTROL Take 1 tablet daily with either lunch or dinner. If blood sugar less than 150, hold your dose. Patient now has [employer] What changed:   how much to take  how to take this  when to take this   glucose blood test strip Use as instructed   Insulin Pen Needle 31G X 5 MM Misc 1 Units by Does not apply route as directed.   LINIMENTS EX Apply 1 application topically daily as needed (back pain). Horse Liniments for pain.    losartan-hydrochlorothiazide 100-12.5 MG tablet Commonly known as: HYZAAR Take 1 tablet by mouth daily.   metFORMIN 1000 MG tablet Commonly known as: GLUCOPHAGE Take 1,000 mg by mouth 2 (two) times daily.   metoprolol succinate 25 MG 24 hr tablet Commonly known as: TOPROL-XL Take 25 mg by mouth daily.   oxyCODONE-acetaminophen 5-325 MG tablet Commonly known as: PERCOCET/ROXICET Take 1-2 tablets by mouth every 4 (four) hours as needed for moderate pain.   simvastatin 20 MG tablet Commonly known as: ZOCOR Take 20 mg by mouth daily.   Trulicity 1.5 UT/6.5YY Sopn Generic drug: Dulaglutide Inject 1.5 mg into the skin once a week.        Signed: Ophelia Charter 09/28/2020, 7:48 AM
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
## 2020-10-02 MED FILL — Sodium Chloride IV Soln 0.9%: INTRAVENOUS | Qty: 1000 | Status: AC

## 2020-10-02 MED FILL — Heparin Sodium (Porcine) Inj 1000 Unit/ML: INTRAMUSCULAR | Qty: 30 | Status: AC

## 2020-10-04 ENCOUNTER — Other Ambulatory Visit (HOSPITAL_COMMUNITY): Payer: Self-pay | Admitting: Student

## 2020-10-05 MED FILL — OXYCODONE-APAP 5-325MG: 5-325 | 5 days supply | Qty: 50 | Fill #0

## 2020-10-09 MED FILL — TRULICITY 1.5 MG/0.5 ML PEN: 1.5 | 28 days supply | Qty: 2 | Fill #1

## 2020-10-16 ENCOUNTER — Other Ambulatory Visit (HOSPITAL_COMMUNITY): Payer: Self-pay | Admitting: Student

## 2020-10-17 MED FILL — OXYCODONE-APAP 5-325MG: 5-325 | 7 days supply | Qty: 40 | Fill #0

## 2020-10-24 ENCOUNTER — Other Ambulatory Visit (HOSPITAL_COMMUNITY): Payer: Self-pay | Admitting: Internal Medicine

## 2020-10-24 MED FILL — SILDENAFIL CITRATE 100 MG T: 100 | 29 days supply | Qty: 4 | Fill #0

## 2020-10-25 MED FILL — LOSARTAN-HCTZ 100-12.5 MG T: 100-12.5 | 30 days supply | Qty: 30 | Fill #2

## 2020-10-26 MED FILL — METOPROLOL SUCCINATE ER 25: 25 | 90 days supply | Qty: 90 | Fill #2

## 2020-11-13 ENCOUNTER — Other Ambulatory Visit (HOSPITAL_COMMUNITY): Payer: Self-pay | Admitting: Student

## 2020-11-13 MED FILL — HYDROCODON-APAP 5-325: 5-325 | 10 days supply | Qty: 40 | Fill #0

## 2020-11-22 ENCOUNTER — Other Ambulatory Visit (HOSPITAL_COMMUNITY): Payer: Self-pay

## 2020-11-22 MED FILL — Losartan Potassium & Hydrochlorothiazide Tab 100-12.5 MG: ORAL | 30 days supply | Qty: 30 | Fill #0 | Status: AC

## 2020-11-24 ENCOUNTER — Other Ambulatory Visit: Payer: Self-pay | Admitting: Surgery

## 2020-11-24 DIAGNOSIS — K8010 Calculus of gallbladder with chronic cholecystitis without obstruction: Secondary | ICD-10-CM

## 2020-11-27 ENCOUNTER — Other Ambulatory Visit (HOSPITAL_COMMUNITY): Payer: Self-pay

## 2020-11-27 MED ORDER — CYCLOBENZAPRINE HCL 10 MG PO TABS
10.0000 mg | ORAL_TABLET | Freq: Three times a day (TID) | ORAL | 1 refills | Status: DC | PRN
  Filled 2020-11-27: qty 30, 10d supply, fill #0
  Filled 2020-12-31: qty 30, 10d supply, fill #1

## 2020-11-27 MED ORDER — HYDROCODONE-ACETAMINOPHEN 5-325 MG PO TABS
1.0000 | ORAL_TABLET | Freq: Four times a day (QID) | ORAL | 0 refills | Status: DC | PRN
  Filled 2020-11-27: qty 40, 10d supply, fill #0

## 2020-11-27 MED ORDER — METHYLPREDNISOLONE 4 MG PO TBPK
ORAL_TABLET | ORAL | 0 refills | Status: DC
  Filled 2020-11-27: qty 21, 6d supply, fill #0

## 2020-11-28 ENCOUNTER — Other Ambulatory Visit (HOSPITAL_BASED_OUTPATIENT_CLINIC_OR_DEPARTMENT_OTHER): Payer: Self-pay

## 2020-11-28 ENCOUNTER — Other Ambulatory Visit: Payer: Self-pay

## 2020-11-28 ENCOUNTER — Ambulatory Visit (HOSPITAL_COMMUNITY): Payer: Medicare Other

## 2020-11-28 ENCOUNTER — Other Ambulatory Visit (HOSPITAL_COMMUNITY): Payer: Self-pay | Admitting: Student

## 2020-11-28 ENCOUNTER — Ambulatory Visit (HOSPITAL_COMMUNITY)
Admission: RE | Admit: 2020-11-28 | Discharge: 2020-11-28 | Disposition: A | Discharge status: Home / Self Care | Payer: Managed Care, Other (non HMO) | Source: Ambulatory Visit | Attending: Student | Admitting: Student

## 2020-11-28 DIAGNOSIS — M7989 Other specified soft tissue disorders: Secondary | ICD-10-CM | POA: Insufficient documentation

## 2020-11-28 DIAGNOSIS — M79605 Pain in left leg: Secondary | ICD-10-CM | POA: Diagnosis not present

## 2020-11-28 NOTE — CV Procedure (Signed)
LLE venous duplex completed. Shirlene, CNA given preliminary results.  Results can be found under chart review under CV PROC. 11/28/2020 3:43 PM Ladonya Jerkins RVT, RDMS

## 2020-11-29 ENCOUNTER — Other Ambulatory Visit (HOSPITAL_COMMUNITY): Payer: Self-pay

## 2020-11-29 MED FILL — Glipizide Tab 5 MG: ORAL | 90 days supply | Qty: 90 | Fill #0 | Status: AC

## 2020-12-05 ENCOUNTER — Other Ambulatory Visit (HOSPITAL_COMMUNITY): Payer: Self-pay

## 2020-12-05 MED FILL — Dulaglutide Soln Auto-injector 1.5 MG/0.5ML: SUBCUTANEOUS | 28 days supply | Qty: 2 | Fill #0 | Status: AC

## 2020-12-06 ENCOUNTER — Other Ambulatory Visit (HOSPITAL_COMMUNITY): Payer: Self-pay

## 2020-12-08 ENCOUNTER — Other Ambulatory Visit (HOSPITAL_COMMUNITY): Payer: Self-pay

## 2020-12-11 ENCOUNTER — Other Ambulatory Visit (HOSPITAL_COMMUNITY): Payer: Self-pay

## 2020-12-11 ENCOUNTER — Ambulatory Visit
Admission: RE | Admit: 2020-12-11 | Discharge: 2020-12-11 | Disposition: A | Discharge status: Home / Self Care | Payer: Managed Care, Other (non HMO) | Source: Ambulatory Visit | Attending: Surgery | Admitting: Surgery

## 2020-12-11 DIAGNOSIS — K8010 Calculus of gallbladder with chronic cholecystitis without obstruction: Secondary | ICD-10-CM

## 2020-12-11 MED ORDER — BASAGLAR KWIKPEN 100 UNIT/ML ~~LOC~~ SOPN
15.0000 [IU] | PEN_INJECTOR | SUBCUTANEOUS | 3 refills | Status: DC
  Filled 2020-12-11: qty 12, 80d supply, fill #0
  Filled 2021-03-05: qty 12, 80d supply, fill #1
  Filled 2021-06-18 – 2021-08-20 (×2): qty 12, 80d supply, fill #2

## 2020-12-11 MED ORDER — IOPAMIDOL (ISOVUE-300) INJECTION 61%
100.0000 mL | Freq: Once | INTRAVENOUS | Status: AC | PRN
  Administered 2020-12-11: 100 mL via INTRAVENOUS

## 2020-12-14 ENCOUNTER — Other Ambulatory Visit (HOSPITAL_COMMUNITY): Payer: Self-pay

## 2020-12-14 MED FILL — Metformin HCl Tab 1000 MG: ORAL | 90 days supply | Qty: 180 | Fill #0 | Status: AC

## 2020-12-18 ENCOUNTER — Other Ambulatory Visit (HOSPITAL_COMMUNITY): Payer: Self-pay

## 2020-12-18 ENCOUNTER — Other Ambulatory Visit (HOSPITAL_COMMUNITY): Payer: Self-pay | Admitting: Student

## 2020-12-19 ENCOUNTER — Other Ambulatory Visit (HOSPITAL_COMMUNITY): Payer: Self-pay

## 2020-12-20 ENCOUNTER — Other Ambulatory Visit (HOSPITAL_COMMUNITY): Payer: Self-pay

## 2020-12-20 MED ORDER — HYDROCODONE-ACETAMINOPHEN 5-325 MG PO TABS
1.0000 | ORAL_TABLET | Freq: Four times a day (QID) | ORAL | 0 refills | Status: DC | PRN
  Filled 2020-12-20: qty 40, 10d supply, fill #0

## 2020-12-28 ENCOUNTER — Other Ambulatory Visit (HOSPITAL_COMMUNITY): Payer: Self-pay

## 2020-12-28 MED FILL — Gabapentin Cap 300 MG: ORAL | 30 days supply | Qty: 90 | Fill #0 | Status: AC

## 2021-01-01 ENCOUNTER — Other Ambulatory Visit (HOSPITAL_COMMUNITY): Payer: Self-pay

## 2021-01-03 ENCOUNTER — Other Ambulatory Visit: Payer: Self-pay | Admitting: Internal Medicine

## 2021-01-03 ENCOUNTER — Ambulatory Visit
Admission: RE | Admit: 2021-01-03 | Discharge: 2021-01-03 | Disposition: A | Discharge status: Home / Self Care | Payer: Managed Care, Other (non HMO) | Source: Ambulatory Visit | Attending: Internal Medicine | Admitting: Internal Medicine

## 2021-01-03 ENCOUNTER — Other Ambulatory Visit (HOSPITAL_COMMUNITY): Payer: Self-pay

## 2021-01-03 DIAGNOSIS — M25552 Pain in left hip: Secondary | ICD-10-CM

## 2021-01-03 DIAGNOSIS — M79672 Pain in left foot: Secondary | ICD-10-CM

## 2021-01-03 DIAGNOSIS — M25562 Pain in left knee: Secondary | ICD-10-CM

## 2021-01-03 MED ORDER — DULOXETINE HCL 30 MG PO CPEP
30.0000 mg | ORAL_CAPSULE | Freq: Every day | ORAL | 11 refills | Status: DC
  Filled 2021-01-03: qty 30, 30d supply, fill #0
  Filled 2021-02-08: qty 30, 30d supply, fill #1

## 2021-01-03 MED ORDER — GABAPENTIN 300 MG PO CAPS
300.0000 mg | ORAL_CAPSULE | Freq: Four times a day (QID) | ORAL | 12 refills | Status: DC
  Filled 2021-04-02: qty 120, 30d supply, fill #0
  Filled 2021-10-04: qty 120, 30d supply, fill #1

## 2021-01-18 ENCOUNTER — Other Ambulatory Visit (HOSPITAL_COMMUNITY): Payer: Self-pay

## 2021-01-18 MED FILL — Dulaglutide Soln Auto-injector 1.5 MG/0.5ML: SUBCUTANEOUS | 28 days supply | Qty: 2 | Fill #1 | Status: AC

## 2021-01-18 MED FILL — Losartan Potassium & Hydrochlorothiazide Tab 100-12.5 MG: ORAL | 30 days supply | Qty: 30 | Fill #1 | Status: AC

## 2021-01-27 ENCOUNTER — Other Ambulatory Visit: Payer: Self-pay

## 2021-01-29 ENCOUNTER — Other Ambulatory Visit (HOSPITAL_COMMUNITY): Payer: Self-pay

## 2021-01-29 MED ORDER — CYCLOBENZAPRINE HCL 10 MG PO TABS
10.0000 mg | ORAL_TABLET | Freq: Three times a day (TID) | ORAL | 0 refills | Status: DC | PRN
  Filled 2021-01-29: qty 30, 10d supply, fill #0

## 2021-01-29 MED ORDER — HYDROCODONE-ACETAMINOPHEN 5-325 MG PO TABS
1.0000 | ORAL_TABLET | Freq: Four times a day (QID) | ORAL | 0 refills | Status: DC
  Filled 2021-01-29: qty 40, 10d supply, fill #0

## 2021-01-29 MED ORDER — METOPROLOL SUCCINATE ER 25 MG PO TB24
25.0000 mg | ORAL_TABLET | Freq: Every day | ORAL | 3 refills | Status: DC
  Filled 2021-01-29: qty 90, 90d supply, fill #0

## 2021-01-30 ENCOUNTER — Other Ambulatory Visit (HOSPITAL_COMMUNITY): Payer: Self-pay

## 2021-02-01 ENCOUNTER — Telehealth: Payer: Self-pay

## 2021-02-01 ENCOUNTER — Other Ambulatory Visit: Payer: Self-pay | Admitting: Neurosurgery

## 2021-02-01 DIAGNOSIS — M48062 Spinal stenosis, lumbar region with neurogenic claudication: Secondary | ICD-10-CM

## 2021-02-01 NOTE — Telephone Encounter (Addendum)
Spoke with patient for [place] to screen his medications before getting him scheduled for a lumbar myelogram.  He stated an understanding that he will be here two hours and will need a driver. He also stated an understanding that he needs to hold Duloxetine for 48 hours before, and 24 hours after, the myelogram, as well as Aspirin for three days before.

## 2021-02-05 MED FILL — Sildenafil Citrate Tab 100 MG: ORAL | 4 days supply | Qty: 4 | Fill #0 | Status: AC

## 2021-02-06 ENCOUNTER — Other Ambulatory Visit (HOSPITAL_COMMUNITY): Payer: Self-pay

## 2021-02-08 ENCOUNTER — Other Ambulatory Visit (HOSPITAL_COMMUNITY): Payer: Self-pay

## 2021-02-09 ENCOUNTER — Other Ambulatory Visit: Payer: Self-pay

## 2021-02-09 ENCOUNTER — Ambulatory Visit
Admission: RE | Admit: 2021-02-09 | Discharge: 2021-02-09 | Disposition: A | Discharge status: Home / Self Care | Payer: Managed Care, Other (non HMO) | Source: Ambulatory Visit | Attending: Neurosurgery | Admitting: Neurosurgery

## 2021-02-09 DIAGNOSIS — M48062 Spinal stenosis, lumbar region with neurogenic claudication: Secondary | ICD-10-CM

## 2021-02-09 MED ORDER — DIAZEPAM 5 MG PO TABS
5.0000 mg | ORAL_TABLET | Freq: Once | ORAL | Status: AC
  Administered 2021-02-09: 5 mg via ORAL

## 2021-02-09 MED ORDER — MEPERIDINE HCL 50 MG/ML IJ SOLN
50.0000 mg | Freq: Once | INTRAMUSCULAR | Status: AC
  Administered 2021-02-09: 50 mg via INTRAMUSCULAR

## 2021-02-09 MED ORDER — IOPAMIDOL (ISOVUE-M 200) INJECTION 41%
20.0000 mL | Freq: Once | INTRAMUSCULAR | Status: AC
  Administered 2021-02-09: 20 mL via INTRATHECAL

## 2021-02-09 MED ORDER — ONDANSETRON HCL 4 MG/2ML IJ SOLN
4.0000 mg | Freq: Once | INTRAMUSCULAR | Status: AC
  Administered 2021-02-09: 4 mg via INTRAMUSCULAR

## 2021-02-09 NOTE — Progress Notes (Signed)
Pt reports he has been off of his Duloxetine for at least 48 hours. Pt verbalized understanding that he can resume his Duloxetine tomorrow 02/10/21 at 9:30 AM.

## 2021-02-09 NOTE — Discharge Instructions (Signed)
Myelogram Discharge Instructions  Go home and rest quietly as needed. You may resume normal activities; however, do not exert yourself strongly or do any heavy lifting today and tomorrow.   DO NOT drive today.    You may resume your normal diet and medications unless otherwise indicated. Drink lots of extra fluids today and tomorrow.   The incidence of headache, nausea, or vomiting is about 5% (one in 20 patients).  If you develop a headache, lie flat for 24 hours and drink plenty of fluids until the headache goes away.  Caffeinated beverages may be helpful. If when you get up you still have a headache when standing, go back to bed and force fluids for another 24 hours.   If you develop severe nausea and vomiting or a headache that does not go away with the flat bedrest after 48 hours, please call [phone].   Call your physician for a follow-up appointment.  The results of your myelogram will be sent directly to your physician by the following day.  If you have any questions or if complications develop after you arrive home, please call [phone].  Discharge instructions have been explained to the patient.  The patient, or the person responsible for the patient, fully understands these instructions.   Thank you for visiting our office today.    YOU MAY RESUME YOUR DULOXETINE TOMORROW 02/10/21 AT 9:30 AM OR AFTER

## 2021-02-09 NOTE — Discharge Instr - Other Orders (Signed)
1038: Pt reports cramping pain in BLE from myelogram procedure. 10/10. See MAR.

## 2021-02-14 ENCOUNTER — Other Ambulatory Visit: Payer: Self-pay | Admitting: Neurosurgery

## 2021-02-15 ENCOUNTER — Other Ambulatory Visit (HOSPITAL_COMMUNITY): Payer: Self-pay

## 2021-02-15 MED FILL — Simvastatin Tab 20 MG: ORAL | 90 days supply | Qty: 90 | Fill #0 | Status: AC

## 2021-02-15 MED FILL — Losartan Potassium & Hydrochlorothiazide Tab 100-12.5 MG: ORAL | 30 days supply | Qty: 30 | Fill #2 | Status: AC

## 2021-02-22 ENCOUNTER — Other Ambulatory Visit (HOSPITAL_COMMUNITY): Payer: Self-pay

## 2021-02-22 MED ORDER — DULOXETINE HCL 60 MG PO CPEP
60.0000 mg | ORAL_CAPSULE | Freq: Every day | ORAL | 11 refills | Status: DC
  Filled 2021-02-22: qty 30, 30d supply, fill #0
  Filled 2021-04-27: qty 30, 30d supply, fill #1
  Filled 2021-06-04: qty 30, 30d supply, fill #2

## 2021-03-05 ENCOUNTER — Other Ambulatory Visit (HOSPITAL_COMMUNITY): Payer: Self-pay

## 2021-03-05 ENCOUNTER — Other Ambulatory Visit: Payer: Self-pay

## 2021-03-05 MED ORDER — TRULICITY 1.5 MG/0.5ML ~~LOC~~ SOAJ
1.5000 mg | SUBCUTANEOUS | 12 refills | Status: DC
  Filled 2021-03-05: qty 2, 28d supply, fill #0
  Filled 2021-04-27: qty 2, 28d supply, fill #1

## 2021-03-05 MED ORDER — GLIPIZIDE 5 MG PO TABS
5.0000 mg | ORAL_TABLET | Freq: Every day | ORAL | 3 refills | Status: DC | PRN
  Filled 2021-03-05: qty 90, 90d supply, fill #0

## 2021-03-16 ENCOUNTER — Other Ambulatory Visit (HOSPITAL_COMMUNITY): Payer: Self-pay

## 2021-03-16 MED ORDER — CYCLOBENZAPRINE HCL 10 MG PO TABS
10.0000 mg | ORAL_TABLET | Freq: Three times a day (TID) | ORAL | 0 refills | Status: DC | PRN
  Filled 2021-03-16: qty 30, 10d supply, fill #0

## 2021-03-20 ENCOUNTER — Other Ambulatory Visit (HOSPITAL_COMMUNITY): Payer: Self-pay

## 2021-03-20 MED ORDER — HYDROCODONE-ACETAMINOPHEN 5-325 MG PO TABS
1.0000 | ORAL_TABLET | Freq: Four times a day (QID) | ORAL | 0 refills | Status: DC | PRN
  Filled 2021-03-20: qty 40, 10d supply, fill #0

## 2021-03-21 DIAGNOSIS — U071 COVID-19: Secondary | ICD-10-CM

## 2021-03-21 HISTORY — DX: COVID-19: U07.1

## 2021-03-22 ENCOUNTER — Other Ambulatory Visit (HOSPITAL_COMMUNITY): Payer: Self-pay

## 2021-03-22 MED ORDER — PAXLOVID 20 X 150 MG & 10 X 100MG PO TBPK
ORAL_TABLET | ORAL | 0 refills | Status: DC
  Filled 2021-03-22: qty 30, 5d supply, fill #0

## 2021-03-30 ENCOUNTER — Other Ambulatory Visit (HOSPITAL_COMMUNITY): Payer: Self-pay

## 2021-03-30 MED FILL — Losartan Potassium & Hydrochlorothiazide Tab 100-12.5 MG: ORAL | 30 days supply | Qty: 30 | Fill #3 | Status: AC

## 2021-04-02 ENCOUNTER — Other Ambulatory Visit: Payer: Self-pay | Admitting: Neurosurgery

## 2021-04-02 ENCOUNTER — Other Ambulatory Visit (HOSPITAL_COMMUNITY): Payer: Self-pay

## 2021-04-04 ENCOUNTER — Other Ambulatory Visit: Payer: Self-pay | Admitting: Neurosurgery

## 2021-04-04 NOTE — Progress Notes (Signed)
Surgical Instructions   Your procedure is scheduled on Thursday, August 25th. Report to [facility] Main Entrance "A" at 05:30 A.M., then check in with the Admitting office. Call this number if you have problems the morning of surgery: [phone]   If you have any questions prior to your surgery date call [phone]: Open Monday-Friday 8am-4pm   Remember: Do not eat after midnight the night before your surgery  You may drink clear liquids until 04:30 the morning of your surgery.   Clear liquids allowed are: Water, Non-Citrus Juices (without pulp), Carbonated Beverages, Clear Tea, Black Coffee Only, and Gatorade   Take these medicines the morning of surgery with A SIP OF WATER  DULoxetine (CYMBALTA)  gabapentin (NEURONTIN)  simvastatin (ZOCOR)  If needed: cyclobenzaprine (FLEXERIL) HYDROcodone-acetaminophen (NORCO/VICODIN)  As of today, STOP taking any Aspirin (unless otherwise instructed by your surgeon) Aleve, Naproxen, Ibuprofen, Motrin, Advil, Goody's, BC's, all herbal medications, fish oil, and all vitamins.  WHAT DO I DO ABOUT MY DIABETES MEDICATION?   8/24: Take glipiZIDE (GLUCOTROL) lunch dose if your blood sugar is more than 150. Do not take evening dose.  Take metFORMIN (GLUCOPHAGE) usual dose. 8/25 (DAY OF SURGERY): DO NOT take glipiZIDE (GLUCOTROL). DO NOT take metFORMIN (GLUCOPHAGE).  The day of surgery, do not take Trulicity (dulaglutide).    HOW TO MANAGE YOUR DIABETES BEFORE AND AFTER SURGERY  Why is it important to control my blood sugar before and after surgery? Improving blood sugar levels before and after surgery helps healing and can limit problems. A way of improving blood sugar control is eating a healthy diet by:  Eating less sugar and carbohydrates  Increasing activity/exercise  Talking with your doctor about reaching your blood sugar goals High blood sugars (greater than 180 mg/dL) can raise your risk of infections and slow your recovery, so  you will need to focus on controlling your diabetes during the weeks before surgery. Make sure that the doctor who takes care of your diabetes knows about your planned surgery including the date and location.  How do I manage my blood sugar before surgery? Check your blood sugar at least 4 times a day, starting 2 days before surgery, to make sure that the level is not too high or low.  Check your blood sugar the morning of your surgery when you wake up and every 2 hours until you get to the Short Stay unit.  If your blood sugar is less than 70 mg/dL, you will need to treat for low blood sugar: Do not take insulin. Treat a low blood sugar (less than 70 mg/dL) with  cup of clear juice (cranberry or apple), 4 glucose tablets, OR glucose gel. Recheck blood sugar in 15 minutes after treatment (to make sure it is greater than 70 mg/dL). If your blood sugar is not greater than 70 mg/dL on recheck, call [phone] for further instructions. Report your blood sugar to the short stay nurse when you get to Short Stay.  If you are admitted to the hospital after surgery: Your blood sugar will be checked by the staff and you will probably be given insulin after surgery (instead of oral diabetes medicines) to make sure you have good blood sugar levels. The goal for blood sugar control after surgery is 80-180 mg/dL.           Do not wear jewelry  Do not wear lotions, powders, colognes, or deodorant. Men may shave face and neck. Do not bring valuables to the hospital. [facility]  is not responsible for any belongings or valuables.              Do NOT Smoke (Tobacco/Vaping) or drink Alcohol 24 hours prior to your procedure If you use a CPAP at night, you may bring all equipment for your overnight stay.   Contacts, glasses, dentures or bridgework may not be worn into surgery, please bring cases for these belongings   For patients admitted to the hospital, discharge time will be determined by your  treatment team.   Patients discharged the day of surgery will not be allowed to drive home, and someone needs to stay with them for 24 hours.  ONLY 1 SUPPORT PERSON MAY BE PRESENT WHILE YOU ARE IN SURGERY. IF YOU ARE TO BE ADMITTED ONCE YOU ARE IN YOUR ROOM YOU WILL BE ALLOWED TWO (2) VISITORS.  Minor children may have two parents present. Special consideration for safety and communication needs will be reviewed on a case by case basis.  Special instructions:    Oral Hygiene is also important to reduce your risk of infection.  Remember - BRUSH YOUR TEETH THE MORNING OF SURGERY WITH YOUR REGULAR TOOTHPASTE   [place]- Preparing For Surgery  Before surgery, you can play an important role. Because skin is not sterile, your skin needs to be as free of germs as possible. You can reduce the number of germs on your skin by washing with CHG (chlorahexidine gluconate) Soap before surgery.  CHG is an antiseptic cleaner which kills germs and bonds with the skin to continue killing germs even after washing.     Please do not use if you have an allergy to CHG or antibacterial soaps. If your skin becomes reddened/irritated stop using the CHG.  Do not shave (including legs and underarms) for at least 48 hours prior to first CHG shower. It is OK to shave your face.  Please follow these instructions carefully.     Shower the NIGHT BEFORE SURGERY and the MORNING OF SURGERY with CHG Soap.   If you chose to wash your hair, wash your hair first as usual with your normal shampoo. After you shampoo, rinse your hair and body thoroughly to remove the shampoo.  Then [employer] and genitals (private parts) with your normal soap and rinse thoroughly to remove soap.  After that Use CHG Soap as you would any other liquid soap. You can apply CHG directly to the skin and wash gently with a scrungie or a clean washcloth.   Apply the CHG Soap to your body ONLY FROM THE NECK DOWN.  Do not use on open wounds or open sores.  Avoid contact with your eyes, ears, mouth and genitals (private parts). Wash Face and genitals (private parts)  with your normal soap.   Wash thoroughly, paying special attention to the area where your surgery will be performed.  Thoroughly rinse your body with warm water from the neck down.  DO NOT shower/wash with your normal soap after using and rinsing off the CHG Soap.  Pat yourself dry with a CLEAN TOWEL.  Wear CLEAN PAJAMAS to bed the night before surgery  Place CLEAN SHEETS on your bed the night before your surgery  DO NOT SLEEP WITH PETS.   Day of Surgery:  Take a shower with CHG soap. Wear Clean/Comfortable clothing the morning of surgery Do not apply any deodorants/lotions.   Remember to brush your teeth WITH YOUR REGULAR TOOTHPASTE.   Please read over the following fact sheets that you  were given.

## 2021-04-05 ENCOUNTER — Inpatient Hospital Stay: Admit: 2021-04-05 | Payer: Medicare Other | Admitting: Neurosurgery

## 2021-04-05 ENCOUNTER — Other Ambulatory Visit: Payer: Self-pay

## 2021-04-05 ENCOUNTER — Encounter (HOSPITAL_COMMUNITY)
Admission: RE | Admit: 2021-04-05 | Discharge: 2021-04-05 | Disposition: A | Discharge status: Home / Self Care | Payer: Managed Care, Other (non HMO) | Source: Ambulatory Visit | Attending: Neurosurgery | Admitting: Neurosurgery

## 2021-04-05 ENCOUNTER — Encounter (HOSPITAL_COMMUNITY): Payer: Self-pay

## 2021-04-05 DIAGNOSIS — Z01818 Encounter for other preprocedural examination: Secondary | ICD-10-CM | POA: Diagnosis not present

## 2021-04-05 LAB — SURGICAL PCR SCREEN
MRSA, PCR: NEGATIVE
Staphylococcus aureus: POSITIVE — AB

## 2021-04-05 LAB — CBC
HCT: 37.6 % — ABNORMAL LOW (ref 39.0–52.0)
Hemoglobin: 12.4 g/dL — ABNORMAL LOW (ref 13.0–17.0)
MCH: 31.2 pg (ref 26.0–34.0)
MCHC: 33 g/dL (ref 30.0–36.0)
MCV: 94.5 fL (ref 80.0–100.0)
Platelets: 314 10*3/uL (ref 150–400)
RBC: 3.98 MIL/uL — ABNORMAL LOW (ref 4.22–5.81)
RDW: 11.3 % — ABNORMAL LOW (ref 11.5–15.5)
WBC: 7.4 10*3/uL (ref 4.0–10.5)
nRBC: 0 % (ref 0.0–0.2)

## 2021-04-05 LAB — HEMOGLOBIN A1C
Hgb A1c MFr Bld: 7.8 % — ABNORMAL HIGH (ref 4.8–5.6)
Mean Plasma Glucose: 177.16 mg/dL

## 2021-04-05 LAB — TYPE AND SCREEN
ABO/RH(D): A POS
Antibody Screen: NEGATIVE

## 2021-04-05 LAB — BASIC METABOLIC PANEL
Anion gap: 8 (ref 5–15)
BUN: 17 mg/dL (ref 8–23)
CO2: 26 mmol/L (ref 22–32)
Calcium: 9.7 mg/dL (ref 8.9–10.3)
Chloride: 105 mmol/L (ref 98–111)
Creatinine, Ser: 0.76 mg/dL (ref 0.61–1.24)
GFR, Estimated: >60 mL/min (ref >60)
Glucose, Bld: 149 mg/dL — ABNORMAL HIGH (ref 70–99)
Potassium: 4 mmol/L (ref 3.5–5.1)
Sodium: 139 mmol/L (ref 135–145)

## 2021-04-05 LAB — GLUCOSE, CAPILLARY: Glucose-Capillary: 217 mg/dL — ABNORMAL HIGH (ref 70–99)

## 2021-04-05 SURGERY — POSTERIOR LUMBAR FUSION 1 LEVEL
Anesthesia: General

## 2021-04-05 NOTE — Progress Notes (Addendum)
PCP - Dr. Tommie Ard  Cardiologist -saw Dr. Claiborne Billings in 2017, note, "if you see Dr. Delfina Redwood every 3 months, then you can see me every year."  Mr. Alioto sees        Dr. Lysle Rubens noiw, I have requested records.   Endocrine- no  Chest x-ray -  na  EKG - 05/15/20  Stress Test - 04/10/15  ECHO -   Cardiac Cath -   AICD-no PM-no LOOP-no  Dialysis-no  Sleep Study - no CPAP - no  LABS-CBC, BMP, T/S. A1C, PCR Covid Test , Monday AM at [facility] No  instructions, patient is to call office  ERAS-no  HA1C- Fasting Blood Sugar -  Checks Blood Sugar _2____ times a week , maybe  Anesthesia-  Pt denies having chest pain, sob, or fever at this time. All instructions explained to the pt, with a verbal understanding of the material. Pt agrees to go over the instructions while at home for a better understanding. Pt also instructed to self quarantine after being tested for COVID-19. The opportunity to ask questions was provided.

## 2021-04-05 NOTE — Progress Notes (Signed)
Surgical Instructions   Your procedure is scheduled on Thursday, August 25th. Report to [facility] Main Entrance "A" at 05:30 A.M., then check in with the Admitting office. Call this number if you have problems the morning of surgery: [phone]   If you have any questions prior to your surgery date call [phone]: Open Monday-Friday 8am-4pm   Remember: Do not eat or Drink after midnight the night before surgery   Take these medicines the morning of surgery with A SIP OF WATER  DULoxetine (CYMBALTA)  gabapentin (NEURONTIN)  simvastatin (ZOCOR)  If needed: cyclobenzaprine (FLEXERIL) HYDROcodone-acetaminophen (NORCO/VICODIN)  As of today, STOP taking any Aspirin (unless otherwise instructed by your surgeon) Aleve, Naproxen, Ibuprofen, Motrin, Advil, Goody's, BC's, all herbal medications, fish oil, and all vitamins.  WHAT DO I DO ABOUT MY DIABETES MEDICATION?   8/24: Take glipiZIDE (GLUCOTROL) lunch dose if your blood sugar is more than 150. Do not take evening dose.  Take metFORMIN (GLUCOPHAGE) usual dose. 8/25 (DAY OF SURGERY): DO NOT take glipiZIDE (GLUCOTROL). DO NOT take metFORMIN (GLUCOPHAGE).  The day of surgery, do not take Trulicity (dulaglutide).    HOW TO MANAGE YOUR DIABETES BEFORE AND AFTER SURGERY  Why is it important to control my blood sugar before and after surgery? Improving blood sugar levels before and after surgery helps healing and can limit problems. A way of improving blood sugar control is eating a healthy diet by:  Eating less sugar and carbohydrates  Increasing activity/exercise  Talking with your doctor about reaching your blood sugar goals High blood sugars (greater than 180 mg/dL) can raise your risk of infections and slow your recovery, so you will need to focus on controlling your diabetes during the weeks before surgery. Make sure that the doctor who takes care of your diabetes knows about your planned surgery including the date and  location.  How do I manage my blood sugar before surgery? Check your blood sugar at least 4 times a day, starting 2 days before surgery, to make sure that the level is not too high or low.  Check your blood sugar the morning of your surgery when you wake up and every 2 hours until you get to the Short Stay unit.  If your blood sugar is less than 70 mg/dL, you will need to treat for low blood sugar: Do not take insulin. Treat a low blood sugar (less than 70 mg/dL) with  cup of clear juice (cranberry or apple), 4 glucose tablets, OR glucose gel. Recheck blood sugar in 15 minutes after treatment (to make sure it is greater than 70 mg/dL). If your blood sugar is not greater than 70 mg/dL on recheck, call [phone] for further instructions. Report your blood sugar to the short stay nurse when you get to Short Stay.  If you are admitted to the hospital after surgery: Your blood sugar will be checked by the staff and you will probably be given insulin after surgery (instead of oral diabetes medicines) to make sure you have good blood sugar levels. The goal for blood sugar control after surgery is 80-180 mg/dL.        TH Morning of surgery  Do not wear jewelry  Do not wear lotions, powders, colognes, or deodorant. Men may shave face and neck. Do not bring valuables to the hospital. [facility] is not responsible for any belongings or valuables. Contacts, glasses, dentures or bridgework may not be worn into surgery, please bring cases for these belongings   For patients admitted  to the hospital, discharge time will be determined by your treatment team.   Patients discharged the day of surgery will not be allowed to drive home, and someone needs to stay with them for 24 hours.  ONLY 1 SUPPORT PERSON MAY BE PRESENT WHILE YOU ARE IN SURGERY. IF YOU ARE TO BE ADMITTED ONCE YOU ARE IN YOUR ROOM YOU WILL BE ALLOWED TWO (2) VISITORS.  Minor children may have two parents present. Special consideration  for safety and communication needs will be reviewed on a case by case basis.  Special instructions:    Oral Hygiene is also important to reduce your risk of infection.  Remember - BRUSH YOUR TEETH THE MORNING OF SURGERY WITH YOUR REGULAR TOOTHPASTE   [place]- Preparing For Surgery  Before surgery, you can play an important role. Because skin is not sterile, your skin needs to be as free of germs as possible. You can reduce the number of germs on your skin by washing with CHG (chlorahexidine gluconate) Soap before surgery.  CHG is an antiseptic cleaner which kills germs and bonds with the skin to continue killing germs even after washing.     Please do not use if you have an allergy to CHG or antibacterial soaps. If your skin becomes reddened/irritated stop using the CHG.  Do not shave (including legs and underarms) for at least 48 hours prior to first CHG shower. It is OK to shave your face.  Please follow these instructions carefully.     Shower the NIGHT BEFORE SURGERY and the MORNING OF SURGERY with CHG Soap.   If you chose to wash your hair, wash your hair first as usual with your normal shampoo. After you shampoo, rinse your hair and body thoroughly to remove the shampoo.  Then [employer] and genitals (private parts) with your normal soap and rinse thoroughly to remove soap.  After that Use CHG Soap as you would any other liquid soap. You can apply CHG directly to the skin and wash gently with a scrungie or a clean washcloth.   Apply the CHG Soap to your body ONLY FROM THE NECK DOWN.  Do not use on open wounds or open sores. Avoid contact with your eyes, ears, mouth and genitals (private parts). Wash Face and genitals (private parts)  with your normal soap.   Wash thoroughly, paying special attention to the area where your surgery will be performed.  Thoroughly rinse your body with warm water from the neck down.  DO NOT shower/wash with your normal soap after using and rinsing  off the CHG Soap.  Pat yourself dry with a CLEAN TOWEL.  Wear CLEAN PAJAMAS to bed the night before surgery  Place CLEAN SHEETS on your bed the night before your surgery  DO NOT SLEEP WITH PETS.   Day of Surgery: Shower as Greg Cutter  Take a shower with CHG soap. Wear Clean/Comfortable clothing the morning of surgery Do not apply any deodorants/lotions.   Remember to brush your teeth WITH YOUR REGULAR TOOTHPASTE.   Please read over the fact sheets that you were given.

## 2021-04-06 ENCOUNTER — Encounter (HOSPITAL_COMMUNITY): Payer: Self-pay
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
## 2021-04-06 NOTE — Anesthesia Preprocedure Evaluation (Addendum)
Anesthesia Evaluation  Patient identified by MRN, date of birth, ID band Patient awake    Reviewed: Allergy & Precautions, NPO status , Patient's Chart, lab work & pertinent test results  History of Anesthesia Complications Negative for: history of anesthetic complications  Airway Mallampati: II  TM Distance: >3 FB Neck ROM: Full    Dental  (+) Dental Advisory Given, Edentulous Upper, Missing   Pulmonary Recent URI  (COVID+ 03/21/21), former smoker,    Pulmonary exam normal        Cardiovascular hypertension, Pt. on medications Normal cardiovascular exam  Echo 05/15/20: EF 60-65%, g1dd, normal RV function, normal MV, normal AV, bubble study negative  Stress test 2016: Low risk stress nuclear study with significant ST depression with exercise; small, moderate intensity, fixed inferior defect consistent with thinning; no ischemia; EF 46 with mild global hypokinesis and mild LVE.   Neuro/Psych negative neurological ROS     GI/Hepatic hiatal hernia, (+)     substance abuse (remote)  IV drug use, Hepatitis - (treated 2017), C  Endo/Other  diabetes, Type 2, Oral Hypoglycemic Agents, Insulin Dependent  Renal/GU negative Renal ROS  negative genitourinary   Musculoskeletal negative musculoskeletal ROS (+)   Abdominal   Peds  Hematology  (+) anemia ,   Anesthesia Other Findings   Reproductive/Obstetrics                            Anesthesia Physical Anesthesia Plan  ASA: 3  Anesthesia Plan: General   Post-op Pain Management:    Induction: Intravenous  PONV Risk Score and Plan: 2 and Ondansetron, Dexamethasone, Treatment may vary due to age or medical condition and Midazolam  Airway Management Planned: Oral ETT  Additional Equipment: None  Intra-op Plan:   Post-operative Plan: Extubation in OR  Informed Consent: I have reviewed the patients History and Physical, chart, labs and  discussed the procedure including the risks, benefits and alternatives for the proposed anesthesia with the patient or authorized representative who has indicated his/her understanding and acceptance.     Dental advisory given  Plan Discussed with:   Anesthesia Plan Comments: (PAT note written 04/06/2021 by Myra Gianotti, PA-C. + COVID-19 03/21/21. )       Anesthesia Quick Evaluation

## 2021-04-09 ENCOUNTER — Other Ambulatory Visit: Payer: Self-pay | Admitting: Neurosurgery

## 2021-04-09 LAB — SARS CORONAVIRUS 2 (TAT 6-24 HRS): SARS Coronavirus 2: POSITIVE — AB

## 2021-04-12 ENCOUNTER — Ambulatory Visit (HOSPITAL_COMMUNITY): Payer: Managed Care, Other (non HMO)

## 2021-04-12 ENCOUNTER — Encounter (HOSPITAL_COMMUNITY)
Admission: RE | Disposition: A | Discharge status: Home / Self Care | Payer: Self-pay | Source: Home / Self Care | Attending: Neurosurgery

## 2021-04-12 ENCOUNTER — Encounter (HOSPITAL_COMMUNITY): Payer: Self-pay | Admitting: Neurosurgery

## 2021-04-12 ENCOUNTER — Ambulatory Visit (HOSPITAL_COMMUNITY): Payer: Managed Care, Other (non HMO) | Admitting: Certified Registered"

## 2021-04-12 ENCOUNTER — Ambulatory Visit (HOSPITAL_COMMUNITY): Payer: Managed Care, Other (non HMO) | Admitting: Vascular Surgery

## 2021-04-12 ENCOUNTER — Ambulatory Visit (HOSPITAL_COMMUNITY)
Admission: RE | Admit: 2021-04-12 | Discharge: 2021-04-13 | Disposition: A | Discharge status: Home / Self Care | Payer: Managed Care, Other (non HMO) | Attending: Neurosurgery | Admitting: Neurosurgery

## 2021-04-12 DIAGNOSIS — Z7984 Long term (current) use of oral hypoglycemic drugs: Secondary | ICD-10-CM | POA: Diagnosis not present

## 2021-04-12 DIAGNOSIS — Z87891 Personal history of nicotine dependence: Secondary | ICD-10-CM | POA: Diagnosis not present

## 2021-04-12 DIAGNOSIS — M4316 Spondylolisthesis, lumbar region: Secondary | ICD-10-CM

## 2021-04-12 DIAGNOSIS — Z8616 Personal history of COVID-19: Secondary | ICD-10-CM | POA: Insufficient documentation

## 2021-04-12 DIAGNOSIS — Z7982 Long term (current) use of aspirin: Secondary | ICD-10-CM | POA: Insufficient documentation

## 2021-04-12 DIAGNOSIS — Z794 Long term (current) use of insulin: Secondary | ICD-10-CM | POA: Insufficient documentation

## 2021-04-12 DIAGNOSIS — E119 Type 2 diabetes mellitus without complications: Secondary | ICD-10-CM | POA: Diagnosis not present

## 2021-04-12 DIAGNOSIS — Z981 Arthrodesis status: Secondary | ICD-10-CM | POA: Diagnosis not present

## 2021-04-12 DIAGNOSIS — M5136 Other intervertebral disc degeneration, lumbar region: Secondary | ICD-10-CM | POA: Diagnosis present

## 2021-04-12 DIAGNOSIS — M5116 Intervertebral disc disorders with radiculopathy, lumbar region: Secondary | ICD-10-CM | POA: Diagnosis not present

## 2021-04-12 DIAGNOSIS — Z79899 Other long term (current) drug therapy: Secondary | ICD-10-CM | POA: Insufficient documentation

## 2021-04-12 DIAGNOSIS — Z419 Encounter for procedure for purposes other than remedying health state, unspecified: Secondary | ICD-10-CM

## 2021-04-12 DIAGNOSIS — M48061 Spinal stenosis, lumbar region without neurogenic claudication: Secondary | ICD-10-CM | POA: Insufficient documentation

## 2021-04-12 DIAGNOSIS — Z452 Encounter for adjustment and management of vascular access device: Secondary | ICD-10-CM

## 2021-04-12 DIAGNOSIS — M51369 Other intervertebral disc degeneration, lumbar region without mention of lumbar back pain or lower extremity pain: Secondary | ICD-10-CM

## 2021-04-12 LAB — GLUCOSE, CAPILLARY
Glucose-Capillary: 339 mg/dL — ABNORMAL HIGH (ref 70–99)
Glucose-Capillary: 357 mg/dL — ABNORMAL HIGH (ref 70–99)
Glucose-Capillary: 332 mg/dL — ABNORMAL HIGH (ref 70–99)
Glucose-Capillary: 172 mg/dL — ABNORMAL HIGH (ref 70–99)
Glucose-Capillary: 159 mg/dL — ABNORMAL HIGH (ref 70–99)
Glucose-Capillary: 162 mg/dL — ABNORMAL HIGH (ref 70–99)
Glucose-Capillary: 214 mg/dL — ABNORMAL HIGH (ref 70–99)
Glucose-Capillary: 229 mg/dL — ABNORMAL HIGH (ref 70–99)

## 2021-04-12 SURGERY — POSTERIOR LUMBAR FUSION 1 LEVEL
Anesthesia: General

## 2021-04-12 MED ORDER — INSULIN ASPART 100 UNIT/ML IJ SOLN
10.0000 [IU] | INTRAMUSCULAR | Status: AC
  Filled 2021-04-12: qty 0.1

## 2021-04-12 MED ORDER — BACITRACIN ZINC 500 UNIT/GM EX OINT
TOPICAL_OINTMENT | CUTANEOUS | Status: AC
  Filled 2021-04-12: qty 28.35

## 2021-04-12 MED ORDER — CHLORHEXIDINE GLUCONATE CLOTH 2 % EX PADS: 6.0000 | MEDICATED_PAD | Freq: Once | CUTANEOUS | Status: DC

## 2021-04-12 MED ORDER — CEFAZOLIN SODIUM-DEXTROSE 2-4 GM/100ML-% IV SOLN
INTRAVENOUS | Status: AC
  Filled 2021-04-12: qty 100

## 2021-04-12 MED ORDER — SIMVASTATIN 20 MG PO TABS
20.0000 mg | ORAL_TABLET | Freq: Every day | ORAL | Status: DC
  Administered 2021-04-13: 20 mg via ORAL
  Filled 2021-04-12: qty 1

## 2021-04-12 MED ORDER — SUGAMMADEX SODIUM 200 MG/2ML IV SOLN
INTRAVENOUS | Status: DC | PRN
  Administered 2021-04-12: 200 mg via INTRAVENOUS

## 2021-04-12 MED ORDER — EPHEDRINE SULFATE-NACL 50-0.9 MG/10ML-% IV SOSY
PREFILLED_SYRINGE | INTRAVENOUS | Status: DC | PRN
  Administered 2021-04-12 (×4): 5 mg via INTRAVENOUS

## 2021-04-12 MED ORDER — ONDANSETRON HCL 4 MG/2ML IJ SOLN: 4.0000 mg | Freq: Four times a day (QID) | INTRAMUSCULAR | Status: DC | PRN

## 2021-04-12 MED ORDER — THROMBIN 5000 UNITS EX SOLR
OROMUCOSAL | Status: DC | PRN
  Administered 2021-04-12: 5 mL via TOPICAL

## 2021-04-12 MED ORDER — CYCLOBENZAPRINE HCL 10 MG PO TABS
10.0000 mg | ORAL_TABLET | Freq: Three times a day (TID) | ORAL | Status: DC | PRN
  Administered 2021-04-12: 10 mg via ORAL
  Filled 2021-04-12: qty 1

## 2021-04-12 MED ORDER — BUPIVACAINE-EPINEPHRINE 0.5% -1:200000 IJ SOLN
INTRAMUSCULAR | Status: AC
  Filled 2021-04-12: qty 1

## 2021-04-12 MED ORDER — HYDROCHLOROTHIAZIDE 12.5 MG PO CAPS
12.5000 mg | ORAL_CAPSULE | Freq: Every day | ORAL | Status: DC
  Administered 2021-04-13: 12.5 mg via ORAL
  Filled 2021-04-12: qty 1

## 2021-04-12 MED ORDER — AMISULPRIDE (ANTIEMETIC) 5 MG/2ML IV SOLN: 10.0000 mg | Freq: Once | INTRAVENOUS | Status: DC | PRN

## 2021-04-12 MED ORDER — ONDANSETRON HCL 4 MG/2ML IJ SOLN: 4.0000 mg | Freq: Once | INTRAMUSCULAR | Status: DC | PRN

## 2021-04-12 MED ORDER — GABAPENTIN 300 MG PO CAPS
300.0000 mg | ORAL_CAPSULE | Freq: Four times a day (QID) | ORAL | Status: DC
  Administered 2021-04-12 – 2021-04-13 (×3): 300 mg via ORAL
  Filled 2021-04-12 (×3): qty 1

## 2021-04-12 MED ORDER — HEMOSTATIC AGENTS (NO CHARGE) OPTIME
TOPICAL | Status: DC | PRN
  Administered 2021-04-12: 1 via TOPICAL

## 2021-04-12 MED ORDER — ROCURONIUM BROMIDE 10 MG/ML (PF) SYRINGE
PREFILLED_SYRINGE | INTRAVENOUS | Status: DC | PRN
  Administered 2021-04-12 (×2): 50 mg via INTRAVENOUS
  Administered 2021-04-12: 10 mg via INTRAVENOUS

## 2021-04-12 MED ORDER — OXYCODONE HCL 5 MG PO TABS
10.0000 mg | ORAL_TABLET | ORAL | Status: DC | PRN
  Administered 2021-04-12 – 2021-04-13 (×5): 10 mg via ORAL
  Filled 2021-04-12 (×5): qty 2

## 2021-04-12 MED ORDER — SODIUM CHLORIDE 0.9% FLUSH: 3.0000 mL | INTRAVENOUS | Status: DC | PRN

## 2021-04-12 MED ORDER — FERROUS SULFATE 325 (65 FE) MG PO TABS
325.0000 mg | ORAL_TABLET | Freq: Every day | ORAL | Status: DC
  Filled 2021-04-12: qty 1

## 2021-04-12 MED ORDER — MENTHOL 3 MG MT LOZG: 1.0000 | LOZENGE | OROMUCOSAL | Status: DC | PRN

## 2021-04-12 MED ORDER — METFORMIN HCL 500 MG PO TABS
500.0000 mg | ORAL_TABLET | Freq: Two times a day (BID) | ORAL | Status: DC
  Administered 2021-04-13: 500 mg via ORAL
  Filled 2021-04-12: qty 1

## 2021-04-12 MED ORDER — MORPHINE SULFATE (PF) 4 MG/ML IV SOLN: 4.0000 mg | INTRAVENOUS | Status: DC | PRN

## 2021-04-12 MED ORDER — HYDROMORPHONE HCL 1 MG/ML IJ SOLN
INTRAMUSCULAR | Status: AC
  Filled 2021-04-12: qty 0.5

## 2021-04-12 MED ORDER — OXYCODONE HCL 5 MG PO TABS
5.0000 mg | ORAL_TABLET | Freq: Once | ORAL | Status: AC | PRN
  Administered 2021-04-12: 5 mg via ORAL

## 2021-04-12 MED ORDER — CHLORHEXIDINE GLUCONATE 0.12 % MT SOLN
OROMUCOSAL | Status: AC
  Administered 2021-04-12: 15 mL via OROMUCOSAL
  Filled 2021-04-12: qty 15

## 2021-04-12 MED ORDER — ACETAMINOPHEN 325 MG PO TABS
650.0000 mg | ORAL_TABLET | ORAL | Status: DC | PRN
  Administered 2021-04-12 – 2021-04-13 (×2): 650 mg via ORAL
  Filled 2021-04-12 (×2): qty 2

## 2021-04-12 MED ORDER — 0.9 % SODIUM CHLORIDE (POUR BTL) OPTIME
TOPICAL | Status: DC | PRN
  Administered 2021-04-12: 1000 mL

## 2021-04-12 MED ORDER — OXYCODONE HCL 5 MG PO TABS
ORAL_TABLET | ORAL | Status: AC
  Filled 2021-04-12: qty 1

## 2021-04-12 MED ORDER — CEFAZOLIN SODIUM-DEXTROSE 2-4 GM/100ML-% IV SOLN
2.0000 g | Freq: Three times a day (TID) | INTRAVENOUS | Status: AC
  Administered 2021-04-12 – 2021-04-13 (×2): 2 g via INTRAVENOUS
  Filled 2021-04-12 (×2): qty 100

## 2021-04-12 MED ORDER — DULOXETINE HCL 30 MG PO CPEP
30.0000 mg | ORAL_CAPSULE | Freq: Every day | ORAL | Status: DC
  Administered 2021-04-13: 30 mg via ORAL
  Filled 2021-04-12: qty 1

## 2021-04-12 MED ORDER — LOSARTAN POTASSIUM-HCTZ 100-12.5 MG PO TABS: 1.0000 | ORAL_TABLET | Freq: Every day | ORAL | Status: DC

## 2021-04-12 MED ORDER — OXYCODONE HCL 5 MG/5ML PO SOLN: 5.0000 mg | Freq: Once | ORAL | Status: AC | PRN

## 2021-04-12 MED ORDER — GLIPIZIDE 5 MG PO TABS
2.5000 mg | ORAL_TABLET | Freq: Every day | ORAL | Status: DC
  Administered 2021-04-13: 2.5 mg via ORAL
  Filled 2021-04-12: qty 1

## 2021-04-12 MED ORDER — ONDANSETRON HCL 4 MG PO TABS: 4.0000 mg | ORAL_TABLET | Freq: Four times a day (QID) | ORAL | Status: DC | PRN

## 2021-04-12 MED ORDER — BUPIVACAINE LIPOSOME 1.3 % IJ SUSP
INTRAMUSCULAR | Status: AC
  Filled 2021-04-12: qty 20

## 2021-04-12 MED ORDER — SODIUM CHLORIDE 0.9% FLUSH
3.0000 mL | Freq: Two times a day (BID) | INTRAVENOUS | Status: DC
  Administered 2021-04-13: 3 mL via INTRAVENOUS

## 2021-04-12 MED ORDER — HYDROMORPHONE HCL 1 MG/ML IJ SOLN: 0.2500 mg | INTRAMUSCULAR | Status: DC | PRN

## 2021-04-12 MED ORDER — THROMBIN 5000 UNITS EX SOLR
CUTANEOUS | Status: AC
  Filled 2021-04-12: qty 5000

## 2021-04-12 MED ORDER — LACTATED RINGERS IV SOLN: INTRAVENOUS | Status: DC

## 2021-04-12 MED ORDER — ORAL CARE MOUTH RINSE: 15.0000 mL | Freq: Once | OROMUCOSAL | Status: AC

## 2021-04-12 MED ORDER — PROPOFOL 10 MG/ML IV BOLUS
INTRAVENOUS | Status: AC
  Filled 2021-04-12: qty 20

## 2021-04-12 MED ORDER — INSULIN ASPART 100 UNIT/ML IJ SOLN
INTRAMUSCULAR | Status: AC
  Administered 2021-04-12: 10 [IU] via SUBCUTANEOUS
  Filled 2021-04-12: qty 1

## 2021-04-12 MED ORDER — PHENOL 1.4 % MT LIQD: 1.0000 | OROMUCOSAL | Status: DC | PRN

## 2021-04-12 MED ORDER — PHENYLEPHRINE HCL-NACL 20-0.9 MG/250ML-% IV SOLN
INTRAVENOUS | Status: DC | PRN
  Administered 2021-04-12: 50 ug/min via INTRAVENOUS

## 2021-04-12 MED ORDER — CHLORHEXIDINE GLUCONATE 0.12 % MT SOLN: 15.0000 mL | Freq: Once | OROMUCOSAL | Status: AC

## 2021-04-12 MED ORDER — BACITRACIN ZINC 500 UNIT/GM EX OINT
TOPICAL_OINTMENT | CUTANEOUS | Status: DC | PRN
  Administered 2021-04-12: 1 via TOPICAL

## 2021-04-12 MED ORDER — DEXMEDETOMIDINE (PRECEDEX) IN NS 20 MCG/5ML (4 MCG/ML) IV SYRINGE
PREFILLED_SYRINGE | INTRAVENOUS | Status: DC | PRN
  Administered 2021-04-12: 20 ug via INTRAVENOUS

## 2021-04-12 MED ORDER — ACETAMINOPHEN 500 MG PO TABS
1000.0000 mg | ORAL_TABLET | Freq: Four times a day (QID) | ORAL | Status: DC
Start: 2021-04-12 — End: 2021-04-13
  Administered 2021-04-12: 1000 mg via ORAL
  Filled 2021-04-12 (×2): qty 2

## 2021-04-12 MED ORDER — HYDROMORPHONE HCL 1 MG/ML IJ SOLN
INTRAMUSCULAR | Status: DC | PRN
  Administered 2021-04-12: .5 mg via INTRAVENOUS

## 2021-04-12 MED ORDER — PROPOFOL 10 MG/ML IV BOLUS
INTRAVENOUS | Status: DC | PRN
  Administered 2021-04-12: 150 mg via INTRAVENOUS

## 2021-04-12 MED ORDER — CEFAZOLIN SODIUM-DEXTROSE 2-4 GM/100ML-% IV SOLN
2.0000 g | INTRAVENOUS | Status: AC
Start: 2021-04-12 — End: 2021-04-12
  Administered 2021-04-12: 2 g via INTRAVENOUS

## 2021-04-12 MED ORDER — INSULIN ASPART 100 UNIT/ML IJ SOLN
INTRAMUSCULAR | Status: DC | PRN
  Administered 2021-04-12: 2 [IU] via SUBCUTANEOUS

## 2021-04-12 MED ORDER — FENTANYL CITRATE (PF) 250 MCG/5ML IJ SOLN
INTRAMUSCULAR | Status: AC
  Filled 2021-04-12: qty 5

## 2021-04-12 MED ORDER — HYDROCODONE-ACETAMINOPHEN 5-325 MG PO TABS: 1.0000 | ORAL_TABLET | ORAL | Status: DC | PRN

## 2021-04-12 MED ORDER — OXYCODONE HCL 5 MG PO TABS
5.0000 mg | ORAL_TABLET | ORAL | Status: DC | PRN
Start: 2021-04-12 — End: 2021-04-13

## 2021-04-12 MED ORDER — LOSARTAN POTASSIUM 50 MG PO TABS
100.0000 mg | ORAL_TABLET | Freq: Every day | ORAL | Status: DC
  Administered 2021-04-13: 100 mg via ORAL
  Filled 2021-04-12: qty 2

## 2021-04-12 MED ORDER — BUPIVACAINE LIPOSOME 1.3 % IJ SUSP
INTRAMUSCULAR | Status: DC | PRN
  Administered 2021-04-12: 20 mL

## 2021-04-12 MED ORDER — DOCUSATE SODIUM 100 MG PO CAPS
100.0000 mg | ORAL_CAPSULE | Freq: Two times a day (BID) | ORAL | Status: DC
  Administered 2021-04-12 – 2021-04-13 (×2): 100 mg via ORAL
  Filled 2021-04-12 (×2): qty 1

## 2021-04-12 MED ORDER — ACETAMINOPHEN 650 MG RE SUPP: 650.0000 mg | RECTAL | Status: DC | PRN

## 2021-04-12 MED ORDER — SODIUM CHLORIDE 0.9 % IV SOLN: 250.0000 mL | INTRAVENOUS | Status: DC

## 2021-04-12 MED ORDER — ONDANSETRON HCL 4 MG/2ML IJ SOLN
INTRAMUSCULAR | Status: DC | PRN
  Administered 2021-04-12: 4 mg via INTRAVENOUS

## 2021-04-12 MED ORDER — BUPIVACAINE-EPINEPHRINE (PF) 0.5% -1:200000 IJ SOLN
INTRAMUSCULAR | Status: DC | PRN
  Administered 2021-04-12: 10 mL

## 2021-04-12 MED ORDER — DULOXETINE HCL 30 MG PO CPEP
60.0000 mg | ORAL_CAPSULE | Freq: Every day | ORAL | Status: DC
  Administered 2021-04-13: 60 mg via ORAL
  Filled 2021-04-12: qty 2

## 2021-04-12 MED ORDER — BISACODYL 10 MG RE SUPP: 10.0000 mg | Freq: Every day | RECTAL | Status: DC | PRN

## 2021-04-12 MED ORDER — FENTANYL CITRATE (PF) 100 MCG/2ML IJ SOLN
INTRAMUSCULAR | Status: DC | PRN
  Administered 2021-04-12: 100 ug via INTRAVENOUS
  Administered 2021-04-12: 50 ug via INTRAVENOUS

## 2021-04-12 SURGICAL SUPPLY — 68 items
APL SKNCLS STERI-STRIP NONHPOA (GAUZE/BANDAGES/DRESSINGS) ×1
BAG COUNTER SPONGE SURGICOUNT (BAG) ×3 IMPLANT
BAG SPNG CNTER NS LX DISP (BAG) ×2
BASKET BONE COLLECTION (BASKET) ×2 IMPLANT
BENZOIN TINCTURE PRP APPL 2/3 (GAUZE/BANDAGES/DRESSINGS) ×2 IMPLANT
BLADE CLIPPER SURG (BLADE) IMPLANT
BUR MATCHSTICK NEURO 3.0 LAGG (BURR) ×2 IMPLANT
BUR PRECISION FLUTE 6.0 (BURR) ×2 IMPLANT
CANISTER SUCT 3000ML PPV (MISCELLANEOUS) ×2 IMPLANT
CAP LOCK DLX THRD (Cap) ×6 IMPLANT
CARTRIDGE OIL MAESTRO DRILL (MISCELLANEOUS) ×1 IMPLANT
CNTNR URN SCR LID CUP LEK RST (MISCELLANEOUS) ×1 IMPLANT
CONT SPEC 4OZ STRL OR WHT (MISCELLANEOUS) ×2
COVER BACK TABLE 60X90IN (DRAPES) ×2 IMPLANT
DECANTER SPIKE VIAL GLASS SM (MISCELLANEOUS) ×2 IMPLANT
DIFFUSER DRILL AIR PNEUMATIC (MISCELLANEOUS) ×2 IMPLANT
DRAPE C-ARM 42X72 X-RAY (DRAPES) ×4 IMPLANT
DRAPE HALF SHEET 40X57 (DRAPES) ×2 IMPLANT
DRAPE LAPAROTOMY 100X72X124 (DRAPES) ×2 IMPLANT
DRAPE SURG 17X23 STRL (DRAPES) ×8 IMPLANT
DRSG OPSITE POSTOP 4X6 (GAUZE/BANDAGES/DRESSINGS) ×2 IMPLANT
DRSG OPSITE POSTOP 4X8 (GAUZE/BANDAGES/DRESSINGS) ×1 IMPLANT
ELECT BLADE 4.0 EZ CLEAN MEGAD (MISCELLANEOUS) ×2
ELECT REM PT RETURN 9FT ADLT (ELECTROSURGICAL) ×2
ELECTRODE BLDE 4.0 EZ CLN MEGD (MISCELLANEOUS) ×1 IMPLANT
ELECTRODE REM PT RTRN 9FT ADLT (ELECTROSURGICAL) ×1 IMPLANT
EVACUATOR 1/8 PVC DRAIN (DRAIN) IMPLANT
GAUZE 4X4 16PLY ~~LOC~~+RFID DBL (SPONGE) ×2 IMPLANT
GLOVE EXAM NITRILE XL STR (GLOVE) IMPLANT
GLOVE SURG ENC MOIS LTX SZ8 (GLOVE) ×4 IMPLANT
GLOVE SURG ENC MOIS LTX SZ8.5 (GLOVE) ×4 IMPLANT
GOWN STRL REUS W/ TWL LRG LVL3 (GOWN DISPOSABLE) IMPLANT
GOWN STRL REUS W/ TWL XL LVL3 (GOWN DISPOSABLE) ×2 IMPLANT
GOWN STRL REUS W/TWL 2XL LVL3 (GOWN DISPOSABLE) IMPLANT
GOWN STRL REUS W/TWL LRG LVL3 (GOWN DISPOSABLE)
GOWN STRL REUS W/TWL XL LVL3 (GOWN DISPOSABLE) ×4
GRAFT BONE PROTEIOS SM 1CC (Orthopedic Implant) ×1 IMPLANT
HEMOSTAT POWDER KIT SURGIFOAM (HEMOSTASIS) ×2 IMPLANT
KIT BASIN OR (CUSTOM PROCEDURE TRAY) ×2 IMPLANT
KIT TURNOVER KIT B (KITS) ×2 IMPLANT
MILL MEDIUM DISP (BLADE) ×1 IMPLANT
NDL HYPO 21X1.5 SAFETY (NEEDLE) IMPLANT
NEEDLE HYPO 21X1.5 SAFETY (NEEDLE) ×2 IMPLANT
NEEDLE HYPO 22GX1.5 SAFETY (NEEDLE) ×2 IMPLANT
NS IRRIG 1000ML POUR BTL (IV SOLUTION) ×2 IMPLANT
OIL CARTRIDGE MAESTRO DRILL (MISCELLANEOUS) ×2
PACK LAMINECTOMY NEURO (CUSTOM PROCEDURE TRAY) ×2 IMPLANT
PAD ARMBOARD 7.5X6 YLW CONV (MISCELLANEOUS) ×6 IMPLANT
PATTIES SURGICAL .5 X1 (DISPOSABLE) IMPLANT
PATTIES SURGICAL 1X1 (DISPOSABLE) ×1 IMPLANT
PUTTY DBM 10CC CALC GRAN (Putty) ×1 IMPLANT
ROD CURVED TI 6.35X60 (Rod) ×2 IMPLANT
SCREW PA DLX CREO 7.5X55 (Screw) ×2 IMPLANT
SCREW PA DLX CREO 8.5X50 (Screw) ×1 IMPLANT
SPACER ALTERA 10X31-15 (Spacer) ×1 IMPLANT
SPONGE NEURO XRAY DETECT 1X3 (DISPOSABLE) IMPLANT
SPONGE SURGIFOAM ABS GEL 100 (HEMOSTASIS) IMPLANT
SPONGE SURGIFOAM ABS GEL SZ50 (HEMOSTASIS) ×1 IMPLANT
SPONGE T-LAP 4X18 ~~LOC~~+RFID (SPONGE) ×1 IMPLANT
STRIP CLOSURE SKIN 1/2X4 (GAUZE/BANDAGES/DRESSINGS) ×3 IMPLANT
SUT VIC AB 1 CT1 18XBRD ANBCTR (SUTURE) ×2 IMPLANT
SUT VIC AB 1 CT1 8-18 (SUTURE) ×4
SUT VIC AB 2-0 CP2 18 (SUTURE) ×4 IMPLANT
SYR 20ML LL LF (SYRINGE) IMPLANT
TOWEL GREEN STERILE (TOWEL DISPOSABLE) ×2 IMPLANT
TOWEL GREEN STERILE FF (TOWEL DISPOSABLE) ×2 IMPLANT
TRAY FOLEY MTR SLVR 16FR STAT (SET/KITS/TRAYS/PACK) ×2 IMPLANT
WATER STERILE IRR 1000ML POUR (IV SOLUTION) ×2 IMPLANT

## 2021-04-12 NOTE — Progress Notes (Signed)
Orthopedic Tech Progress Note Patient Details:  Timothy Peters 1950/10/22 [id number]  Ortho Devices Type of Ortho Device: Lumbar corsett Ortho Device/Splint Interventions: Ordered      Danton Sewer A Carlon Davidson 04/12/2021, 2:58 PM
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
## 2021-04-12 NOTE — Transfer of Care (Signed)
Immediate Anesthesia Transfer of Care Note  Patient: Timothy Peters  Procedure(s) Performed: POSTERIOR LUMBAR INTERBODY FUSION,INTERBODY PROSTHESIS,POSTERIOR INSTRUMENTATION, LUMBAR THREE-LUMBAR FOUR; EXPLORE FUSION, RE-DO FUSION LUMBAR FOUR-LUMBAR FIVE  Patient Location: PACU  Anesthesia Type:General  Level of Consciousness: drowsy and patient cooperative  Airway & Oxygen Therapy: Patient Spontanous Breathing and Patient connected to nasal cannula oxygen  Post-op Assessment: Report given to RN and Post -op Vital signs reviewed and stable  Post vital signs: Reviewed and stable  Last Vitals:  Vitals Value Taken Time  BP 135/72 04/12/21 1246  Temp    Pulse 61 04/12/21 1251  Resp 15 04/12/21 1251  SpO2 100 % 04/12/21 1251  Vitals shown include unvalidated device data.  Last Pain:  Vitals:   04/12/21 0618  TempSrc: Oral  PainSc:       Patients Stated Pain Goal: 2 ([id number])  Complications: No notable events documented.

## 2021-04-12 NOTE — Anesthesia Procedure Notes (Signed)
Central Venous Catheter Insertion Performed by: Roderic Palau, MD, anesthesiologist Start/End8/25/2022 7:40 AM, 04/12/2021 7:55 AM Patient location: Pre-op. Preanesthetic checklist: patient identified, IV checked, site marked, risks and benefits discussed, surgical consent, monitors and equipment checked, pre-op evaluation, timeout performed and anesthesia consent Position: Trendelenburg Lidocaine 1% used for infiltration and patient sedated Hand hygiene performed , maximum sterile barriers used  and Seldinger technique used Catheter size: 8 Fr Total catheter length 16. Central line was placed.Double lumen Procedure performed using ultrasound guided technique. Ultrasound Notes:anatomy identified, needle tip was noted to be adjacent to the nerve/plexus identified, no ultrasound evidence of intravascular and/or intraneural injection and image(s) printed for medical record Attempts: 1 Following insertion, dressing applied, line sutured and Biopatch. Post procedure assessment: blood return through all ports  Patient tolerated the procedure well with no immediate complications.

## 2021-04-12 NOTE — Anesthesia Procedure Notes (Signed)
Procedure Name: Intubation Date/Time: 04/12/2021 8:19 AM Performed by: Georgia Duff, CRNA Pre-anesthesia Checklist: Patient identified, Emergency Drugs available, Suction available and Patient being monitored Patient Re-evaluated:Patient Re-evaluated prior to induction Oxygen Delivery Method: Circle System Utilized Preoxygenation: Pre-oxygenation with 100% oxygen Induction Type: IV induction Ventilation: Mask ventilation without difficulty Laryngoscope Size: Miller and 3 Grade View: Grade I Tube type: Oral Tube size: 7.5 mm Number of attempts: 1 Airway Equipment and Method: Stylet and Oral airway Placement Confirmation: ETT inserted through vocal cords under direct vision, positive ETCO2 and breath sounds checked- equal and bilateral Secured at: 22 cm Tube secured with: Tape Dental Injury: Teeth and Oropharynx as per pre-operative assessment

## 2021-04-12 NOTE — Op Note (Signed)
Brief history: The patient is a 70 year old black male on whom I performed an L4-5 instrumentation and fusion years ago.  He has developed recurrent back and left leg pain.  He has failed medical management.  He was worked up with a lumbar MRI and x-rays which demonstrated adjacent segment disease and spinal stenosis.  I discussed the various treatment options with him.  He has decided proceed with surgery.  Preoperative diagnosis: L3-4 degenerative disc disease, spinal stenosis compressing both the L3 and the L4 nerve roots; lumbago; lumbar radiculopathy; neurogenic claudication  Postoperative diagnosis: The same and pseudoarthrosis  Procedure: Bilateral bilateral L3-4 laminotomy/foraminotomies/medial facetectomy to decompress the bilateral L3 and L4 and nerve roots(the work required to do this was in addition to the work required to do the posterior lumbar interbody fusion because of the patient's spinal stenosis, facet arthropathy. Etc. requiring a wide decompression of the nerve roots.);  L3-4 transforaminal lumbar interbody fusion with local morselized autograft bone and Zimmer DBM; insertion of interbody prosthesis at L3-4 (globus peek expandable interbody prosthesis); posterior segmental instrumentation from L3 to L5 with globus titanium pedicle screws and rods; posterior lateral arthrodesis at L3-4 and redo posterior arthrodesis at L4-5 with Proteo OS,local morselized autograft bone and Zimmer DBM; exploration of lumbar fusion  Surgeon: Dr. Earle Gell  Asst.: Dr. Lorin Glass and Arnetha Massy, NP  Anesthesia: Gen. endotracheal  Estimated blood loss: 250 cc  Drains: None  Complications: None  Description of procedure: The patient was brought to the operating room by the anesthesia team. General endotracheal anesthesia was induced. The patient was turned to the prone position on the Wilson frame. The patient's lumbosacral region was then prepared with Betadine scrub and Betadine solution.  Sterile drapes were applied.  I then injected the area to be incised with Marcaine with epinephrine solution. I then used the scalpel to make a linear midline incision over the L3-4 and L4-5 interspace, incising through the old surgical scar. I then used electrocautery to perform a bilateral subperiosteal dissection exposing the spinous process and lamina of L3, L4 and L5, and expose the old hardware at L4-5. We then inserted the Verstrac retractor to provide exposure.  We explored the fusion by removing the caps from the old screws.  We found the right L5 screw to be loose indicative of a pseudoarthrosis at L4-5.  I began the decompression by using the high speed drill to perform laminotomies at L3-4 bilaterally, and a redo laminotomy at L4-5 on the left.. We then used the Kerrison punches to widen the laminotomy and removed the ligamentum flavum at L3-4 bilaterally and the scar tissue and L4-5 on the left.. We used the Kerrison punches to remove the medial facets at L3-4 bilaterally, we removed the facet on the left.. We performed wide foraminotomies about the bilateral L3, L4 and left L5 nerve roots completing the decompression.  I inspected the left L5 nerve root.  It did not appear to be compressed by the pedicle screw.  We now turned our attention to the posterior lumbar interbody fusion. I used a scalpel to incise the intervertebral disc at L3-4 bilaterally. I then performed a partial intervertebral discectomy at L3-4 bilaterally using the pituitary forceps. We prepared the vertebral endplates at [id number] bilaterally for the fusion by removing the soft tissues with the curettes. We then used the trial spacers to pick the appropriate sized interbody prosthesis. We prefilled his prosthesis with a combination of local morselized autograft bone that we obtained during the decompression as  well as Zimmer DBM. We inserted the prefilled prosthesis into the interspace at L3-4 from the left, we then turned and  expanded the prosthesis. There was a good snug fit of the prosthesis in the interspace. We then filled and the remainder of the intervertebral disc space with Proteo Os, local morselized autograft bone and Zimmer DBM. This completed the posterior lumbar interbody arthrodesis.  During the decompression and insertion of the prosthesis the assistant protected the thecal sac and nerve roots with the D'Errico retractor.  We now turned attention to the instrumentation. Under fluoroscopic guidance we cannulated the bilateral L3 pedicles with the bone probe. We then removed the bone probe. We then tapped the pedicle with a 6.5 millimeter tap. We then removed the tap. We probed inside the tapped pedicle with a ball probe to rule out cortical breaches. We then inserted a 7.5 x 55 millimeter pedicle screw into the L3 pedicles bilaterally under fluoroscopic guidance. We then palpated along the medial aspect of the pedicles to rule out cortical breaches. There were none. The nerve roots were not injured.  We remove the loose pedicle screw at L 5 on the right and replaced it with a 8.5 x 50 mm pedicle screw with better bony purchase.  We then connected the unilateral pedicle screws with a lordotic rod. We compressed the construct and secured the rod in place with the caps. We then tightened the caps appropriately. This completed the instrumentation from L3-L5 bilaterally.  We now turned our attention to the posterior lateral arthrodesis at L3-4 and the redo arthrodesis at L4-5.  We remove the soft tissue from the remainder of the facets at L4-5.  We used the high-speed drill to decorticate the remainder of the facets, pars, transverse process at L4-5 and L3-4. We then applied a combination of Proteo Os, local morselized autograft bone and Zimmer DBM over these decorticated posterior lateral structures. This completed the posterior lateral arthrodesis.  We then obtained hemostasis using bipolar electrocautery. We irrigated  the wound out with bacitracin solution. We inspected the thecal sac and nerve roots and noted they were well decompressed. We then removed the retractor.  We injected Exparel . We reapproximated patient's thoracolumbar fascia with interrupted #1 Vicryl suture. We reapproximated patient's subcutaneous tissue with interrupted 2-0 Vicryl suture. The reapproximated patient's skin with Steri-Strips and benzoin. The wound was then coated with bacitracin ointment. A sterile dressing was applied. The drapes were removed. The patient was subsequently returned to the supine position where they were extubated by the anesthesia team. He was then transported to the post anesthesia care unit in stable condition. All sponge instrument and needle counts were reportedly correct at the end of this case.

## 2021-04-13 ENCOUNTER — Other Ambulatory Visit (HOSPITAL_COMMUNITY): Payer: Self-pay

## 2021-04-13 ENCOUNTER — Encounter (HOSPITAL_COMMUNITY): Payer: Self-pay | Admitting: Neurosurgery

## 2021-04-13 DIAGNOSIS — M48061 Spinal stenosis, lumbar region without neurogenic claudication: Secondary | ICD-10-CM | POA: Diagnosis not present

## 2021-04-13 LAB — BASIC METABOLIC PANEL
Anion gap: 9 (ref 5–15)
BUN: 23 mg/dL (ref 8–23)
CO2: 25 mmol/L (ref 22–32)
Calcium: 8.9 mg/dL (ref 8.9–10.3)
Chloride: 101 mmol/L (ref 98–111)
Creatinine, Ser: 0.9 mg/dL (ref 0.61–1.24)
GFR, Estimated: >60 mL/min (ref >60)
Glucose, Bld: 226 mg/dL — ABNORMAL HIGH (ref 70–99)
Potassium: 4.1 mmol/L (ref 3.5–5.1)
Sodium: 135 mmol/L (ref 135–145)

## 2021-04-13 LAB — CBC
HCT: 30 % — ABNORMAL LOW (ref 39.0–52.0)
Hemoglobin: 10.2 g/dL — ABNORMAL LOW (ref 13.0–17.0)
MCH: 31.8 pg (ref 26.0–34.0)
MCHC: 34 g/dL (ref 30.0–36.0)
MCV: 93.5 fL (ref 80.0–100.0)
Platelets: 204 10*3/uL (ref 150–400)
RBC: 3.21 MIL/uL — ABNORMAL LOW (ref 4.22–5.81)
RDW: 11.4 % — ABNORMAL LOW (ref 11.5–15.5)
WBC: 7.8 10*3/uL (ref 4.0–10.5)
nRBC: 0 % (ref 0.0–0.2)

## 2021-04-13 LAB — GLUCOSE, CAPILLARY
Glucose-Capillary: 236 mg/dL — ABNORMAL HIGH (ref 70–99)
Glucose-Capillary: 269 mg/dL — ABNORMAL HIGH (ref 70–99)

## 2021-04-13 MED ORDER — CEPHALEXIN 500 MG PO CAPS
500.0000 mg | ORAL_CAPSULE | Freq: Four times a day (QID) | ORAL | Status: DC
  Administered 2021-04-13: 500 mg via ORAL
  Filled 2021-04-13 (×3): qty 1

## 2021-04-13 MED ORDER — DOCUSATE SODIUM 100 MG PO CAPS
100.0000 mg | ORAL_CAPSULE | Freq: Two times a day (BID) | ORAL | 0 refills | Status: DC
  Filled 2021-04-13: qty 10, 5d supply, fill #0

## 2021-04-13 MED ORDER — CYCLOBENZAPRINE HCL 10 MG PO TABS
10.0000 mg | ORAL_TABLET | Freq: Three times a day (TID) | ORAL | 0 refills | Status: DC | PRN
  Filled 2021-04-13: qty 30, 10d supply, fill #0

## 2021-04-13 MED ORDER — OXYCODONE HCL 10 MG PO TABS
10.0000 mg | ORAL_TABLET | ORAL | 0 refills | Status: DC | PRN
  Filled 2021-04-13: qty 30, 5d supply, fill #0

## 2021-04-13 MED ORDER — CEPHALEXIN 500 MG PO CAPS
500.0000 mg | ORAL_CAPSULE | Freq: Four times a day (QID) | ORAL | 0 refills | Status: DC
  Filled 2021-04-13: qty 20, 5d supply, fill #0

## 2021-04-13 NOTE — TOC Progression Note (Signed)
Transition of Care [facility]) - Progression Note    Patient Details  Name: Timothy Peters MRN: [id number] Date of Birth: October 08, 1950  Transition of Care [facility]) CM/SW [place], [place] Phone Number: 04/13/2021, 11:26 AM  Clinical Narrative:      CSW called Amy with [facility]. Confirmed pt was arranged [place] with them. Informed them that pt is discharging today. CSW notified pt and included [facility] home health in follow up provider. TOC sign off for now.       Expected Discharge Plan and [place] with [place] Determinants of Health (SDOH) Interventions    Readmission Risk Interventions No flowsheet data found.

## 2021-04-13 NOTE — Plan of Care (Signed)
Adequately Ready for Discharge 

## 2021-04-13 NOTE — Evaluation (Signed)
Occupational Therapy Evaluation Patient Details Name: Timothy Peters MRN: [id number] DOB: 05-19-1951 Today's Date: 04/13/2021    History of Present Illness 70 y.o. 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 Pt underwent bilateral L3-4 decompression and PLIF on 8/25. PMH includes COVID-19, DM, hepatitis, HLD, HTN.   Clinical Impression   Pt was assisted for tub transfer, showering, housekeeping and meal prep by his wife prior to admission. He was walking with a walker and sitting to shower. Pt presents with minimal back pain, decreased standing balance and impaired memory. Educated pt in back precautions related to bed mobility and ADL and reinforced with written handout.     Follow Up Recommendations  No OT follow up    Equipment Recommendations  None recommended by OT    Recommendations for Other Services       Precautions / Restrictions Precautions Precautions: Back;Fall Precaution Booklet Issued: Yes (comment) Precaution Comments: cues to recall no twisting Required Braces or Orthoses: Spinal Brace Spinal Brace: Lumbar corset;Applied in sitting position Restrictions Weight Bearing Restrictions: No      Mobility Bed Mobility Overal bed mobility: Needs Assistance Bed Mobility: Sidelying to Sit;Sit to Sidelying  Sidelying to sit: Supervision     Sit to sidelying: Supervision      Transfers Overall transfer level: Needs assistance Equipment used: Rolling walker (2 wheeled) Transfers: Sit to/from Stand Sit to Stand: Supervision              Balance Overall balance assessment: Needs assistance Sitting-balance support: No upper extremity supported;Feet supported Sitting balance-Leahy Scale: Good     Standing balance support: Bilateral upper extremity supported Standing balance-Leahy Scale: Poor Standing balance comment: reliant on BUE support of RW                           ADL either  performed or assessed with clinical judgement   ADL Overall ADL's : Needs assistance/impaired Eating/Feeding: Independent   Grooming: Supervision/safety;Standing Grooming Details (indicate cue type and reason): educated in 2 cup method for oral care and use of wash cloth on face at sink Upper Body Bathing: Minimal assistance;Sitting Upper Body Bathing Details (indicate cue type and reason): wife washes his back Lower Body Bathing: Supervison/ safety;Sitting/lateral leans   Upper Body Dressing : Set up;Sitting   Lower Body Dressing: Supervision/safety;Sit to/from stand Lower Body Dressing Details (indicate cue type and reason): able to perform figure 4 Toilet Transfer: Min guard;Ambulation;RW Toilet Transfer Details (indicate cue type and reason): educated pt to use BSC over toilet   Toileting - Clothing Manipulation Details (indicate cue type and reason): instructed pt to avoid twisting with pericare, use of tongs as needed to extend reach, wet wipes     Functional mobility during ADLs: Min guard;Rolling walker       Vision Baseline Vision/History: 1 Wears glasses Patient Visual Report: No change from baseline       Perception     Praxis      Pertinent Vitals/Pain Pain Assessment: 0-10 Pain Score: 3  Pain Location: back Pain Descriptors / Indicators: Aching;Sore Pain Intervention(s): Monitored during session;Repositioned     Hand Dominance Right   Extremity/Trunk Assessment Upper Extremity Assessment Upper Extremity Assessment: Overall WFL for tasks assessed   Lower Extremity Assessment Lower Extremity Assessment: Defer to PT evaluation    Cervical / Trunk Assessment Cervical / Trunk Assessment: Other exceptions Cervical / Trunk Exceptions: s/p L3-4 PLIF   Communication  Communication Communication: HOH   Cognition Arousal/Alertness: Awake/alert Behavior During Therapy: WFL for tasks assessed/performed Overall Cognitive Status: Impaired/Different from  baseline Area of Impairment: Memory                     Memory: Decreased recall of precautions             General Comments  VSS on RA    Exercises     Shoulder Instructions      Home Living Family/patient expects to be discharged to:: Private residence Living Arrangements: Spouse/significant other Available Help at Discharge: Family;Available 24 hours/day Type of Home: House Home Access: Stairs to enter [employer] of Steps: 2 Entrance Stairs-Rails: Can reach both Home Layout: One level     Bathroom Shower/Tub: Teacher, early years/pre: Standard     Home Equipment: Cane - single point;Wheelchair - manual;Walker - 2 wheels;Grab bars - tub/shower;Shower seat;Bedside commode          Prior Functioning/Environment Level of Independence: Needs assistance  Gait / Transfers Assistance Needed: pt ambulates with use of RW, reports frequent falls (~12 in past 3 months) due to L knee buckling ADL's / Homemaking Assistance Needed: pt receives assistance for showering, housekeeping and meal prep from spouse            OT Problem List:        OT Treatment/Interventions:      OT Goals(Current goals can be found in the care plan section) Acute Rehab OT Goals Patient Stated Goal: to improve LE strength and stop falling  OT Frequency:     Barriers to D/C:            Co-evaluation              AM-PAC OT "6 Clicks" Daily Activity     Outcome Measure Help from another person eating meals?: None Help from another person taking care of personal grooming?: A Little Help from another person toileting, which includes using toliet, bedpan, or urinal?: A Little Help from another person bathing (including washing, rinsing, drying)?: A Little Help from another person to put on and taking off regular upper body clothing?: None Help from another person to put on and taking off regular lower body clothing?: A Little 6 Click Score: 20   End  of Session Equipment Utilized During Treatment: Rolling walker;Back brace  Activity Tolerance: Patient tolerated treatment well Patient left: in bed;with call bell/phone within reach  OT Visit Diagnosis: Other abnormalities of gait and mobility (R26.89)                [id number] OT Time Calculation (min): 20 min Charges:  OT General Charges $OT Visit: 1 Visit OT Evaluation $OT Eval Low Complexity: 1 Low  Nestor Lewandowsky, OTR/L Acute Rehabilitation Services Pager: [phone] Office: [phone]   Malka So 04/13/2021, 9:13 AM

## 2021-04-13 NOTE — Anesthesia Postprocedure Evaluation (Signed)
Anesthesia Post Note  Patient: Timothy Peters  Procedure(s) Performed: POSTERIOR LUMBAR INTERBODY FUSION,INTERBODY PROSTHESIS,POSTERIOR INSTRUMENTATION, LUMBAR THREE-LUMBAR FOUR; EXPLORE FUSION, RE-DO FUSION LUMBAR FOUR-LUMBAR FIVE     Patient location during evaluation: PACU Anesthesia Type: General Level of consciousness: awake and alert Pain management: pain level controlled Vital Signs Assessment: post-procedure vital signs reviewed and stable Respiratory status: spontaneous breathing, nonlabored ventilation and respiratory function stable Cardiovascular status: blood pressure returned to baseline and stable Postop Assessment: no apparent nausea or vomiting Anesthetic complications: no   No notable events documented.  Last Vitals:  Vitals:   04/13/21 0452 04/13/21 0800  BP: (!) 149/81 (!) 153/90  Pulse: 93 (!) 102  Resp: 18 17  Temp: 36.7 C 36.7 C  SpO2: 100% 100%    Last Pain:  Vitals:   04/13/21 0800  TempSrc: Oral  PainSc:                  [name]

## 2021-04-13 NOTE — Discharge Instructions (Addendum)
  Wound Care Keep incision covered and dry for two days.    Do not put any creams, lotions, or ointments on incision. Leave steri-strips on back.  They will fall off by themselves.  Activity Walk each and every day, increasing distance each day. No lifting greater than 5 lbs. No driving for 2 weeks; may ride as a passenger locally.  Diet Resume your normal diet.     Call Your Doctor If Any of These Occur Redness, drainage, or swelling at the wound.  Temperature greater than 101 degrees. Severe pain not relieved by pain medication. Incision starts to come apart.  Follow Up Appt Call today for appointment in 2-3 weeks [phone]) or for problems.  If you have any hardware placed in your spine, you will need an x-ray before your appointment.

## 2021-04-13 NOTE — Evaluation (Signed)
Physical Therapy Evaluation Patient Details Name: Timothy Peters MRN: [id number] DOB: 07/28/1951 Today's Date: 04/13/2021   History of Present Illness  70 y.o. 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 Pt underwent bilateral L3-4 decompression and PLIF on 8/25. PMH includes COVID-19, DM, hepatitis, HLD, HTN.  Clinical Impression  Pt presents to PT with deficits in strength, power, balance, gait, sensation. PT notes muscle wasting of L quadriceps, pt reporting multiple previous falls due to L knee buckling. Pt shows no L knee buckling during this session, ambulating for household distances. Pt requires verbal cues to reduce twisting at this time. Pt will benefit from continued aggressive mobilization and PT POC to reduce falls risk and improve LE strength. PT recommends HHPT at the time of discharge to initiate LE strengthening program and continue gait/balance training.    Follow Up Recommendations Home health PT;Supervision for mobility/OOB    Equipment Recommendations  None recommended by PT    Recommendations for Other Services       Precautions / Restrictions Precautions Precautions: Back;Fall Precaution Booklet Issued: Yes (comment) Precaution Comments: pt able to recall no bending and twisting from previous back surgeries, requires cueing for no lifting and for brace use Required Braces or Orthoses: Spinal Brace Spinal Brace: Lumbar corset;Applied in sitting position Restrictions Weight Bearing Restrictions: No      Mobility  Bed Mobility Overal bed mobility: Needs Assistance Bed Mobility: Rolling;Sidelying to Sit Rolling: Supervision Sidelying to sit: Supervision            Transfers Overall transfer level: Needs assistance Equipment used: Rolling walker (2 wheeled) Transfers: Sit to/from Stand Sit to Stand: Supervision            Ambulation/Gait Ambulation/Gait assistance: Min guard Gait Distance  (Feet): 120 Feet (120' x 2) Assistive device: Rolling walker (2 wheeled) Gait Pattern/deviations: Step-through pattern Gait velocity: functional Gait velocity interpretation: 1.31 - 2.62 ft/sec, indicative of limited community ambulator General Gait Details: pt with slowed step-through gait, initially with less fluid gait, pushing walker forward and following with 2 steps to meet walker. During 2nd bout of gait pt with more continuous step-through gait pattern. PT notes no L knee buckling during session. PT provides cues to turn trunk and LEs at same time to avoid twisting.  Stairs Stairs:  (pt declines stair negotiation. PT provides cues to ascend with RLE first and descend with LLE first due to LLE weakness)          Wheelchair Mobility    Modified Rankin (Stroke Patients Only)       Balance Overall balance assessment: Needs assistance Sitting-balance support: No upper extremity supported;Feet supported Sitting balance-Leahy Scale: Good     Standing balance support: Bilateral upper extremity supported Standing balance-Leahy Scale: Poor Standing balance comment: reliant on BUE support of RW                             Pertinent Vitals/Pain Pain Assessment: 0-10 Pain Score: 5  Pain Location: back Pain Descriptors / Indicators: Aching Pain Intervention(s): Monitored during session    Home Living Family/patient expects to be discharged to:: Private residence Living Arrangements: Spouse/significant other Available Help at Discharge: Family;Available 24 hours/day (spouse and daughter) Type of Home: House Home Access: Stairs to enter Entrance Stairs-Rails: Can reach both Entrance Stairs-Number of Steps: 2 Home Layout: One level Home Equipment: Cane - single point;Wheelchair - manual;Walker - 2 wheels;Grab bars -  tub/shower;Shower seat;Bedside commode      Prior Function Level of Independence: Needs assistance   Gait / Transfers Assistance Needed: pt  ambulates with use of RW, reports frequent falls (~12 in past 3 months) due to L knee buckling  ADL's / Homemaking Assistance Needed: pt receives assistance for bating from spouse        Hand Dominance   Dominant Hand: Right    Extremity/Trunk Assessment   Upper Extremity Assessment Upper Extremity Assessment: Overall WFL for tasks assessed    Lower Extremity Assessment Lower Extremity Assessment: LLE deficits/detail LLE Deficits / Details: PT notes muscle wasting on L quadriceps compatred to R. L knee extension 4-/5, L hip flexion 3+/5    Cervical / Trunk Assessment Cervical / Trunk Assessment: Other exceptions Cervical / Trunk Exceptions: s/p L3-4 PLIF  Communication   Communication: No difficulties  Cognition Arousal/Alertness: Awake/alert Behavior During Therapy: WFL for tasks assessed/performed Overall Cognitive Status: Impaired/Different from baseline Area of Impairment: Memory                     Memory: Decreased recall of precautions                General Comments General comments (skin integrity, edema, etc.): VSS on RA    Exercises     Assessment/Plan    PT Assessment Patient needs continued PT services  PT Problem List Decreased strength;Decreased activity tolerance;Decreased mobility;Decreased balance;Decreased safety awareness;Decreased knowledge of precautions;Pain       PT Treatment Interventions DME instruction;Gait training;Stair training;Functional mobility training;Therapeutic activities;Therapeutic exercise;Balance training;Neuromuscular re-education;Patient/family education    PT Goals (Current goals can be found in the Care Plan section)  Acute Rehab PT Goals Patient Stated Goal: to improve LE strength and stop falling PT Goal Formulation: With patient Time For Goal Achievement: 04/27/21 Potential to Achieve Goals: Good    Frequency 7X/week   Barriers to discharge        Co-evaluation               AM-PAC PT  "6 Clicks" Mobility  Outcome Measure Help needed turning from your back to your side while in a flat bed without using bedrails?: A Little Help needed moving from lying on your back to sitting on the side of a flat bed without using bedrails?: A Little Help needed moving to and from a bed to a chair (including a wheelchair)?: A Little Help needed standing up from a chair using your arms (e.g., wheelchair or bedside chair)?: A Little Help needed to walk in hospital room?: A Little Help needed climbing 3-5 steps with a railing? : A Little 6 Click Score: 18    End of Session Equipment Utilized During Treatment: Back brace Activity Tolerance: Patient tolerated treatment well Patient left: in bed;with call bell/phone within reach Nurse Communication: Mobility status PT Visit Diagnosis: Unsteadiness on feet (R26.81);Other abnormalities of gait and mobility (R26.89);Repeated falls (R29.6);History of falling (Z91.81);Muscle weakness (generalized) (M62.81)    Time: [id number] PT Time Calculation (min) (ACUTE ONLY): 29 min   Charges:   PT Evaluation $PT Eval Low Complexity: 1 Low          Zenaida Niece, PT, DPT Acute Rehabilitation Pager: [phone]   Zenaida Niece 04/13/2021, 8:51 AM

## 2021-04-13 NOTE — Discharge Summary (Signed)
Physician Discharge Summary     Providing Compassionate, Quality Care - Together   Patient ID: Timothy Peters MRN: [id number] DOB/AGE: 1950-09-16 70 y.o.  Admit date: 04/12/2021 Discharge date: 04/13/2021  Admission Diagnoses:  lumbar adjacent segment disease with spondylolisthesis  Discharge Diagnoses:  Active Problems:   Lumbar adjacent segment disease with spondylolisthesis   Discharged Condition: good  Hospital Course: Patient underwent an L3-4 PLIF with posterior lateral  arthrodesis down to the previous fusion at L4-5 by Dr. Arnoldo Morale on 04/12/2021. He was admitted to 3C09 following recovery from anesthesia in the PACU. His postoperative course has been uncomplicated. He did have some prolonged sanguinous drainage from his surgical wound. He has been started on Keflex and he will remain on this for five days. He has worked with both physical and occupational therapies who feel the patient is ready for discharge home with Home Health PT/OT. He is ambulating independently and without difficulty. He is tolerating a normal diet. He is not having any bowel or bladder dysfunction. His pain is well-controlled with oral pain medication. He is ready for discharge home.   Consults: rehabilitation medicine  Significant Diagnostic Studies: radiology: DG Lumbar Spine 2-3 Views  Result Date: 04/12/2021 CLINICAL DATA:  L3-L4 PLIF extension of prior L4-L5 fusion EXAM: LUMBAR SPINE - 2-3 VIEW COMPARISON:  01/26/2021 lumbar spine radiographs. FINDINGS: Status post interval PLIF at L3-L4, with new bilateral pedicle screws seen in L3 and interbody disc spacer in the L3-L4 disc space. There is no evidence of lumbar spine fracture. Fluoro time: 16 seconds mGy: 14.71 IMPRESSION: Status post interval extension of fusion to L3-L4. Electronically Signed   By: Merilyn Baba M.D.   On: 04/12/2021 12:44   DG CHEST PORT 1 VIEW  Result Date: 04/12/2021 CLINICAL DATA:  Encounter for central line placement Z45.2  (ICD-10-CM) 05/14/2020. EXAM: PORTABLE CHEST 1 VIEW COMPARISON:  05/14/2020. FINDINGS: Interval placement of a right IJ approach central venous catheter. When accounting for patient rotation, the tip likely projects in the expected location of the SVC.No visible pneumothorax or pleural effusions on this semi erect radiograph. Streaky bibasilar opacities. Similar mild enlargement of the cardiac silhouette, accentuated by low lung volumes and AP technique. IMPRESSION: 1. Interval placement of a right IJ approach central venous catheter. When accounting for patient rotation, the tip likely projects in the expected location of the SVC. No visible pneumothorax on this semi erect radiograph. 2. Streaky bibasilar opacities, most likely atelectasis. Electronically Signed   By: Margaretha Sheffield M.D.   On: 04/12/2021 13:34   DG C-Arm 1-60 Min-No Report  Result Date: 04/12/2021 Fluoroscopy was utilized by the requesting physician.  No radiographic interpretation.   DG OR LOCAL ABDOMEN  Result Date: 04/12/2021 CLINICAL DATA:  Instrument count, 2 broken instruments during procedure EXAM: OR LOCAL ABDOMEN COMPARISON:  Abdomen radiograph 03/16/2018 FINDINGS: Status post L3-L5 posterior fusion with interbody disc spacers. No unexpected foreign body is seen. The bowel gas pattern is normal. No radio-opaque calculi or other significant radiographic abnormality are seen. IMPRESSION: Status post L3-L5 posterior fusion with interbody disc spacers. No unexpected foreign body seen. Electronically Signed   By: Merilyn Baba M.D.   On: 04/12/2021 12:42     Treatments: surgery: Bilateral bilateral L3-4 laminotomy/foraminotomies/medial facetectomy to decompress the bilateral L3 and L4 and nerve roots(the work required to do this was in addition to the work required to do the posterior lumbar interbody fusion because of the patient's spinal stenosis, facet arthropathy. Etc. requiring a wide decompression of  the nerve roots.);  L3-4  transforaminal lumbar interbody fusion with local morselized autograft bone and Zimmer DBM; insertion of interbody prosthesis at L3-4 (globus peek expandable interbody prosthesis); posterior segmental instrumentation from L3 to L5 with globus titanium pedicle screws and rods; posterior lateral arthrodesis at L3-4 and redo posterior arthrodesis at L4-5 with Proteo OS,local morselized autograft bone and Zimmer DBM; exploration of lumbar fusion  Discharge Exam: Blood pressure (!) 153/90, pulse (!) 102, temperature 98.1 F (36.7 C), temperature source Oral, resp. rate 17, height '5\' 11"'$  (1.803 m), weight 86.6 kg, SpO2 100 %.  Per report: Alert and oriented x 4 PERRLA CN II-XII grossly intact MAE, Strength and sensation intact Incision is covered with Honeycomb dressing and Steri Strips; Dressing is intact with some sanguinous drainage   Disposition: Discharge disposition: 06-Home-Health Care Svc       Discharge Instructions     Face-to-face encounter (required for Medicare/Medicaid patients)   Complete by: As directed    I Patricia Nettle certify that this patient is under my care and that I, or a nurse practitioner or physician's assistant working with me, had a face-to-face encounter that meets the physician face-to-face encounter requirements with this patient on 04/13/2021. The encounter with the patient was in whole, or in part for the following medical condition(s) which is the primary reason for home health care (List medical condition): Lumbar stenosis   The encounter with the patient was in whole, or in part, for the following medical condition, which is the primary reason for home health care: Lumbar stenosis   I certify that, based on my findings, the following services are medically necessary home health services: Physical therapy   Reason for Medically Necessary Home Health Services: Therapy- Therapeutic Exercises to Increase Strength and Endurance   My clinical findings support  the need for the above services: Unable to leave home safely without assistance and/or assistive device   Further, I certify that my clinical findings support that this patient is homebound due to: Unable to leave home safely without assistance   Home Health   Complete by: As directed    To provide the following care/treatments:  PT OT        Allergies as of 04/13/2021   No Known Allergies      Medication List     TAKE these medications    aspirin 81 MG tablet Take 1 tablet (81 mg total) by mouth daily. Resume 4 days post-op   Basaglar KwikPen 100 UNIT/ML Inject 15 units subcutaneously once a day What changed:  when to take this additional instructions   cephALEXin 500 MG capsule Commonly known as: KEFLEX Take 1 capsule (500 mg total) by mouth every 6 (six) hours for 5 days   cyclobenzaprine 10 MG tablet Commonly known as: FLEXERIL Take 1 tablet (10 mg total) by mouth 3 (three) times daily as needed for muscle spasms. What changed: reasons to take this   docusate sodium 100 MG capsule Commonly known as: COLACE Take 1 capsule (100 mg total) by mouth 2 (two) times daily. What changed:  how much to take how to take this when to take this   DULoxetine 30 MG capsule Commonly known as: CYMBALTA Take 1 capsule (30 mg total) by mouth daily.   DULoxetine 60 MG capsule Commonly known as: CYMBALTA Take 1 capsule (60 mg total) by mouth daily.   ferrous sulfate 325 (65 FE) MG tablet Take 325 mg by mouth daily with breakfast.   gabapentin 300  MG capsule Commonly known as: NEURONTIN Take 1 capsule (300 mg total) by mouth 4 (four) times daily.   glipiZIDE 5 MG tablet Commonly known as: GLUCOTROL Take 1 tablet daily with either lunch or dinner. If blood sugar less than 150, hold your dose. Patient now has BCBS What changed: Another medication with the same name was removed. Continue taking this medication, and follow the directions you see here.   glucose blood test  strip Use as instructed   HYDROcodone-acetaminophen 5-325 MG tablet Commonly known as: NORCO/VICODIN Take 1 tablet by mouth every 6 (six) hours as needed for pain. What changed: Another medication with the same name was removed. Continue taking this medication, and follow the directions you see here.   Insulin Pen Needle 31G X 5 MM Misc 1 Units by Does not apply route as directed.   LINIMENTS EX Apply 1 application topically daily as needed (back pain). Horse Liniments for pain.   losartan-hydrochlorothiazide 100-12.5 MG tablet Commonly known as: HYZAAR TAKE 1 TABLET BY MOUTH ONCE DAILY   metFORMIN 1000 MG tablet Commonly known as: GLUCOPHAGE TAKE 1 TABLET BY MOUTH 2 TIMES A DAY WITH A MEAL   Oxycodone HCl 10 MG Tabs Take 1 tablet (10 mg total) by mouth every 4 (four) hours as needed for severe pain ((score 7 to 10)).   Paxlovid 20 x 150 MG & 10 x '100MG'$  Tbpk Generic drug: nirmatrelvir & ritonavir Take 3 tablets by mouth twice daily for 5 days as directed on package.   sildenafil 100 MG tablet Commonly known as: VIAGRA TAKE 1 TABLET BY MOUTH ONCE A DAY AS NEEDED   simvastatin 20 MG tablet Commonly known as: ZOCOR TAKE 1 TABLET BY MOUTH ONCE DAILY.   Trulicity 1.5 0000000 Sopn Generic drug: Dulaglutide Inject 1.5 mg into the skin once a week. Takes on Friday What changed: Another medication with the same name was changed. Make sure you understand how and when to take each.   Trulicity 1.5 0000000 Sopn Generic drug: Dulaglutide Inject 1.5 mg into the skin once a week. What changed: additional instructions        Follow-up Information     [facility] Follow up.   Specialty: [place] Why: Home Health is set up with [facility] ([facility] They will call you to arrange [facility] appointment. If  you do not hear from them please call their number above. Contact information: [place]  [address] [phone]         Newman Pies, MD. Go on 05/04/2021.   Specialty: Neurosurgery Why: Contact the office if drainage doesn't improve over the next 2 days.  Your 1st postop appointment is on 05/04/2021 at 1:15 p.m. Contact information: [address] [place] [address] [phone]                 Signed: Viona Gilmore, DNP, AGNP-C Nurse Practitioner  [facility] [place] [address], [address], [place], [place] [address] P: [phone]    F: [phone]  04/13/2021, 12:55 PM

## 2021-04-13 NOTE — Progress Notes (Signed)
Subjective: The patient is alert and pleasant.  He is in no apparent distress.  He says he had some left leg weakness last night but it feels much better today.  He has had some bloody wound drainage.  Objective: Vital signs in last 24 hours: Temp:  [97.7 F (36.5 C)-98.4 F (36.9 C)] 98 F (36.7 C) (08/26 0452) Pulse Rate:  [59-93] 93 (08/26 0452) Resp:  [10-18] 18 (08/26 0452) BP: (116-169)/(71-109) 149/81 (08/26 0452) SpO2:  [96 %-100 %] 100 % (08/26 0452) Estimated body mass index is 26.64 kg/m as calculated from the following:   Height as of this encounter: '5\' 11"'$  (1.803 m).   Weight as of this encounter: 86.6 kg.   Intake/Output from previous day: 08/25 0701 - 08/26 0700 In: 1100 [I.V.:1000; IV Piggyback:100] Out: 820 [Urine:620; Blood:200] Intake/Output this shift: No intake/output data recorded.  Physical exam the patient is alert and pleasant.  His strength is grossly normal in spinal quadricep, gastrocnemius and dorsiflexors.  Lab Results: Recent Labs    04/13/21 0221  WBC 7.8  HGB 10.2*  HCT 30.0*  PLT 204   BMET Recent Labs    04/13/21 0221  NA 135  K 4.1  CL 101  CO2 25  GLUCOSE 226*  BUN 23  CREATININE 0.90  CALCIUM 8.9    Studies/Results: DG Lumbar Spine 2-3 Views  Result Date: 04/12/2021 CLINICAL DATA:  L3-L4 PLIF extension of prior L4-L5 fusion EXAM: LUMBAR SPINE - 2-3 VIEW COMPARISON:  01/26/2021 lumbar spine radiographs. FINDINGS: Status post interval PLIF at L3-L4, with new bilateral pedicle screws seen in L3 and interbody disc spacer in the L3-L4 disc space. There is no evidence of lumbar spine fracture. Fluoro time: 16 seconds mGy: 14.71 IMPRESSION: Status post interval extension of fusion to L3-L4. Electronically Signed   By: Merilyn Baba M.D.   On: 04/12/2021 12:44   DG CHEST PORT 1 VIEW  Result Date: 04/12/2021 CLINICAL DATA:  Encounter for central line placement Z45.2 (ICD-10-CM) 05/14/2020. EXAM: PORTABLE CHEST 1 VIEW COMPARISON:   05/14/2020. FINDINGS: Interval placement of a right IJ approach central venous catheter. When accounting for patient rotation, the tip likely projects in the expected location of the SVC.No visible pneumothorax or pleural effusions on this semi erect radiograph. Streaky bibasilar opacities. Similar mild enlargement of the cardiac silhouette, accentuated by low lung volumes and AP technique. IMPRESSION: 1. Interval placement of a right IJ approach central venous catheter. When accounting for patient rotation, the tip likely projects in the expected location of the SVC. No visible pneumothorax on this semi erect radiograph. 2. Streaky bibasilar opacities, most likely atelectasis. Electronically Signed   By: Margaretha Sheffield M.D.   On: 04/12/2021 13:34   DG C-Arm 1-60 Min-No Report  Result Date: 04/12/2021 Fluoroscopy was utilized by the requesting physician.  No radiographic interpretation.   DG OR LOCAL ABDOMEN  Result Date: 04/12/2021 CLINICAL DATA:  Instrument count, 2 broken instruments during procedure EXAM: OR LOCAL ABDOMEN COMPARISON:  Abdomen radiograph 03/16/2018 FINDINGS: Status post L3-L5 posterior fusion with interbody disc spacers. No unexpected foreign body is seen. The bowel gas pattern is normal. No radio-opaque calculi or other significant radiographic abnormality are seen. IMPRESSION: Status post L3-L5 posterior fusion with interbody disc spacers. No unexpected foreign body seen. Electronically Signed   By: Merilyn Baba M.D.   On: 04/12/2021 12:42    Assessment/Plan: Postop day #1: I will add Keflex because of his wound drainage.  We will mobilize him with PT and  OT.  He will likely go home today or tomorrow.  LOS: 0 days     Ophelia Charter 04/13/2021, 7:02 AM     Patient ID: Timothy Peters, male   DOB: 30-Oct-1950, 70 y.o.   MRN: [id number]

## 2021-04-16 MED FILL — Sodium Chloride IV Soln 0.9%: INTRAVENOUS | Qty: 1000 | Status: AC

## 2021-04-16 MED FILL — Heparin Sodium (Porcine) Inj 1000 Unit/ML: INTRAMUSCULAR | Qty: 30 | Status: AC

## 2021-04-27 ENCOUNTER — Other Ambulatory Visit: Payer: Self-pay

## 2021-04-27 ENCOUNTER — Other Ambulatory Visit (HOSPITAL_COMMUNITY): Payer: Self-pay

## 2021-04-27 MED ORDER — METFORMIN HCL 1000 MG PO TABS
1000.0000 mg | ORAL_TABLET | Freq: Two times a day (BID) | ORAL | 1 refills | Status: DC
  Filled 2021-04-27: qty 180, 90d supply, fill #0

## 2021-04-27 MED ORDER — METFORMIN HCL 1000 MG PO TABS
1000.0000 mg | ORAL_TABLET | Freq: Two times a day (BID) | ORAL | 1 refills | Status: DC
  Filled 2021-04-27: qty 180, 90d supply, fill #0
  Filled 2021-11-01: qty 180, 90d supply, fill #1

## 2021-04-27 MED FILL — Losartan Potassium & Hydrochlorothiazide Tab 100-12.5 MG: ORAL | 30 days supply | Qty: 30 | Fill #4 | Status: AC

## 2021-04-28 ENCOUNTER — Other Ambulatory Visit (HOSPITAL_COMMUNITY): Payer: Self-pay

## 2021-04-28 MED ORDER — OXYCODONE HCL 10 MG PO TABS
10.0000 mg | ORAL_TABLET | Freq: Four times a day (QID) | ORAL | 0 refills | Status: DC | PRN
  Filled 2021-04-28: qty 40, 10d supply, fill #0

## 2021-04-30 ENCOUNTER — Other Ambulatory Visit (HOSPITAL_COMMUNITY): Payer: Self-pay

## 2021-05-08 ENCOUNTER — Other Ambulatory Visit (HOSPITAL_COMMUNITY): Payer: Self-pay

## 2021-05-08 MED ORDER — OXYCODONE-ACETAMINOPHEN 5-325 MG PO TABS
1.0000 | ORAL_TABLET | Freq: Four times a day (QID) | ORAL | 0 refills | Status: DC | PRN
  Filled 2021-05-08: qty 50, 7d supply, fill #0

## 2021-05-10 ENCOUNTER — Other Ambulatory Visit (HOSPITAL_COMMUNITY): Payer: Self-pay

## 2021-05-24 ENCOUNTER — Other Ambulatory Visit (HOSPITAL_COMMUNITY): Payer: Self-pay

## 2021-05-28 ENCOUNTER — Other Ambulatory Visit (HOSPITAL_COMMUNITY): Payer: Self-pay

## 2021-05-28 MED ORDER — SIMVASTATIN 20 MG PO TABS
20.0000 mg | ORAL_TABLET | Freq: Every evening | ORAL | 4 refills | Status: DC
  Filled 2021-05-28: qty 90, 90d supply, fill #0
  Filled 2021-05-29: qty 30, 30d supply, fill #0
  Filled 2021-07-31: qty 30, 30d supply, fill #1
  Filled 2021-09-13: qty 30, 30d supply, fill #2
  Filled 2021-10-10: qty 100, 100d supply, fill #3
  Filled 2022-02-26: qty 100, 100d supply, fill #4

## 2021-05-28 MED ORDER — DULOXETINE HCL 60 MG PO CPEP
60.0000 mg | ORAL_CAPSULE | Freq: Every day | ORAL | 4 refills | Status: DC
  Filled 2021-05-28: qty 90, 90d supply, fill #0
  Filled 2021-05-29: qty 30, 30d supply, fill #0
  Filled 2021-07-06: qty 30, 30d supply, fill #1
  Filled 2021-07-31: qty 30, 30d supply, fill #2
  Filled 2021-09-13: qty 30, 30d supply, fill #3
  Filled 2021-10-18: qty 30, 30d supply, fill #4
  Filled 2021-11-27: qty 30, 30d supply, fill #5
  Filled 2022-01-03: qty 30, 30d supply, fill #6
  Filled 2022-02-05: qty 30, 30d supply, fill #7
  Filled 2022-03-14: qty 30, 30d supply, fill #8
  Filled 2022-04-18: qty 30, 30d supply, fill #9

## 2021-05-29 ENCOUNTER — Other Ambulatory Visit (HOSPITAL_COMMUNITY): Payer: Self-pay

## 2021-06-04 ENCOUNTER — Other Ambulatory Visit (HOSPITAL_COMMUNITY): Payer: Self-pay

## 2021-06-05 ENCOUNTER — Other Ambulatory Visit (HOSPITAL_COMMUNITY): Payer: Self-pay

## 2021-06-05 MED ORDER — CYCLOBENZAPRINE HCL 10 MG PO TABS
10.0000 mg | ORAL_TABLET | Freq: Three times a day (TID) | ORAL | 2 refills | Status: DC | PRN
  Filled 2021-06-05 – 2021-06-19 (×2): qty 30, 10d supply, fill #0
  Filled 2021-08-10: qty 30, 10d supply, fill #1
  Filled 2021-09-21: qty 30, 10d supply, fill #2

## 2021-06-06 ENCOUNTER — Other Ambulatory Visit (HOSPITAL_COMMUNITY): Payer: Self-pay

## 2021-06-13 ENCOUNTER — Other Ambulatory Visit (HOSPITAL_COMMUNITY): Payer: Self-pay

## 2021-06-18 ENCOUNTER — Other Ambulatory Visit (HOSPITAL_COMMUNITY): Payer: Self-pay

## 2021-06-19 ENCOUNTER — Other Ambulatory Visit (HOSPITAL_COMMUNITY): Payer: Self-pay

## 2021-06-19 MED FILL — Losartan Potassium & Hydrochlorothiazide Tab 100-12.5 MG: ORAL | 30 days supply | Qty: 30 | Fill #5 | Status: AC

## 2021-07-06 ENCOUNTER — Other Ambulatory Visit (HOSPITAL_COMMUNITY): Payer: Self-pay

## 2021-07-06 MED ORDER — METOPROLOL SUCCINATE ER 25 MG PO TB24
25.0000 mg | ORAL_TABLET | Freq: Every day | ORAL | 3 refills | Status: DC
  Filled 2021-07-06: qty 90, 90d supply, fill #0
  Filled 2021-11-01: qty 90, 90d supply, fill #1
  Filled 2022-02-26: qty 90, 90d supply, fill #2
  Filled 2022-06-06: qty 90, 90d supply, fill #3

## 2021-07-10 ENCOUNTER — Other Ambulatory Visit (HOSPITAL_COMMUNITY): Payer: Self-pay

## 2021-07-10 DIAGNOSIS — M48062 Spinal stenosis, lumbar region with neurogenic claudication: Secondary | ICD-10-CM | POA: Diagnosis not present

## 2021-07-10 MED ORDER — HYDROCODONE-ACETAMINOPHEN 10-325 MG PO TABS
1.0000 | ORAL_TABLET | Freq: Three times a day (TID) | ORAL | 0 refills | Status: DC | PRN
  Filled 2021-07-10: qty 21, 7d supply, fill #0
  Filled 2021-07-18: qty 29, 10d supply, fill #1

## 2021-07-18 ENCOUNTER — Other Ambulatory Visit (HOSPITAL_COMMUNITY): Payer: Self-pay

## 2021-07-23 DIAGNOSIS — M79605 Pain in left leg: Secondary | ICD-10-CM | POA: Diagnosis not present

## 2021-07-30 DIAGNOSIS — E113313 Type 2 diabetes mellitus with moderate nonproliferative diabetic retinopathy with macular edema, bilateral: Secondary | ICD-10-CM | POA: Diagnosis not present

## 2021-07-30 DIAGNOSIS — H3582 Retinal ischemia: Secondary | ICD-10-CM | POA: Diagnosis not present

## 2021-07-30 DIAGNOSIS — H43813 Vitreous degeneration, bilateral: Secondary | ICD-10-CM | POA: Diagnosis not present

## 2021-07-30 DIAGNOSIS — H35033 Hypertensive retinopathy, bilateral: Secondary | ICD-10-CM | POA: Diagnosis not present

## 2021-07-31 ENCOUNTER — Other Ambulatory Visit (HOSPITAL_COMMUNITY): Payer: Self-pay

## 2021-07-31 MED FILL — Losartan Potassium & Hydrochlorothiazide Tab 100-12.5 MG: ORAL | 30 days supply | Qty: 30 | Fill #6 | Status: AC

## 2021-08-01 DIAGNOSIS — I700 Atherosclerosis of aorta: Secondary | ICD-10-CM | POA: Diagnosis not present

## 2021-08-01 DIAGNOSIS — E1140 Type 2 diabetes mellitus with diabetic neuropathy, unspecified: Secondary | ICD-10-CM | POA: Diagnosis not present

## 2021-08-01 DIAGNOSIS — E7800 Pure hypercholesterolemia, unspecified: Secondary | ICD-10-CM | POA: Diagnosis not present

## 2021-08-01 DIAGNOSIS — I5189 Other ill-defined heart diseases: Secondary | ICD-10-CM | POA: Diagnosis not present

## 2021-08-01 DIAGNOSIS — I10 Essential (primary) hypertension: Secondary | ICD-10-CM | POA: Diagnosis not present

## 2021-08-01 DIAGNOSIS — Z794 Long term (current) use of insulin: Secondary | ICD-10-CM | POA: Diagnosis not present

## 2021-08-01 DIAGNOSIS — M48061 Spinal stenosis, lumbar region without neurogenic claudication: Secondary | ICD-10-CM | POA: Diagnosis not present

## 2021-08-01 DIAGNOSIS — J439 Emphysema, unspecified: Secondary | ICD-10-CM | POA: Diagnosis not present

## 2021-08-02 ENCOUNTER — Other Ambulatory Visit (HOSPITAL_COMMUNITY): Payer: Self-pay

## 2021-08-02 MED ORDER — LANTUS SOLOSTAR 100 UNIT/ML ~~LOC~~ SOPN
20.0000 [IU] | PEN_INJECTOR | Freq: Every day | SUBCUTANEOUS | 2 refills | Status: DC
  Filled 2021-08-02: qty 3, 15d supply, fill #0
  Filled 2021-08-06 – 2021-08-20 (×2): qty 6, 30d supply, fill #0

## 2021-08-04 DIAGNOSIS — M25562 Pain in left knee: Secondary | ICD-10-CM | POA: Diagnosis not present

## 2021-08-06 ENCOUNTER — Other Ambulatory Visit (HOSPITAL_COMMUNITY): Payer: Self-pay

## 2021-08-06 MED ORDER — INSULIN PEN NEEDLE 31G X 5 MM MISC
0 refills | Status: AC
  Filled 2021-08-06: qty 100, 90d supply, fill #0
  Filled 2022-02-26: qty 100, 100d supply, fill #0

## 2021-08-06 MED ORDER — HYDROCODONE-ACETAMINOPHEN 10-325 MG PO TABS
1.0000 | ORAL_TABLET | Freq: Three times a day (TID) | ORAL | 0 refills | Status: DC | PRN
  Filled 2021-08-06: qty 50, 17d supply, fill #0

## 2021-08-09 DIAGNOSIS — M25562 Pain in left knee: Secondary | ICD-10-CM | POA: Diagnosis not present

## 2021-08-10 ENCOUNTER — Other Ambulatory Visit (HOSPITAL_COMMUNITY): Payer: Self-pay

## 2021-08-10 MED ORDER — GLIPIZIDE 5 MG PO TABS
5.0000 mg | ORAL_TABLET | Freq: Every day | ORAL | 3 refills | Status: DC | PRN
  Filled 2021-08-10: qty 90, 90d supply, fill #0
  Filled 2021-12-10: qty 90, 90d supply, fill #1
  Filled 2022-03-22: qty 90, 90d supply, fill #2
  Filled 2022-07-22: qty 90, 90d supply, fill #3

## 2021-08-16 DIAGNOSIS — E113313 Type 2 diabetes mellitus with moderate nonproliferative diabetic retinopathy with macular edema, bilateral: Secondary | ICD-10-CM | POA: Diagnosis not present

## 2021-08-20 ENCOUNTER — Other Ambulatory Visit (HOSPITAL_COMMUNITY): Payer: Self-pay

## 2021-08-23 ENCOUNTER — Other Ambulatory Visit (HOSPITAL_COMMUNITY): Payer: Self-pay

## 2021-08-27 ENCOUNTER — Other Ambulatory Visit (HOSPITAL_COMMUNITY): Payer: Self-pay

## 2021-08-28 ENCOUNTER — Other Ambulatory Visit (HOSPITAL_COMMUNITY): Payer: Self-pay

## 2021-08-28 MED ORDER — LANTUS SOLOSTAR 100 UNIT/ML ~~LOC~~ SOPN
20.0000 [IU] | PEN_INJECTOR | Freq: Every day | SUBCUTANEOUS | 2 refills | Status: DC
  Filled 2021-08-28: qty 3, 15d supply, fill #0

## 2021-09-05 ENCOUNTER — Other Ambulatory Visit (HOSPITAL_COMMUNITY): Payer: Self-pay

## 2021-09-07 ENCOUNTER — Other Ambulatory Visit (HOSPITAL_COMMUNITY): Payer: Self-pay

## 2021-09-07 MED ORDER — HYDROCODONE-ACETAMINOPHEN 10-325 MG PO TABS
1.0000 | ORAL_TABLET | Freq: Three times a day (TID) | ORAL | 0 refills | Status: DC | PRN
  Filled 2021-09-07: qty 35, 12d supply, fill #0
  Filled 2021-09-07: qty 15, 5d supply, fill #0

## 2021-09-10 ENCOUNTER — Other Ambulatory Visit (HOSPITAL_COMMUNITY): Payer: Self-pay

## 2021-09-13 ENCOUNTER — Other Ambulatory Visit (HOSPITAL_COMMUNITY): Payer: Self-pay

## 2021-09-13 ENCOUNTER — Other Ambulatory Visit: Payer: Self-pay

## 2021-09-13 MED ORDER — LOSARTAN POTASSIUM-HCTZ 100-12.5 MG PO TABS
1.0000 | ORAL_TABLET | Freq: Every day | ORAL | 12 refills | Status: DC
  Filled 2021-09-13: qty 30, 30d supply, fill #0
  Filled 2021-10-10: qty 100, 100d supply, fill #1
  Filled 2022-02-26: qty 100, 100d supply, fill #2
  Filled 2022-07-03: qty 30, 30d supply, fill #3
  Filled 2022-08-13: qty 30, 30d supply, fill #4

## 2021-09-18 ENCOUNTER — Other Ambulatory Visit (HOSPITAL_COMMUNITY): Payer: Self-pay

## 2021-09-21 ENCOUNTER — Other Ambulatory Visit (HOSPITAL_COMMUNITY): Payer: Self-pay

## 2021-09-27 ENCOUNTER — Other Ambulatory Visit (HOSPITAL_COMMUNITY): Payer: Self-pay

## 2021-10-01 ENCOUNTER — Other Ambulatory Visit (HOSPITAL_COMMUNITY): Payer: Self-pay

## 2021-10-01 DIAGNOSIS — Z8619 Personal history of other infectious and parasitic diseases: Secondary | ICD-10-CM | POA: Diagnosis not present

## 2021-10-01 DIAGNOSIS — I10 Essential (primary) hypertension: Secondary | ICD-10-CM | POA: Diagnosis not present

## 2021-10-01 DIAGNOSIS — J439 Emphysema, unspecified: Secondary | ICD-10-CM | POA: Diagnosis not present

## 2021-10-01 DIAGNOSIS — E7800 Pure hypercholesterolemia, unspecified: Secondary | ICD-10-CM | POA: Diagnosis not present

## 2021-10-01 DIAGNOSIS — E1140 Type 2 diabetes mellitus with diabetic neuropathy, unspecified: Secondary | ICD-10-CM | POA: Diagnosis not present

## 2021-10-01 DIAGNOSIS — I5189 Other ill-defined heart diseases: Secondary | ICD-10-CM | POA: Diagnosis not present

## 2021-10-01 DIAGNOSIS — Z23 Encounter for immunization: Secondary | ICD-10-CM | POA: Diagnosis not present

## 2021-10-01 DIAGNOSIS — Z0000 Encounter for general adult medical examination without abnormal findings: Secondary | ICD-10-CM | POA: Diagnosis not present

## 2021-10-01 DIAGNOSIS — I700 Atherosclerosis of aorta: Secondary | ICD-10-CM | POA: Diagnosis not present

## 2021-10-01 DIAGNOSIS — M48061 Spinal stenosis, lumbar region without neurogenic claudication: Secondary | ICD-10-CM | POA: Diagnosis not present

## 2021-10-01 MED ORDER — SILDENAFIL CITRATE 100 MG PO TABS
100.0000 mg | ORAL_TABLET | Freq: Every day | ORAL | 4 refills | Status: DC | PRN
  Filled 2021-10-01: qty 10, 10d supply, fill #0
  Filled 2022-01-03: qty 10, 10d supply, fill #1
  Filled 2022-02-26: qty 10, 10d supply, fill #2

## 2021-10-04 ENCOUNTER — Other Ambulatory Visit (HOSPITAL_COMMUNITY): Payer: Self-pay

## 2021-10-09 ENCOUNTER — Other Ambulatory Visit (HOSPITAL_COMMUNITY): Payer: Self-pay

## 2021-10-10 ENCOUNTER — Other Ambulatory Visit (HOSPITAL_COMMUNITY): Payer: Self-pay

## 2021-10-18 ENCOUNTER — Other Ambulatory Visit (HOSPITAL_COMMUNITY): Payer: Self-pay

## 2021-10-22 ENCOUNTER — Other Ambulatory Visit (HOSPITAL_COMMUNITY): Payer: Self-pay

## 2021-10-23 ENCOUNTER — Other Ambulatory Visit (HOSPITAL_COMMUNITY): Payer: Self-pay

## 2021-10-24 ENCOUNTER — Other Ambulatory Visit (HOSPITAL_COMMUNITY): Payer: Self-pay

## 2021-10-24 MED ORDER — HYDROCODONE-ACETAMINOPHEN 10-325 MG PO TABS
1.0000 | ORAL_TABLET | Freq: Two times a day (BID) | ORAL | 0 refills | Status: DC | PRN
  Filled 2021-10-24: qty 50, 25d supply, fill #0

## 2021-10-30 DIAGNOSIS — Z981 Arthrodesis status: Secondary | ICD-10-CM | POA: Diagnosis not present

## 2021-10-30 DIAGNOSIS — M4316 Spondylolisthesis, lumbar region: Secondary | ICD-10-CM | POA: Diagnosis not present

## 2021-11-01 ENCOUNTER — Other Ambulatory Visit (HOSPITAL_COMMUNITY): Payer: Self-pay

## 2021-11-01 MED ORDER — CYCLOBENZAPRINE HCL 10 MG PO TABS
10.0000 mg | ORAL_TABLET | Freq: Three times a day (TID) | ORAL | 2 refills | Status: DC | PRN
  Filled 2021-11-01: qty 30, 10d supply, fill #0
  Filled 2021-12-10: qty 30, 10d supply, fill #1
  Filled 2022-01-21: qty 30, 10d supply, fill #2

## 2021-11-02 ENCOUNTER — Other Ambulatory Visit (HOSPITAL_COMMUNITY): Payer: Self-pay

## 2021-11-09 IMAGING — RF DG C-ARM 1-60 MIN
1 series · 2 of 2 positions shown · non-contrast
Comparison: Same day radiograph

CLINICAL DATA: L4-L5 PLIF

EXAM:
DG C-ARM 1-60 MIN; LUMBAR SPINE - 2-3 VIEW
FLUOROSCOPY TIME:  Fluoroscopy Time:  14 seconds
Reported radiation: 9.83 mGy.

[Series 1: run · 2 of 2 slices shown]
[im 1/2]
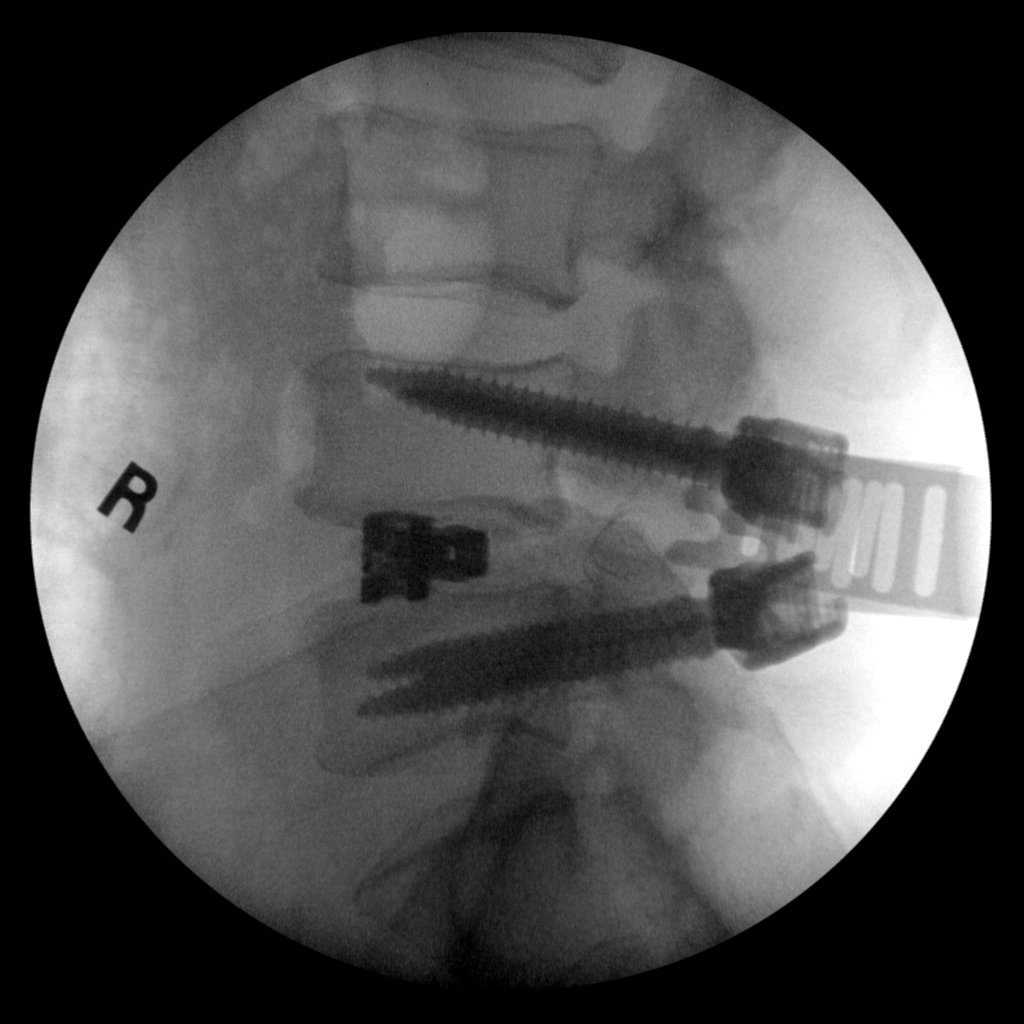
[im 2/2]
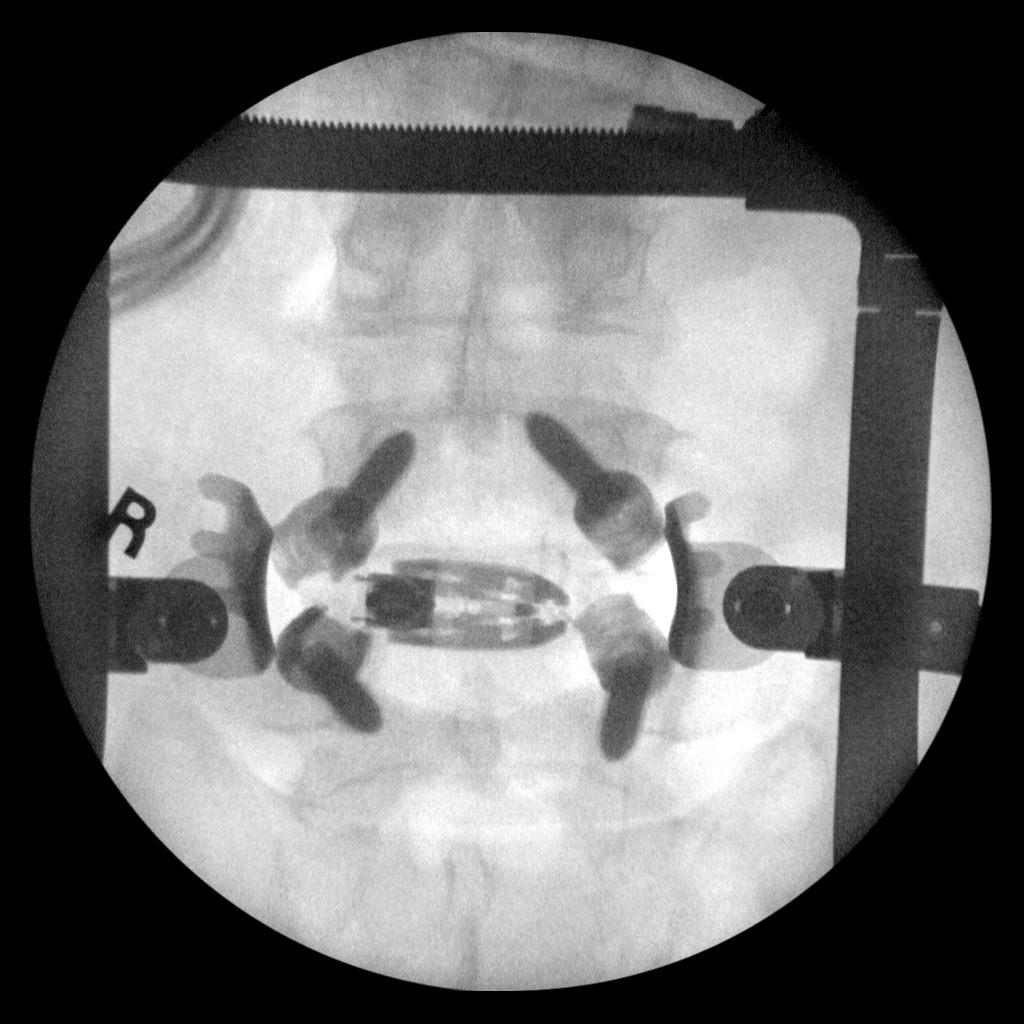

[2 of 2 positions shown; findings below may reference images not displayed]

FINDINGS: Two C-arm fluoroscopic images were obtained intraoperatively and
submitted for post operative interpretation. These images
demonstrate pedicle screws at L4 and L5 bilaterally as well as an
interbody spacer at L4-L5. No unexpected findings. Please see the
performing provider's procedural report for further detail.
IMPRESSION: Intraoperative fluoroscopic imaging, as detailed above.

## 2021-11-09 IMAGING — RF DG LUMBAR SPINE 2-3V
1 series · 2 of 2 positions shown · non-contrast
Comparison: Same day radiograph

CLINICAL DATA: L4-L5 PLIF

EXAM:
DG C-ARM 1-60 MIN; LUMBAR SPINE - 2-3 VIEW
FLUOROSCOPY TIME:  Fluoroscopy Time:  14 seconds
Reported radiation: 9.83 mGy.

[Series 1: run · 2 of 2 slices shown]
[im 1/2]
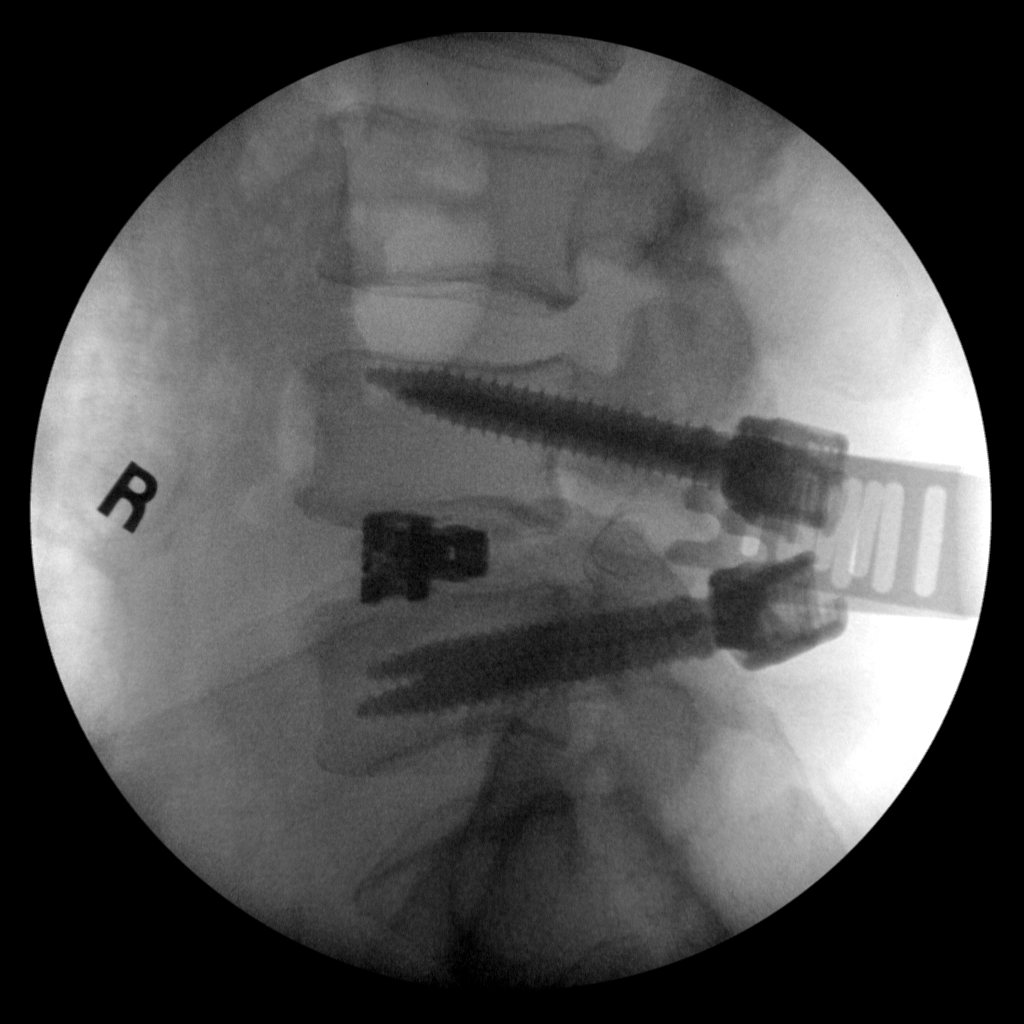
[im 2/2]
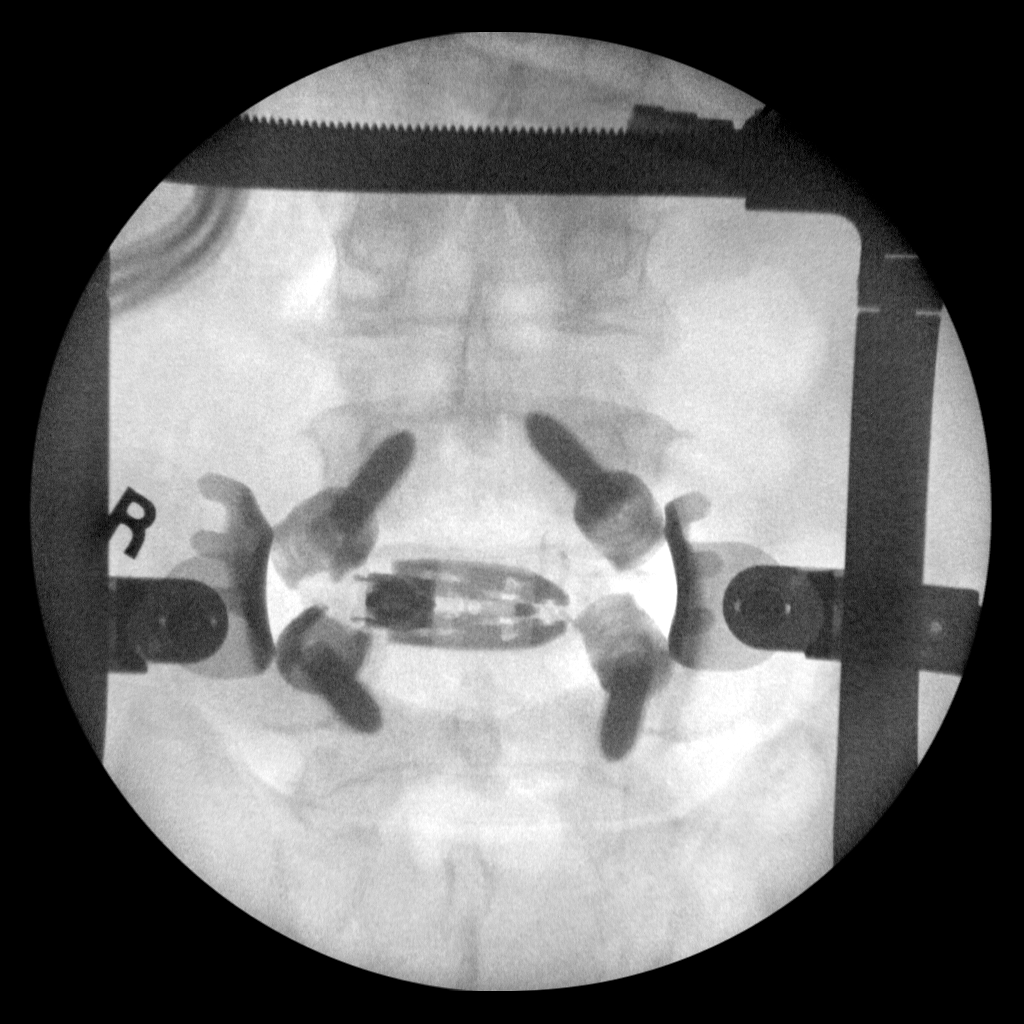

[2 of 2 positions shown; findings below may reference images not displayed]

FINDINGS: Two C-arm fluoroscopic images were obtained intraoperatively and
submitted for post operative interpretation. These images
demonstrate pedicle screws at L4 and L5 bilaterally as well as an
interbody spacer at L4-L5. No unexpected findings. Please see the
performing provider's procedural report for further detail.
IMPRESSION: Intraoperative fluoroscopic imaging, as detailed above.

## 2021-11-20 ENCOUNTER — Other Ambulatory Visit (HOSPITAL_COMMUNITY): Payer: Self-pay

## 2021-11-21 ENCOUNTER — Other Ambulatory Visit (HOSPITAL_COMMUNITY): Payer: Self-pay

## 2021-11-27 ENCOUNTER — Other Ambulatory Visit (HOSPITAL_COMMUNITY): Payer: Self-pay

## 2021-11-29 DIAGNOSIS — H3582 Retinal ischemia: Secondary | ICD-10-CM | POA: Diagnosis not present

## 2021-11-29 DIAGNOSIS — H35033 Hypertensive retinopathy, bilateral: Secondary | ICD-10-CM | POA: Diagnosis not present

## 2021-11-29 DIAGNOSIS — H43813 Vitreous degeneration, bilateral: Secondary | ICD-10-CM | POA: Diagnosis not present

## 2021-11-29 DIAGNOSIS — E113313 Type 2 diabetes mellitus with moderate nonproliferative diabetic retinopathy with macular edema, bilateral: Secondary | ICD-10-CM | POA: Diagnosis not present

## 2021-12-10 ENCOUNTER — Other Ambulatory Visit (HOSPITAL_COMMUNITY): Payer: Self-pay

## 2021-12-11 ENCOUNTER — Other Ambulatory Visit (HOSPITAL_COMMUNITY): Payer: Self-pay

## 2021-12-11 MED ORDER — HYDROCODONE-ACETAMINOPHEN 10-325 MG PO TABS
1.0000 | ORAL_TABLET | Freq: Two times a day (BID) | ORAL | 0 refills | Status: DC | PRN
  Filled 2021-12-11: qty 50, 25d supply, fill #0

## 2021-12-12 ENCOUNTER — Other Ambulatory Visit (HOSPITAL_COMMUNITY): Payer: Self-pay

## 2022-01-03 ENCOUNTER — Other Ambulatory Visit (HOSPITAL_COMMUNITY): Payer: Self-pay

## 2022-01-03 DIAGNOSIS — E113312 Type 2 diabetes mellitus with moderate nonproliferative diabetic retinopathy with macular edema, left eye: Secondary | ICD-10-CM | POA: Diagnosis not present

## 2022-01-21 ENCOUNTER — Other Ambulatory Visit (HOSPITAL_COMMUNITY): Payer: Self-pay

## 2022-01-21 MED ORDER — HYDROCODONE-ACETAMINOPHEN 10-325 MG PO TABS
1.0000 | ORAL_TABLET | Freq: Two times a day (BID) | ORAL | 0 refills | Status: DC | PRN
  Filled 2022-01-21: qty 50, 25d supply, fill #0

## 2022-01-22 ENCOUNTER — Other Ambulatory Visit (HOSPITAL_COMMUNITY): Payer: Self-pay

## 2022-02-05 ENCOUNTER — Other Ambulatory Visit (HOSPITAL_COMMUNITY): Payer: Self-pay

## 2022-02-05 MED ORDER — GABAPENTIN 300 MG PO CAPS
300.0000 mg | ORAL_CAPSULE | Freq: Four times a day (QID) | ORAL | 12 refills | Status: DC
  Filled 2022-02-05: qty 120, 30d supply, fill #0
  Filled 2022-07-03: qty 120, 30d supply, fill #1
  Filled 2022-11-20: qty 120, 30d supply, fill #2

## 2022-02-08 DIAGNOSIS — M25511 Pain in right shoulder: Secondary | ICD-10-CM | POA: Diagnosis not present

## 2022-02-12 DIAGNOSIS — M7541 Impingement syndrome of right shoulder: Secondary | ICD-10-CM | POA: Diagnosis not present

## 2022-02-12 DIAGNOSIS — M6281 Muscle weakness (generalized): Secondary | ICD-10-CM | POA: Diagnosis not present

## 2022-02-12 DIAGNOSIS — M25611 Stiffness of right shoulder, not elsewhere classified: Secondary | ICD-10-CM | POA: Diagnosis not present

## 2022-02-15 DIAGNOSIS — M25611 Stiffness of right shoulder, not elsewhere classified: Secondary | ICD-10-CM | POA: Diagnosis not present

## 2022-02-15 DIAGNOSIS — M6281 Muscle weakness (generalized): Secondary | ICD-10-CM | POA: Diagnosis not present

## 2022-02-15 DIAGNOSIS — M7541 Impingement syndrome of right shoulder: Secondary | ICD-10-CM | POA: Diagnosis not present

## 2022-02-22 DIAGNOSIS — M25611 Stiffness of right shoulder, not elsewhere classified: Secondary | ICD-10-CM | POA: Diagnosis not present

## 2022-02-22 DIAGNOSIS — M6281 Muscle weakness (generalized): Secondary | ICD-10-CM | POA: Diagnosis not present

## 2022-02-22 DIAGNOSIS — M7541 Impingement syndrome of right shoulder: Secondary | ICD-10-CM | POA: Diagnosis not present

## 2022-02-26 ENCOUNTER — Other Ambulatory Visit (HOSPITAL_COMMUNITY): Payer: Self-pay

## 2022-02-26 MED ORDER — CYCLOBENZAPRINE HCL 10 MG PO TABS
10.0000 mg | ORAL_TABLET | Freq: Three times a day (TID) | ORAL | 2 refills | Status: DC | PRN
  Filled 2022-02-26: qty 30, 10d supply, fill #0
  Filled 2022-04-08: qty 30, 10d supply, fill #1
  Filled 2022-05-17: qty 30, 10d supply, fill #2

## 2022-03-06 DIAGNOSIS — M6281 Muscle weakness (generalized): Secondary | ICD-10-CM | POA: Diagnosis not present

## 2022-03-06 DIAGNOSIS — M25611 Stiffness of right shoulder, not elsewhere classified: Secondary | ICD-10-CM | POA: Diagnosis not present

## 2022-03-06 DIAGNOSIS — M7541 Impingement syndrome of right shoulder: Secondary | ICD-10-CM | POA: Diagnosis not present

## 2022-03-14 ENCOUNTER — Other Ambulatory Visit (HOSPITAL_COMMUNITY): Payer: Self-pay

## 2022-03-15 ENCOUNTER — Other Ambulatory Visit (HOSPITAL_COMMUNITY): Payer: Self-pay

## 2022-03-15 MED ORDER — HYDROCODONE-ACETAMINOPHEN 10-325 MG PO TABS
1.0000 | ORAL_TABLET | Freq: Two times a day (BID) | ORAL | 0 refills | Status: DC | PRN
  Filled 2022-03-15: qty 50, 25d supply, fill #0

## 2022-03-21 DIAGNOSIS — J439 Emphysema, unspecified: Secondary | ICD-10-CM | POA: Diagnosis not present

## 2022-03-21 DIAGNOSIS — E1140 Type 2 diabetes mellitus with diabetic neuropathy, unspecified: Secondary | ICD-10-CM | POA: Diagnosis not present

## 2022-03-21 DIAGNOSIS — E113313 Type 2 diabetes mellitus with moderate nonproliferative diabetic retinopathy with macular edema, bilateral: Secondary | ICD-10-CM | POA: Diagnosis not present

## 2022-03-21 DIAGNOSIS — E782 Mixed hyperlipidemia: Secondary | ICD-10-CM | POA: Diagnosis not present

## 2022-03-21 DIAGNOSIS — M25562 Pain in left knee: Secondary | ICD-10-CM | POA: Diagnosis not present

## 2022-03-21 DIAGNOSIS — I10 Essential (primary) hypertension: Secondary | ICD-10-CM | POA: Diagnosis not present

## 2022-03-21 DIAGNOSIS — M48061 Spinal stenosis, lumbar region without neurogenic claudication: Secondary | ICD-10-CM | POA: Diagnosis not present

## 2022-03-21 DIAGNOSIS — I700 Atherosclerosis of aorta: Secondary | ICD-10-CM | POA: Diagnosis not present

## 2022-03-22 ENCOUNTER — Other Ambulatory Visit (HOSPITAL_COMMUNITY): Payer: Self-pay

## 2022-03-22 DIAGNOSIS — M25562 Pain in left knee: Secondary | ICD-10-CM | POA: Diagnosis not present

## 2022-03-28 DIAGNOSIS — M25511 Pain in right shoulder: Secondary | ICD-10-CM | POA: Diagnosis not present

## 2022-04-01 DIAGNOSIS — M7521 Bicipital tendinitis, right shoulder: Secondary | ICD-10-CM | POA: Diagnosis not present

## 2022-04-04 DIAGNOSIS — H43392 Other vitreous opacities, left eye: Secondary | ICD-10-CM | POA: Diagnosis not present

## 2022-04-04 DIAGNOSIS — H43813 Vitreous degeneration, bilateral: Secondary | ICD-10-CM | POA: Diagnosis not present

## 2022-04-04 DIAGNOSIS — E113313 Type 2 diabetes mellitus with moderate nonproliferative diabetic retinopathy with macular edema, bilateral: Secondary | ICD-10-CM | POA: Diagnosis not present

## 2022-04-04 DIAGNOSIS — H43821 Vitreomacular adhesion, right eye: Secondary | ICD-10-CM | POA: Diagnosis not present

## 2022-04-04 DIAGNOSIS — H3582 Retinal ischemia: Secondary | ICD-10-CM | POA: Diagnosis not present

## 2022-04-09 ENCOUNTER — Other Ambulatory Visit (HOSPITAL_COMMUNITY): Payer: Self-pay

## 2022-04-18 ENCOUNTER — Other Ambulatory Visit (HOSPITAL_COMMUNITY): Payer: Self-pay

## 2022-05-06 ENCOUNTER — Other Ambulatory Visit (HOSPITAL_COMMUNITY): Payer: Self-pay

## 2022-05-06 MED ORDER — HYDROCODONE-ACETAMINOPHEN 10-325 MG PO TABS
1.0000 | ORAL_TABLET | Freq: Two times a day (BID) | ORAL | 0 refills | Status: DC | PRN
  Filled 2022-05-06: qty 14, 7d supply, fill #0
  Filled 2022-05-17: qty 36, 18d supply, fill #1

## 2022-05-09 ENCOUNTER — Other Ambulatory Visit (HOSPITAL_COMMUNITY): Payer: Self-pay

## 2022-05-17 ENCOUNTER — Other Ambulatory Visit (HOSPITAL_COMMUNITY): Payer: Self-pay

## 2022-05-21 DIAGNOSIS — M5412 Radiculopathy, cervical region: Secondary | ICD-10-CM | POA: Diagnosis not present

## 2022-05-21 DIAGNOSIS — M542 Cervicalgia: Secondary | ICD-10-CM | POA: Diagnosis not present

## 2022-05-21 DIAGNOSIS — M4316 Spondylolisthesis, lumbar region: Secondary | ICD-10-CM | POA: Diagnosis not present

## 2022-05-21 DIAGNOSIS — Z6827 Body mass index (BMI) 27.0-27.9, adult: Secondary | ICD-10-CM | POA: Diagnosis not present

## 2022-05-29 ENCOUNTER — Other Ambulatory Visit (HOSPITAL_COMMUNITY): Payer: Self-pay

## 2022-05-30 ENCOUNTER — Other Ambulatory Visit (HOSPITAL_COMMUNITY): Payer: Self-pay

## 2022-05-30 MED ORDER — DULOXETINE HCL 60 MG PO CPEP
60.0000 mg | ORAL_CAPSULE | Freq: Every day | ORAL | 4 refills | Status: DC
  Filled 2022-05-30: qty 90, 90d supply, fill #0
  Filled 2022-09-18: qty 90, 90d supply, fill #1
  Filled 2022-12-13: qty 90, 90d supply, fill #2
  Filled 2023-04-01: qty 90, 90d supply, fill #3

## 2022-06-04 DIAGNOSIS — Z7689 Persons encountering health services in other specified circumstances: Secondary | ICD-10-CM | POA: Diagnosis not present

## 2022-06-04 DIAGNOSIS — M5412 Radiculopathy, cervical region: Secondary | ICD-10-CM | POA: Diagnosis not present

## 2022-06-06 ENCOUNTER — Other Ambulatory Visit (HOSPITAL_COMMUNITY): Payer: Self-pay

## 2022-06-07 ENCOUNTER — Other Ambulatory Visit (HOSPITAL_COMMUNITY): Payer: Self-pay

## 2022-06-12 ENCOUNTER — Other Ambulatory Visit (HOSPITAL_COMMUNITY): Payer: Self-pay

## 2022-06-18 ENCOUNTER — Other Ambulatory Visit (HOSPITAL_COMMUNITY): Payer: Self-pay

## 2022-06-19 ENCOUNTER — Other Ambulatory Visit (HOSPITAL_COMMUNITY): Payer: Self-pay

## 2022-07-03 ENCOUNTER — Other Ambulatory Visit (HOSPITAL_COMMUNITY): Payer: Self-pay

## 2022-07-03 MED ORDER — CYCLOBENZAPRINE HCL 10 MG PO TABS
10.0000 mg | ORAL_TABLET | Freq: Three times a day (TID) | ORAL | 2 refills | Status: DC | PRN
  Filled 2022-07-03: qty 30, 10d supply, fill #0
  Filled 2022-08-13: qty 30, 10d supply, fill #1
  Filled 2022-09-18: qty 30, 10d supply, fill #2

## 2022-07-03 MED ORDER — SIMVASTATIN 20 MG PO TABS
20.0000 mg | ORAL_TABLET | Freq: Every evening | ORAL | 4 refills | Status: DC
  Filled 2022-07-03: qty 90, 90d supply, fill #0
  Filled 2022-11-11 – 2023-01-28 (×2): qty 90, 90d supply, fill #1
  Filled 2023-05-12: qty 90, 90d supply, fill #2

## 2022-07-04 ENCOUNTER — Other Ambulatory Visit (HOSPITAL_COMMUNITY): Payer: Self-pay

## 2022-07-05 ENCOUNTER — Other Ambulatory Visit (HOSPITAL_COMMUNITY): Payer: Self-pay

## 2022-07-08 ENCOUNTER — Observation Stay (HOSPITAL_COMMUNITY)
Admission: EM | Admit: 2022-07-08 | Discharge: 2022-07-09 | Disposition: A | Discharge status: Home / Self Care | Payer: Medicare HMO | Attending: Internal Medicine | Admitting: Internal Medicine

## 2022-07-08 ENCOUNTER — Encounter (HOSPITAL_COMMUNITY): Payer: Self-pay | Admitting: Internal Medicine

## 2022-07-08 ENCOUNTER — Emergency Department (HOSPITAL_COMMUNITY): Payer: Medicare HMO

## 2022-07-08 ENCOUNTER — Other Ambulatory Visit: Payer: Self-pay

## 2022-07-08 DIAGNOSIS — M4312 Spondylolisthesis, cervical region: Secondary | ICD-10-CM | POA: Diagnosis not present

## 2022-07-08 DIAGNOSIS — R000 Tachycardia, unspecified: Secondary | ICD-10-CM | POA: Diagnosis not present

## 2022-07-08 DIAGNOSIS — D649 Anemia, unspecified: Secondary | ICD-10-CM | POA: Diagnosis not present

## 2022-07-08 DIAGNOSIS — Z7982 Long term (current) use of aspirin: Secondary | ICD-10-CM | POA: Insufficient documentation

## 2022-07-08 DIAGNOSIS — R55 Syncope and collapse: Principal | ICD-10-CM | POA: Diagnosis present

## 2022-07-08 DIAGNOSIS — E1151 Type 2 diabetes mellitus with diabetic peripheral angiopathy without gangrene: Secondary | ICD-10-CM | POA: Diagnosis not present

## 2022-07-08 DIAGNOSIS — Z794 Long term (current) use of insulin: Secondary | ICD-10-CM | POA: Diagnosis not present

## 2022-07-08 DIAGNOSIS — I10 Essential (primary) hypertension: Secondary | ICD-10-CM | POA: Diagnosis present

## 2022-07-08 DIAGNOSIS — M545 Low back pain, unspecified: Secondary | ICD-10-CM | POA: Insufficient documentation

## 2022-07-08 DIAGNOSIS — M25519 Pain in unspecified shoulder: Secondary | ICD-10-CM | POA: Diagnosis not present

## 2022-07-08 DIAGNOSIS — Z1810 Retained metal fragments, unspecified: Secondary | ICD-10-CM | POA: Diagnosis not present

## 2022-07-08 DIAGNOSIS — R42 Dizziness and giddiness: Secondary | ICD-10-CM | POA: Insufficient documentation

## 2022-07-08 DIAGNOSIS — S80252A Superficial foreign body, left knee, initial encounter: Secondary | ICD-10-CM | POA: Diagnosis not present

## 2022-07-08 DIAGNOSIS — S199XXA Unspecified injury of neck, initial encounter: Secondary | ICD-10-CM | POA: Diagnosis not present

## 2022-07-08 DIAGNOSIS — E663 Overweight: Secondary | ICD-10-CM | POA: Diagnosis present

## 2022-07-08 DIAGNOSIS — I959 Hypotension, unspecified: Secondary | ICD-10-CM | POA: Diagnosis not present

## 2022-07-08 DIAGNOSIS — Z7984 Long term (current) use of oral hypoglycemic drugs: Secondary | ICD-10-CM | POA: Diagnosis not present

## 2022-07-08 DIAGNOSIS — Z8616 Personal history of COVID-19: Secondary | ICD-10-CM | POA: Insufficient documentation

## 2022-07-08 DIAGNOSIS — Z981 Arthrodesis status: Secondary | ICD-10-CM | POA: Diagnosis not present

## 2022-07-08 DIAGNOSIS — R079 Chest pain, unspecified: Secondary | ICD-10-CM | POA: Diagnosis not present

## 2022-07-08 DIAGNOSIS — Y92410 Unspecified street and highway as the place of occurrence of the external cause: Secondary | ICD-10-CM | POA: Diagnosis not present

## 2022-07-08 DIAGNOSIS — S0990XA Unspecified injury of head, initial encounter: Secondary | ICD-10-CM | POA: Diagnosis not present

## 2022-07-08 DIAGNOSIS — Z79899 Other long term (current) drug therapy: Secondary | ICD-10-CM | POA: Insufficient documentation

## 2022-07-08 DIAGNOSIS — Z87891 Personal history of nicotine dependence: Secondary | ICD-10-CM | POA: Diagnosis not present

## 2022-07-08 DIAGNOSIS — M25511 Pain in right shoulder: Secondary | ICD-10-CM | POA: Diagnosis not present

## 2022-07-08 DIAGNOSIS — M25512 Pain in left shoulder: Secondary | ICD-10-CM | POA: Diagnosis not present

## 2022-07-08 DIAGNOSIS — T68XXXA Hypothermia, initial encounter: Secondary | ICD-10-CM | POA: Diagnosis not present

## 2022-07-08 DIAGNOSIS — E785 Hyperlipidemia, unspecified: Secondary | ICD-10-CM | POA: Diagnosis present

## 2022-07-08 DIAGNOSIS — M47812 Spondylosis without myelopathy or radiculopathy, cervical region: Secondary | ICD-10-CM | POA: Diagnosis not present

## 2022-07-08 DIAGNOSIS — E119 Type 2 diabetes mellitus without complications: Secondary | ICD-10-CM | POA: Diagnosis not present

## 2022-07-08 DIAGNOSIS — E1142 Type 2 diabetes mellitus with diabetic polyneuropathy: Secondary | ICD-10-CM | POA: Diagnosis present

## 2022-07-08 LAB — COMPREHENSIVE METABOLIC PANEL
ALT: 19 U/L (ref 0–44)
AST: 40 U/L (ref 15–41)
Albumin: 3.5 g/dL (ref 3.5–5.0)
Alkaline Phosphatase: 75 U/L (ref 38–126)
Anion gap: 11 (ref 5–15)
BUN: 16 mg/dL (ref 8–23)
CO2: 22 mmol/L (ref 22–32)
Calcium: 9.1 mg/dL (ref 8.9–10.3)
Chloride: 105 mmol/L (ref 98–111)
Creatinine, Ser: 0.9 mg/dL (ref 0.61–1.24)
GFR, Estimated: >60 mL/min (ref >60)
Glucose, Bld: 297 mg/dL — ABNORMAL HIGH (ref 70–99)
Potassium: 4.4 mmol/L (ref 3.5–5.1)
Sodium: 138 mmol/L (ref 135–145)
Total Bilirubin: 0.5 mg/dL (ref 0.3–1.2)
Total Protein: 7.5 g/dL (ref 6.5–8.1)

## 2022-07-08 LAB — PROTIME-INR
INR: 1 (ref 0.8–1.2)
Prothrombin Time: 12.7 seconds (ref 11.4–15.2)

## 2022-07-08 LAB — CBC WITH DIFFERENTIAL/PLATELET
Abs Immature Granulocytes: 0.04 10*3/uL (ref 0.00–0.07)
Basophils Absolute: 0 10*3/uL (ref 0.0–0.1)
Basophils Relative: 1 %
Eosinophils Absolute: 1.4 10*3/uL — ABNORMAL HIGH (ref 0.0–0.5)
Eosinophils Relative: 18 %
HCT: 33.3 % — ABNORMAL LOW (ref 39.0–52.0)
Hemoglobin: 10.3 g/dL — ABNORMAL LOW (ref 13.0–17.0)
Immature Granulocytes: 1 %
Lymphocytes Relative: 19 %
Lymphs Abs: 1.5 10*3/uL (ref 0.7–4.0)
MCH: 28.5 pg (ref 26.0–34.0)
MCHC: 30.9 g/dL (ref 30.0–36.0)
MCV: 92.2 fL (ref 80.0–100.0)
Monocytes Absolute: 0.5 10*3/uL (ref 0.1–1.0)
Monocytes Relative: 7 %
Neutro Abs: 4.2 10*3/uL (ref 1.7–7.7)
Neutrophils Relative %: 54 %
Platelets: 309 10*3/uL (ref 150–400)
RBC: 3.61 MIL/uL — ABNORMAL LOW (ref 4.22–5.81)
RDW: 12.2 % (ref 11.5–15.5)
WBC: 7.8 10*3/uL (ref 4.0–10.5)
nRBC: 0 % (ref 0.0–0.2)

## 2022-07-08 LAB — URINALYSIS, ROUTINE W REFLEX MICROSCOPIC
Bacteria, UA: NONE SEEN
Bilirubin Urine: NEGATIVE
Glucose, UA: >=500 mg/dL — AB
Hgb urine dipstick: NEGATIVE
Ketones, ur: 5 mg/dL — AB
Leukocytes,Ua: NEGATIVE
Nitrite: NEGATIVE
Protein, ur: 100 mg/dL — AB
Specific Gravity, Urine: 1.027 (ref 1.005–1.030)
pH: 5 (ref 5.0–8.0)

## 2022-07-08 LAB — SAMPLE TO BLOOD BANK

## 2022-07-08 LAB — BLOOD GAS, VENOUS
Acid-base deficit: 0.3 mmol/L (ref 0.0–2.0)
Bicarbonate: 26.3 mmol/L (ref 20.0–28.0)
O2 Saturation: 39.8 %
Patient temperature: 37
pCO2, Ven: 51 mmHg (ref 44–60)
pH, Ven: 7.32 (ref 7.25–7.43)
pO2, Ven: <31 mmHg — CL (ref 32–45)

## 2022-07-08 LAB — LACTIC ACID, PLASMA: Lactic Acid, Venous: 2.3 mmol/L (ref 0.5–1.9)

## 2022-07-08 LAB — BETA-HYDROXYBUTYRIC ACID: Beta-Hydroxybutyric Acid: 0.2 mmol/L (ref 0.05–0.27)

## 2022-07-08 LAB — CBG MONITORING, ED
Glucose-Capillary: 301 mg/dL — ABNORMAL HIGH (ref 70–99)
Glucose-Capillary: 258 mg/dL — ABNORMAL HIGH (ref 70–99)

## 2022-07-08 LAB — GLUCOSE, CAPILLARY
Glucose-Capillary: 213 mg/dL — ABNORMAL HIGH (ref 70–99)
Glucose-Capillary: 265 mg/dL — ABNORMAL HIGH (ref 70–99)

## 2022-07-08 LAB — ETHANOL: Alcohol, Ethyl (B): <10 mg/dL (ref <10)

## 2022-07-08 MED ORDER — ACETAMINOPHEN 325 MG PO TABS
650.0000 mg | ORAL_TABLET | Freq: Once | ORAL | Status: DC
  Filled 2022-07-08: qty 2

## 2022-07-08 MED ORDER — SIMVASTATIN 20 MG PO TABS
20.0000 mg | ORAL_TABLET | Freq: Every evening | ORAL | Status: DC
  Administered 2022-07-08: 20 mg via ORAL
  Filled 2022-07-08: qty 1

## 2022-07-08 MED ORDER — METOPROLOL SUCCINATE ER 25 MG PO TB24
25.0000 mg | ORAL_TABLET | Freq: Every day | ORAL | Status: DC
  Administered 2022-07-08 – 2022-07-09 (×2): 25 mg via ORAL
  Filled 2022-07-08 (×2): qty 1

## 2022-07-08 MED ORDER — KETOROLAC TROMETHAMINE 15 MG/ML IJ SOLN
15.0000 mg | Freq: Once | INTRAMUSCULAR | Status: AC
  Administered 2022-07-08: 15 mg via INTRAVENOUS
  Filled 2022-07-08: qty 1

## 2022-07-08 MED ORDER — HYDROCODONE-ACETAMINOPHEN 10-325 MG PO TABS
1.0000 | ORAL_TABLET | Freq: Two times a day (BID) | ORAL | Status: DC | PRN
  Administered 2022-07-08 – 2022-07-09 (×2): 1 via ORAL
  Filled 2022-07-08 (×3): qty 1

## 2022-07-08 MED ORDER — GLIPIZIDE 5 MG PO TABS: 5.0000 mg | ORAL_TABLET | Freq: Every day | ORAL | Status: DC | PRN

## 2022-07-08 MED ORDER — SODIUM CHLORIDE 0.9% FLUSH
3.0000 mL | Freq: Two times a day (BID) | INTRAVENOUS | Status: DC
  Administered 2022-07-08 – 2022-07-09 (×3): 3 mL via INTRAVENOUS

## 2022-07-08 MED ORDER — ONDANSETRON HCL 4 MG PO TABS: 4.0000 mg | ORAL_TABLET | Freq: Four times a day (QID) | ORAL | Status: DC | PRN

## 2022-07-08 MED ORDER — DULOXETINE HCL 30 MG PO CPEP
60.0000 mg | ORAL_CAPSULE | Freq: Every day | ORAL | Status: DC
  Administered 2022-07-09: 60 mg via ORAL
  Filled 2022-07-08: qty 2

## 2022-07-08 MED ORDER — INSULIN GLARGINE-YFGN 100 UNIT/ML ~~LOC~~ SOLN
10.0000 [IU] | Freq: Every day | SUBCUTANEOUS | Status: DC
  Administered 2022-07-08: 10 [IU] via SUBCUTANEOUS
  Filled 2022-07-08 (×2): qty 0.1

## 2022-07-08 MED ORDER — INSULIN ASPART 100 UNIT/ML IJ SOLN
0.0000 [IU] | Freq: Three times a day (TID) | INTRAMUSCULAR | Status: DC
  Administered 2022-07-08: 3 [IU] via SUBCUTANEOUS
  Administered 2022-07-09: 2 [IU] via SUBCUTANEOUS
  Administered 2022-07-09: 3 [IU] via SUBCUTANEOUS
  Filled 2022-07-08: qty 0.09

## 2022-07-08 MED ORDER — ASPIRIN 81 MG PO TBEC
81.0000 mg | DELAYED_RELEASE_TABLET | Freq: Every day | ORAL | Status: DC
  Administered 2022-07-09: 81 mg via ORAL
  Filled 2022-07-08: qty 1

## 2022-07-08 MED ORDER — LOSARTAN POTASSIUM-HCTZ 100-12.5 MG PO TABS: 1.0000 | ORAL_TABLET | Freq: Every day | ORAL | Status: DC

## 2022-07-08 MED ORDER — METFORMIN HCL 500 MG PO TABS
1000.0000 mg | ORAL_TABLET | Freq: Two times a day (BID) | ORAL | Status: DC
  Administered 2022-07-09: 1000 mg via ORAL
  Filled 2022-07-08: qty 2

## 2022-07-08 MED ORDER — GABAPENTIN 300 MG PO CAPS
300.0000 mg | ORAL_CAPSULE | Freq: Three times a day (TID) | ORAL | Status: DC
  Administered 2022-07-08 – 2022-07-09 (×3): 300 mg via ORAL
  Filled 2022-07-08 (×3): qty 1

## 2022-07-08 MED ORDER — LOSARTAN POTASSIUM 50 MG PO TABS
100.0000 mg | ORAL_TABLET | Freq: Every day | ORAL | Status: DC
  Administered 2022-07-08 – 2022-07-09 (×2): 100 mg via ORAL
  Filled 2022-07-08 (×2): qty 2

## 2022-07-08 MED ORDER — METOPROLOL TARTRATE 5 MG/5ML IV SOLN
5.0000 mg | Freq: Once | INTRAVENOUS | Status: AC
  Administered 2022-07-08: 5 mg via INTRAVENOUS
  Filled 2022-07-08: qty 5

## 2022-07-08 MED ORDER — HYDRALAZINE HCL 20 MG/ML IJ SOLN
10.0000 mg | INTRAMUSCULAR | Status: DC | PRN
  Filled 2022-07-08: qty 1

## 2022-07-08 MED ORDER — ONDANSETRON HCL 4 MG/2ML IJ SOLN: 4.0000 mg | Freq: Four times a day (QID) | INTRAMUSCULAR | Status: DC | PRN

## 2022-07-08 MED ORDER — ACETAMINOPHEN 325 MG PO TABS
650.0000 mg | ORAL_TABLET | Freq: Four times a day (QID) | ORAL | Status: DC | PRN
  Administered 2022-07-08 – 2022-07-09 (×2): 650 mg via ORAL
  Filled 2022-07-08 (×2): qty 2

## 2022-07-08 MED ORDER — LACTATED RINGERS IV BOLUS
1000.0000 mL | Freq: Once | INTRAVENOUS | Status: AC
  Administered 2022-07-08: 1000 mL via INTRAVENOUS

## 2022-07-08 MED ORDER — ACETAMINOPHEN 650 MG RE SUPP: 650.0000 mg | Freq: Four times a day (QID) | RECTAL | Status: DC | PRN

## 2022-07-08 MED ORDER — HYDROCODONE-ACETAMINOPHEN 5-325 MG PO TABS
1.0000 | ORAL_TABLET | Freq: Once | ORAL | Status: AC
  Administered 2022-07-08: 1 via ORAL
  Filled 2022-07-08: qty 1

## 2022-07-08 MED ORDER — HYDROCHLOROTHIAZIDE 12.5 MG PO TABS
12.5000 mg | ORAL_TABLET | Freq: Every day | ORAL | Status: DC
  Administered 2022-07-08 – 2022-07-09 (×2): 12.5 mg via ORAL
  Filled 2022-07-08 (×2): qty 1

## 2022-07-08 NOTE — ED Notes (Signed)
Patient transported to CT 
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
## 2022-07-09 ENCOUNTER — Observation Stay (HOSPITAL_COMMUNITY): Payer: Medicare HMO

## 2022-07-09 ENCOUNTER — Other Ambulatory Visit (HOSPITAL_COMMUNITY): Payer: Self-pay

## 2022-07-09 ENCOUNTER — Observation Stay (HOSPITAL_BASED_OUTPATIENT_CLINIC_OR_DEPARTMENT_OTHER): Payer: Medicare HMO

## 2022-07-09 DIAGNOSIS — R55 Syncope and collapse: Secondary | ICD-10-CM

## 2022-07-09 DIAGNOSIS — M79672 Pain in left foot: Secondary | ICD-10-CM | POA: Diagnosis not present

## 2022-07-09 LAB — CBC
HCT: 28 % — ABNORMAL LOW (ref 39.0–52.0)
Hemoglobin: 8.9 g/dL — ABNORMAL LOW (ref 13.0–17.0)
MCH: 28.8 pg (ref 26.0–34.0)
MCHC: 31.8 g/dL (ref 30.0–36.0)
MCV: 90.6 fL (ref 80.0–100.0)
Platelets: 289 10*3/uL (ref 150–400)
RBC: 3.09 MIL/uL — ABNORMAL LOW (ref 4.22–5.81)
RDW: 12.5 % (ref 11.5–15.5)
WBC: 9.1 10*3/uL (ref 4.0–10.5)
nRBC: 0 % (ref 0.0–0.2)

## 2022-07-09 LAB — ECHOCARDIOGRAM COMPLETE
Area-P 1/2: 2.62 cm2
Calc EF: 49 %
Height: 71 in
S' Lateral: 2.9 cm
Single Plane A2C EF: 39.1 %
Single Plane A4C EF: 57.7 %
Weight: 3231.06 oz

## 2022-07-09 LAB — BASIC METABOLIC PANEL
Anion gap: 9 (ref 5–15)
BUN: 19 mg/dL (ref 8–23)
CO2: 22 mmol/L (ref 22–32)
Calcium: 8.9 mg/dL (ref 8.9–10.3)
Chloride: 104 mmol/L (ref 98–111)
Creatinine, Ser: 0.87 mg/dL (ref 0.61–1.24)
GFR, Estimated: >60 mL/min (ref >60)
Glucose, Bld: 243 mg/dL — ABNORMAL HIGH (ref 70–99)
Potassium: 3.6 mmol/L (ref 3.5–5.1)
Sodium: 135 mmol/L (ref 135–145)

## 2022-07-09 LAB — HIV ANTIBODY (ROUTINE TESTING W REFLEX): HIV Screen 4th Generation wRfx: NONREACTIVE

## 2022-07-09 LAB — HEMOGLOBIN A1C
Hgb A1c MFr Bld: 9.6 % — ABNORMAL HIGH (ref 4.8–5.6)
Mean Plasma Glucose: 229 mg/dL

## 2022-07-09 LAB — GLUCOSE, CAPILLARY
Glucose-Capillary: 225 mg/dL — ABNORMAL HIGH (ref 70–99)
Glucose-Capillary: 198 mg/dL — ABNORMAL HIGH (ref 70–99)

## 2022-07-09 MED ORDER — BLOOD GLUCOSE METER KIT
PACK | 0 refills | Status: AC
  Filled 2022-07-09 (×2): qty 1, 1d supply, fill #0
  Filled 2022-07-29: qty 1, 30d supply, fill #0

## 2022-07-09 MED ORDER — GLUCOSE BLOOD VI STRP
ORAL_STRIP | 0 refills | Status: DC
  Filled 2022-07-09: qty 100, 25d supply, fill #0

## 2022-07-09 MED ORDER — HYDROCODONE-ACETAMINOPHEN 10-325 MG PO TABS
1.0000 | ORAL_TABLET | Freq: Two times a day (BID) | ORAL | 0 refills | Status: DC | PRN
  Filled 2022-07-09: qty 15, 8d supply, fill #0

## 2022-07-09 MED ORDER — INSULIN GLARGINE-YFGN 100 UNIT/ML ~~LOC~~ SOLN
15.0000 [IU] | Freq: Every day | SUBCUTANEOUS | Status: DC
  Administered 2022-07-09: 15 [IU] via SUBCUTANEOUS
  Filled 2022-07-09: qty 0.15

## 2022-07-09 MED ORDER — ACCU-CHEK SOFTCLIX LANCETS MISC
0 refills | Status: AC
  Filled 2022-07-09: qty 100, 25d supply, fill #0
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
## 2022-07-09 NOTE — Progress Notes (Signed)
  Echocardiogram 2D Echocardiogram has been performed.  [name] 07/09/2022, 10:20 AM

## 2022-07-09 NOTE — TOC Transition Note (Signed)
Transition of Care [facility]) - CM/SW Discharge Note   Patient Details  Name: Timothy Peters MRN: [id number] Date of Birth: 1950/12/07  [facility] CM/SW Contact:  Vassie Moselle, LCSW Phone Number: 07/09/2022, 12:39 PM   Clinical Narrative:    Met with pt and confirmed plan for OPPT. An order has been placed for OPPT at [facility]. No further TOC needs identified at this time.    Final next level of care: OP Rehab Barriers to Discharge: No Barriers Identified   Patient Goals and CMS Choice Patient states their goals for this hospitalization and ongoing recovery are:: To go home CMS Medicare.gov Compare Post Acute Care list provided to:: Patient Choice offered to / list presented to : Patient  Discharge Placement                       Discharge Plan and Services                DME Arranged: N/A DME Agency: NA                  Social Determinants of Health (SDOH) Interventions     Readmission Risk Interventions     No data to display

## 2022-07-09 NOTE — Inpatient Diabetes Management (Addendum)
Inpatient Diabetes Program Recommendations  AACE/ADA: New Consensus Statement on Inpatient Glycemic Control (2015)  Target Ranges:  Prepandial:   less than 140 mg/dL      Peak postprandial:   less than 180 mg/dL (1-2 hours)      Critically ill patients:  140 - 180 mg/dL   Lab Results  Component Value Date   GLUCAP 225 (H) 07/09/2022   HGBA1C 9.6 (H) 07/08/2022    Review of Glycemic Control  Latest Reference Range & Units 07/08/22 16:09 07/08/22 20:22 07/09/22 07:51  Glucose-Capillary 70 - 99 mg/dL 213 (H) 265 (H) 225 (H)  (H): Data is abnormally high Diabetes history: Type 2 DM Outpatient Diabetes medications: Trulicity 1.5 mg qwk, Glipizide 5 mg QD, Basaglar 20 units QD, Metformin 1000 mg BID Current orders for Inpatient glycemic control: Metformin 1000 mg BID, Semglee 15 units QD, Novolog 0-9 units TID   Inpatient Diabetes Program Recommendations:     If patient to remain inpatient consider: Increasing Semglee to 20 units QD (patient's home dose) Patient will need a glucose meter at discharge. Glucose meter kit #[id number]   Will plan to see again today.   Addendum: Spoke with patient again to reinforce concepts discussed yesterday.  Reviewed Freestyle libre with patient and wife, reviewed application, phone device set up, risks and benefits and when to call MD. Patient is interested. Applied to left upper arm.  Wife is making patient a follow up appointment. Patient understands to eat when taking Glipizide, frequency of CBG checks and when to call MD. No further questions at this time.  Thanks, Bronson Curb, MSN, RNC-OB Diabetes Coordinator [phone] (8a-5p)

## 2022-07-09 NOTE — Discharge Instructions (Signed)
Local Endocrinologists [facility] [phone]) Dr. Philemon Kingdom Dr. Janie [facility] [phone]) Dr. Delrae Rend [facility] [phone]) Dr. Jacelyn Pi Dr. Anda Kraft [facility] [phone]) Dr. Daneil Dolin [facility] [phone]) [place] office]  [phone]) Shari Prows office] Dr. Lavone Orn Dr. Mee Hives [facility]) [phone]) Autumn Barbaraann Barthel Ronnald Ramp), PA Dr. Amalia Greenhouse Dr. Marsh Dolly. [facility] [phone]) Dr. Glade Lloyd [facility] [phone]) Dr. Orville Govern Dr. Lelon Huh Dr. Lavone Nian, FNP Dr. Carolynn Serve. [place] in [place] [phone])

## 2022-07-10 ENCOUNTER — Other Ambulatory Visit (HOSPITAL_COMMUNITY): Payer: Self-pay

## 2022-07-16 ENCOUNTER — Other Ambulatory Visit: Payer: Self-pay | Admitting: Internal Medicine

## 2022-07-16 ENCOUNTER — Ambulatory Visit
Admission: RE | Admit: 2022-07-16 | Discharge: 2022-07-16 | Disposition: A | Discharge status: Home / Self Care | Payer: Medicare HMO | Source: Ambulatory Visit | Attending: Internal Medicine | Admitting: Internal Medicine

## 2022-07-16 DIAGNOSIS — M79672 Pain in left foot: Secondary | ICD-10-CM

## 2022-07-16 DIAGNOSIS — M7989 Other specified soft tissue disorders: Secondary | ICD-10-CM | POA: Diagnosis not present

## 2022-07-16 DIAGNOSIS — I480 Paroxysmal atrial fibrillation: Secondary | ICD-10-CM | POA: Diagnosis not present

## 2022-07-16 DIAGNOSIS — Z9989 Dependence on other enabling machines and devices: Secondary | ICD-10-CM | POA: Diagnosis not present

## 2022-07-16 DIAGNOSIS — D649 Anemia, unspecified: Secondary | ICD-10-CM | POA: Diagnosis not present

## 2022-07-16 DIAGNOSIS — E1165 Type 2 diabetes mellitus with hyperglycemia: Secondary | ICD-10-CM | POA: Diagnosis not present

## 2022-07-16 DIAGNOSIS — J439 Emphysema, unspecified: Secondary | ICD-10-CM | POA: Diagnosis not present

## 2022-07-16 DIAGNOSIS — R55 Syncope and collapse: Secondary | ICD-10-CM | POA: Diagnosis not present

## 2022-07-16 DIAGNOSIS — R4689 Other symptoms and signs involving appearance and behavior: Secondary | ICD-10-CM | POA: Diagnosis not present

## 2022-07-22 ENCOUNTER — Other Ambulatory Visit (HOSPITAL_COMMUNITY): Payer: Self-pay

## 2022-07-22 DIAGNOSIS — M25572 Pain in left ankle and joints of left foot: Secondary | ICD-10-CM | POA: Diagnosis not present

## 2022-07-24 DIAGNOSIS — R059 Cough, unspecified: Secondary | ICD-10-CM | POA: Diagnosis not present

## 2022-07-24 DIAGNOSIS — U071 COVID-19: Secondary | ICD-10-CM | POA: Diagnosis not present

## 2022-07-25 ENCOUNTER — Other Ambulatory Visit (HOSPITAL_COMMUNITY): Payer: Self-pay

## 2022-07-25 MED ORDER — PAXLOVID (300/100) 20 X 150 MG & 10 X 100MG PO TBPK
ORAL_TABLET | ORAL | 0 refills | Status: DC
  Filled 2022-07-25: qty 30, 5d supply, fill #0

## 2022-07-29 ENCOUNTER — Other Ambulatory Visit (HOSPITAL_COMMUNITY): Payer: Self-pay

## 2022-07-29 MED ORDER — FREESTYLE LIBRE 2 READER DEVI
0 refills | Status: DC
  Filled 2022-07-29: qty 1, 1d supply, fill #0
  Filled 2022-08-01: qty 1, 90d supply, fill #0

## 2022-07-29 MED ORDER — FREESTYLE LIBRE 2 SENSOR MISC
3 refills | Status: DC
  Filled 2022-07-29: qty 6, 84d supply, fill #0
  Filled 2022-08-01: qty 2, 28d supply, fill #0

## 2022-07-30 ENCOUNTER — Ambulatory Visit (INDEPENDENT_AMBULATORY_CARE_PROVIDER_SITE_OTHER): Payer: No Typology Code available for payment source

## 2022-07-30 ENCOUNTER — Ambulatory Visit: Payer: No Typology Code available for payment source | Attending: Internal Medicine | Admitting: Internal Medicine

## 2022-07-30 ENCOUNTER — Encounter: Payer: Self-pay | Admitting: Internal Medicine

## 2022-07-30 VITALS — BP 108/68 | HR 82 | Ht 71.0 in | Wt 201.0 lb

## 2022-07-30 DIAGNOSIS — R0683 Snoring: Secondary | ICD-10-CM

## 2022-07-30 DIAGNOSIS — R400 Somnolence: Secondary | ICD-10-CM | POA: Diagnosis not present

## 2022-07-30 DIAGNOSIS — R55 Syncope and collapse: Secondary | ICD-10-CM

## 2022-07-30 DIAGNOSIS — G4730 Sleep apnea, unspecified: Secondary | ICD-10-CM | POA: Diagnosis not present

## 2022-07-30 NOTE — Progress Notes (Unsigned)
Enrolled patient for a 14 Day Zio XT monitor to be mailed to patients home 
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
## 2022-07-30 NOTE — Patient Instructions (Signed)
Medication Instructions:  Your physician recommends that you continue on your current medications as directed. Please refer to the Current Medication list given to you today.  *If you need a refill on your cardiac medications before your next appointment, please call your pharmacy*   Lab Work: NONE If you have labs (blood work) drawn today and your tests are completely normal, you will receive your results only by: [place] (if you have MyChart) OR A paper copy in the mail If you have any lab test that is abnormal or we need to change your treatment, we will call you to review the results.   Testing/Procedures: Bryn Gulling- Long Term Monitor Instructions  Your physician has requested you wear a ZIO patch monitor for 14 days.  This is a single patch monitor. [employer] supplies one patch monitor per enrollment. Additional stickers are not available. Please do not apply patch if you will be having a Nuclear Stress Test,  Echocardiogram, Cardiac CT, MRI, or Chest Xray during the period you would be wearing the  monitor. The patch cannot be worn during these tests. You cannot remove and re-apply the  ZIO XT patch monitor.  Your ZIO patch monitor will be mailed 3 day USPS to your address on file. It may take 3-5 days  to receive your monitor after you have been enrolled.  Once you have received your monitor, please review the enclosed instructions. Your monitor  has already been registered assigning a specific monitor serial # to you.  Billing and Patient Assistance Program Information  We have supplied [employer] with any of your insurance information on file for billing purposes. [employer] offers a sliding scale Patient Assistance Program for patients that do not have  insurance, or whose insurance does not completely cover the cost of the ZIO monitor.  You must apply for the Patient Assistance Program to qualify for this discounted rate.  To apply, please call [employer] at [phone], select  option 4, select option 2, ask to apply for  Patient Assistance Program. Theodore Demark will ask your household income, and how many people  are in your household. They will quote your out-of-pocket cost based on that information.  [employer] will also be able to set up a 26-month interest-free payment plan if needed.  Applying the monitor   Shave hair from upper left chest.  Hold abrader disc by orange tab. Rub abrader in 40 strokes over the upper left chest as  indicated in your monitor instructions.  Clean area with 4 enclosed alcohol pads. Let dry.  Apply patch as indicated in monitor instructions. Patch will be placed under collarbone on left  side of chest with arrow pointing upward.  Rub patch adhesive wings for 2 minutes. Remove white label marked "1". Remove the white  label marked "2". Rub patch adhesive wings for 2 additional minutes.  While looking in a mirror, press and release button in center of patch. A small green light will  flash 3-4 times. This will be your only indicator that the monitor has been turned on.  Do not shower for the first 24 hours. You may shower after the first 24 hours.  Press the button if you feel a symptom. You will hear a small click. Record Date, Time and  Symptom in the Patient Logbook.  When you are ready to remove the patch, follow instructions on the last 2 pages of Patient  Logbook. Stick patch monitor onto the last page of Patient Logbook.  Place Patient Logbook in  the blue and white box. Use locking tab on box and tape box closed  securely. The blue and white box has prepaid postage on it. Please place it in the mailbox as  soon as possible. Your physician should have your test results approximately 7 days after the  monitor has been mailed back to [facility].  Call [place] at [phone] if you have questions regarding  your ZIO XT patch monitor. Call them immediately if you see an orange light blinking on your  monitor.   If your monitor falls off in less than 4 days, contact our Monitor department at [phone].  If your monitor becomes loose or falls off after 4 days call [employer] at [phone] for  suggestions on securing your monitor    Follow-Up: At [facility], you and your health needs are our priority.  As part of our continuing mission to provide you with exceptional heart care, we have created designated Provider Care Teams.  These Care Teams include your primary Cardiologist (physician) and Advanced Practice Providers (APPs -  Physician Assistants and Nurse Practitioners) who all work together to provide you with the care you need, when you need it.  We recommend signing up for the patient portal called "MyChart".  Sign up information is provided on this After Visit Summary.  MyChart is used to connect with patients for Virtual Visits (Telemedicine).  Patients are able to view lab/test results, encounter notes, upcoming appointments, etc.  Non-urgent messages can be sent to your provider as well.   To learn more about what you can do with MyChart, go to [email or web address].    Your next appointment:   As Needed   The format for your next appointment:   In Person  Provider:   Janina Mayo, MD

## 2022-08-01 ENCOUNTER — Other Ambulatory Visit (HOSPITAL_COMMUNITY): Payer: Self-pay

## 2022-08-01 MED ORDER — METFORMIN HCL 1000 MG PO TABS
1000.0000 mg | ORAL_TABLET | Freq: Two times a day (BID) | ORAL | 1 refills | Status: DC
  Filled 2022-08-01: qty 180, 90d supply, fill #0
  Filled 2022-11-01: qty 180, 90d supply, fill #1

## 2022-08-01 MED ORDER — GLUCOSE BLOOD VI STRP
ORAL_STRIP | Freq: Every day | 3 refills | Status: DC
  Filled 2022-08-01: qty 50, 50d supply, fill #0
  Filled 2022-12-13: qty 50, 50d supply, fill #1

## 2022-08-02 ENCOUNTER — Other Ambulatory Visit (HOSPITAL_COMMUNITY): Payer: Self-pay

## 2022-08-05 NOTE — Addendum Note (Signed)
Addended by: Angelena Form on: 08/05/2022 04:37 PM   Modules accepted: Orders

## 2022-08-07 ENCOUNTER — Ambulatory Visit: Payer: No Typology Code available for payment source | Admitting: Physical Therapy

## 2022-08-09 ENCOUNTER — Telehealth: Payer: Self-pay | Admitting: *Deleted

## 2022-08-09 NOTE — Telephone Encounter (Signed)
Prior Authorization for split night sleep study sent to [facility] via web portal. PA Number [id number]. Valid dates 08/09/22 to 11/07/22.

## 2022-08-13 ENCOUNTER — Other Ambulatory Visit (HOSPITAL_COMMUNITY): Payer: Self-pay

## 2022-08-16 ENCOUNTER — Ambulatory Visit: Payer: Medicare HMO
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
## 2022-08-21 ENCOUNTER — Ambulatory Visit: Payer: Medicare HMO | Attending: Internal Medicine

## 2022-08-21 DIAGNOSIS — R2689 Other abnormalities of gait and mobility: Secondary | ICD-10-CM | POA: Diagnosis not present

## 2022-08-21 DIAGNOSIS — M5459 Other low back pain: Secondary | ICD-10-CM

## 2022-08-21 DIAGNOSIS — M25511 Pain in right shoulder: Secondary | ICD-10-CM | POA: Diagnosis not present

## 2022-08-21 DIAGNOSIS — R55 Syncope and collapse: Secondary | ICD-10-CM | POA: Diagnosis not present

## 2022-08-21 DIAGNOSIS — M25562 Pain in left knee: Secondary | ICD-10-CM | POA: Diagnosis not present

## 2022-08-21 DIAGNOSIS — M25572 Pain in left ankle and joints of left foot: Secondary | ICD-10-CM | POA: Diagnosis not present

## 2022-08-21 DIAGNOSIS — R278 Other lack of coordination: Secondary | ICD-10-CM | POA: Diagnosis not present

## 2022-08-21 DIAGNOSIS — M6281 Muscle weakness (generalized): Secondary | ICD-10-CM

## 2022-08-26 ENCOUNTER — Encounter: Payer: Self-pay | Admitting: Physical Therapy

## 2022-08-26 ENCOUNTER — Ambulatory Visit: Payer: Medicare HMO | Admitting: Physical Therapy

## 2022-08-26 DIAGNOSIS — R2689 Other abnormalities of gait and mobility: Secondary | ICD-10-CM

## 2022-08-26 DIAGNOSIS — R278 Other lack of coordination: Secondary | ICD-10-CM

## 2022-08-26 DIAGNOSIS — M25511 Pain in right shoulder: Secondary | ICD-10-CM | POA: Diagnosis not present

## 2022-08-26 DIAGNOSIS — M5459 Other low back pain: Secondary | ICD-10-CM | POA: Diagnosis not present

## 2022-08-26 DIAGNOSIS — M6281 Muscle weakness (generalized): Secondary | ICD-10-CM | POA: Diagnosis not present

## 2022-08-26 DIAGNOSIS — M25562 Pain in left knee: Secondary | ICD-10-CM

## 2022-08-26 DIAGNOSIS — R55 Syncope and collapse: Secondary | ICD-10-CM | POA: Diagnosis not present
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
## 2022-08-28 ENCOUNTER — Ambulatory Visit: Payer: Medicare HMO | Admitting: Physical Therapy

## 2022-08-28 ENCOUNTER — Encounter: Payer: Self-pay | Admitting: Physical Therapy

## 2022-08-28 DIAGNOSIS — M5459 Other low back pain: Secondary | ICD-10-CM | POA: Diagnosis not present

## 2022-08-28 DIAGNOSIS — M25511 Pain in right shoulder: Secondary | ICD-10-CM

## 2022-08-28 DIAGNOSIS — R55 Syncope and collapse: Secondary | ICD-10-CM | POA: Diagnosis not present

## 2022-08-28 DIAGNOSIS — R2689 Other abnormalities of gait and mobility: Secondary | ICD-10-CM

## 2022-08-28 DIAGNOSIS — M6281 Muscle weakness (generalized): Secondary | ICD-10-CM | POA: Diagnosis not present

## 2022-08-28 DIAGNOSIS — M25562 Pain in left knee: Secondary | ICD-10-CM

## 2022-08-28 DIAGNOSIS — R278 Other lack of coordination: Secondary | ICD-10-CM
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
## 2022-08-29 DIAGNOSIS — E1165 Type 2 diabetes mellitus with hyperglycemia: Secondary | ICD-10-CM | POA: Diagnosis not present

## 2022-08-29 DIAGNOSIS — M549 Dorsalgia, unspecified: Secondary | ICD-10-CM | POA: Diagnosis not present

## 2022-08-29 DIAGNOSIS — R55 Syncope and collapse: Secondary | ICD-10-CM

## 2022-08-29 DIAGNOSIS — Z794 Long term (current) use of insulin: Secondary | ICD-10-CM | POA: Diagnosis not present
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
## 2022-08-30 ENCOUNTER — Ambulatory Visit (HOSPITAL_BASED_OUTPATIENT_CLINIC_OR_DEPARTMENT_OTHER): Payer: Medicare HMO | Attending: Internal Medicine | Admitting: Cardiovascular Disease

## 2022-08-30 VITALS — Ht 71.0 in | Wt 198.0 lb

## 2022-08-30 DIAGNOSIS — R400 Somnolence: Secondary | ICD-10-CM | POA: Diagnosis not present

## 2022-08-30 DIAGNOSIS — R0683 Snoring: Secondary | ICD-10-CM | POA: Insufficient documentation

## 2022-08-30 DIAGNOSIS — G4733 Obstructive sleep apnea (adult) (pediatric): Secondary | ICD-10-CM | POA: Diagnosis not present

## 2022-08-30 DIAGNOSIS — G4730 Sleep apnea, unspecified: Secondary | ICD-10-CM

## 2022-09-02 ENCOUNTER — Ambulatory Visit: Payer: Medicare HMO

## 2022-09-04 ENCOUNTER — Ambulatory Visit: Payer: Medicare HMO | Admitting: Physical Therapy

## 2022-09-04 ENCOUNTER — Encounter: Payer: Self-pay | Admitting: Physical Therapy

## 2022-09-04 DIAGNOSIS — R278 Other lack of coordination: Secondary | ICD-10-CM

## 2022-09-04 DIAGNOSIS — M25562 Pain in left knee: Secondary | ICD-10-CM | POA: Diagnosis not present

## 2022-09-04 DIAGNOSIS — R55 Syncope and collapse: Secondary | ICD-10-CM | POA: Diagnosis not present

## 2022-09-04 DIAGNOSIS — R2689 Other abnormalities of gait and mobility: Secondary | ICD-10-CM

## 2022-09-04 DIAGNOSIS — M5459 Other low back pain: Secondary | ICD-10-CM

## 2022-09-04 DIAGNOSIS — M25511 Pain in right shoulder: Secondary | ICD-10-CM

## 2022-09-04 DIAGNOSIS — M6281 Muscle weakness (generalized): Secondary | ICD-10-CM | POA: Diagnosis not present
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
## 2022-09-09 ENCOUNTER — Ambulatory Visit: Payer: Medicare HMO

## 2022-09-09 DIAGNOSIS — R278 Other lack of coordination: Secondary | ICD-10-CM | POA: Diagnosis not present

## 2022-09-09 DIAGNOSIS — M6281 Muscle weakness (generalized): Secondary | ICD-10-CM | POA: Diagnosis not present

## 2022-09-09 DIAGNOSIS — M5459 Other low back pain: Secondary | ICD-10-CM | POA: Diagnosis not present

## 2022-09-09 DIAGNOSIS — M25511 Pain in right shoulder: Secondary | ICD-10-CM

## 2022-09-09 DIAGNOSIS — R2689 Other abnormalities of gait and mobility: Secondary | ICD-10-CM | POA: Diagnosis not present

## 2022-09-09 DIAGNOSIS — M25562 Pain in left knee: Secondary | ICD-10-CM

## 2022-09-09 DIAGNOSIS — R55 Syncope and collapse: Secondary | ICD-10-CM | POA: Diagnosis not present

## 2022-09-11 ENCOUNTER — Ambulatory Visit: Payer: Medicare HMO | Admitting: Physical Therapy

## 2022-09-11 ENCOUNTER — Encounter: Payer: Self-pay | Admitting: Physical Therapy

## 2022-09-11 DIAGNOSIS — N179 Acute kidney failure, unspecified: Secondary | ICD-10-CM | POA: Diagnosis not present

## 2022-09-11 DIAGNOSIS — Z8616 Personal history of COVID-19: Secondary | ICD-10-CM | POA: Diagnosis not present

## 2022-09-11 DIAGNOSIS — M25511 Pain in right shoulder: Secondary | ICD-10-CM

## 2022-09-11 DIAGNOSIS — M5459 Other low back pain: Secondary | ICD-10-CM

## 2022-09-11 DIAGNOSIS — Z833 Family history of diabetes mellitus: Secondary | ICD-10-CM | POA: Diagnosis not present

## 2022-09-11 DIAGNOSIS — R278 Other lack of coordination: Secondary | ICD-10-CM

## 2022-09-11 DIAGNOSIS — I10 Essential (primary) hypertension: Secondary | ICD-10-CM | POA: Diagnosis not present

## 2022-09-11 DIAGNOSIS — R55 Syncope and collapse: Secondary | ICD-10-CM | POA: Diagnosis not present

## 2022-09-11 DIAGNOSIS — R2689 Other abnormalities of gait and mobility: Secondary | ICD-10-CM

## 2022-09-11 DIAGNOSIS — Y92009 Unspecified place in unspecified non-institutional (private) residence as the place of occurrence of the external cause: Secondary | ICD-10-CM | POA: Diagnosis not present

## 2022-09-11 DIAGNOSIS — Z7985 Long-term (current) use of injectable non-insulin antidiabetic drugs: Secondary | ICD-10-CM | POA: Diagnosis not present

## 2022-09-11 DIAGNOSIS — R7989 Other specified abnormal findings of blood chemistry: Secondary | ICD-10-CM | POA: Diagnosis present

## 2022-09-11 DIAGNOSIS — E860 Dehydration: Secondary | ICD-10-CM | POA: Diagnosis not present

## 2022-09-11 DIAGNOSIS — T465X5A Adverse effect of other antihypertensive drugs, initial encounter: Secondary | ICD-10-CM | POA: Diagnosis present

## 2022-09-11 DIAGNOSIS — Z8249 Family history of ischemic heart disease and other diseases of the circulatory system: Secondary | ICD-10-CM | POA: Diagnosis not present

## 2022-09-11 DIAGNOSIS — Z8619 Personal history of other infectious and parasitic diseases: Secondary | ICD-10-CM | POA: Diagnosis not present

## 2022-09-11 DIAGNOSIS — Z79899 Other long term (current) drug therapy: Secondary | ICD-10-CM | POA: Diagnosis not present

## 2022-09-11 DIAGNOSIS — Z7982 Long term (current) use of aspirin: Secondary | ICD-10-CM | POA: Diagnosis not present

## 2022-09-11 DIAGNOSIS — R630 Anorexia: Secondary | ICD-10-CM | POA: Diagnosis present

## 2022-09-11 DIAGNOSIS — E785 Hyperlipidemia, unspecified: Secondary | ICD-10-CM | POA: Diagnosis not present

## 2022-09-11 DIAGNOSIS — G8929 Other chronic pain: Secondary | ICD-10-CM | POA: Diagnosis present

## 2022-09-11 DIAGNOSIS — M25562 Pain in left knee: Secondary | ICD-10-CM

## 2022-09-11 DIAGNOSIS — Z823 Family history of stroke: Secondary | ICD-10-CM | POA: Diagnosis not present

## 2022-09-11 DIAGNOSIS — Z7984 Long term (current) use of oral hypoglycemic drugs: Secondary | ICD-10-CM | POA: Diagnosis not present

## 2022-09-11 DIAGNOSIS — E1165 Type 2 diabetes mellitus with hyperglycemia: Secondary | ICD-10-CM | POA: Diagnosis not present

## 2022-09-11 DIAGNOSIS — M6281 Muscle weakness (generalized): Secondary | ICD-10-CM

## 2022-09-11 DIAGNOSIS — Z809 Family history of malignant neoplasm, unspecified: Secondary | ICD-10-CM | POA: Diagnosis not present

## 2022-09-11 DIAGNOSIS — R42 Dizziness and giddiness: Secondary | ICD-10-CM | POA: Diagnosis not present

## 2022-09-11 DIAGNOSIS — N200 Calculus of kidney: Secondary | ICD-10-CM | POA: Diagnosis not present

## 2022-09-11 DIAGNOSIS — I952 Hypotension due to drugs: Secondary | ICD-10-CM | POA: Diagnosis not present

## 2022-09-11 DIAGNOSIS — Z87891 Personal history of nicotine dependence: Secondary | ICD-10-CM | POA: Diagnosis not present

## 2022-09-11 DIAGNOSIS — Z794 Long term (current) use of insulin: Secondary | ICD-10-CM | POA: Diagnosis not present
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
## 2022-09-13 ENCOUNTER — Telehealth: Payer: Self-pay | Admitting: *Deleted

## 2022-09-13 ENCOUNTER — Emergency Department (HOSPITAL_COMMUNITY): Payer: Medicare HMO

## 2022-09-13 ENCOUNTER — Inpatient Hospital Stay (HOSPITAL_COMMUNITY)
Admission: EM | Admit: 2022-09-13 | Discharge: 2022-09-15 | DRG: 684 | Disposition: A | Discharge status: Home / Self Care | Payer: Medicare HMO | Attending: Internal Medicine | Admitting: Internal Medicine

## 2022-09-13 ENCOUNTER — Other Ambulatory Visit: Payer: Self-pay

## 2022-09-13 ENCOUNTER — Encounter (HOSPITAL_BASED_OUTPATIENT_CLINIC_OR_DEPARTMENT_OTHER): Payer: Self-pay | Admitting: Cardiovascular Disease

## 2022-09-13 DIAGNOSIS — R55 Syncope and collapse: Secondary | ICD-10-CM | POA: Diagnosis present

## 2022-09-13 DIAGNOSIS — E860 Dehydration: Principal | ICD-10-CM | POA: Diagnosis present

## 2022-09-13 DIAGNOSIS — E1165 Type 2 diabetes mellitus with hyperglycemia: Secondary | ICD-10-CM | POA: Diagnosis present

## 2022-09-13 DIAGNOSIS — R7989 Other specified abnormal findings of blood chemistry: Secondary | ICD-10-CM | POA: Diagnosis present

## 2022-09-13 DIAGNOSIS — Z7984 Long term (current) use of oral hypoglycemic drugs: Secondary | ICD-10-CM | POA: Diagnosis not present

## 2022-09-13 DIAGNOSIS — Y92009 Unspecified place in unspecified non-institutional (private) residence as the place of occurrence of the external cause: Secondary | ICD-10-CM

## 2022-09-13 DIAGNOSIS — Z7985 Long-term (current) use of injectable non-insulin antidiabetic drugs: Secondary | ICD-10-CM

## 2022-09-13 DIAGNOSIS — Z7982 Long term (current) use of aspirin: Secondary | ICD-10-CM | POA: Diagnosis not present

## 2022-09-13 DIAGNOSIS — N200 Calculus of kidney: Secondary | ICD-10-CM | POA: Diagnosis present

## 2022-09-13 DIAGNOSIS — N179 Acute kidney failure, unspecified: Principal | ICD-10-CM | POA: Diagnosis present

## 2022-09-13 DIAGNOSIS — Z794 Long term (current) use of insulin: Secondary | ICD-10-CM | POA: Diagnosis not present

## 2022-09-13 DIAGNOSIS — Z87891 Personal history of nicotine dependence: Secondary | ICD-10-CM | POA: Diagnosis not present

## 2022-09-13 DIAGNOSIS — Z823 Family history of stroke: Secondary | ICD-10-CM

## 2022-09-13 DIAGNOSIS — R630 Anorexia: Secondary | ICD-10-CM | POA: Diagnosis present

## 2022-09-13 DIAGNOSIS — N529 Male erectile dysfunction, unspecified: Secondary | ICD-10-CM | POA: Diagnosis present

## 2022-09-13 DIAGNOSIS — Z8619 Personal history of other infectious and parasitic diseases: Secondary | ICD-10-CM | POA: Diagnosis not present

## 2022-09-13 DIAGNOSIS — I10 Essential (primary) hypertension: Secondary | ICD-10-CM | POA: Diagnosis present

## 2022-09-13 DIAGNOSIS — Z809 Family history of malignant neoplasm, unspecified: Secondary | ICD-10-CM

## 2022-09-13 DIAGNOSIS — T465X5A Adverse effect of other antihypertensive drugs, initial encounter: Secondary | ICD-10-CM | POA: Diagnosis present

## 2022-09-13 DIAGNOSIS — Z79899 Other long term (current) drug therapy: Secondary | ICD-10-CM

## 2022-09-13 DIAGNOSIS — Z8616 Personal history of COVID-19: Secondary | ICD-10-CM

## 2022-09-13 DIAGNOSIS — E785 Hyperlipidemia, unspecified: Secondary | ICD-10-CM | POA: Diagnosis present

## 2022-09-13 DIAGNOSIS — I952 Hypotension due to drugs: Secondary | ICD-10-CM | POA: Diagnosis present

## 2022-09-13 DIAGNOSIS — M545 Low back pain, unspecified: Secondary | ICD-10-CM | POA: Diagnosis present

## 2022-09-13 DIAGNOSIS — Z8249 Family history of ischemic heart disease and other diseases of the circulatory system: Secondary | ICD-10-CM

## 2022-09-13 DIAGNOSIS — Z833 Family history of diabetes mellitus: Secondary | ICD-10-CM | POA: Diagnosis not present

## 2022-09-13 DIAGNOSIS — G8929 Other chronic pain: Secondary | ICD-10-CM | POA: Diagnosis present

## 2022-09-13 LAB — COMPREHENSIVE METABOLIC PANEL
ALT: 9 U/L (ref 0–44)
AST: 17 U/L (ref 15–41)
Albumin: 3.6 g/dL (ref 3.5–5.0)
Alkaline Phosphatase: 72 U/L (ref 38–126)
Anion gap: 12 (ref 5–15)
BUN: 38 mg/dL — ABNORMAL HIGH (ref 8–23)
CO2: 21 mmol/L — ABNORMAL LOW (ref 22–32)
Calcium: 9.4 mg/dL (ref 8.9–10.3)
Chloride: 104 mmol/L (ref 98–111)
Creatinine, Ser: 2.38 mg/dL — ABNORMAL HIGH (ref 0.61–1.24)
GFR, Estimated: 29 mL/min — ABNORMAL LOW (ref >60)
Glucose, Bld: 183 mg/dL — ABNORMAL HIGH (ref 70–99)
Potassium: 4.2 mmol/L (ref 3.5–5.1)
Sodium: 137 mmol/L (ref 135–145)
Total Bilirubin: 0.5 mg/dL (ref 0.3–1.2)
Total Protein: 7.1 g/dL (ref 6.5–8.1)

## 2022-09-13 LAB — CBC WITH DIFFERENTIAL/PLATELET
Abs Immature Granulocytes: 0 10*3/uL (ref 0.00–0.07)
Band Neutrophils: 0 %
Basophils Absolute: 0.1 10*3/uL (ref 0.0–0.1)
Basophils Relative: 1 %
Blasts: 0 %
Eosinophils Absolute: 2.3 10*3/uL — ABNORMAL HIGH (ref 0.0–0.5)
Eosinophils Relative: 22 %
HCT: 33.8 % — ABNORMAL LOW (ref 39.0–52.0)
Hemoglobin: 10.3 g/dL — ABNORMAL LOW (ref 13.0–17.0)
Lymphocytes Relative: 19 %
Lymphs Abs: 2 10*3/uL (ref 0.7–4.0)
MCH: 26.8 pg (ref 26.0–34.0)
MCHC: 30.5 g/dL (ref 30.0–36.0)
MCV: 87.8 fL (ref 80.0–100.0)
Metamyelocytes Relative: 0 %
Monocytes Absolute: 0.9 10*3/uL (ref 0.1–1.0)
Monocytes Relative: 9 %
Myelocytes: 0 %
Neutro Abs: 5.1 10*3/uL (ref 1.7–7.7)
Neutrophils Relative %: 49 %
Other: 0 %
Platelets: 364 10*3/uL (ref 150–400)
Promyelocytes Relative: 0 %
RBC: 3.85 MIL/uL — ABNORMAL LOW (ref 4.22–5.81)
RDW: 15.3 % (ref 11.5–15.5)
WBC: 10.4 10*3/uL (ref 4.0–10.5)
nRBC: 0 % (ref 0.0–0.2)
nRBC: 0 /100 WBC

## 2022-09-13 LAB — CBG MONITORING, ED: Glucose-Capillary: 164 mg/dL — ABNORMAL HIGH (ref 70–99)

## 2022-09-13 MED ORDER — SODIUM CHLORIDE 0.9 % IV SOLN: INTRAVENOUS | Status: DC

## 2022-09-13 NOTE — Telephone Encounter (Signed)
Left message to return a call to discuss sleep study results and recommendations. 

## 2022-09-13 NOTE — Procedures (Signed)
Patient Name: Timothy Peters, Timothy Peters Date: 08/30/2022 Gender: Male D.O.B: 08/06/1951 Age (years): 66 Referring Provider: Royetta [place] Height (inches): 71 Interpreting Physician: Shelva Majestic MD, ABSM Weight (lbs): 198 RPSGT: Jorge Ny BMI: 28 MRN: [id number] Neck Size: 16.50  CLINICAL INFORMATION Sleep Study Type: Split Night CPAP  Indication for sleep study: Diabetes, Hypertension, Obesity, OSA, Snoring  Epworth Sleepiness Score: 10  SLEEP STUDY TECHNIQUE As per the AASM Manual for the Scoring of Sleep and Associated Events v2.3 (April 2016) with a hypopnea requiring 4% desaturations.  The channels recorded and monitored were frontal, central and occipital EEG, electrooculogram (EOG), submentalis EMG (chin), nasal and oral airflow, thoracic and abdominal wall motion, anterior tibialis EMG, snore microphone, electrocardiogram, and pulse oximetry. Continuous positive airway pressure (CPAP) was initiated when the patient met split night criteria and was titrated according to treat sleep-disordered breathing.  MEDICATIONS aspirin 81 MG tablet blood glucose meter kit and supplies Continuous Blood Gluc Receiver (FREESTYLE LIBRE 2 READER) DEVI Continuous Blood Gluc Sensor (FREESTYLE LIBRE 2 SENSOR) MISC cyclobenzaprine (FLEXERIL) 10 MG tablet docusate sodium (COLACE) 100 MG capsule Dulaglutide (TRULICITY) 1.5 KX/3.8HW SOPN DULoxetine (CYMBALTA) 60 MG capsule gabapentin (NEURONTIN) 300 MG capsule glipiZIDE (GLUCOTROL) 5 MG tablet glucose blood test strip glucose blood test strip glucose blood test strip HYDROcodone-acetaminophen (NORCO) 10-325 MG tablet ibuprofen (ADVIL) 200 MG tablet Insulin Glargine (BASAGLAR KWIKPEN) 100 UNIT/ML Insulin Pen Needle 31G X 5 MM MISC LINIMENTS EX losartan-hydrochlorothiazide (HYZAAR) 100-12.5 MG tablet metFORMIN (GLUCOPHAGE) 1000 MG tablet metoprolol succinate (TOPROL-XL) 25 MG 24 hr tablet nirmatrelvir & ritonavir  (PAXLOVID, 300/100,) 20 x 150 MG & 10 x '100MG'$  TBPK sildenafil (VIAGRA) 100 MG tablet simvastatin (ZOCOR) 20 MG tablet Medications self-administered by patient taken the night of the study : N/A  RESPIRATORY PARAMETERS Diagnostic Total AHI (/hr): 21.9 RDI (/hr): 23.7 OA Index (/hr): - CA Index (/hr): 0.0 REM AHI (/hr): N/A NREM AHI (/hr): 21.9 Supine AHI (/hr): N/A Non-supine AHI (/hr): 21.9 Min O2 Sat (%): 84.0 Mean O2 (%): 93.3 Time below 88% (min): 1  Titration Optimal Pressure (cm): 10 AHI at Optimal Pressure (/hr): 0 Min O2 at Optimal Pressure (%): 93.0 Supine % at Optimal (%): 0 Sleep % at Optimal (%): 99   SLEEP ARCHITECTURE The recording time for the entire night was 394.6 minutes.  During a baseline period of 146.7 minutes, the patient slept for 131.5 minutes in REM and nonREM, yielding a sleep efficiency of 89.7%. Sleep onset after lights out was 12.7 minutes with a REM latency of N/A minutes. The patient spent 4.6% of the night in stage N1 sleep, 95.4% in stage N2 sleep, 0.0% in stage N3 and 0% in REM.  During the titration period of 238.7 minutes, the patient slept for 232.7 minutes in REM and nonREM, yielding a sleep efficiency of 97.5%. Sleep onset after CPAP initiation was 0.0 minutes with a REM latency of 59.3 minutes. The patient spent 1.2% of the night in stage N1 sleep, 76.7% in stage N2 sleep, 0.0% in stage N3 and 22.1% in REM.  CARDIAC DATA The 2 lead EKG demonstrated sinus rhythm. The mean heart rate was 100.0 beats per minute. Other EKG findings include: PVCs.  LEG MOVEMENT DATA The total Periodic Limb Movements of Sleep (PLMS) were 0. The PLMS index was 0.0 .  IMPRESSIONS - Moderate obstructive sleep apnea occurred during the diagnostic portion of the study (AHI 21.9/h; RDI 23.7/h with absent REM and supine sleep).  CPAP was  initiated at 5 cm and was titrated to optimal PAP at 10 cm water pressure (AHI 0; O2 nadir 93%.) - No significant central sleep apnea occurred  during the diagnostic portion of the study (CAI  0.0/hour). - The patient had mild oxygen desaturation during the diagnostic portion of the study to a nadir of 84.0%. - The patient snored with moderate snoring volume during the diagnostic portion of the study. - EKG findings include PVCs. - Clinically significant periodic limb movements did not occur during sleep.  DIAGNOSIS - Obstructive Sleep Apnea (G47.33)  RECOMMENDATIONS - Recommend an initial trial of Auto-PAP with EPR of 3 at 10 - 14 cm of water with heated humidification. A large size [employer]kel Full Face Evora Full mask was used for the titration. - Effort should be made to optimize nasal and oropharyngeal patency. - Avoid alcohol, sedatives and other CNS depressants that may worsen sleep apnea and disrupt normal sleep architecture. - Sleep hygiene should be reviewed to assess factors that may improve sleep quality. - Weight management and regular exercise should be initiated or continued. - Recommend a download and sleep clinic evaluation after 4 weeks of therapy.  [Electronically signed] 09/13/2022 11:08 AM  Shelva Majestic MD, [facility], [place], American Board of Sleep Medicine  NPI: [id number]  [place] PH: [phone]   FX: [phone] [place]

## 2022-09-13 NOTE — ED Triage Notes (Signed)
Pt arrives from home via EMS c/o Dizziness, hypotension/ BP 70/40 Pt recedntly off of heart monitor, pt with hx of syncopal events and a-fib. Pt taking new medication Trilicidine.
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
## 2022-09-14 ENCOUNTER — Inpatient Hospital Stay (HOSPITAL_COMMUNITY): Payer: Medicare HMO

## 2022-09-14 ENCOUNTER — Encounter (HOSPITAL_COMMUNITY): Payer: Self-pay | Admitting: Family Medicine

## 2022-09-14 DIAGNOSIS — I10 Essential (primary) hypertension: Secondary | ICD-10-CM | POA: Diagnosis not present

## 2022-09-14 DIAGNOSIS — N179 Acute kidney failure, unspecified: Secondary | ICD-10-CM | POA: Diagnosis not present

## 2022-09-14 LAB — URINALYSIS, COMPLETE (UACMP) WITH MICROSCOPIC
Bilirubin Urine: NEGATIVE
Glucose, UA: NEGATIVE mg/dL
Hgb urine dipstick: NEGATIVE
Ketones, ur: 5 mg/dL — AB
Leukocytes,Ua: NEGATIVE
Nitrite: NEGATIVE
Protein, ur: 30 mg/dL — AB
Specific Gravity, Urine: 1.024 (ref 1.005–1.030)
pH: 5 (ref 5.0–8.0)

## 2022-09-14 LAB — CBG MONITORING, ED
Glucose-Capillary: 147 mg/dL — ABNORMAL HIGH (ref 70–99)
Glucose-Capillary: 189 mg/dL — ABNORMAL HIGH (ref 70–99)
Glucose-Capillary: 204 mg/dL — ABNORMAL HIGH (ref 70–99)

## 2022-09-14 LAB — BASIC METABOLIC PANEL
Anion gap: 9 (ref 5–15)
BUN: 40 mg/dL — ABNORMAL HIGH (ref 8–23)
CO2: 23 mmol/L (ref 22–32)
Calcium: 9.3 mg/dL (ref 8.9–10.3)
Chloride: 105 mmol/L (ref 98–111)
Creatinine, Ser: 2.01 mg/dL — ABNORMAL HIGH (ref 0.61–1.24)
GFR, Estimated: 35 mL/min — ABNORMAL LOW (ref >60)
Glucose, Bld: 158 mg/dL — ABNORMAL HIGH (ref 70–99)
Potassium: 4.1 mmol/L (ref 3.5–5.1)
Sodium: 137 mmol/L (ref 135–145)

## 2022-09-14 LAB — SODIUM, URINE, RANDOM: Sodium, Ur: 31 mmol/L

## 2022-09-14 LAB — CBC
HCT: 32.1 % — ABNORMAL LOW (ref 39.0–52.0)
Hemoglobin: 10.2 g/dL — ABNORMAL LOW (ref 13.0–17.0)
MCH: 26.8 pg (ref 26.0–34.0)
MCHC: 31.8 g/dL (ref 30.0–36.0)
MCV: 84.5 fL (ref 80.0–100.0)
Platelets: 368 10*3/uL (ref 150–400)
RBC: 3.8 MIL/uL — ABNORMAL LOW (ref 4.22–5.81)
RDW: 15.2 % (ref 11.5–15.5)
WBC: 9.6 10*3/uL (ref 4.0–10.5)
nRBC: 0 % (ref 0.0–0.2)

## 2022-09-14 LAB — GLUCOSE, CAPILLARY
Glucose-Capillary: 200 mg/dL — ABNORMAL HIGH (ref 70–99)
Glucose-Capillary: 159 mg/dL — ABNORMAL HIGH (ref 70–99)

## 2022-09-14 LAB — CREATININE, URINE, RANDOM: Creatinine, Urine: 483 mg/dL

## 2022-09-14 MED ORDER — ONDANSETRON HCL 4 MG/2ML IJ SOLN: 4.0000 mg | Freq: Four times a day (QID) | INTRAMUSCULAR | Status: DC | PRN

## 2022-09-14 MED ORDER — ONDANSETRON HCL 4 MG PO TABS: 4.0000 mg | ORAL_TABLET | Freq: Four times a day (QID) | ORAL | Status: DC | PRN

## 2022-09-14 MED ORDER — SODIUM CHLORIDE 0.9% FLUSH
3.0000 mL | Freq: Two times a day (BID) | INTRAVENOUS | Status: DC
  Administered 2022-09-14 (×3): 3 mL via INTRAVENOUS

## 2022-09-14 MED ORDER — OXYCODONE HCL 5 MG PO TABS: 5.0000 mg | ORAL_TABLET | ORAL | Status: DC | PRN

## 2022-09-14 MED ORDER — SENNOSIDES-DOCUSATE SODIUM 8.6-50 MG PO TABS: 1.0000 | ORAL_TABLET | Freq: Every evening | ORAL | Status: DC | PRN

## 2022-09-14 MED ORDER — CYCLOBENZAPRINE HCL 10 MG PO TABS
10.0000 mg | ORAL_TABLET | Freq: Three times a day (TID) | ORAL | Status: DC | PRN
  Administered 2022-09-14: 10 mg via ORAL
  Filled 2022-09-14: qty 1

## 2022-09-14 MED ORDER — GABAPENTIN 300 MG PO CAPS
300.0000 mg | ORAL_CAPSULE | Freq: Four times a day (QID) | ORAL | Status: DC
  Administered 2022-09-14 – 2022-09-15 (×3): 300 mg via ORAL
  Filled 2022-09-14 (×3): qty 1

## 2022-09-14 MED ORDER — ASPIRIN 81 MG PO TBEC
81.0000 mg | DELAYED_RELEASE_TABLET | Freq: Every day | ORAL | Status: DC
  Administered 2022-09-14 – 2022-09-15 (×2): 81 mg via ORAL
  Filled 2022-09-14 (×2): qty 1

## 2022-09-14 MED ORDER — INSULIN ASPART 100 UNIT/ML IJ SOLN
0.0000 [IU] | Freq: Three times a day (TID) | INTRAMUSCULAR | Status: DC
  Administered 2022-09-14: 1 [IU] via SUBCUTANEOUS
  Administered 2022-09-14 – 2022-09-15 (×2): 2 [IU] via SUBCUTANEOUS

## 2022-09-14 MED ORDER — ACETAMINOPHEN 650 MG RE SUPP: 650.0000 mg | Freq: Four times a day (QID) | RECTAL | Status: DC | PRN

## 2022-09-14 MED ORDER — SODIUM CHLORIDE 0.9 % IV SOLN: INTRAVENOUS | Status: DC

## 2022-09-14 MED ORDER — HEPARIN SODIUM (PORCINE) 5000 UNIT/ML IJ SOLN
5000.0000 [IU] | Freq: Three times a day (TID) | INTRAMUSCULAR | Status: DC
  Administered 2022-09-14 – 2022-09-15 (×3): 5000 [IU] via SUBCUTANEOUS
  Filled 2022-09-14 (×3): qty 1

## 2022-09-14 MED ORDER — INSULIN ASPART 100 UNIT/ML IJ SOLN: 0.0000 [IU] | Freq: Every day | INTRAMUSCULAR | Status: DC

## 2022-09-14 MED ORDER — FENTANYL CITRATE PF 50 MCG/ML IJ SOSY: 25.0000 ug | PREFILLED_SYRINGE | INTRAMUSCULAR | Status: DC | PRN

## 2022-09-14 MED ORDER — SIMVASTATIN 20 MG PO TABS
20.0000 mg | ORAL_TABLET | Freq: Every evening | ORAL | Status: DC
  Administered 2022-09-14: 20 mg via ORAL
  Filled 2022-09-14: qty 1

## 2022-09-14 MED ORDER — ACETAMINOPHEN 325 MG PO TABS
650.0000 mg | ORAL_TABLET | Freq: Four times a day (QID) | ORAL | Status: DC | PRN
  Administered 2022-09-14 – 2022-09-15 (×3): 650 mg via ORAL
  Filled 2022-09-14 (×3): qty 2

## 2022-09-14 MED ORDER — HYDRALAZINE HCL 20 MG/ML IJ SOLN: 5.0000 mg | Freq: Four times a day (QID) | INTRAMUSCULAR | Status: DC | PRN

## 2022-09-14 MED ORDER — INSULIN GLARGINE-YFGN 100 UNIT/ML ~~LOC~~ SOLN
10.0000 [IU] | Freq: Every day | SUBCUTANEOUS | Status: DC
  Administered 2022-09-14 – 2022-09-15 (×2): 10 [IU] via SUBCUTANEOUS
  Filled 2022-09-14 (×2): qty 0.1
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
## 2022-09-14 NOTE — Progress Notes (Signed)
PROGRESS NOTE    Timothy Peters  JQZ:[id number] DOB: 04/12/1951 DOA: 09/13/2022 PCP: Wenda Low, MD   Chief Complaint  Patient presents with   Dizziness   Hypotension    Brief Narrative:    Timothy Peters is a 72 y.o. male with medical history significant for type 2 diabetes mellitus, hypertension, hyperlipidemia, hepatitis C treated in 2019, and recurrent syncopal episodes who presents to the emergency department with lightheadedness.   Patient reports that he took all of his medications this morning but did not eat anything as he was busy packing boxes for a move.  He began to feel acutely lightheaded while standing and called out to his wife who noted him to be diaphoretic.  He felt a little bit better after eating something but continued to have some lightheadedness.  Blood pressure was noted to be 70/40.  He had just removed his Zio patch earlier in the day which she had been wearing at the direction of cardiology for evaluation of recurrent syncopal episodes.  He denies any recent chest pain or palpitations, and denies any loss of consciousness today.  He denies dysuria, fever, chills, nausea, vomiting, or diarrhea.  He has poor appetite and has not been eating much, but this has been going on for over a year per report of his wife.   ED Course: Upon arrival to the ED, patient is found to be afebrile and saturating well on room air with normal heart rate and systolic blood pressure of 91 and greater.  EKG demonstrates sinus rhythm with LVH.  Chest x-ray is negative for acute cardiopulmonary disease.  Blood work notable for BUN 38 and creatinine 2.38.  Assessment & Plan:   Principal Problem:   AKI (acute kidney injury) ([place]) Active Problems:   HTN (hypertension)   Uncontrolled type 2 diabetes mellitus with hyperglycemia, with long-term current use of insulin (HCC)   Chronic low back pain    AKI  - SCr is 2.38 on admission, up from 0.87 two months ago  - Most likely  prerenal azotemia in setting of poor appetite and hypotension  -Hold antihypertensive medications . -check renal Ultrasound . -Continue with IV fluids    Near-syncope  - He became acutely lightheaded on standing today without LOC  - He recently had negative syncope w/u in the hospital, saw cardiology after discharge, and was given Zio patch which he just removed yesterday - Orthostatic vitals negative in ED  - Continue cardiac monitoring, fall precautions     Hypertension  - He was hypotensive prior to arrival in ED where he has had low-normal BPs  - Hold antihypertensives initially    Insulin-dependent DM  - A1c was 9.6% in November 2023  - Check CBGs and continue insulin       DVT prophylaxis: Heparin Code Status: Full Family Communication: None at bedside Disposition:   Status is: Inpatient    Consultants:  None   Subjective:  He denies any chest pain or shortness of breath, reports generalized weakness and fatigue. Objective: Vitals:   09/14/22 0559 09/14/22 0645 09/14/22 0943 09/14/22 0951  BP:  (!) 145/80  (!) 162/95  Pulse:  65  70  Resp:  10  19  Temp: 98.1 F (36.7 C)  98 F (36.7 C)   TempSrc: Oral  Oral   SpO2:  100%  100%  Weight:      Height:        Intake/Output Summary (Last 24 hours) at 09/14/2022 1126 Last  data filed at 09/14/2022 0047 Gross per 24 hour  Intake 425 ml  Output --  Net 425 ml   Filed Weights   09/13/22 2055  Weight: 89.8 kg    Examination:  Awake Alert, Oriented X 3, No new F.N deficits, Normal affect Symmetrical Chest wall movement, Good air movement bilaterally, CTAB RRR,No Gallops,Rubs or new Murmurs, No Parasternal Heave +ve B.Sounds, Abd Soft, No tenderness, No rebound - guarding or rigidity. No Cyanosis, Clubbing or edema, No new Rash or bruise       Data Reviewed: I have personally reviewed following labs and imaging studies  CBC: Recent Labs  Lab 09/13/22 2111 09/14/22 0425  WBC 10.4 9.6  NEUTROABS  5.1  --   HGB 10.3* 10.2*  HCT 33.8* 32.1*  MCV 87.8 84.5  PLT 364 161    Basic Metabolic Panel: Recent Labs  Lab 09/13/22 2111 09/14/22 0425  NA 137 137  K 4.2 4.1  CL 104 105  CO2 21* 23  GLUCOSE 183* 158*  BUN 38* 40*  CREATININE 2.38* 2.01*  CALCIUM 9.4 9.3    GFR: Estimated Creatinine Clearance: 36.4 mL/min (A) (by C-G formula based on SCr of 2.01 mg/dL (H)).  Liver Function Tests: Recent Labs  Lab 09/13/22 2111  AST 17  ALT 9  ALKPHOS 72  BILITOT 0.5  PROT 7.1  ALBUMIN 3.6    CBG: Recent Labs  Lab 09/13/22 2109 09/14/22 0033 09/14/22 0806  GLUCAP 164* 189* 147*     No results found for this or any previous visit (from the past 240 hour(s)).       Radiology Studies: US RENAL  Result Date: 09/14/2022 CLINICAL DATA:  Initial evaluation for acute kidney injury. EXAM: RENAL / URINARY TRACT ULTRASOUND COMPLETE COMPARISON:  Prior CT from 12/11/2020. FINDINGS: Right Kidney: Renal measurements: 11.0 x 6.2 x 5.6 cm = volume: 200.0 mL. Renal echogenicity within normal limits. No nephrolithiasis or hydronephrosis. No focal renal mass. Left Kidney: Renal measurements: 11.0 x 7.0 x 6.8 cm = volume: 278.0 mL. Renal echogenicity within normal limits. 6 mm nonobstructive stone present at the lower pole. No hydronephrosis. No focal renal mass. Bladder: Not evaluated on this exam due to patient condition. Other: None. IMPRESSION: 1. 6 mm nonobstructive stone at the lower pole of the left kidney. 2. Otherwise unremarkable and normal renal ultrasound. No hydronephrosis. Electronically Signed   By: Jeannine Boga M.D.   On: 09/14/2022 02:14   DG Chest Port 1 View  Result Date: 09/13/2022 CLINICAL DATA:  Near syncope EXAM: PORTABLE CHEST 1 VIEW COMPARISON:  07/08/2022 FINDINGS: The heart size and mediastinal contours are within normal limits. Both lungs are clear. The visualized skeletal structures are unremarkable. IMPRESSION: No active disease. Electronically Signed    By: Donavan Foil M.D.   On: 09/13/2022 21:55        Scheduled Meds:  aspirin EC  81 mg Oral Daily   heparin  5,000 Units Subcutaneous Q8H   insulin aspart  0-5 Units Subcutaneous QHS   insulin aspart  0-6 Units Subcutaneous TID WC   insulin glargine-yfgn  10 Units Subcutaneous Daily   simvastatin  20 mg Oral QPM   sodium chloride flush  3 mL Intravenous Q12H   Continuous Infusions:  sodium chloride 125 mL/hr at 09/14/22 0559     LOS: 1 day        Phillips Climes, MD [facility]   To contact the attending provider between 7A-7P or the covering provider during after  hours 7P-7A, please log into the web site [email or web address] and access using universal [place] password for that web site. If you do not have the password, please call the hospital operator.  09/14/2022, 11:26 AM

## 2022-09-14 NOTE — ED Notes (Signed)
Ultrasound at bedside
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
## 2022-09-15 ENCOUNTER — Inpatient Hospital Stay (HOSPITAL_COMMUNITY): Payer: Medicare HMO

## 2022-09-15 DIAGNOSIS — Z794 Long term (current) use of insulin: Secondary | ICD-10-CM | POA: Diagnosis not present

## 2022-09-15 DIAGNOSIS — E1165 Type 2 diabetes mellitus with hyperglycemia: Secondary | ICD-10-CM | POA: Diagnosis not present

## 2022-09-15 DIAGNOSIS — N179 Acute kidney failure, unspecified: Secondary | ICD-10-CM | POA: Diagnosis not present

## 2022-09-15 LAB — BASIC METABOLIC PANEL
Anion gap: 9 (ref 5–15)
BUN: 31 mg/dL — ABNORMAL HIGH (ref 8–23)
CO2: 22 mmol/L (ref 22–32)
Calcium: 9.2 mg/dL (ref 8.9–10.3)
Chloride: 106 mmol/L (ref 98–111)
Creatinine, Ser: 1.17 mg/dL (ref 0.61–1.24)
GFR, Estimated: >60 mL/min (ref >60)
Glucose, Bld: 145 mg/dL — ABNORMAL HIGH (ref 70–99)
Potassium: 4.1 mmol/L (ref 3.5–5.1)
Sodium: 137 mmol/L (ref 135–145)

## 2022-09-15 LAB — GLUCOSE, CAPILLARY
Glucose-Capillary: 140 mg/dL — ABNORMAL HIGH (ref 70–99)
Glucose-Capillary: 201 mg/dL — ABNORMAL HIGH (ref 70–99)

## 2022-09-15 LAB — CBC
HCT: 31.8 % — ABNORMAL LOW (ref 39.0–52.0)
Hemoglobin: 10.3 g/dL — ABNORMAL LOW (ref 13.0–17.0)
MCH: 27.6 pg (ref 26.0–34.0)
MCHC: 32.4 g/dL (ref 30.0–36.0)
MCV: 85.3 fL (ref 80.0–100.0)
Platelets: 337 10*3/uL (ref 150–400)
RBC: 3.73 MIL/uL — ABNORMAL LOW (ref 4.22–5.81)
RDW: 15.2 % (ref 11.5–15.5)
WBC: 6.8 10*3/uL (ref 4.0–10.5)
nRBC: 0 % (ref 0.0–0.2)

## 2022-09-15 LAB — MAGNESIUM: Magnesium: 1.9 mg/dL (ref 1.7–2.4)

## 2022-09-15 MED ORDER — LOSARTAN POTASSIUM 50 MG PO TABS
50.0000 mg | ORAL_TABLET | Freq: Every day | ORAL | 0 refills | Status: DC
  Filled 2022-09-15: qty 30, 30d supply, fill #0

## 2022-09-15 MED ORDER — METOPROLOL SUCCINATE ER 25 MG PO TB24
12.5000 mg | ORAL_TABLET | Freq: Every day | ORAL | Status: DC
  Administered 2022-09-15: 12.5 mg via ORAL
  Filled 2022-09-15: qty 1

## 2022-09-15 MED ORDER — LOSARTAN POTASSIUM 25 MG PO TABS
25.0000 mg | ORAL_TABLET | Freq: Every day | ORAL | Status: DC
  Administered 2022-09-15: 25 mg via ORAL

## 2022-09-15 MED ORDER — LOSARTAN POTASSIUM 50 MG PO TABS
50.0000 mg | ORAL_TABLET | Freq: Every day | ORAL | Status: DC
  Filled 2022-09-15: qty 1

## 2022-09-15 MED ORDER — METOPROLOL SUCCINATE ER 25 MG PO TB24: 12.5000 mg | ORAL_TABLET | Freq: Every day | ORAL | 3 refills | Status: DC

## 2022-09-15 NOTE — Progress Notes (Signed)
Discharge teaching complete. Meds, diet, activity, follow up appointments reviewed and all questions answered. Copy of instructions given to patient and prescriptions sent to pharmacy. Patient's wife is coming to pick up patient.

## 2022-09-15 NOTE — Discharge Summary (Signed)
Physician Discharge Summary  JAVARIOUS ELSAYED ZOX:[id number] DOB: 01-23-1951 DOA: 09/13/2022  PCP: Wenda Low, MD  Admit date: 09/13/2022 Discharge date: 09/15/2022  Admitted From: (Home) Disposition:  (Home )  Recommendations for Outpatient Follow-up:  Follow up with PCP in 1-2 weeks Please obtain BMP/CBC in one week Adjust antihypertensive regimen as needed hydrochlorothiazide has been discontinued, and both metoprolol and starting hide been decreased by 50%, medication need to be adjusted if blood pressure started to increase Trulicity has been discontinued at time of discharge, may need to adjust other diabetic agents if needed   Discharge Condition: (Stable) CODE STATUS: (FULL) Diet recommendation: Heart Healthy / Carb Modified   Brief/Interim Summary:   Timothy Peters is a 72 y.o. male with medical history significant for type 2 diabetes mellitus, hypertension, hyperlipidemia, hepatitis C treated in 2019, and recurrent syncopal episodes who presents to the emergency department with lightheadedness.   Patient reports that he took all of his medications this morning but did not eat anything as he was busy packing boxes for a move.  He began to feel acutely lightheaded while standing and called out to his wife who noted him to be diaphoretic.  He felt a little bit better after eating something but continued to have some lightheadedness.  Blood pressure was noted to be 70/40.  He had just removed his Zio patch earlier in the day which she had been wearing at the direction of cardiology for evaluation of recurrent syncopal episodes.  He denies any recent chest pain or palpitations, and denies any loss of consciousness today.  He denies dysuria, fever, chills, nausea, vomiting, or diarrhea.  He has poor appetite and has not been eating much, but this has been going on for over a year per report of his wife.   In  ED, patient is found to be afebrile and saturating well on room air with  normal heart rate and systolic blood pressure of 91 and greater.  EKG demonstrates sinus rhythm with LVH.  Chest x-ray is negative for acute cardiopulmonary disease.  Blood work notable for BUN 38 and creatinine 2.38.  As well he was noted to be orthostatic, antihypertensive medications has been held, and he was started on IV fluids, has much improved, this morning he remained mildly orthostatic, but asymptomatic, blood pressure medication has been resumed at a lower dose and adjustment (please see discussion below) he ambulated multiple times in the hallway after that with no symptoms.     AKI  - SCr is 2.38 on admission, up from 0.87 two months ago ,Most likely prerenal azotemia in setting of poor appetite and hypotension . -Antihypertensive medication has been held during hospital stay. -Renal ultrasound were obtained, they were significant for 6 mm nonobstructive stone of left kidney, but otherwise unremarkable, no hydronephrosis -Function has improved with IV fluids, it is 1.17 on discharge   Near-syncope  Orthostasis - He became acutely lightheaded on standing without LOC , He recently had negative syncope w/u in the hospital, saw cardiology after discharge, and was given Zio patch which he just removed yesterday before admission, he was monitored on telemetry during hospital stay with no evidence of any bradycardias or pauses, or heart block, or any malignant arrhythmias -It was noted to have soft blood pressure with orthostatics initially, this appears to be multifactorial's, likely due to his antihypertensive medications, but as well he reports very poor oral intake and fluid intake since he started taking Trulicity few months ago, he was clinically  dehydrated, meds has been held, he received IV fluids, this morning he was mildly orthostatic by readings, but he had no symptoms, his antihypertensive medication has been adjusted, I have stopped his diuretics hydrochlorothiazide, and decreased his  losartan dose from 100 mg to 50 mg, and Toprol-XL has been decreased from 25 mg to 12.5 mg oral daily. -Dose was given at time of discharge   Hypertension  -Please see above discussion   Insulin-dependent DM  - A1c was 9.6% in November 3729  -Trulicity has been discontinued as likely contributing to poor oral intake, contributing to above symptoms.    Discharge Diagnoses:  Principal Problem:   AKI (acute kidney injury) ([place]) Active Problems:   HTN (hypertension)   Uncontrolled type 2 diabetes mellitus with hyperglycemia, with long-term current use of insulin (HCC)   Chronic low back pain    Discharge Instructions  Discharge Instructions     Diet - low sodium heart healthy   Complete by: As directed    Discharge instructions   Complete by: As directed    Follow with Primary MD Wenda Low, MD in 7 days   Get CBC, CMP,  checked  by Primary MD next visit.    Activity: As tolerated with Full fall precautions use walker/cane & assistance as needed   Disposition Home    Diet: Heart Healthy/carb modified   On your next visit with your primary care physician please Get Medicines reviewed and adjusted.   Please request your Prim.MD to go over all Hospital Tests and Procedure/Radiological results at the follow up, please get all Hospital records sent to your Prim MD by signing hospital release before you go home.   If you experience worsening of your admission symptoms, develop shortness of breath, life threatening emergency, suicidal or homicidal thoughts you must seek medical attention immediately by calling 911 or calling your MD immediately  if symptoms less severe.  You Must read complete instructions/literature along with all the possible adverse reactions/side effects for all the Medicines you take and that have been prescribed to you. Take any new Medicines after you have completely understood and accpet all the possible adverse reactions/side effects.   Do not  drive, operating heavy machinery, perform activities at heights, swimming or participation in water activities or provide baby sitting services if your were admitted for syncope or siezures until you have seen by Primary MD or a Neurologist and advised to do so again.  Do not drive when taking Pain medications.    Do not take more than prescribed Pain, Sleep and Anxiety Medications  Special Instructions: If you have smoked or chewed Tobacco  in the last 2 yrs please stop smoking, stop any regular Alcohol  and or any Recreational drug use.  Wear Seat belts while driving.   Please note  You were cared for by a hospitalist during your hospital stay. If you have any questions about your discharge medications or the care you received while you were in the hospital after you are discharged, you can call the unit and asked to speak with the hospitalist on call if the hospitalist that took care of you is not available. Once you are discharged, your primary care physician will handle any further medical issues. Please note that NO REFILLS for any discharge medications will be authorized once you are discharged, as it is imperative that you return to your primary care physician (or establish a relationship with a primary care physician if you do not have one) for  your aftercare needs so that they can reassess your need for medications and monitor your lab values.   Increase activity slowly   Complete by: As directed       Allergies as of 09/15/2022   No Known Allergies      Medication List     STOP taking these medications    HYDROcodone-acetaminophen 10-325 MG tablet Commonly known as: NORCO   ibuprofen 200 MG tablet Commonly known as: ADVIL   losartan-hydrochlorothiazide 100-12.5 MG tablet Commonly known as: HYZAAR   sildenafil 100 MG tablet Commonly known as: VIAGRA   Trulicity 1.5 KG/2.5KY Sopn Generic drug: Dulaglutide       TAKE these medications    Accu-Chek Guide  w/Device Kit Use as directed up to 4 times daily   Accu-Chek Softclix Lancets lancets Test up to 4 times a day as directed   aspirin 81 MG tablet Take 1 tablet (81 mg total) by mouth daily. Resume 4 days post-op What changed: additional instructions   B-D UF III MINI PEN NEEDLES 31G X 5 MM Misc Generic drug: Insulin Pen Needle Use with lantus daily.   Basaglar KwikPen 100 UNIT/ML Inject 15 units subcutaneously once a day What changed:  how much to take when to take this   cyclobenzaprine 10 MG tablet Commonly known as: FLEXERIL Take 1 tablet (10 mg total) by mouth 3 (three) times daily as needed for muscle spasms.   docusate sodium 100 MG capsule Commonly known as: COLACE Take 1 capsule (100 mg total) by mouth 2 (two) times daily.   DULoxetine 60 MG capsule Commonly known as: CYMBALTA Take 1 capsule (60 mg total) by mouth daily.   FreeStyle Martha 2 [employer] as directed   [employer] 2 Sensor Misc Change sensor every 14 (fourteen) days.   gabapentin 300 MG capsule Commonly known as: NEURONTIN Take 1 capsule (300 mg total) by mouth 4 (four) times daily.   glipiZIDE 5 MG tablet Commonly known as: GLUCOTROL Take 1 tablet (5 mg total) by mouth daily as needed 30 minutes before breakfast What changed: reasons to take this   glucose blood test strip Use as instructed   Accu-Chek Guide test strip Generic drug: glucose blood Test up to 4 times daily as directed   Accu-Chek Guide test strip Generic drug: glucose blood Use to check blood sugar once daily.   IRON PO Take 1 tablet by mouth daily.   losartan 50 MG tablet Commonly known as: Cozaar Take 1 tablet (50 mg total) by mouth daily.   metFORMIN 1000 MG tablet Commonly known as: GLUCOPHAGE Take 1 tablet (1,000 mg total) by mouth 2 (two) times daily with a meal.   metoprolol succinate 25 MG 24 hr tablet Commonly known as: TOPROL-XL Take 0.5 tablets (12.5 mg total) by mouth daily. What changed:  how much to take   simvastatin 20 MG tablet Commonly known as: ZOCOR Take 1 tablet (20 mg total) by mouth every evening.        No Known Allergies  Consultations: None   Procedures/Studies: CT HEAD WO CONTRAST (5MM)  Result Date: 09/15/2022 CLINICAL DATA:  72 year old male with near syncope. EXAM: CT HEAD WITHOUT CONTRAST TECHNIQUE: Contiguous axial images were obtained from the base of the skull through the vertex without intravenous contrast. RADIATION DOSE REDUCTION: This exam was performed according to the departmental dose-optimization program which includes automated exposure control, adjustment of the mA and/or kV according to patient size and/or use of iterative reconstruction technique. COMPARISON:  Head CT 07/08/2022.  Brain MRI 05/15/2020. FINDINGS: Brain: Cerebral volume is within normal limits for age. No midline shift, ventriculomegaly, mass effect, evidence of mass lesion, intracranial hemorrhage or evidence of cortically based acute infarction. Gray-white differentiation appears stable and within normal limits for age. Vascular: Calcified atherosclerosis at the skull base. No suspicious intracranial vascular hyperdensity. Skull: Negative. Sinuses/Orbits: Visualized paranasal sinuses and mastoids are stable and well aerated. Other: No acute orbit or scalp soft tissue finding. IMPRESSION: Stable and normal for age noncontrast Head CT. Electronically Signed   By: Genevie Ann M.D.   On: 09/15/2022 10:25   US RENAL  Result Date: 09/14/2022 CLINICAL DATA:  Initial evaluation for acute kidney injury. EXAM: RENAL / URINARY TRACT ULTRASOUND COMPLETE COMPARISON:  Prior CT from 12/11/2020. FINDINGS: Right Kidney: Renal measurements: 11.0 x 6.2 x 5.6 cm = volume: 200.0 mL. Renal echogenicity within normal limits. No nephrolithiasis or hydronephrosis. No focal renal mass. Left Kidney: Renal measurements: 11.0 x 7.0 x 6.8 cm = volume: 278.0 mL. Renal echogenicity within normal limits. 6 mm  nonobstructive stone present at the lower pole. No hydronephrosis. No focal renal mass. Bladder: Not evaluated on this exam due to patient condition. Other: None. IMPRESSION: 1. 6 mm nonobstructive stone at the lower pole of the left kidney. 2. Otherwise unremarkable and normal renal ultrasound. No hydronephrosis. Electronically Signed   By: Jeannine Boga M.D.   On: 09/14/2022 02:14   DG Chest Port 1 View  Result Date: 09/13/2022 CLINICAL DATA:  Near syncope EXAM: PORTABLE CHEST 1 VIEW COMPARISON:  07/08/2022 FINDINGS: The heart size and mediastinal contours are within normal limits. Both lungs are clear. The visualized skeletal structures are unremarkable. IMPRESSION: No active disease. Electronically Signed   By: Donavan Foil M.D.   On: 09/13/2022 21:55   SLEEP STUDY DOCUMENTS  Result Date: 09/03/2022 Ordered by an unspecified provider.     Subjective: Ambulated multiple time back and forth in the hallway today, with no dizziness, lightheadedness, he felt fine  Discharge Exam: Vitals:   09/15/22 0928 09/15/22 1126  BP: (!) 163/67 (!) 161/90  Pulse: 83   Resp: 18   Temp: 98.2 F (36.8 C)   SpO2: 98%    Vitals:   09/15/22 0500 09/15/22 0808 09/15/22 0928 09/15/22 1126  BP:   (!) 163/67 (!) 161/90  Pulse:   83   Resp:  18 18   Temp:   98.2 F (36.8 C)   TempSrc:   Oral   SpO2:  100% 98%   Weight: 91.5 kg     Height:        General: Pt is alert, awake, not in acute distress Cardiovascular: RRR, S1/S2 +, no rubs, no gallops Respiratory: CTA bilaterally, no wheezing, no rhonchi Abdominal: Soft, NT, ND, bowel sounds + Extremities: no edema, no cyanosis    The results of significant diagnostics from this hospitalization (including imaging, microbiology, ancillary and laboratory) are listed below for reference.     Microbiology: No results found for this or any previous visit (from the past 240 hour(s)).   Labs: BNP (last 3 results) No results for input(s):  "BNP" in the last 8760 hours. Basic Metabolic Panel: Recent Labs  Lab 09/13/22 2111 09/14/22 0425 09/15/22 0227  NA 137 137 137  K 4.2 4.1 4.1  CL 104 105 106  CO2 21* 23 22  GLUCOSE 183* 158* 145*  BUN 38* 40* 31*  CREATININE 2.38* 2.01* 1.17  CALCIUM 9.4 9.3 9.2  MG  --   --  1.9   Liver Function Tests: Recent Labs  Lab 09/13/22 2111  AST 17  ALT 9  ALKPHOS 72  BILITOT 0.5  PROT 7.1  ALBUMIN 3.6   No results for input(s): "LIPASE", "AMYLASE" in the last 168 hours. No results for input(s): "AMMONIA" in the last 168 hours. CBC: Recent Labs  Lab 09/13/22 2111 09/14/22 0425 09/15/22 0227  WBC 10.4 9.6 6.8  NEUTROABS 5.1  --   --   HGB 10.3* 10.2* 10.3*  HCT 33.8* 32.1* 31.8*  MCV 87.8 84.5 85.3  PLT 364 368 337   Cardiac Enzymes: No results for input(s): "CKTOTAL", "CKMB", "CKMBINDEX", "TROPONINI" in the last 168 hours. BNP: Invalid input(s): "POCBNP" CBG: Recent Labs  Lab 09/14/22 1146 09/14/22 1710 09/14/22 2115 09/15/22 0923 09/15/22 1202  GLUCAP 204* 200* 159* 140* 201*   D-Dimer No results for input(s): "DDIMER" in the last 72 hours. Hgb A1c No results for input(s): "HGBA1C" in the last 72 hours. Lipid Profile No results for input(s): "CHOL", "HDL", "LDLCALC", "TRIG", "CHOLHDL", "LDLDIRECT" in the last 72 hours. Thyroid function studies No results for input(s): "TSH", "T4TOTAL", "T3FREE", "THYROIDAB" in the last 72 hours.  Invalid input(s): "FREET3" Anemia work up No results for input(s): "VITAMINB12", "FOLATE", "FERRITIN", "TIBC", "IRON", "RETICCTPCT" in the last 72 hours. Urinalysis    Component Value Date/Time   COLORURINE AMBER (A) 09/14/2022 0216   APPEARANCEUR HAZY (A) 09/14/2022 0216   LABSPEC 1.024 09/14/2022 0216   PHURINE 5.0 09/14/2022 0216   GLUCOSEU NEGATIVE 09/14/2022 0216   HGBUR NEGATIVE 09/14/2022 0216   BILIRUBINUR NEGATIVE 09/14/2022 0216   KETONESUR 5 (A) 09/14/2022 0216   PROTEINUR 30 (A) 09/14/2022 0216    NITRITE NEGATIVE 09/14/2022 0216   LEUKOCYTESUR NEGATIVE 09/14/2022 0216   Sepsis Labs Recent Labs  Lab 09/13/22 2111 09/14/22 0425 09/15/22 0227  WBC 10.4 9.6 6.8   Microbiology No results found for this or any previous visit (from the past 240 hour(s)).   Time coordinating discharge: Over 30 minutes  SIGNED:   Phillips Climes, MD  [facility] 09/15/2022, 1:17 PM Pager   If 7PM-7AM, please contact night-coverage [email or web address]

## 2022-09-15 NOTE — Discharge Instructions (Signed)
Follow with Primary MD Wenda Low, MD in 7 days   Get CBC, CMP,  checked  by Primary MD next visit.    Activity: As tolerated with Full fall precautions use walker/cane & assistance as needed   Disposition Home    Diet: Heart Healthy/carb modified   On your next visit with your primary care physician please Get Medicines reviewed and adjusted.   Please request your Prim.MD to go over all Hospital Tests and Procedure/Radiological results at the follow up, please get all Hospital records sent to your Prim MD by signing hospital release before you go home.   If you experience worsening of your admission symptoms, develop shortness of breath, life threatening emergency, suicidal or homicidal thoughts you must seek medical attention immediately by calling 911 or calling your MD immediately  if symptoms less severe.  You Must read complete instructions/literature along with all the possible adverse reactions/side effects for all the Medicines you take and that have been prescribed to you. Take any new Medicines after you have completely understood and accpet all the possible adverse reactions/side effects.   Do not drive, operating heavy machinery, perform activities at heights, swimming or participation in water activities or provide baby sitting services if your were admitted for syncope or siezures until you have seen by Primary MD or a Neurologist and advised to do so again.  Do not drive when taking Pain medications.    Do not take more than prescribed Pain, Sleep and Anxiety Medications  Special Instructions: If you have smoked or chewed Tobacco  in the last 2 yrs please stop smoking, stop any regular Alcohol  and or any Recreational drug use.  Wear Seat belts while driving.   Please note  You were cared for by a hospitalist during your hospital stay. If you have any questions about your discharge medications or the care you received while you were in the hospital after you  are discharged, you can call the unit and asked to speak with the hospitalist on call if the hospitalist that took care of you is not available. Once you are discharged, your primary care physician will handle any further medical issues. Please note that NO REFILLS for any discharge medications will be authorized once you are discharged, as it is imperative that you return to your primary care physician (or establish a relationship with a primary care physician if you do not have one) for your aftercare needs so that they can reassess your need for medications and monitor your lab values.

## 2022-09-16 ENCOUNTER — Ambulatory Visit: Payer: Medicare HMO

## 2022-09-16 ENCOUNTER — Other Ambulatory Visit (HOSPITAL_BASED_OUTPATIENT_CLINIC_OR_DEPARTMENT_OTHER): Payer: Self-pay

## 2022-09-16 ENCOUNTER — Other Ambulatory Visit: Payer: Self-pay

## 2022-09-17 LAB — PATHOLOGIST SMEAR REVIEW
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
## 2022-09-17 NOTE — Telephone Encounter (Signed)
Left message to return a call to discuss sleep study results. 

## 2022-09-18 ENCOUNTER — Ambulatory Visit: Payer: Medicare HMO

## 2022-09-18 ENCOUNTER — Other Ambulatory Visit (HOSPITAL_COMMUNITY): Payer: Self-pay

## 2022-09-18 DIAGNOSIS — R55 Syncope and collapse: Secondary | ICD-10-CM | POA: Diagnosis not present

## 2022-09-19 ENCOUNTER — Other Ambulatory Visit (HOSPITAL_COMMUNITY): Payer: Self-pay

## 2022-09-24 ENCOUNTER — Other Ambulatory Visit (HOSPITAL_COMMUNITY): Payer: Self-pay

## 2022-09-24 DIAGNOSIS — Z794 Long term (current) use of insulin: Secondary | ICD-10-CM | POA: Diagnosis not present

## 2022-09-24 DIAGNOSIS — I700 Atherosclerosis of aorta: Secondary | ICD-10-CM | POA: Diagnosis not present

## 2022-09-24 DIAGNOSIS — E782 Mixed hyperlipidemia: Secondary | ICD-10-CM | POA: Diagnosis not present

## 2022-09-24 DIAGNOSIS — E1165 Type 2 diabetes mellitus with hyperglycemia: Secondary | ICD-10-CM | POA: Diagnosis not present

## 2022-09-24 DIAGNOSIS — I480 Paroxysmal atrial fibrillation: Secondary | ICD-10-CM | POA: Diagnosis not present

## 2022-09-24 DIAGNOSIS — M48061 Spinal stenosis, lumbar region without neurogenic claudication: Secondary | ICD-10-CM | POA: Diagnosis not present

## 2022-09-24 DIAGNOSIS — E1140 Type 2 diabetes mellitus with diabetic neuropathy, unspecified: Secondary | ICD-10-CM | POA: Diagnosis not present

## 2022-09-24 DIAGNOSIS — E1121 Type 2 diabetes mellitus with diabetic nephropathy: Secondary | ICD-10-CM | POA: Diagnosis not present

## 2022-09-24 DIAGNOSIS — E113313 Type 2 diabetes mellitus with moderate nonproliferative diabetic retinopathy with macular edema, bilateral: Secondary | ICD-10-CM | POA: Diagnosis not present

## 2022-09-24 DIAGNOSIS — I10 Essential (primary) hypertension: Secondary | ICD-10-CM | POA: Diagnosis not present

## 2022-09-24 DIAGNOSIS — J439 Emphysema, unspecified: Secondary | ICD-10-CM | POA: Diagnosis not present

## 2022-09-24 DIAGNOSIS — Z0000 Encounter for general adult medical examination without abnormal findings: Secondary | ICD-10-CM | POA: Diagnosis not present

## 2022-09-24 MED ORDER — DAPAGLIFLOZIN PROPANEDIOL 10 MG PO TABS
10.0000 mg | ORAL_TABLET | Freq: Every day | ORAL | 5 refills | Status: DC
  Filled 2022-09-24: qty 90, 90d supply, fill #0
  Filled 2023-01-28: qty 90, 90d supply, fill #1
  Filled 2023-04-01 – 2023-05-12 (×2): qty 90, 90d supply, fill #2
  Filled 2023-08-21 (×2): qty 90, 90d supply, fill #3

## 2022-09-25 ENCOUNTER — Other Ambulatory Visit (HOSPITAL_COMMUNITY): Payer: Self-pay

## 2022-09-25 MED ORDER — BASAGLAR KWIKPEN 100 UNIT/ML ~~LOC~~ SOPN
28.0000 [IU] | PEN_INJECTOR | Freq: Every day | SUBCUTANEOUS | 5 refills | Status: DC
  Filled 2022-09-25: qty 9, 30d supply, fill #0
  Filled 2022-11-01 – 2022-12-13 (×2): qty 9, 30d supply, fill #1

## 2022-10-03 DIAGNOSIS — H43813 Vitreous degeneration, bilateral: Secondary | ICD-10-CM | POA: Diagnosis not present

## 2022-10-03 DIAGNOSIS — E113313 Type 2 diabetes mellitus with moderate nonproliferative diabetic retinopathy with macular edema, bilateral: Secondary | ICD-10-CM | POA: Diagnosis not present

## 2022-10-03 DIAGNOSIS — H35033 Hypertensive retinopathy, bilateral: Secondary | ICD-10-CM | POA: Diagnosis not present

## 2022-10-04 ENCOUNTER — Ambulatory Visit: Payer: 59 | Admitting: Podiatry

## 2022-10-04 NOTE — Telephone Encounter (Signed)
Left a 3rd message to return a call to me to discuss sleep study results.

## 2022-10-04 NOTE — Telephone Encounter (Signed)
-----   Message from Troy Sine, MD sent at 09/13/2022 11:13 AM EST ----- Mariann Laster, please notify pt and set up with DME for CPAP

## 2022-10-07 ENCOUNTER — Ambulatory Visit (INDEPENDENT_AMBULATORY_CARE_PROVIDER_SITE_OTHER): Payer: 59 | Admitting: Podiatry

## 2022-10-07 ENCOUNTER — Telehealth: Payer: Self-pay | Admitting: *Deleted

## 2022-10-07 DIAGNOSIS — M79674 Pain in right toe(s): Secondary | ICD-10-CM

## 2022-10-07 DIAGNOSIS — M79675 Pain in left toe(s): Secondary | ICD-10-CM

## 2022-10-07 DIAGNOSIS — B351 Tinea unguium: Secondary | ICD-10-CM | POA: Diagnosis not present

## 2022-10-07 NOTE — Progress Notes (Unsigned)
Subjective: Chief Complaint  Patient presents with   Diabetes    NP    Diabetic footcare, bilateral onychomycosis long nails and chronic nail changes Referring Provider: Wenda Low     72 year old male presents the office with above concerns.  He states nails are thickened discolored and ongoing for some time.  No swelling redness or drainage.  No open lesions.  No other concerns.  Objective: AAO x3, NAD DP/PT pulses palpable bilaterally, CRT less than 3 seconds Nails are hypertrophic, dystrophic, brittle, discolored, elongated 10. No surrounding redness or drainage. Tenderness nails 1-5 bilaterally. No open lesions or pre-ulcerative lesions are identified today. No pain with calf compression, swelling, warmth, erythema  Assessment: 72 year old male with symptomatic onychomycosis  Plan: -All treatment options discussed with the patient including all alternatives, risks, complications.  -Sharply debrided nails x 10 without any complications or bleeding.  Discussed treatment options evaluated once received.  Will order compound cream through [facility] to look at nail fungus. -Patient encouraged to call the office with any questions, concerns, change in symptoms.   Trula Slade DPM

## 2022-10-07 NOTE — Telephone Encounter (Signed)
Patient called in to say that he had received a call from me. I had left several messages to discuss his sleep study. He was notified of his sleep study results and recommendations he agrees to proceed with CPAP therapy. Per patient order was sent to [facility].

## 2022-10-09 ENCOUNTER — Other Ambulatory Visit (HOSPITAL_COMMUNITY): Payer: Self-pay

## 2022-10-28 DIAGNOSIS — E113312 Type 2 diabetes mellitus with moderate nonproliferative diabetic retinopathy with macular edema, left eye: Secondary | ICD-10-CM | POA: Diagnosis not present

## 2022-11-01 ENCOUNTER — Other Ambulatory Visit (HOSPITAL_COMMUNITY): Payer: Self-pay

## 2022-11-01 ENCOUNTER — Other Ambulatory Visit: Payer: Self-pay

## 2022-11-01 MED ORDER — CYCLOBENZAPRINE HCL 10 MG PO TABS
10.0000 mg | ORAL_TABLET | Freq: Three times a day (TID) | ORAL | 2 refills | Status: DC | PRN
  Filled 2022-11-01: qty 30, 10d supply, fill #0
  Filled 2022-11-30: qty 30, 10d supply, fill #1
  Filled 2023-01-14: qty 30, 10d supply, fill #2

## 2022-11-04 ENCOUNTER — Other Ambulatory Visit (HOSPITAL_COMMUNITY): Payer: Self-pay

## 2022-11-04 MED ORDER — SILDENAFIL CITRATE 100 MG PO TABS
100.0000 mg | ORAL_TABLET | Freq: Every day | ORAL | 3 refills | Status: DC
  Filled 2022-11-04: qty 30, 30d supply, fill #0
  Filled 2022-12-13: qty 30, 30d supply, fill #1
  Filled 2023-01-28: qty 30, 30d supply, fill #2

## 2022-11-11 ENCOUNTER — Other Ambulatory Visit (HOSPITAL_COMMUNITY): Payer: Self-pay

## 2022-11-12 ENCOUNTER — Other Ambulatory Visit (HOSPITAL_COMMUNITY): Payer: Self-pay

## 2022-11-12 MED ORDER — GLIPIZIDE 5 MG PO TABS
5.0000 mg | ORAL_TABLET | Freq: Every day | ORAL | 3 refills | Status: DC | PRN
  Filled 2022-11-12: qty 90, 90d supply, fill #0
  Filled 2023-08-21: qty 90, 90d supply, fill #1

## 2022-11-14 ENCOUNTER — Other Ambulatory Visit (HOSPITAL_COMMUNITY): Payer: Self-pay

## 2022-11-20 ENCOUNTER — Other Ambulatory Visit: Payer: Self-pay

## 2022-11-22 ENCOUNTER — Other Ambulatory Visit (HOSPITAL_COMMUNITY): Payer: Self-pay

## 2022-11-25 DIAGNOSIS — E113311 Type 2 diabetes mellitus with moderate nonproliferative diabetic retinopathy with macular edema, right eye: Secondary | ICD-10-CM | POA: Diagnosis not present

## 2022-12-13 ENCOUNTER — Other Ambulatory Visit (HOSPITAL_COMMUNITY): Payer: Self-pay

## 2022-12-13 ENCOUNTER — Other Ambulatory Visit (HOSPITAL_BASED_OUTPATIENT_CLINIC_OR_DEPARTMENT_OTHER): Payer: Self-pay

## 2022-12-13 MED ORDER — TRESIBA FLEXTOUCH 100 UNIT/ML ~~LOC~~ SOPN
28.0000 [IU] | PEN_INJECTOR | Freq: Every day | SUBCUTANEOUS | 3 refills | Status: DC
  Filled 2022-12-13: qty 24, 85d supply, fill #0
  Filled 2023-05-01: qty 24, 85d supply, fill #1
  Filled 2023-08-21 (×2): qty 24, 85d supply, fill #2

## 2022-12-23 ENCOUNTER — Other Ambulatory Visit: Payer: Self-pay

## 2022-12-23 ENCOUNTER — Other Ambulatory Visit (HOSPITAL_COMMUNITY): Payer: Self-pay

## 2022-12-23 MED ORDER — NOVOFINE PEN NEEDLE 32G X 6 MM MISC
3 refills | Status: AC
  Filled 2022-12-23: qty 100, 100d supply, fill #0

## 2022-12-24 ENCOUNTER — Other Ambulatory Visit (HOSPITAL_COMMUNITY): Payer: Self-pay

## 2023-01-06 ENCOUNTER — Ambulatory Visit: Payer: 59 | Admitting: Podiatry

## 2023-01-14 ENCOUNTER — Telehealth: Payer: Self-pay

## 2023-01-14 NOTE — Telephone Encounter (Signed)
Spoke with patient, notified of sleep study results and recommendations. New order placed through [facility] for CPAP machine today 01/14/23. Patient verbalized understanding and all questions (if any) were answered.

## 2023-01-21 DIAGNOSIS — I700 Atherosclerosis of aorta: Secondary | ICD-10-CM | POA: Diagnosis not present

## 2023-01-21 DIAGNOSIS — E113313 Type 2 diabetes mellitus with moderate nonproliferative diabetic retinopathy with macular edema, bilateral: Secondary | ICD-10-CM | POA: Diagnosis not present

## 2023-01-21 DIAGNOSIS — E1140 Type 2 diabetes mellitus with diabetic neuropathy, unspecified: Secondary | ICD-10-CM | POA: Diagnosis not present

## 2023-01-21 DIAGNOSIS — J439 Emphysema, unspecified: Secondary | ICD-10-CM | POA: Diagnosis not present

## 2023-01-21 DIAGNOSIS — Z794 Long term (current) use of insulin: Secondary | ICD-10-CM | POA: Diagnosis not present

## 2023-01-21 DIAGNOSIS — I480 Paroxysmal atrial fibrillation: Secondary | ICD-10-CM | POA: Diagnosis not present

## 2023-01-21 DIAGNOSIS — I10 Essential (primary) hypertension: Secondary | ICD-10-CM | POA: Diagnosis not present

## 2023-01-21 DIAGNOSIS — E7800 Pure hypercholesterolemia, unspecified: Secondary | ICD-10-CM | POA: Diagnosis not present

## 2023-01-21 DIAGNOSIS — E1165 Type 2 diabetes mellitus with hyperglycemia: Secondary | ICD-10-CM | POA: Diagnosis not present

## 2023-01-21 DIAGNOSIS — M25562 Pain in left knee: Secondary | ICD-10-CM | POA: Diagnosis not present

## 2023-01-21 DIAGNOSIS — Z23 Encounter for immunization: Secondary | ICD-10-CM | POA: Diagnosis not present

## 2023-01-22 ENCOUNTER — Other Ambulatory Visit (HOSPITAL_COMMUNITY): Payer: Self-pay

## 2023-01-22 MED ORDER — DEXCOM G7 SENSOR MISC
3 refills | Status: DC
  Filled 2023-01-22: qty 9, 90d supply, fill #0
  Filled 2023-04-24: qty 9, 90d supply, fill #1
  Filled 2023-08-21: qty 9, 90d supply, fill #2
  Filled ????-??-??: fill #2

## 2023-01-24 DIAGNOSIS — M25562 Pain in left knee: Secondary | ICD-10-CM | POA: Diagnosis not present

## 2023-01-28 ENCOUNTER — Other Ambulatory Visit (HOSPITAL_COMMUNITY): Payer: Self-pay

## 2023-01-28 DIAGNOSIS — G4733 Obstructive sleep apnea (adult) (pediatric): Secondary | ICD-10-CM | POA: Diagnosis not present

## 2023-01-28 MED ORDER — METFORMIN HCL 1000 MG PO TABS
1000.0000 mg | ORAL_TABLET | Freq: Two times a day (BID) | ORAL | 3 refills | Status: DC
  Filled 2023-01-28: qty 180, 90d supply, fill #0
  Filled 2023-08-21 (×2): qty 180, 90d supply, fill #1

## 2023-01-29 ENCOUNTER — Other Ambulatory Visit (HOSPITAL_COMMUNITY): Payer: Self-pay

## 2023-01-30 ENCOUNTER — Other Ambulatory Visit (HOSPITAL_COMMUNITY): Payer: Self-pay

## 2023-01-30 ENCOUNTER — Other Ambulatory Visit: Payer: Self-pay

## 2023-01-30 MED ORDER — AZITHROMYCIN 250 MG PO TABS
ORAL_TABLET | ORAL | 0 refills | Status: DC
  Filled 2023-01-30: qty 6, 5d supply, fill #0

## 2023-02-04 ENCOUNTER — Other Ambulatory Visit (HOSPITAL_COMMUNITY): Payer: Self-pay

## 2023-02-04 MED ORDER — METOPROLOL SUCCINATE ER 25 MG PO TB24
25.0000 mg | ORAL_TABLET | Freq: Every day | ORAL | 3 refills | Status: DC
  Filled 2023-02-04: qty 90, 90d supply, fill #0
  Filled 2023-05-12: qty 90, 90d supply, fill #1
  Filled 2023-08-21 (×2): qty 90, 90d supply, fill #2

## 2023-02-06 DIAGNOSIS — E1165 Type 2 diabetes mellitus with hyperglycemia: Secondary | ICD-10-CM | POA: Diagnosis not present

## 2023-02-06 DIAGNOSIS — I10 Essential (primary) hypertension: Secondary | ICD-10-CM | POA: Diagnosis not present

## 2023-02-06 DIAGNOSIS — E782 Mixed hyperlipidemia: Secondary | ICD-10-CM | POA: Diagnosis not present

## 2023-02-06 DIAGNOSIS — J439 Emphysema, unspecified: Secondary | ICD-10-CM | POA: Diagnosis not present

## 2023-02-06 DIAGNOSIS — E1140 Type 2 diabetes mellitus with diabetic neuropathy, unspecified: Secondary | ICD-10-CM | POA: Diagnosis not present

## 2023-02-06 DIAGNOSIS — I480 Paroxysmal atrial fibrillation: Secondary | ICD-10-CM | POA: Diagnosis not present

## 2023-02-13 ENCOUNTER — Other Ambulatory Visit (HOSPITAL_COMMUNITY): Payer: Self-pay

## 2023-03-02 ENCOUNTER — Other Ambulatory Visit (HOSPITAL_COMMUNITY): Payer: Self-pay

## 2023-03-02 MED ORDER — CYCLOBENZAPRINE HCL 10 MG PO TABS
10.0000 mg | ORAL_TABLET | Freq: Three times a day (TID) | ORAL | 3 refills | Status: DC | PRN
  Filled 2023-03-02: qty 30, 10d supply, fill #0
  Filled 2023-04-01: qty 30, 10d supply, fill #1
  Filled 2023-05-12: qty 30, 10d supply, fill #2
  Filled 2023-06-15: qty 30, 10d supply, fill #3

## 2023-03-03 ENCOUNTER — Other Ambulatory Visit (HOSPITAL_COMMUNITY): Payer: Self-pay

## 2023-03-17 DIAGNOSIS — H524 Presbyopia: Secondary | ICD-10-CM | POA: Diagnosis not present

## 2023-03-17 DIAGNOSIS — H35033 Hypertensive retinopathy, bilateral: Secondary | ICD-10-CM | POA: Diagnosis not present

## 2023-03-17 DIAGNOSIS — H40013 Open angle with borderline findings, low risk, bilateral: Secondary | ICD-10-CM | POA: Diagnosis not present

## 2023-03-17 DIAGNOSIS — H43813 Vitreous degeneration, bilateral: Secondary | ICD-10-CM | POA: Diagnosis not present

## 2023-03-17 DIAGNOSIS — E113213 Type 2 diabetes mellitus with mild nonproliferative diabetic retinopathy with macular edema, bilateral: Secondary | ICD-10-CM | POA: Diagnosis not present

## 2023-03-20 DIAGNOSIS — G4733 Obstructive sleep apnea (adult) (pediatric): Secondary | ICD-10-CM | POA: Diagnosis not present

## 2023-04-01 ENCOUNTER — Other Ambulatory Visit (HOSPITAL_COMMUNITY): Payer: Self-pay

## 2023-04-01 MED ORDER — GABAPENTIN 300 MG PO CAPS
300.0000 mg | ORAL_CAPSULE | Freq: Four times a day (QID) | ORAL | 0 refills | Status: DC
  Filled 2023-04-01: qty 360, 90d supply, fill #0

## 2023-04-24 ENCOUNTER — Other Ambulatory Visit (HOSPITAL_COMMUNITY): Payer: Self-pay

## 2023-04-30 DIAGNOSIS — G4733 Obstructive sleep apnea (adult) (pediatric): Secondary | ICD-10-CM | POA: Diagnosis not present

## 2023-05-01 ENCOUNTER — Other Ambulatory Visit (HOSPITAL_COMMUNITY): Payer: Self-pay

## 2023-05-01 ENCOUNTER — Other Ambulatory Visit: Payer: Self-pay

## 2023-05-02 ENCOUNTER — Other Ambulatory Visit (HOSPITAL_COMMUNITY): Payer: Self-pay

## 2023-05-09 ENCOUNTER — Other Ambulatory Visit (HOSPITAL_COMMUNITY): Payer: Self-pay

## 2023-05-12 ENCOUNTER — Other Ambulatory Visit (HOSPITAL_COMMUNITY): Payer: Self-pay

## 2023-05-29 ENCOUNTER — Other Ambulatory Visit (HOSPITAL_COMMUNITY): Payer: Self-pay

## 2023-05-29 DIAGNOSIS — Z794 Long term (current) use of insulin: Secondary | ICD-10-CM | POA: Diagnosis not present

## 2023-05-29 DIAGNOSIS — J439 Emphysema, unspecified: Secondary | ICD-10-CM | POA: Diagnosis not present

## 2023-05-29 DIAGNOSIS — E1140 Type 2 diabetes mellitus with diabetic neuropathy, unspecified: Secondary | ICD-10-CM | POA: Diagnosis not present

## 2023-05-29 DIAGNOSIS — E113313 Type 2 diabetes mellitus with moderate nonproliferative diabetic retinopathy with macular edema, bilateral: Secondary | ICD-10-CM | POA: Diagnosis not present

## 2023-05-29 DIAGNOSIS — E1165 Type 2 diabetes mellitus with hyperglycemia: Secondary | ICD-10-CM | POA: Diagnosis not present

## 2023-05-29 DIAGNOSIS — I480 Paroxysmal atrial fibrillation: Secondary | ICD-10-CM | POA: Diagnosis not present

## 2023-05-29 DIAGNOSIS — G4733 Obstructive sleep apnea (adult) (pediatric): Secondary | ICD-10-CM | POA: Diagnosis not present

## 2023-05-29 DIAGNOSIS — I700 Atherosclerosis of aorta: Secondary | ICD-10-CM | POA: Diagnosis not present

## 2023-05-29 DIAGNOSIS — I10 Essential (primary) hypertension: Secondary | ICD-10-CM | POA: Diagnosis not present

## 2023-05-29 DIAGNOSIS — Z23 Encounter for immunization: Secondary | ICD-10-CM | POA: Diagnosis not present

## 2023-05-29 MED ORDER — OZEMPIC (0.25 OR 0.5 MG/DOSE) 2 MG/3ML ~~LOC~~ SOPN
0.5000 mg | PEN_INJECTOR | SUBCUTANEOUS | 5 refills | Status: DC
  Filled 2023-05-29: qty 3, 28d supply, fill #0
  Filled 2023-08-21 (×2): qty 3, 28d supply, fill #1
  Filled 2023-09-25: qty 3, 28d supply, fill #2

## 2023-06-03 ENCOUNTER — Other Ambulatory Visit (HOSPITAL_COMMUNITY): Payer: Self-pay

## 2023-06-10 DIAGNOSIS — G4733 Obstructive sleep apnea (adult) (pediatric): Secondary | ICD-10-CM | POA: Diagnosis not present

## 2023-06-17 ENCOUNTER — Other Ambulatory Visit (HOSPITAL_COMMUNITY): Payer: Self-pay

## 2023-06-30 ENCOUNTER — Other Ambulatory Visit (HOSPITAL_COMMUNITY): Payer: Self-pay

## 2023-06-30 MED ORDER — DEXCOM G7 RECEIVER DEVI
0 refills | Status: DC
  Filled 2023-06-30: qty 1, 30d supply, fill #0

## 2023-06-30 MED ORDER — DEXCOM G7 SENSOR MISC
1.0000 | 3 refills | Status: DC
  Filled 2023-06-30: qty 9, 90d supply, fill #0
  Filled 2023-09-08 – 2023-09-25 (×2): qty 9, 90d supply, fill #1
  Filled 2023-11-27 – 2024-01-06 (×4): qty 9, 90d supply, fill #2
  Filled 2024-04-28: qty 9, 90d supply, fill #3

## 2023-07-01 ENCOUNTER — Other Ambulatory Visit (HOSPITAL_COMMUNITY): Payer: Self-pay

## 2023-07-01 ENCOUNTER — Other Ambulatory Visit: Payer: Self-pay

## 2023-07-02 ENCOUNTER — Other Ambulatory Visit (HOSPITAL_COMMUNITY): Payer: Self-pay

## 2023-07-10 ENCOUNTER — Other Ambulatory Visit (HOSPITAL_COMMUNITY): Payer: Self-pay

## 2023-07-10 MED ORDER — PEG 3350-KCL-NA BICARB-NACL 420 G PO SOLR
ORAL | 0 refills | Status: DC
  Filled 2023-07-10: qty 4000, 1d supply, fill #0

## 2023-07-10 MED ORDER — BISACODYL 5 MG PO TBEC
DELAYED_RELEASE_TABLET | ORAL | 0 refills | Status: DC
  Filled 2023-07-10: qty 4, 1d supply, fill #0

## 2023-07-14 ENCOUNTER — Other Ambulatory Visit: Payer: Self-pay

## 2023-07-14 ENCOUNTER — Other Ambulatory Visit (HOSPITAL_COMMUNITY): Payer: Self-pay

## 2023-07-14 MED ORDER — DULOXETINE HCL 60 MG PO CPEP
60.0000 mg | ORAL_CAPSULE | Freq: Every day | ORAL | 1 refills | Status: DC
  Filled 2023-07-14: qty 90, 90d supply, fill #0
  Filled 2023-10-16: qty 90, 90d supply, fill #1

## 2023-07-15 ENCOUNTER — Other Ambulatory Visit (HOSPITAL_COMMUNITY): Payer: Self-pay

## 2023-07-24 DIAGNOSIS — Z860101 Personal history of adenomatous and serrated colon polyps: Secondary | ICD-10-CM | POA: Diagnosis not present

## 2023-07-24 DIAGNOSIS — D123 Benign neoplasm of transverse colon: Secondary | ICD-10-CM | POA: Diagnosis not present

## 2023-07-24 DIAGNOSIS — Z09 Encounter for follow-up examination after completed treatment for conditions other than malignant neoplasm: Secondary | ICD-10-CM | POA: Diagnosis not present

## 2023-07-24 DIAGNOSIS — D120 Benign neoplasm of cecum: Secondary | ICD-10-CM | POA: Diagnosis not present

## 2023-07-24 DIAGNOSIS — D124 Benign neoplasm of descending colon: Secondary | ICD-10-CM | POA: Diagnosis not present

## 2023-07-24 DIAGNOSIS — D122 Benign neoplasm of ascending colon: Secondary | ICD-10-CM | POA: Diagnosis not present

## 2023-07-28 DIAGNOSIS — D120 Benign neoplasm of cecum: Secondary | ICD-10-CM | POA: Diagnosis not present

## 2023-07-28 DIAGNOSIS — D123 Benign neoplasm of transverse colon: Secondary | ICD-10-CM | POA: Diagnosis not present

## 2023-07-28 DIAGNOSIS — D122 Benign neoplasm of ascending colon: Secondary | ICD-10-CM | POA: Diagnosis not present

## 2023-07-28 DIAGNOSIS — D124 Benign neoplasm of descending colon: Secondary | ICD-10-CM | POA: Diagnosis not present

## 2023-08-02 ENCOUNTER — Other Ambulatory Visit (HOSPITAL_COMMUNITY): Payer: Self-pay

## 2023-08-05 ENCOUNTER — Other Ambulatory Visit (HOSPITAL_COMMUNITY): Payer: Self-pay

## 2023-08-05 MED ORDER — CYCLOBENZAPRINE HCL 10 MG PO TABS
10.0000 mg | ORAL_TABLET | Freq: Three times a day (TID) | ORAL | 3 refills | Status: DC | PRN
  Filled 2023-08-05: qty 30, 10d supply, fill #0

## 2023-08-15 ENCOUNTER — Other Ambulatory Visit (HOSPITAL_COMMUNITY): Payer: Self-pay

## 2023-08-21 ENCOUNTER — Other Ambulatory Visit (HOSPITAL_COMMUNITY): Payer: Self-pay

## 2023-08-21 ENCOUNTER — Other Ambulatory Visit: Payer: Self-pay

## 2023-08-21 ENCOUNTER — Encounter (HOSPITAL_COMMUNITY): Payer: Self-pay

## 2023-08-21 MED ORDER — GABAPENTIN 300 MG PO CAPS
300.0000 mg | ORAL_CAPSULE | Freq: Four times a day (QID) | ORAL | 0 refills | Status: DC
  Filled 2023-08-21: qty 360, 90d supply, fill #0

## 2023-08-21 MED ORDER — CYCLOBENZAPRINE HCL 10 MG PO TABS
10.0000 mg | ORAL_TABLET | Freq: Three times a day (TID) | ORAL | 3 refills | Status: DC | PRN
  Filled 2023-08-21: qty 30, 10d supply, fill #0
  Filled 2023-09-25: qty 30, 10d supply, fill #1
  Filled 2023-11-27: qty 30, 10d supply, fill #2
  Filled 2024-01-06: qty 30, 10d supply, fill #3

## 2023-08-28 ENCOUNTER — Other Ambulatory Visit (HOSPITAL_COMMUNITY): Payer: Self-pay

## 2023-09-17 DIAGNOSIS — H524 Presbyopia: Secondary | ICD-10-CM | POA: Diagnosis not present

## 2023-09-17 DIAGNOSIS — H40023 Open angle with borderline findings, high risk, bilateral: Secondary | ICD-10-CM | POA: Diagnosis not present

## 2023-09-18 ENCOUNTER — Other Ambulatory Visit (HOSPITAL_COMMUNITY): Payer: Self-pay

## 2023-09-25 ENCOUNTER — Other Ambulatory Visit (HOSPITAL_COMMUNITY): Payer: Self-pay

## 2023-09-25 MED ORDER — SIMVASTATIN 20 MG PO TABS
20.0000 mg | ORAL_TABLET | Freq: Every evening | ORAL | 4 refills | Status: DC
  Filled 2023-09-25: qty 90, 90d supply, fill #0

## 2023-09-26 ENCOUNTER — Other Ambulatory Visit (HOSPITAL_COMMUNITY): Payer: Self-pay

## 2023-09-26 ENCOUNTER — Other Ambulatory Visit: Payer: Self-pay

## 2023-09-26 DIAGNOSIS — E782 Mixed hyperlipidemia: Secondary | ICD-10-CM | POA: Diagnosis not present

## 2023-09-26 DIAGNOSIS — E1121 Type 2 diabetes mellitus with diabetic nephropathy: Secondary | ICD-10-CM | POA: Diagnosis not present

## 2023-09-26 DIAGNOSIS — M48061 Spinal stenosis, lumbar region without neurogenic claudication: Secondary | ICD-10-CM | POA: Diagnosis not present

## 2023-09-26 DIAGNOSIS — I480 Paroxysmal atrial fibrillation: Secondary | ICD-10-CM | POA: Diagnosis not present

## 2023-09-26 DIAGNOSIS — Z794 Long term (current) use of insulin: Secondary | ICD-10-CM | POA: Diagnosis not present

## 2023-09-26 DIAGNOSIS — E113313 Type 2 diabetes mellitus with moderate nonproliferative diabetic retinopathy with macular edema, bilateral: Secondary | ICD-10-CM | POA: Diagnosis not present

## 2023-09-26 DIAGNOSIS — J439 Emphysema, unspecified: Secondary | ICD-10-CM | POA: Diagnosis not present

## 2023-09-26 DIAGNOSIS — I10 Essential (primary) hypertension: Secondary | ICD-10-CM | POA: Diagnosis not present

## 2023-09-26 DIAGNOSIS — Z0000 Encounter for general adult medical examination without abnormal findings: Secondary | ICD-10-CM | POA: Diagnosis not present

## 2023-09-26 DIAGNOSIS — Z23 Encounter for immunization: Secondary | ICD-10-CM | POA: Diagnosis not present

## 2023-09-26 DIAGNOSIS — E0821 Diabetes mellitus due to underlying condition with diabetic nephropathy: Secondary | ICD-10-CM | POA: Diagnosis not present

## 2023-09-26 DIAGNOSIS — I700 Atherosclerosis of aorta: Secondary | ICD-10-CM | POA: Diagnosis not present

## 2023-09-26 MED ORDER — METFORMIN HCL 1000 MG PO TABS
1000.0000 mg | ORAL_TABLET | Freq: Two times a day (BID) | ORAL | 5 refills | Status: AC
  Filled 2023-09-26 – 2024-08-08 (×3): qty 180, 90d supply, fill #0

## 2023-09-26 MED ORDER — GLIPIZIDE 5 MG PO TABS
5.0000 mg | ORAL_TABLET | Freq: Every day | ORAL | 3 refills | Status: AC
  Filled 2023-09-26 – 2024-04-08 (×4): qty 90, 90d supply, fill #0
  Filled 2024-06-22: qty 90, 90d supply, fill #1

## 2023-09-26 MED ORDER — DAPAGLIFLOZIN PROPANEDIOL 10 MG PO TABS
10.0000 mg | ORAL_TABLET | Freq: Every day | ORAL | 5 refills | Status: DC
  Filled 2023-09-26 – 2023-11-27 (×2): qty 90, 90d supply, fill #0
  Filled 2024-03-08: qty 90, 90d supply, fill #1
  Filled 2024-06-22: qty 90, 90d supply, fill #2

## 2023-09-26 MED ORDER — TRESIBA FLEXTOUCH 100 UNIT/ML ~~LOC~~ SOPN
28.0000 [IU] | PEN_INJECTOR | Freq: Every day | SUBCUTANEOUS | 3 refills | Status: AC
  Filled 2023-09-26 – 2023-11-27 (×2): qty 30, 107d supply, fill #0
  Filled 2024-03-09: qty 30, 107d supply, fill #1
  Filled 2024-06-22: qty 30, 107d supply, fill #2

## 2023-09-26 MED ORDER — METOPROLOL SUCCINATE ER 25 MG PO TB24
25.0000 mg | ORAL_TABLET | Freq: Every day | ORAL | 5 refills | Status: DC
  Filled 2023-09-26 – 2023-11-27 (×2): qty 90, 90d supply, fill #0
  Filled 2024-03-08: qty 90, 90d supply, fill #1
  Filled 2024-06-22: qty 90, 90d supply, fill #2

## 2023-09-26 MED ORDER — LOSARTAN POTASSIUM 100 MG PO TABS
100.0000 mg | ORAL_TABLET | Freq: Every day | ORAL | 5 refills | Status: AC
  Filled 2023-09-26: qty 90, 90d supply, fill #0
  Filled 2024-01-06: qty 90, 90d supply, fill #1
  Filled 2024-04-28: qty 90, 90d supply, fill #2
  Filled 2024-07-23: qty 90, 90d supply, fill #0

## 2023-09-26 MED ORDER — SIMVASTATIN 20 MG PO TABS
20.0000 mg | ORAL_TABLET | Freq: Every evening | ORAL | 4 refills | Status: DC
  Filled 2023-09-26 – 2024-01-06 (×3): qty 90, 90d supply, fill #0

## 2023-09-26 MED ORDER — GABAPENTIN 300 MG PO CAPS
300.0000 mg | ORAL_CAPSULE | Freq: Four times a day (QID) | ORAL | 5 refills | Status: DC
  Filled 2023-09-26: qty 360, 90d supply, fill #0

## 2023-09-26 MED ORDER — OZEMPIC (1 MG/DOSE) 4 MG/3ML ~~LOC~~ SOPN
1.0000 mg | PEN_INJECTOR | SUBCUTANEOUS | 5 refills | Status: DC
  Filled 2023-09-26: qty 9, 84d supply, fill #0
  Filled 2023-12-19: qty 9, 84d supply, fill #1

## 2023-09-30 DIAGNOSIS — G4733 Obstructive sleep apnea (adult) (pediatric): Secondary | ICD-10-CM | POA: Diagnosis not present

## 2023-10-06 ENCOUNTER — Ambulatory Visit (INDEPENDENT_AMBULATORY_CARE_PROVIDER_SITE_OTHER): Payer: 59 | Admitting: Podiatry

## 2023-10-06 ENCOUNTER — Encounter: Payer: Self-pay | Admitting: Podiatry

## 2023-10-06 DIAGNOSIS — M79674 Pain in right toe(s): Secondary | ICD-10-CM | POA: Diagnosis not present

## 2023-10-06 DIAGNOSIS — E1140 Type 2 diabetes mellitus with diabetic neuropathy, unspecified: Secondary | ICD-10-CM

## 2023-10-06 DIAGNOSIS — B351 Tinea unguium: Secondary | ICD-10-CM | POA: Diagnosis not present

## 2023-10-06 DIAGNOSIS — E1149 Type 2 diabetes mellitus with other diabetic neurological complication: Secondary | ICD-10-CM | POA: Diagnosis not present

## 2023-10-06 DIAGNOSIS — L84 Corns and callosities: Secondary | ICD-10-CM | POA: Diagnosis not present

## 2023-10-06 DIAGNOSIS — M79675 Pain in left toe(s): Secondary | ICD-10-CM | POA: Diagnosis not present

## 2023-10-06 NOTE — Progress Notes (Signed)
Subjective:   Patient ID: Timothy Peters, male   DOB: 73 y.o.   MRN: [id number]   HPI Patient presents with painful lesion on the second toes of both feet big toe left that are painful when pressed making it hard to wear shoe gear with thick yellow brittle nailbeds 1-5 both feet painful   ROS      Objective:  Physical Exam  Neurovascular status found to be intact with thick yellow brittle nailbeds 1-5 both feet mycotic nail infection with pain 1-5 both feet and history of diabetes with neuropathic condition     Assessment:  Lesion x 3-second digit bilateral left hallux with moderate diabetic neuropathy high risk factor and mycotic nail infection 1-5 both feet painful      Plan:  Debride    lesions and nails. No iatrogenic bleeding noted

## 2023-10-09 ENCOUNTER — Ambulatory Visit: Payer: 59 | Attending: Internal Medicine | Admitting: Internal Medicine

## 2023-10-15 DIAGNOSIS — G4733 Obstructive sleep apnea (adult) (pediatric): Secondary | ICD-10-CM | POA: Diagnosis not present

## 2023-10-16 ENCOUNTER — Other Ambulatory Visit (HOSPITAL_COMMUNITY): Payer: Self-pay

## 2023-10-28 DIAGNOSIS — G4733 Obstructive sleep apnea (adult) (pediatric): Secondary | ICD-10-CM | POA: Diagnosis not present

## 2023-11-04 ENCOUNTER — Ambulatory Visit: Payer: 59 | Admitting: Internal Medicine

## 2023-11-06 ENCOUNTER — Other Ambulatory Visit: Payer: Self-pay

## 2023-11-12 DIAGNOSIS — G4733 Obstructive sleep apnea (adult) (pediatric): Secondary | ICD-10-CM | POA: Diagnosis not present
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
## 2023-11-17 ENCOUNTER — Encounter (HOSPITAL_BASED_OUTPATIENT_CLINIC_OR_DEPARTMENT_OTHER): Payer: Self-pay

## 2023-11-19 ENCOUNTER — Encounter: Payer: Self-pay | Admitting: General Practice

## 2023-11-19 ENCOUNTER — Ambulatory Visit: Attending: General Practice | Admitting: General Practice

## 2023-11-19 VITALS — BP 130/70 | Ht 71.0 in

## 2023-11-19 DIAGNOSIS — G4733 Obstructive sleep apnea (adult) (pediatric): Secondary | ICD-10-CM

## 2023-11-19 DIAGNOSIS — R55 Syncope and collapse: Secondary | ICD-10-CM

## 2023-11-19 DIAGNOSIS — I10 Essential (primary) hypertension: Secondary | ICD-10-CM

## 2023-11-19 DIAGNOSIS — E782 Mixed hyperlipidemia: Secondary | ICD-10-CM

## 2023-11-19 NOTE — Patient Instructions (Addendum)
 Medication Instructions:  The current medical regimen is effective;  continue present plan and medications as directed. Please refer to the Current Medication list given to you today.  *If you need a refill on your cardiac medications before your next appointment, please call your pharmacy*  Lab Work: NONE If you have labs (blood work) drawn today and your tests are completely normal, you will receive your results only by:  MyChart Message (if you have MyChart) OR  A paper copy in the mail If you have any lab test that is abnormal or we need to change your treatment, we will call you to review the results.  Testing/Procedures: SEE ATTACHED SLEEP TIPS  Follow-Up: At [facility], you and your health needs are our priority.  As part of our continuing mission to provide you with exceptional heart care, our providers are all part of one team.  This team includes your primary Cardiologist (physician) and Advanced Practice Providers or APPs (Physician Assistants and Nurse Practitioners) who all work together to provide you with the care you need, when you need it.  Your next appointment:   9-12 month(s)  Provider:   Maisie Fus, MD  or Edd Fabian, FNP          Quality Sleep Information, Adult Quality sleep is important for your mental and physical health. It also improves your quality of life. Quality sleep means you: Are asleep for most of the time you are in bed. Fall asleep within 30 minutes. Wake up no more than once a night. Are awake for no longer than 20 minutes if you do wake up during the night. Most adults need 7-8 hours of quality sleep each night. How can poor sleep affect me? If you do not get enough quality sleep, you may have: Mood swings. Daytime sleepiness. Decreased alertness, reaction time, and concentration. Sleep disorders, such as insomnia and sleep apnea. Difficulty with: Solving problems. Coping with stress. Paying attention. These issues may  affect your performance and productivity at work, school, and home. Lack of sleep may also put you at higher risk for accidents, suicide, and risky behaviors. If you do not get quality sleep, you may also be at higher risk for several health problems, including: Infections. Type 2 diabetes. Heart disease. High blood pressure. Obesity. Worsening of long-term conditions, like arthritis, kidney disease, depression, Parkinson's disease, and epilepsy. What actions can I take to get more quality sleep? Sleep schedule and routine Stick to a sleep schedule. Go to sleep and wake up at about the same time each day. Do not try to sleep less on weekdays and make up for lost sleep on weekends. This does not work. Limit naps during the day to 30 minutes or less. Do not take naps in the late afternoon. Make time to relax before bed. Reading, listening to music, or taking a hot bath promotes quality sleep. Make your bedroom a place that promotes quality sleep. Keep your bedroom dark, quiet, and at a comfortable room temperature. Make sure your bed is comfortable. Avoid using electronic devices that give off bright blue light for 30 minutes before bedtime. Your brain perceives bright blue light as sunlight. This includes television, phones, and computers. If you are lying awake in bed for longer than 20 minutes, get up and do a relaxing activity until you feel sleepy. Lifestyle     Try to get at least 30 minutes of exercise on most days. Do not exercise 2-3 hours before going to bed.  Do not use any products that contain nicotine or tobacco. These products include cigarettes, chewing tobacco, and vaping devices, such as e-cigarettes. If you need help quitting, ask your health care provider. Do not drink caffeinated beverages for at least 8 hours before going to bed. Coffee, tea, and some sodas contain caffeine. Do not drink alcohol or eat large meals close to bedtime. Try to get at least 30 minutes of sunlight  every day. Morning sunlight is best. Medical concerns Work with your health care provider to treat medical conditions that may affect sleeping, such as: Nasal obstruction. Snoring. Sleep apnea and other sleep disorders. Talk to your health care provider if you think any of your prescription medicines may cause you to have difficulty falling or staying asleep. If you have sleep problems, talk with a sleep consultant. If you think you have a sleep disorder, talk with your health care provider about getting evaluated by a specialist. Where to find more information Sleep Foundation: sleepfoundation.org American Academy of Sleep Medicine: aasm.org Centers for Disease Control and Prevention (CDC): TonerPromos.no Contact a health care provider if: You have trouble getting to sleep or staying asleep. You often wake up very early in the morning and cannot get back to sleep. You have daytime sleepiness. You have daytime sleep attacks of suddenly falling asleep and sudden muscle weakness (narcolepsy). You have a tingling sensation in your legs with a strong urge to move your legs (restless legs syndrome). You stop breathing briefly during sleep (sleep apnea). You think you have a sleep disorder or are taking a medicine that is affecting your quality of sleep. Summary Most adults need 7-8 hours of quality sleep each night. Getting enough quality sleep is important for your mental and physical health. Make your bedroom a place that promotes quality sleep, and avoid things that may cause you to have poor sleep, such as alcohol, caffeine, smoking, or large meals. Talk to your health care provider if you have trouble falling asleep or staying asleep. This information is not intended to replace advice given to you by your health care provider. Make sure you discuss any questions you have with your health care provider. Document Revised: 11/28/2021 Document Reviewed: 11/28/2021 Elsevier Patient Education  2024  Elsevier Inc.   1st Floor: - Lobby - Registration  - Pharmacy  - Lab - Cafe  2nd Floor: - PV Lab - Diagnostic Testing (echo, CT, nuclear med)  3rd Floor: - Vacant  4th Floor: - TCTS (cardiothoracic surgery) - AFib Clinic - Structural Heart Clinic - Vascular Surgery  - Vascular Ultrasound  5th Floor: - HeartCare Cardiology (general and EP) - Clinical Pharmacy for coumadin, hypertension, lipid, weight-loss medications, and med management appointments    Valet parking services will be available as well.

## 2023-11-27 ENCOUNTER — Other Ambulatory Visit (HOSPITAL_COMMUNITY): Payer: Self-pay

## 2023-11-28 DIAGNOSIS — G4733 Obstructive sleep apnea (adult) (pediatric): Secondary | ICD-10-CM | POA: Diagnosis not present

## 2023-12-13 DIAGNOSIS — G4733 Obstructive sleep apnea (adult) (pediatric): Secondary | ICD-10-CM | POA: Diagnosis not present

## 2023-12-19 ENCOUNTER — Other Ambulatory Visit (HOSPITAL_COMMUNITY): Payer: Self-pay

## 2023-12-19 ENCOUNTER — Other Ambulatory Visit: Payer: Self-pay

## 2023-12-31 ENCOUNTER — Other Ambulatory Visit (HOSPITAL_COMMUNITY): Payer: Self-pay

## 2024-01-01 ENCOUNTER — Other Ambulatory Visit (HOSPITAL_COMMUNITY): Payer: Self-pay

## 2024-01-06 ENCOUNTER — Other Ambulatory Visit: Payer: Self-pay

## 2024-01-06 ENCOUNTER — Other Ambulatory Visit (HOSPITAL_COMMUNITY): Payer: Self-pay

## 2024-01-07 ENCOUNTER — Ambulatory Visit (INDEPENDENT_AMBULATORY_CARE_PROVIDER_SITE_OTHER): Admitting: Podiatry

## 2024-01-07 ENCOUNTER — Other Ambulatory Visit (HOSPITAL_COMMUNITY): Payer: Self-pay

## 2024-01-07 ENCOUNTER — Ambulatory Visit (INDEPENDENT_AMBULATORY_CARE_PROVIDER_SITE_OTHER)

## 2024-01-07 ENCOUNTER — Encounter: Payer: Self-pay | Admitting: Podiatry

## 2024-01-07 VITALS — Ht 71.0 in | Wt 201.0 lb

## 2024-01-07 DIAGNOSIS — M869 Osteomyelitis, unspecified: Secondary | ICD-10-CM

## 2024-01-07 DIAGNOSIS — L97522 Non-pressure chronic ulcer of other part of left foot with fat layer exposed: Secondary | ICD-10-CM

## 2024-01-07 DIAGNOSIS — M86172 Other acute osteomyelitis, left ankle and foot: Secondary | ICD-10-CM

## 2024-01-07 DIAGNOSIS — I999 Unspecified disorder of circulatory system: Secondary | ICD-10-CM | POA: Diagnosis not present

## 2024-01-07 MED ORDER — DOXYCYCLINE HYCLATE 100 MG PO TABS
100.0000 mg | ORAL_TABLET | Freq: Two times a day (BID) | ORAL | 1 refills | Status: DC
  Filled 2024-01-07: qty 30, 15d supply, fill #0
  Filled 2024-03-09 (×2): qty 30, 15d supply, fill #1

## 2024-01-08 NOTE — Progress Notes (Signed)
 Subjective:   Patient ID: Ryzen B Babich, male   DOB: 73 y.o.   MRN: [id number]   HPI Patient presents stating he has had a breakdown of skin around the second digit left it has been there for a few weeks and he knows he should have been here earlier.  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  Dispensed surgical shoe so there is no pressure discussed that there is a significant chance for amputation of this toe and I went ahead today and I am sending for vascular ABI checks and hopefully circulation could be improved.  May require amputation that he understands and I reviewed with him the possibilities.  Placed on doxycycline twice daily and reappoint in 2 weeks  X-ray indicates there is some resorption of the distal phalanx left second digit localized middle and proximal phalanx look healthy

## 2024-01-12 DIAGNOSIS — G4733 Obstructive sleep apnea (adult) (pediatric): Secondary | ICD-10-CM | POA: Diagnosis not present

## 2024-01-13 ENCOUNTER — Other Ambulatory Visit (HOSPITAL_COMMUNITY): Payer: Self-pay

## 2024-01-21 ENCOUNTER — Other Ambulatory Visit: Payer: Self-pay

## 2024-01-21 ENCOUNTER — Ambulatory Visit (INDEPENDENT_AMBULATORY_CARE_PROVIDER_SITE_OTHER): Admitting: Podiatry

## 2024-01-21 ENCOUNTER — Other Ambulatory Visit (HOSPITAL_COMMUNITY): Payer: Self-pay

## 2024-01-21 ENCOUNTER — Encounter: Payer: Self-pay | Admitting: Podiatry

## 2024-01-21 VITALS — Ht 71.0 in | Wt 201.0 lb

## 2024-01-21 DIAGNOSIS — L97522 Non-pressure chronic ulcer of other part of left foot with fat layer exposed: Secondary | ICD-10-CM | POA: Diagnosis not present

## 2024-01-21 MED ORDER — DULOXETINE HCL 60 MG PO CPEP
60.0000 mg | ORAL_CAPSULE | Freq: Every day | ORAL | 1 refills | Status: DC
  Filled 2024-01-21: qty 90, 90d supply, fill #0
  Filled 2024-04-28: qty 90, 90d supply, fill #1

## 2024-01-21 NOTE — Progress Notes (Signed)
 Subjective:   Patient ID: Timothy Peters, male   DOB: 73 y.o.   MRN: [id number]   HPI Patient states that he seems like he is feeling better with his left second toe is not hurting like it was and he admits he did not have his vascular studies as we had ordered as he was scared to answer his phone   ROS      Objective:  Physical Exam  Neurovascular status unchanged he has darkness of the left second toe but it does appear to be improving the toe is warm currently and only mildly tender with distal keratotic tissue formation low-grade breakdown of tissue but significantly improved from previous visit     Assessment:  At this point appears to be improving ulceration left second digit with history of antibiotics and surgical shoe usage with the area measuring about 5 x 5 mm distal portion of the toe     Plan:  H&P reviewed went ahead today Sharp sterile debridement of the area and did not have to lengthen the size there is a slight bit of breakdown measuring 2 x 2 mm localized no subcutaneous tissue noted no odor no drainage.  I want him to call and have a vascular study set up and I gave him all information and phone number for this and he promises to do this today and I want him to continue soaks continue open toed shoes and I am relatively hopeful this will heal uneventfully with strict instructions to come in or go to the emergency room if anything abnormal Ortho

## 2024-01-23 ENCOUNTER — Other Ambulatory Visit (HOSPITAL_COMMUNITY): Payer: Self-pay

## 2024-01-28 ENCOUNTER — Ambulatory Visit (HOSPITAL_COMMUNITY)
Admission: RE | Admit: 2024-01-28 | Discharge: 2024-01-28 | Disposition: A | Discharge status: Home / Self Care | Source: Ambulatory Visit | Attending: Podiatry | Admitting: Podiatry

## 2024-01-28 DIAGNOSIS — L97522 Non-pressure chronic ulcer of other part of left foot with fat layer exposed: Secondary | ICD-10-CM | POA: Diagnosis not present

## 2024-01-28 DIAGNOSIS — I999 Unspecified disorder of circulatory system: Secondary | ICD-10-CM

## 2024-01-28 LAB — VAS US PAD ABI
Left ABI: 0.75
Right ABI: 0.96

## 2024-02-12 DIAGNOSIS — G4733 Obstructive sleep apnea (adult) (pediatric): Secondary | ICD-10-CM | POA: Diagnosis not present

## 2024-02-13 ENCOUNTER — Other Ambulatory Visit (HOSPITAL_COMMUNITY): Payer: Self-pay

## 2024-02-16 ENCOUNTER — Other Ambulatory Visit (HOSPITAL_COMMUNITY): Payer: Self-pay

## 2024-02-17 ENCOUNTER — Other Ambulatory Visit (HOSPITAL_COMMUNITY): Payer: Self-pay

## 2024-02-17 ENCOUNTER — Encounter (HOSPITAL_COMMUNITY): Payer: Self-pay

## 2024-02-18 ENCOUNTER — Encounter: Payer: Self-pay | Admitting: Podiatry

## 2024-02-18 ENCOUNTER — Ambulatory Visit (INDEPENDENT_AMBULATORY_CARE_PROVIDER_SITE_OTHER)

## 2024-02-18 ENCOUNTER — Ambulatory Visit (INDEPENDENT_AMBULATORY_CARE_PROVIDER_SITE_OTHER): Admitting: Podiatry

## 2024-02-18 DIAGNOSIS — I999 Unspecified disorder of circulatory system: Secondary | ICD-10-CM | POA: Diagnosis not present

## 2024-02-18 DIAGNOSIS — L97522 Non-pressure chronic ulcer of other part of left foot with fat layer exposed: Secondary | ICD-10-CM

## 2024-02-18 DIAGNOSIS — M869 Osteomyelitis, unspecified: Secondary | ICD-10-CM

## 2024-02-18 NOTE — Progress Notes (Signed)
 Subjective:   Patient ID: Timothy Peters, male   DOB: 73 y.o.   MRN: [id number]   HPI Patient states the toe is still bothering him but is not severe and he knows it is discolored and he has not had any other current pathology associated with it and did have his vascular study done   ROS      Objective:  Physical Exam  No change in neurovascular he does have diminishment of vascular structure with with ABIs showing 0.75 into the foot and 0.45 into the digits left.  The toe itself is moderately discolored it is plantarflexed but flexible with distal keratotic tissue formation present no odor no drainage     Assessment:  May be a very mild dry gangrene or just distal keratotic tissue possible low-grade infection of the second digit but he is not in distress currently     Plan:  H&P reviewed case with Dr. Tobie who also saw Mr. Debell with me.  We are both in agreement that vascular studies currently are inconclusive as to whether or not he can be helped with this and I am sending him for vascular consult to see whether or not circulation could be improved.  I debrided the distal tissue no open area no other pathology and x-rayed and reviewed and he still may require flexor tenotomy and ultimately still may require digital amputation depending on vascular consult and what else we can do to help  X-rays indicate does not appear to have any further absorption of bone distal second digit left foot

## 2024-02-25 ENCOUNTER — Other Ambulatory Visit (HOSPITAL_COMMUNITY): Payer: Self-pay

## 2024-03-09 ENCOUNTER — Other Ambulatory Visit: Payer: Self-pay

## 2024-03-09 ENCOUNTER — Other Ambulatory Visit (HOSPITAL_COMMUNITY): Payer: Self-pay

## 2024-03-10 ENCOUNTER — Other Ambulatory Visit (HOSPITAL_COMMUNITY): Payer: Self-pay

## 2024-03-13 DIAGNOSIS — G4733 Obstructive sleep apnea (adult) (pediatric): Secondary | ICD-10-CM | POA: Diagnosis not present

## 2024-03-15 ENCOUNTER — Telehealth: Payer: Self-pay | Admitting: Podiatry

## 2024-03-15 NOTE — Telephone Encounter (Signed)
 Hi Dr. Magdalen Checking to see if you saw this message also:  The wife is calling on behalf of the patient, stating that he was in severe pain throughout the night and unable to bear weight. She mentioned that he is currently taking 2nd round of Doxycycline  but is still concerned there may be an ongoing infection. She would like to know if you'd prefer to have him schedule another appointment for evaluation sooner than sch'ed one for 7/30 @1 :15 or if you'd like to call in a prescription to the pharmacy on file.

## 2024-03-15 NOTE — Telephone Encounter (Signed)
Please place order in chart .

## 2024-03-15 NOTE — Telephone Encounter (Signed)
 I don't see referral to Vascular entered.

## 2024-03-15 NOTE — Telephone Encounter (Signed)
 The patient was seen on 02/18/2024, and a second referral for vascular evaluation was supposed to be sent. The patient is stating that they have not received a call to make an appointment and would like to check on the status of the second referral.

## 2024-03-15 NOTE — Telephone Encounter (Signed)
 I did enter at last visit. Please go ahead and refer him to vascular for evaluation

## 2024-03-15 NOTE — Telephone Encounter (Signed)
 Called Pt to relay from CMA

## 2024-03-15 NOTE — Telephone Encounter (Signed)
 Please schedule him stat visit for vascular evaluation

## 2024-03-15 NOTE — Telephone Encounter (Signed)
 The wife is calling on behalf of the patient, stating that he was in severe pain throughout the night and unable to bear weight. She mentioned that he is currently taking 2nd round of Doxycycline  but is still concerned there may be an ongoing infection. She would like to know if you'd prefer to have him schedule another appointment for evaluation sooner than sch'ed one for 7/30 @1 :15 or if you'd like to call in a prescription to the pharmacy on file.

## 2024-03-16 DIAGNOSIS — H35033 Hypertensive retinopathy, bilateral: Secondary | ICD-10-CM | POA: Diagnosis not present

## 2024-03-16 DIAGNOSIS — H43813 Vitreous degeneration, bilateral: Secondary | ICD-10-CM | POA: Diagnosis not present

## 2024-03-16 DIAGNOSIS — H40023 Open angle with borderline findings, high risk, bilateral: Secondary | ICD-10-CM | POA: Diagnosis not present

## 2024-03-16 DIAGNOSIS — E113313 Type 2 diabetes mellitus with moderate nonproliferative diabetic retinopathy with macular edema, bilateral: Secondary | ICD-10-CM | POA: Diagnosis not present

## 2024-03-17 ENCOUNTER — Ambulatory Visit (INDEPENDENT_AMBULATORY_CARE_PROVIDER_SITE_OTHER): Admitting: Podiatry

## 2024-03-17 ENCOUNTER — Ambulatory Visit (INDEPENDENT_AMBULATORY_CARE_PROVIDER_SITE_OTHER)

## 2024-03-17 ENCOUNTER — Other Ambulatory Visit (HOSPITAL_COMMUNITY): Payer: Self-pay

## 2024-03-17 DIAGNOSIS — I999 Unspecified disorder of circulatory system: Secondary | ICD-10-CM | POA: Diagnosis not present

## 2024-03-17 DIAGNOSIS — L97522 Non-pressure chronic ulcer of other part of left foot with fat layer exposed: Secondary | ICD-10-CM | POA: Diagnosis not present

## 2024-03-17 MED ORDER — HYDROCODONE-ACETAMINOPHEN 10-325 MG PO TABS
1.0000 | ORAL_TABLET | Freq: Three times a day (TID) | ORAL | 0 refills | Status: DC | PRN
  Filled 2024-03-17: qty 15, 5d supply, fill #0

## 2024-03-17 MED ORDER — DOXYCYCLINE HYCLATE 100 MG PO TABS
100.0000 mg | ORAL_TABLET | Freq: Two times a day (BID) | ORAL | 1 refills | Status: DC
  Filled 2024-03-17 – 2024-05-12 (×2): qty 20, 10d supply, fill #0
  Filled 2024-05-26: qty 20, 10d supply, fill #1

## 2024-03-18 NOTE — Progress Notes (Signed)
 Subjective:   Patient ID: Timothy Peters, male   DOB: 73 y.o.   MRN: [id number]   HPI Patient states the toe does not seem to change a lot from a Luking standpoint but it is very sore at night and it is slightly dark and I have not been able to get into the vascular doctor yet   ROS      Objective:  Physical Exam  Neuro vascular status significantly compromised left over right with patient having moderate pain in the legs but patient having a lot of pain second digit left where there is dark discoloration of the distal half of the toe and there is very slight drainage distally.  I do not note any proximal edema erythema and there is no systemic indication of infection     Assessment:  Patient who has significant vascular disease bilateral who has developed stress and pathology second digit left which is staying localized but is very sore and waiting to get into vascular     Plan:  H&P reviewed we called vascular were finally able to get him in in the next 5 to 6 days and at this point we are going to keep him on antibiotics open toed shoe and I did put him on hydrocodone  at nighttime to try to help with that discomfort.  Patient understands that there is a strong likelihood that the second toe will need to be amputated but we are wanting vascular first before doing that.  I am watching x-rays to make sure I am not seeing changes and did not today  X-rays indicate that there is possibility for some distal absorption but it is not changed from previous visit

## 2024-03-19 ENCOUNTER — Other Ambulatory Visit: Payer: Self-pay | Admitting: Podiatry

## 2024-03-19 NOTE — Progress Notes (Signed)
 Spoke to patient after hours via telephone. He noticed increased swelling to the foot over the last 24-48 hours. Recommend going to the ED for vascular evaluation and labwork. If admitted recommend vascular consult. He has an appt with [facility] 8/5 however given the acute swelling instructed him to go the ED for more emergent evaluation. Patient agrees and is amenable to plan.   Thresa EMERSON Sar, DPM [facility]  Dr. Thresa EMERSON Sar, DPM    [address] [place], [place] [address]                Office [phone]  Fax [phone]

## 2024-03-22 ENCOUNTER — Other Ambulatory Visit: Payer: Self-pay

## 2024-03-22 ENCOUNTER — Emergency Department (HOSPITAL_COMMUNITY)

## 2024-03-22 ENCOUNTER — Inpatient Hospital Stay (HOSPITAL_COMMUNITY)
Admission: EM | Admit: 2024-03-22 | Discharge: 2024-03-26 | DRG: 253 | Disposition: A | Discharge status: Home / Self Care | Attending: Internal Medicine | Admitting: Internal Medicine

## 2024-03-22 DIAGNOSIS — E119 Type 2 diabetes mellitus without complications: Secondary | ICD-10-CM | POA: Diagnosis not present

## 2024-03-22 DIAGNOSIS — Z823 Family history of stroke: Secondary | ICD-10-CM | POA: Diagnosis not present

## 2024-03-22 DIAGNOSIS — Z8616 Personal history of COVID-19: Secondary | ICD-10-CM | POA: Diagnosis not present

## 2024-03-22 DIAGNOSIS — E785 Hyperlipidemia, unspecified: Secondary | ICD-10-CM | POA: Diagnosis not present

## 2024-03-22 DIAGNOSIS — M869 Osteomyelitis, unspecified: Secondary | ICD-10-CM | POA: Diagnosis present

## 2024-03-22 DIAGNOSIS — E669 Obesity, unspecified: Secondary | ICD-10-CM | POA: Diagnosis present

## 2024-03-22 DIAGNOSIS — I10 Essential (primary) hypertension: Secondary | ICD-10-CM | POA: Diagnosis present

## 2024-03-22 DIAGNOSIS — E11621 Type 2 diabetes mellitus with foot ulcer: Secondary | ICD-10-CM | POA: Diagnosis present

## 2024-03-22 DIAGNOSIS — Z89422 Acquired absence of other left toe(s): Secondary | ICD-10-CM | POA: Diagnosis not present

## 2024-03-22 DIAGNOSIS — Q630 Accessory kidney: Secondary | ICD-10-CM | POA: Diagnosis not present

## 2024-03-22 DIAGNOSIS — Z8249 Family history of ischemic heart disease and other diseases of the circulatory system: Secondary | ICD-10-CM

## 2024-03-22 DIAGNOSIS — Z981 Arthrodesis status: Secondary | ICD-10-CM

## 2024-03-22 DIAGNOSIS — Z7985 Long-term (current) use of injectable non-insulin antidiabetic drugs: Secondary | ICD-10-CM | POA: Diagnosis not present

## 2024-03-22 DIAGNOSIS — Z9862 Peripheral vascular angioplasty status: Secondary | ICD-10-CM | POA: Diagnosis not present

## 2024-03-22 DIAGNOSIS — I96 Gangrene, not elsewhere classified: Secondary | ICD-10-CM | POA: Diagnosis not present

## 2024-03-22 DIAGNOSIS — L089 Local infection of the skin and subcutaneous tissue, unspecified: Principal | ICD-10-CM | POA: Diagnosis present

## 2024-03-22 DIAGNOSIS — M86172 Other acute osteomyelitis, left ankle and foot: Secondary | ICD-10-CM

## 2024-03-22 DIAGNOSIS — Z743 Need for continuous supervision: Secondary | ICD-10-CM | POA: Diagnosis not present

## 2024-03-22 DIAGNOSIS — Z794 Long term (current) use of insulin: Secondary | ICD-10-CM | POA: Diagnosis not present

## 2024-03-22 DIAGNOSIS — S91105A Unspecified open wound of left lesser toe(s) without damage to nail, initial encounter: Secondary | ICD-10-CM | POA: Diagnosis not present

## 2024-03-22 DIAGNOSIS — I70292 Other atherosclerosis of native arteries of extremities, left leg: Secondary | ICD-10-CM | POA: Diagnosis not present

## 2024-03-22 DIAGNOSIS — Z7982 Long term (current) use of aspirin: Secondary | ICD-10-CM | POA: Diagnosis not present

## 2024-03-22 DIAGNOSIS — Z79899 Other long term (current) drug therapy: Secondary | ICD-10-CM

## 2024-03-22 DIAGNOSIS — Z833 Family history of diabetes mellitus: Secondary | ICD-10-CM | POA: Diagnosis not present

## 2024-03-22 DIAGNOSIS — E11628 Type 2 diabetes mellitus with other skin complications: Secondary | ICD-10-CM | POA: Diagnosis present

## 2024-03-22 DIAGNOSIS — M19072 Primary osteoarthritis, left ankle and foot: Secondary | ICD-10-CM | POA: Diagnosis not present

## 2024-03-22 DIAGNOSIS — E1140 Type 2 diabetes mellitus with diabetic neuropathy, unspecified: Secondary | ICD-10-CM | POA: Diagnosis not present

## 2024-03-22 DIAGNOSIS — E1151 Type 2 diabetes mellitus with diabetic peripheral angiopathy without gangrene: Principal | ICD-10-CM | POA: Diagnosis present

## 2024-03-22 DIAGNOSIS — L97529 Non-pressure chronic ulcer of other part of left foot with unspecified severity: Secondary | ICD-10-CM | POA: Diagnosis not present

## 2024-03-22 DIAGNOSIS — I70262 Atherosclerosis of native arteries of extremities with gangrene, left leg: Secondary | ICD-10-CM | POA: Diagnosis not present

## 2024-03-22 DIAGNOSIS — E1152 Type 2 diabetes mellitus with diabetic peripheral angiopathy with gangrene: Secondary | ICD-10-CM | POA: Diagnosis not present

## 2024-03-22 DIAGNOSIS — Z8619 Personal history of other infectious and parasitic diseases: Secondary | ICD-10-CM | POA: Diagnosis not present

## 2024-03-22 DIAGNOSIS — G4730 Sleep apnea, unspecified: Secondary | ICD-10-CM | POA: Diagnosis present

## 2024-03-22 DIAGNOSIS — K429 Umbilical hernia without obstruction or gangrene: Secondary | ICD-10-CM | POA: Diagnosis not present

## 2024-03-22 DIAGNOSIS — K8050 Calculus of bile duct without cholangitis or cholecystitis without obstruction: Secondary | ICD-10-CM | POA: Diagnosis not present

## 2024-03-22 DIAGNOSIS — I70222 Atherosclerosis of native arteries of extremities with rest pain, left leg: Secondary | ICD-10-CM | POA: Diagnosis not present

## 2024-03-22 DIAGNOSIS — M7989 Other specified soft tissue disorders: Secondary | ICD-10-CM | POA: Diagnosis not present

## 2024-03-22 DIAGNOSIS — E1169 Type 2 diabetes mellitus with other specified complication: Secondary | ICD-10-CM | POA: Diagnosis present

## 2024-03-22 DIAGNOSIS — E1165 Type 2 diabetes mellitus with hyperglycemia: Secondary | ICD-10-CM

## 2024-03-22 DIAGNOSIS — Z7984 Long term (current) use of oral hypoglycemic drugs: Secondary | ICD-10-CM | POA: Diagnosis not present

## 2024-03-22 DIAGNOSIS — Z87891 Personal history of nicotine dependence: Secondary | ICD-10-CM | POA: Diagnosis not present

## 2024-03-22 DIAGNOSIS — M79672 Pain in left foot: Secondary | ICD-10-CM | POA: Diagnosis not present

## 2024-03-22 LAB — CBC WITH DIFFERENTIAL/PLATELET
Abs Granulocyte: 3.6 K/uL (ref 1.5–6.5)
Abs Immature Granulocytes: 0.03 K/uL (ref 0.00–0.07)
Basophils Absolute: 0.1 K/uL (ref 0.0–0.1)
Basophils Relative: 1 %
Eosinophils Absolute: 2.2 K/uL — ABNORMAL HIGH (ref 0.0–0.5)
Eosinophils Relative: 25 %
HCT: 41.1 % (ref 39.0–52.0)
Hemoglobin: 13 g/dL (ref 13.0–17.0)
Immature Granulocytes: 0 %
Lymphocytes Relative: 25 %
Lymphs Abs: 2.2 K/uL (ref 0.7–4.0)
MCH: 31 pg (ref 26.0–34.0)
MCHC: 31.6 g/dL (ref 30.0–36.0)
MCV: 98.1 fL (ref 80.0–100.0)
Monocytes Absolute: 0.5 K/uL (ref 0.1–1.0)
Monocytes Relative: 6 %
Neutro Abs: 3.6 K/uL (ref 1.7–7.7)
Neutrophils Relative %: 43 %
Platelets: 307 K/uL (ref 150–400)
RBC: 4.19 MIL/uL — ABNORMAL LOW (ref 4.22–5.81)
RDW: 12 % (ref 11.5–15.5)
WBC: 8.6 K/uL (ref 4.0–10.5)
nRBC: 0 % (ref 0.0–0.2)

## 2024-03-22 LAB — COMPREHENSIVE METABOLIC PANEL WITH GFR
ALT: 12 U/L (ref 0–44)
AST: 13 U/L — ABNORMAL LOW (ref 15–41)
Albumin: 3.6 g/dL (ref 3.5–5.0)
Alkaline Phosphatase: 77 U/L (ref 38–126)
Anion gap: 10 (ref 5–15)
BUN: 22 mg/dL (ref 8–23)
CO2: 26 mmol/L (ref 22–32)
Calcium: 9.6 mg/dL (ref 8.9–10.3)
Chloride: 103 mmol/L (ref 98–111)
Creatinine, Ser: 1.12 mg/dL (ref 0.61–1.24)
GFR, Estimated: >60 mL/min (ref >60)
Glucose, Bld: 177 mg/dL — ABNORMAL HIGH (ref 70–99)
Potassium: 4.5 mmol/L (ref 3.5–5.1)
Sodium: 139 mmol/L (ref 135–145)
Total Bilirubin: 0.7 mg/dL (ref 0.0–1.2)
Total Protein: 7.9 g/dL (ref 6.5–8.1)

## 2024-03-22 LAB — URINALYSIS, ROUTINE W REFLEX MICROSCOPIC
Bacteria, UA: NONE SEEN
Bilirubin Urine: NEGATIVE
Glucose, UA: >=500 mg/dL — AB
Hgb urine dipstick: NEGATIVE
Ketones, ur: NEGATIVE mg/dL
Leukocytes,Ua: NEGATIVE
Nitrite: NEGATIVE
Protein, ur: NEGATIVE mg/dL
Specific Gravity, Urine: 1.027 (ref 1.005–1.030)
pH: 6 (ref 5.0–8.0)

## 2024-03-22 LAB — PROTIME-INR
INR: 0.9 (ref 0.8–1.2)
Prothrombin Time: 12.7 s (ref 11.4–15.2)

## 2024-03-22 LAB — CBG MONITORING, ED: Glucose-Capillary: 89 mg/dL (ref 70–99)

## 2024-03-22 LAB — I-STAT CG4 LACTIC ACID, ED: Lactic Acid, Venous: 1.7 mmol/L (ref 0.5–1.9)

## 2024-03-22 LAB — GLUCOSE, CAPILLARY: Glucose-Capillary: 182 mg/dL — ABNORMAL HIGH (ref 70–99)

## 2024-03-22 MED ORDER — INSULIN ASPART 100 UNIT/ML IJ SOLN
0.0000 [IU] | Freq: Every day | INTRAMUSCULAR | Status: DC
  Filled 2024-03-22: qty 0.05

## 2024-03-22 MED ORDER — INSULIN ASPART 100 UNIT/ML IJ SOLN
0.0000 [IU] | Freq: Three times a day (TID) | INTRAMUSCULAR | Status: DC
  Administered 2024-03-23: 3 [IU] via SUBCUTANEOUS
  Administered 2024-03-24: 2 [IU] via SUBCUTANEOUS
  Administered 2024-03-24: 5 [IU] via SUBCUTANEOUS
  Administered 2024-03-25: 3 [IU] via SUBCUTANEOUS
  Filled 2024-03-22: qty 0.15

## 2024-03-22 MED ORDER — VANCOMYCIN HCL 1750 MG/350ML IV SOLN
1750.0000 mg | Freq: Every day | INTRAVENOUS | Status: DC
  Administered 2024-03-23 – 2024-03-25 (×3): 1750 mg via INTRAVENOUS
  Filled 2024-03-22 (×3): qty 350

## 2024-03-22 MED ORDER — SODIUM CHLORIDE 0.9 % IV SOLN
1.0000 g | INTRAVENOUS | Status: DC
  Administered 2024-03-23 – 2024-03-24 (×2): 1 g via INTRAVENOUS
  Filled 2024-03-22 (×3): qty 10

## 2024-03-22 MED ORDER — LOSARTAN POTASSIUM 50 MG PO TABS
100.0000 mg | ORAL_TABLET | Freq: Every day | ORAL | Status: DC
  Administered 2024-03-23 – 2024-03-26 (×3): 100 mg via ORAL
  Filled 2024-03-22 (×3): qty 2

## 2024-03-22 MED ORDER — ACETAMINOPHEN 325 MG PO TABS
650.0000 mg | ORAL_TABLET | Freq: Four times a day (QID) | ORAL | Status: DC | PRN
  Administered 2024-03-22: 650 mg via ORAL
  Filled 2024-03-22: qty 2

## 2024-03-22 MED ORDER — VANCOMYCIN HCL 1750 MG/350ML IV SOLN
1750.0000 mg | Freq: Once | INTRAVENOUS | Status: AC
  Administered 2024-03-22: 1750 mg via INTRAVENOUS
  Filled 2024-03-22: qty 350

## 2024-03-22 MED ORDER — TRAZODONE HCL 50 MG PO TABS
25.0000 mg | ORAL_TABLET | Freq: Every evening | ORAL | Status: DC | PRN
  Administered 2024-03-22 – 2024-03-24 (×2): 25 mg via ORAL
  Filled 2024-03-22 (×2): qty 1

## 2024-03-22 MED ORDER — GABAPENTIN 300 MG PO CAPS
300.0000 mg | ORAL_CAPSULE | Freq: Four times a day (QID) | ORAL | Status: DC
  Administered 2024-03-22 – 2024-03-26 (×14): 300 mg via ORAL
  Filled 2024-03-22 (×14): qty 1

## 2024-03-22 MED ORDER — ALBUTEROL SULFATE (2.5 MG/3ML) 0.083% IN NEBU: 2.5000 mg | INHALATION_SOLUTION | RESPIRATORY_TRACT | Status: DC | PRN

## 2024-03-22 MED ORDER — DULOXETINE HCL 60 MG PO CPEP
60.0000 mg | ORAL_CAPSULE | Freq: Every day | ORAL | Status: DC
  Administered 2024-03-23 – 2024-03-26 (×4): 60 mg via ORAL
  Filled 2024-03-22 (×4): qty 1

## 2024-03-22 MED ORDER — INSULIN GLARGINE-YFGN 100 UNIT/ML ~~LOC~~ SOLN
28.0000 [IU] | Freq: Every day | SUBCUTANEOUS | Status: DC
  Administered 2024-03-23 – 2024-03-24 (×2): 28 [IU] via SUBCUTANEOUS
  Filled 2024-03-22 (×2): qty 0.28

## 2024-03-22 MED ORDER — ONDANSETRON HCL 4 MG/2ML IJ SOLN: 4.0000 mg | Freq: Four times a day (QID) | INTRAMUSCULAR | Status: DC | PRN

## 2024-03-22 MED ORDER — SIMVASTATIN 20 MG PO TABS: 20.0000 mg | ORAL_TABLET | Freq: Every evening | ORAL | Status: DC

## 2024-03-22 MED ORDER — OXYCODONE HCL 5 MG PO TABS
5.0000 mg | ORAL_TABLET | ORAL | Status: DC | PRN
  Administered 2024-03-22: 5 mg via ORAL
  Filled 2024-03-22 (×2): qty 1

## 2024-03-22 MED ORDER — SIMVASTATIN 20 MG PO TABS
20.0000 mg | ORAL_TABLET | Freq: Every evening | ORAL | Status: DC
  Administered 2024-03-22 – 2024-03-24 (×3): 20 mg via ORAL
  Filled 2024-03-22 (×3): qty 1

## 2024-03-22 MED ORDER — INSULIN DEGLUDEC 100 UNIT/ML ~~LOC~~ SOPN: 28.0000 [IU] | PEN_INJECTOR | Freq: Every morning | SUBCUTANEOUS | Status: DC

## 2024-03-22 MED ORDER — METOPROLOL SUCCINATE ER 25 MG PO TB24
25.0000 mg | ORAL_TABLET | Freq: Every day | ORAL | Status: DC
  Administered 2024-03-23 – 2024-03-26 (×4): 25 mg via ORAL
  Filled 2024-03-22 (×4): qty 1

## 2024-03-22 MED ORDER — ACETAMINOPHEN 650 MG RE SUPP: 650.0000 mg | Freq: Four times a day (QID) | RECTAL | Status: DC | PRN

## 2024-03-22 MED ORDER — SODIUM CHLORIDE 0.9 % IV SOLN
2.0000 g | Freq: Once | INTRAVENOUS | Status: AC
  Administered 2024-03-22: 2 g via INTRAVENOUS
  Filled 2024-03-22: qty 20

## 2024-03-22 MED ORDER — HYDROMORPHONE HCL 1 MG/ML IJ SOLN
0.5000 mg | INTRAMUSCULAR | Status: DC | PRN
  Administered 2024-03-22 – 2024-03-26 (×12): 1 mg via INTRAVENOUS
  Filled 2024-03-22 (×12): qty 1

## 2024-03-22 MED ORDER — ONDANSETRON HCL 4 MG PO TABS: 4.0000 mg | ORAL_TABLET | Freq: Four times a day (QID) | ORAL | Status: DC | PRN

## 2024-03-22 MED ORDER — IOHEXOL 350 MG/ML SOLN
100.0000 mL | Freq: Once | INTRAVENOUS | Status: AC | PRN
  Administered 2024-03-22: 100 mL via INTRAVENOUS

## 2024-03-22 MED ORDER — ENOXAPARIN SODIUM 40 MG/0.4ML IJ SOSY
40.0000 mg | PREFILLED_SYRINGE | INTRAMUSCULAR | Status: DC
  Administered 2024-03-23: 40 mg via SUBCUTANEOUS
  Filled 2024-03-22: qty 0.4

## 2024-03-22 MED ORDER — ENOXAPARIN SODIUM 40 MG/0.4ML IJ SOSY
40.0000 mg | PREFILLED_SYRINGE | INTRAMUSCULAR | Status: DC
  Filled 2024-03-22: qty 0.4

## 2024-03-22 NOTE — ED Notes (Signed)
 Informed RN of pt's BP readings.

## 2024-03-22 NOTE — ED Triage Notes (Signed)
 Patient to ED by POV with c/o left foot pain. Patient is a diabetic and had surgery to L foot 2 months ago. He had top of toe amputated and is being followed by ortho. He c/o swelling and discoloration to L foot. Currently on 2nd round of ABX Ortho MD told patient to f/u in ED.

## 2024-03-22 NOTE — ED Provider Notes (Signed)
 [place] EMERGENCY DEPARTMENT AT [facility] Provider Note   CSN: [id number] Arrival date & time: 03/22/24  1012     Patient presents with: Foot Injury   Timothy Peters is a 73 y.o. male.   73 year old male with past medical history of diabetes and hypertension presenting to the emergency department today with worsening swelling of the second toe on his left foot.  The patient has seen podiatry.  He is on his second round of oral antibiotics for this.  He reports that he is having some pain with this and that it is swollen and has been having worsening swelling over the past few days.  He came to the ER for further evaluation regarding this.  The patient denies any fevers.  Denies any known injuries.  Looking back through the patient's most recent podiatry note is recommend vascular evaluation if symptoms are to worsen.  He was scheduled to have further evaluation at his adult patient with vascular surgery but due to his worsening symptoms he was told to come to the ER for further evaluation.   Foot Injury      Prior to Admission medications   Medication Sig Start Date End Date Taking? Authorizing Provider  Accu-Chek Softclix Lancets lancets Test up to 4 times a day as directed 07/09/22   Jillian Buttery, MD  aspirin  81 MG tablet Take 1 tablet (81 mg total) by mouth daily. Resume 4 days post-op Patient taking differently: Take 81 mg by mouth daily. 10/30/17   Bissell, Jaclyn M, PA-C  bisacodyl  5 MG EC tablet Take by mouth as directed 06/25/23     blood glucose meter kit and supplies Use as directed up to 4 times daily 07/09/22   Jillian Buttery, MD  Continuous Glucose Sensor (DEXCOM G7 SENSOR) MISC [place]  as directed to continuously monitor blood glucose, replacing sensor every 10 days 06/30/23     cyclobenzaprine  (FLEXERIL ) 10 MG tablet Take 1 tablet (10 mg total) by mouth 3 (three) times daily as needed for muscle spasms 08/21/23     dapagliflozin  propanediol (FARXIGA ) 10 MG  TABS tablet Take 1 tablet (10 mg total) by mouth daily. 09/26/23     docusate sodium  (COLACE) 100 MG capsule Take 1 capsule (100 mg total) by mouth 2 (two) times daily. 04/13/21   Bergman, Meghan D, NP  doxycycline  (VIBRA -TABS) 100 MG tablet Take 1 tablet (100 mg total) by mouth 2 (two) times daily. 03/17/24   Magdalen Pasco RAMAN, DPM  DULoxetine  (CYMBALTA ) 60 MG capsule Take 1 capsule (60 mg total) by mouth daily. 01/21/24     Ferrous Sulfate  (IRON PO) Take 1 tablet by mouth daily.    [provider]  gabapentin  (NEURONTIN ) 300 MG capsule Take 1 capsule (300 mg total) by mouth 4 (four) times daily. 02/05/22     gabapentin  (NEURONTIN ) 300 MG capsule Take 1 capsule (300 mg total) by mouth 4 (four) times daily. 04/01/23     glipiZIDE  (GLUCOTROL ) 5 MG tablet Take 1 tablet (5 mg total) by mouth daily if blood sugar is >150 mg/dL. 09/26/23     glucose blood test strip Use as instructed 12/29/15   Tobie Lockwood V, MD  glucose blood test strip Use to check blood sugar once daily. 08/01/22   [name], [name], MD  HYDROcodone -acetaminophen  (NORCO) 10-325 MG tablet Take 1 tablet by mouth every 8 (eight) hours as needed for up to 5 days. 03/17/24 03/22/24  Magdalen Pasco RAMAN, DPM  insulin  degludec (TRESIBA  FLEXTOUCH) 100 UNIT/ML  FlexTouch Pen Inject 28 Units into the skin daily. 09/26/23     Insulin  Glargine (BASAGLAR  KWIKPEN) 100 UNIT/ML Inject 28 Units into the skin daily at any time of the day, but at the SAME time every day 09/25/22     Insulin  Pen Needle (NOVOFINE PEN NEEDLE) 32G X 6 MM MISC Use as directed 12/23/22     Insulin  Pen Needle 31G X 5 MM MISC Use with lantus  daily. 08/06/21   [name], [name], MD  losartan  (COZAAR ) 100 MG tablet Take 1 tablet (100 mg total) by mouth daily. 09/26/23     metFORMIN  (GLUCOPHAGE ) 1000 MG tablet Take 1 tablet (1,000 mg total) by mouth 2 (two) times daily with a meal 01/28/23     metFORMIN  (GLUCOPHAGE ) 1000 MG tablet Take 1 tablet (1,000 mg total) by mouth 2 (two) times daily with a meal.  09/26/23     metoprolol  succinate (TOPROL -XL) 25 MG 24 hr tablet Take 1 tablet (25 mg total) by mouth daily. 09/26/23     polyethylene glycol-electrolytes (NULYTELY ) 420 g solution Take as directed 06/25/23     Semaglutide , 1 MG/DOSE, (OZEMPIC , 1 MG/DOSE,) 4 MG/3ML SOPN Inject 1 mg into the skin once a week. 09/26/23     sildenafil  (VIAGRA ) 100 MG tablet Take 1 tablet (100 mg total) by mouth daily if needed. 11/04/22     simvastatin  (ZOCOR ) 20 MG tablet Take 1 tablet (20 mg total) by mouth every evening. 09/25/23     simvastatin  (ZOCOR ) 20 MG tablet Take 1 tablet (20 mg total) by mouth every evening. 09/26/23       Allergies: Patient has no known allergies.    Review of Systems  Musculoskeletal:        Toe/foot swelling  All other systems reviewed and are negative.   Updated Vital Signs BP (!) 147/77 (BP Location: Left Arm)   Pulse (!) 50   Temp (!) 97.3 F (36.3 C) (Oral)   Resp 18   Ht 5' 11 (1.803 m)   Wt 87.5 kg   SpO2 98%   BMI 26.92 kg/m   Physical Exam Vitals and nursing note reviewed.   Gen: NAD Eyes: PERRL, EOMI HEENT: no oropharyngeal swelling Neck: trachea midline Resp: clear to auscultation bilaterally Card: RRR, no murmurs, rubs, or gallops Abd: nontender, nondistended Extremities: no calf tenderness, no edema, there is swelling noted to the second toe on the left foot Vascular: 2+ radial pulses bilaterally, trace DP pulse on the left Skin: no rashes Psyc: acting appropriately   (all labs ordered are listed, but only abnormal results are displayed) Labs Reviewed  URINALYSIS, ROUTINE W REFLEX MICROSCOPIC - Abnormal; Notable for the following components:      Result Value   Glucose, UA >=500 (*)    All other components within normal limits  COMPREHENSIVE METABOLIC PANEL WITH GFR - Abnormal; Notable for the following components:   Glucose, Bld 177 (*)    AST 13 (*)    All other components within normal limits  CBC WITH DIFFERENTIAL/PLATELET - Abnormal; Notable  for the following components:   RBC 4.19 (*)    Eosinophils Absolute 2.2 (*)    All other components within normal limits  CULTURE, BLOOD (ROUTINE X 2)  CULTURE, BLOOD (ROUTINE X 2)  PROTIME-INR  I-STAT CG4 LACTIC ACID, ED  I-STAT CG4 LACTIC ACID, ED    EKG: None  Radiology: CT ANGIO LOWER EXT BILAT W &/OR WO CONTRAST Result Date: 03/22/2024 EXAM: CTA ABDOMEN AND PELVIS WITHOUT AND WITH  CONTRAST AND RUNOFF CTA OF THE LOWER EXTREMITIES WITH CONTRAST 03/22/2024 01:06:01 PM TECHNIQUE: CTA images of the abdomen, pelvis and lower extremities without and with intravenous contrast. Three-dimensional MIP/volume rendered formations were performed. Automated exposure control, iterative reconstruction, and/or weight based adjustment of the mA/kV was utilized to reduce the radiation dose to as low as reasonably achievable. COMPARISON: 12/11/2020 CLINICAL HISTORY: Claudication or leg ischemia. c/o left foot pain. Patient is a diabetic and had surgery to L foot 2 months ago. He had top of toe amputated and is being followed by ortho. He c/o swelling and discoloration to L foot. FINDINGS: VASCULATURE: AORTA: Mild calcified plaque in the infrarenal abdominal aorta. No abdominal aortic aneurysm. No dissection. CELIAC TRUNK: No acute finding. No occlusion or significant stenosis. SUPERIOR MESENTERIC ARTERY: No acute finding. No occlusion or significant stenosis. INFERIOR MESENTERIC ARTERY: No acute finding. No occlusion or significant stenosis. RENAL ARTERIES: Single left renal artery, patent. Duplicated right renal arteries, superior dominant, both patent. No occlusion or significant stenosis. RIGHT ILIAC ARTERIES: Mild plaque in the common and external iliac arteries without aneurysmal disease. No occlusion or significant stenosis. RIGHT FEMORAL SRTERIES: Mild plaque in the common femoral artery. Scattered non-occlusive plaque in the distal sfa. No occlusion or significant stenosis. RIGHT POPLITEAL ARTERY: Scattered  plaque in the proximal to mid popliteal artery with only mild short-segment stenosis. RIGHT CALF ARTERIES: Limited evaluation of tibial runoff. LEFT ILIAC ARTERIES: Moderate plaque in the common iliac in proximal external iliac without stenosis. No occlusion or significant stenosis. LEFT FEMORAL ARTERIES: Eccentric non-occlusive plaque in the common femoral artery. Mild scattered non-occlusive plaque in the mid and distal sfa trefication incompletely characterized due to scan timing. No occlusion or significant stenosis. LEFT POPLITEAL ARTERY: No acute finding. No occlusion or significant stenosis. LEFT CALF ARTERIES: No acute finding. No occlusion or significant stenosis. ABDOMEN AND PELVIS: LOWER CHEST: Dependent atelectasis posteriorly in the lung bases. LIVER: The liver is unremarkable. GALLBLADDER AND BILE DUCTS: A few subcentimeter partially calcified stones are layered in the dependent aspect of the nondilated gallbladder. No biliary ductal dilatation. SPLEEN: The spleen is unremarkable. PANCREAS: The pancreas is unremarkable. ADRENAL GLANDS: Bilateral adrenal glands demonstrate no acute abnormality. KIDNEYS, URETERS AND BLADDER: No stones in the kidneys or ureters. No hydronephrosis. No evidence of perinephric or periureteral stranding. Urinary bladder is unremarkable. GI AND Bowel: Stomach and duodenal sweep demonstrate no acute abnormality. There is no bowel obstruction. No abnormal bowel wall thickening or distension. Normal appendix. REPRODUCTIVE: Mild prostate enlargement. PERITONEUM AND RETRPERITONEUM: No ascites or free air. Small umbilical hernia containing only mesenteric fat. LYMPH NODES: No evidence of lymphadenopathy. BONES AND SOFT TISSUES: Instrumented fusion L3-L5. No acute abnormality of the bones. No acute soft tissue abnormality. IMPRESSION: 1. No occlusion or hemodynamically significant stenosis of the arterial system of the abdomen, pelvis, or bilateral lower extremities. Limited  evaluation of tibial runoff, however. 2. Mild calcified plaque in the infrarenal abdominal aorta. 3. Small umbilical hernia Electronically signed by: Katheleen Faes MD 03/22/2024 03:03 PM EDT RP Workstation: [id number]   DG Foot Complete Left Result Date: 03/22/2024 CLINICAL DATA:  Diabetic with left foot pain. Surgery to left foot 2 months ago. Possible second toe osteomyelitis. EXAM: LEFT FOOT - COMPLETE 3+ VIEW COMPARISON:  03/17/2024, 02/18/2024 and 01/07/2024 FINDINGS: Minimal degenerative change of the first MTP joint. Evidence of patient's previous amputation distal to the base of the second distal phalanx. Subtle air over the very superficial distal soft tissues of the second toe without significant  change from 03/17/2024. No evidence of adjacent bone destruction to suggest osteomyelitis. Remainder of the exam is unchanged. IMPRESSION: 1. No acute findings. 2. 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 Subtle focus of air over the superficial distal soft tissues of the second toe without significant change from 03/17/2024. No evidence of adjacent bone destruction to suggest osteomyelitis. Electronically Signed   By: Toribio Agreste M.D.   On: 03/22/2024 11:50     Procedures   Medications Ordered in the ED  vancomycin  (VANCOREADY) IVPB 1750 mg/350 mL (1,750 mg Intravenous New Bag/Given 03/22/24 1402)  cefTRIAXone  (ROCEPHIN ) 2 g in sodium chloride  0.9 % 100 mL IVPB (0 g Intravenous Stopped 03/22/24 1400)  iohexol  (OMNIPAQUE ) 350 MG/ML injection 100 mL (100 mLs Intravenous Contrast Given 03/22/24 1247)                                    Medical Decision Making 74 year old male with past medical history of diabetes and hypertension presenting to the emergency department today with pain and swelling of the second toe on the left foot.  Will further evaluate the patient here with basic labs and obtain blood cultures.  Will obtain an x-ray as well as CT angiogram for vascular  assessment here.  Suspect that this is likely due to diabetic foot infection versus osteomyelitis.  I will cover the patient with IV antibiotics and he will require admission.  The patient's x-ray does not show any obvious findings consistent with osteomyelitis.  CT angiogram does not show any concerning findings.  This appears to be a failure of outpatient antibiotic therapies as the patient has been on oral antibiotics.  Calls placed to hospitalist service for IV antibiotic treatment and for further evaluation by podiatry for likely amputation.  Amount and/or Complexity of Data Reviewed Labs: ordered. Radiology: ordered.  Risk Prescription drug management. Decision regarding hospitalization.        Final diagnoses:  Left foot infection    ED Discharge Orders     None          Ula Prentice SAUNDERS, MD 03/22/24 1526
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
## 2024-03-22 NOTE — Progress Notes (Addendum)
 Pharmacy Antibiotic Note  Timothy Peters is a 73 y.o. male admitted on 03/22/2024 with cellulitis.  Pharmacy has been consulted for vancomycin  dosing.  Plan: Vancomycin  1750 mg IV q24 hr (est AUC 479 based on SCr 1.12; Vd 0.72) Measure vancomycin  AUC at steady state as indicated SCr q48 while on vanc Ceftriaxone  per MD; dosing appropriate   Height: 5' 11 (180.3 cm) Weight: 87.5 kg (193 lb) IBW/kg (Calculated) : 75.3  Temp (24hrs), Avg:97.9 F (36.6 C), Min:97.3 F (36.3 C), Max:98.4 F (36.9 C)  Recent Labs  Lab 03/22/24 1100 03/22/24 1104  WBC 8.6  --   CREATININE 1.12  --   LATICACIDVEN  --  1.7    Estimated Creatinine Clearance: 64.4 mL/min (by C-G formula based on SCr of 1.12 mg/dL).    No Known Allergies    Thank you for allowing pharmacy to be a part of this patient's care.  Chance Munter A 03/22/2024 4:13 PM

## 2024-03-22 NOTE — Progress Notes (Signed)
 Pharmacy Note   A consult was received from an ED physician for vancomycin  per pharmacy dosing.    The patient's profile has been reviewed for ht/wt/allergies/indication/available labs.    A one time order has been placed for vancomycin  1750 mg IV x1 .    Further antibiotics/pharmacy consults should be ordered by admitting physician if indicated.                       Thank you,  Nithya Meriweather, PharmD, BCPS 03/22/2024 11:20 AM

## 2024-03-23 ENCOUNTER — Ambulatory Visit: Admitting: Vascular Surgery

## 2024-03-23 ENCOUNTER — Encounter (HOSPITAL_COMMUNITY): Payer: Self-pay | Admitting: Internal Medicine

## 2024-03-23 DIAGNOSIS — M86172 Other acute osteomyelitis, left ankle and foot: Secondary | ICD-10-CM

## 2024-03-23 DIAGNOSIS — E1151 Type 2 diabetes mellitus with diabetic peripheral angiopathy without gangrene: Secondary | ICD-10-CM | POA: Diagnosis not present

## 2024-03-23 DIAGNOSIS — L089 Local infection of the skin and subcutaneous tissue, unspecified: Secondary | ICD-10-CM | POA: Diagnosis not present

## 2024-03-23 DIAGNOSIS — S91105A Unspecified open wound of left lesser toe(s) without damage to nail, initial encounter: Secondary | ICD-10-CM | POA: Diagnosis not present

## 2024-03-23 DIAGNOSIS — I70292 Other atherosclerosis of native arteries of extremities, left leg: Secondary | ICD-10-CM | POA: Diagnosis not present

## 2024-03-23 DIAGNOSIS — E11628 Type 2 diabetes mellitus with other skin complications: Secondary | ICD-10-CM | POA: Diagnosis not present

## 2024-03-23 DIAGNOSIS — I70222 Atherosclerosis of native arteries of extremities with rest pain, left leg: Secondary | ICD-10-CM

## 2024-03-23 LAB — SURGICAL PCR SCREEN
MRSA, PCR: NEGATIVE
Staphylococcus aureus: NEGATIVE

## 2024-03-23 LAB — BASIC METABOLIC PANEL WITH GFR
Anion gap: 9 (ref 5–15)
BUN: 16 mg/dL (ref 8–23)
CO2: 23 mmol/L (ref 22–32)
Calcium: 9.1 mg/dL (ref 8.9–10.3)
Chloride: 107 mmol/L (ref 98–111)
Creatinine, Ser: 0.91 mg/dL (ref 0.61–1.24)
GFR, Estimated: >60 mL/min (ref >60)
Glucose, Bld: 111 mg/dL — ABNORMAL HIGH (ref 70–99)
Potassium: 4.9 mmol/L (ref 3.5–5.1)
Sodium: 139 mmol/L (ref 135–145)

## 2024-03-23 LAB — GLUCOSE, CAPILLARY
Glucose-Capillary: 98 mg/dL (ref 70–99)
Glucose-Capillary: 163 mg/dL — ABNORMAL HIGH (ref 70–99)
Glucose-Capillary: 107 mg/dL — ABNORMAL HIGH (ref 70–99)
Glucose-Capillary: 92 mg/dL (ref 70–99)

## 2024-03-23 LAB — CBC
HCT: 35.1 % — ABNORMAL LOW (ref 39.0–52.0)
Hemoglobin: 11.9 g/dL — ABNORMAL LOW (ref 13.0–17.0)
MCH: 31.6 pg (ref 26.0–34.0)
MCHC: 33.9 g/dL (ref 30.0–36.0)
MCV: 93.4 fL (ref 80.0–100.0)
Platelets: 263 K/uL (ref 150–400)
RBC: 3.76 MIL/uL — ABNORMAL LOW (ref 4.22–5.81)
RDW: 12.1 % (ref 11.5–15.5)
WBC: 7.6 K/uL (ref 4.0–10.5)
nRBC: 0.3 % — ABNORMAL HIGH (ref 0.0–0.2)

## 2024-03-23 LAB — HEMOGLOBIN A1C
Hgb A1c MFr Bld: 8.7 % — ABNORMAL HIGH (ref 4.8–5.6)
Mean Plasma Glucose: 203 mg/dL

## 2024-03-23 MED ORDER — METFORMIN HCL 500 MG PO TABS: 1000.0000 mg | ORAL_TABLET | Freq: Two times a day (BID) | ORAL | Status: DC

## 2024-03-23 MED ORDER — MUPIROCIN 2 % EX OINT: 1.0000 | TOPICAL_OINTMENT | Freq: Two times a day (BID) | CUTANEOUS | Status: DC

## 2024-03-23 MED ORDER — ASPIRIN 81 MG PO CHEW
81.0000 mg | CHEWABLE_TABLET | Freq: Every day | ORAL | Status: DC
  Administered 2024-03-23 – 2024-03-26 (×3): 81 mg via ORAL
  Filled 2024-03-23 (×3): qty 1

## 2024-03-23 NOTE — TOC CM/SW Note (Signed)
 Transition of Care [facility]) - Inpatient Brief Assessment   Patient Details  Name: Timothy Peters MRN: [id number] Date of Birth: 1950/10/23  [facility]) CM/SW Contact:    Tom-Johnson, Harvest Muskrat, RN Phone Number: 03/23/2024, 3:24 PM   Clinical Narrative:  Patient presented to the ED with worsening Swelling and Pain in the Lt 2nd Toe. Patient is followed by outpatient Orthopedic and referred to the ED for further eval. Vascular and Ortho following. Currently on IV abx. Plan for amputation of Toe this week either Thursday 03/25/24 or Friday 03/26/24.   From home with wife, has two supportive children. Retired, wife transports to and from appointments. Has a cane, walker and shower seat at home.  PCP is Ransom Other, MD and uses [facility].   Patient not Medically ready for discharge.  CM will continue to follow as patient progresses with care towards discharge.            Transition of Care Asessment: Insurance and Status: Insurance coverage has been reviewed Patient has primary care physician: Yes Home environment has been reviewed: Yes Prior level of function:: Modified Independent Prior/Current Home Services: No current home services Social Drivers of Health Review: SDOH reviewed no interventions necessary Readmission risk has been reviewed: Yes Transition of care needs: transition of care needs identified, TOC will continue to follow
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
## 2024-03-24 ENCOUNTER — Ambulatory Visit (HOSPITAL_COMMUNITY): Admission: RE | Admit: 2024-03-24 | Source: Home / Self Care | Admitting: Vascular Surgery

## 2024-03-24 ENCOUNTER — Other Ambulatory Visit: Payer: Self-pay

## 2024-03-24 ENCOUNTER — Encounter (HOSPITAL_COMMUNITY)
Admission: EM | Disposition: A | Discharge status: Home / Self Care | Payer: Self-pay | Source: Home / Self Care | Attending: Internal Medicine

## 2024-03-24 DIAGNOSIS — M7989 Other specified soft tissue disorders: Secondary | ICD-10-CM | POA: Diagnosis not present

## 2024-03-24 DIAGNOSIS — I70222 Atherosclerosis of native arteries of extremities with rest pain, left leg: Secondary | ICD-10-CM | POA: Diagnosis not present

## 2024-03-24 DIAGNOSIS — S91105A Unspecified open wound of left lesser toe(s) without damage to nail, initial encounter: Secondary | ICD-10-CM | POA: Diagnosis not present

## 2024-03-24 DIAGNOSIS — E11628 Type 2 diabetes mellitus with other skin complications: Secondary | ICD-10-CM | POA: Diagnosis not present

## 2024-03-24 DIAGNOSIS — I70292 Other atherosclerosis of native arteries of extremities, left leg: Secondary | ICD-10-CM | POA: Diagnosis not present

## 2024-03-24 DIAGNOSIS — M86172 Other acute osteomyelitis, left ankle and foot: Secondary | ICD-10-CM | POA: Diagnosis not present

## 2024-03-24 DIAGNOSIS — L089 Local infection of the skin and subcutaneous tissue, unspecified: Secondary | ICD-10-CM | POA: Diagnosis not present

## 2024-03-24 DIAGNOSIS — I739 Peripheral vascular disease, unspecified: Secondary | ICD-10-CM

## 2024-03-24 HISTORY — PX: ABDOMINAL AORTOGRAM: CATH118222

## 2024-03-24 HISTORY — PX: LOWER EXTREMITY INTERVENTION: CATH118252

## 2024-03-24 HISTORY — PX: LOWER EXTREMITY ANGIOGRAPHY: CATH118251

## 2024-03-24 LAB — CREATININE, SERUM
Creatinine, Ser: 0.84 mg/dL (ref 0.61–1.24)
GFR, Estimated: >60 mL/min

## 2024-03-24 LAB — GLUCOSE, CAPILLARY
Glucose-Capillary: 137 mg/dL — ABNORMAL HIGH (ref 70–99)
Glucose-Capillary: 87 mg/dL (ref 70–99)
Glucose-Capillary: 208 mg/dL — ABNORMAL HIGH (ref 70–99)
Glucose-Capillary: 139 mg/dL — ABNORMAL HIGH (ref 70–99)

## 2024-03-24 LAB — CBC
HCT: 42.4 % (ref 39.0–52.0)
Hemoglobin: 13.6 g/dL (ref 13.0–17.0)
MCH: 31 pg (ref 26.0–34.0)
MCHC: 32.1 g/dL (ref 30.0–36.0)
MCV: 96.6 fL (ref 80.0–100.0)
Platelets: 298 K/uL (ref 150–400)
RBC: 4.39 MIL/uL (ref 4.22–5.81)
RDW: 12.1 % (ref 11.5–15.5)
WBC: 7.2 K/uL (ref 4.0–10.5)
nRBC: 0 % (ref 0.0–0.2)

## 2024-03-24 SURGERY — ABDOMINAL AORTOGRAM
Anesthesia: LOCAL

## 2024-03-24 MED ORDER — FENTANYL CITRATE (PF) 100 MCG/2ML IJ SOLN
INTRAMUSCULAR | Status: DC | PRN
  Administered 2024-03-24: 50 ug via INTRAVENOUS

## 2024-03-24 MED ORDER — HEPARIN SODIUM (PORCINE) 1000 UNIT/ML IJ SOLN
INTRAMUSCULAR | Status: AC
  Filled 2024-03-24: qty 10

## 2024-03-24 MED ORDER — LABETALOL HCL 5 MG/ML IV SOLN: 10.0000 mg | INTRAVENOUS | Status: DC | PRN

## 2024-03-24 MED ORDER — MIDAZOLAM HCL 2 MG/2ML IJ SOLN
INTRAMUSCULAR | Status: DC | PRN
  Administered 2024-03-24: 1 mg via INTRAVENOUS

## 2024-03-24 MED ORDER — SODIUM CHLORIDE 0.9% FLUSH: 3.0000 mL | INTRAVENOUS | Status: DC | PRN

## 2024-03-24 MED ORDER — SODIUM CHLORIDE 0.9 % IV SOLN: INTRAVENOUS | Status: DC

## 2024-03-24 MED ORDER — LIDOCAINE HCL (PF) 1 % IJ SOLN
INTRAMUSCULAR | Status: AC
  Filled 2024-03-24: qty 30

## 2024-03-24 MED ORDER — SODIUM CHLORIDE 0.9 % IV SOLN: 250.0000 mL | INTRAVENOUS | Status: AC | PRN

## 2024-03-24 MED ORDER — INSULIN GLARGINE-YFGN 100 UNIT/ML ~~LOC~~ SOLN
20.0000 [IU] | Freq: Every day | SUBCUTANEOUS | Status: DC
  Administered 2024-03-25 – 2024-03-26 (×2): 20 [IU] via SUBCUTANEOUS
  Filled 2024-03-24 (×2): qty 0.2

## 2024-03-24 MED ORDER — LIDOCAINE HCL (PF) 1 % IJ SOLN
INTRAMUSCULAR | Status: DC | PRN
  Administered 2024-03-24: 10 mL

## 2024-03-24 MED ORDER — CLOPIDOGREL BISULFATE 75 MG PO TABS
75.0000 mg | ORAL_TABLET | Freq: Every day | ORAL | Status: DC
  Administered 2024-03-26: 75 mg via ORAL
  Filled 2024-03-24: qty 1

## 2024-03-24 MED ORDER — SODIUM CHLORIDE 0.9% FLUSH
3.0000 mL | Freq: Two times a day (BID) | INTRAVENOUS | Status: DC
  Administered 2024-03-25 – 2024-03-26 (×4): 3 mL via INTRAVENOUS

## 2024-03-24 MED ORDER — CLOPIDOGREL BISULFATE 300 MG PO TABS
ORAL_TABLET | ORAL | Status: AC
  Filled 2024-03-24: qty 1

## 2024-03-24 MED ORDER — HYDRALAZINE HCL 20 MG/ML IJ SOLN: 5.0000 mg | INTRAMUSCULAR | Status: DC | PRN

## 2024-03-24 MED ORDER — SODIUM CHLORIDE 0.9 % WEIGHT BASED INFUSION
1.0000 mL/kg/h | INTRAVENOUS | Status: AC
  Administered 2024-03-24: 1 mL/kg/h via INTRAVENOUS

## 2024-03-24 MED ORDER — CLOPIDOGREL BISULFATE 300 MG PO TABS
ORAL_TABLET | ORAL | Status: DC | PRN
  Administered 2024-03-24: 300 mg via ORAL

## 2024-03-24 MED ORDER — HEPARIN SODIUM (PORCINE) 5000 UNIT/ML IJ SOLN
5000.0000 [IU] | Freq: Three times a day (TID) | INTRAMUSCULAR | Status: DC
  Administered 2024-03-24 – 2024-03-26 (×5): 5000 [IU] via SUBCUTANEOUS
  Filled 2024-03-24 (×5): qty 1

## 2024-03-24 MED ORDER — IODIXANOL 320 MG/ML IV SOLN
INTRAVENOUS | Status: DC | PRN
Start: 2024-03-24 — End: 2024-03-24
  Administered 2024-03-24: 130 mL

## 2024-03-24 MED ORDER — ACETAMINOPHEN 325 MG PO TABS
650.0000 mg | ORAL_TABLET | ORAL | Status: DC | PRN
Start: 2024-03-24 — End: 2024-03-26
  Administered 2024-03-24: 650 mg via ORAL
  Filled 2024-03-24: qty 2

## 2024-03-24 MED ORDER — HEPARIN SODIUM (PORCINE) 1000 UNIT/ML IJ SOLN
INTRAMUSCULAR | Status: DC | PRN
  Administered 2024-03-24: 8000 [IU] via INTRAVENOUS

## 2024-03-24 MED ORDER — MIDAZOLAM HCL 2 MG/2ML IJ SOLN
INTRAMUSCULAR | Status: AC
  Filled 2024-03-24: qty 2

## 2024-03-24 MED ORDER — HEPARIN (PORCINE) IN NACL 1000-0.9 UT/500ML-% IV SOLN
INTRAVENOUS | Status: DC | PRN
  Administered 2024-03-24 (×2): 500 mL

## 2024-03-24 MED ORDER — FENTANYL CITRATE (PF) 100 MCG/2ML IJ SOLN
INTRAMUSCULAR | Status: AC
Start: 2024-03-24 — End: 2024-03-24
  Filled 2024-03-24: qty 2

## 2024-03-24 SURGICAL SUPPLY — 18 items
BALLOON MUSTANG 4X40X135 (BALLOONS) IMPLANT
BALLOON STRLNG OTW 2.5X100X150 (BALLOONS) IMPLANT
CATH OMNI FLUSH 5F 65CM (CATHETERS) IMPLANT
CATH QUICKCROSS .018X135CM (MICROCATHETER) IMPLANT
CATH QUICKCROSS SUPP .035X90CM (MICROCATHETER) IMPLANT
DCB IN.PACT 5X40 (BALLOONS) IMPLANT
DEVICE CLOSURE MYNXGRIP 6/7F (Vascular Products) IMPLANT
GLIDEWIRE ADV .035X260CM (WIRE) IMPLANT
KIT ENCORE 26 ADVANTAGE (KITS) IMPLANT
KIT MICROPUNCTURE NIT STIFF (SHEATH) IMPLANT
KIT SINGLE USE MANIFOLD (KITS) IMPLANT
SET ATX-X65L (MISCELLANEOUS) IMPLANT
SHEATH CATAPULT 6FR 60 (SHEATH) IMPLANT
SHEATH PINNACLE 5F 10CM (SHEATH) IMPLANT
SHEATH PINNACLE 6F 10CM (SHEATH) IMPLANT
TRAY PV CATH (CUSTOM PROCEDURE TRAY) ×2 IMPLANT
WIRE BENTSON .035X145CM (WIRE) IMPLANT
WIRE G V18X300CM (WIRE) IMPLANT

## 2024-03-24 NOTE — Progress Notes (Signed)
 PROGRESS NOTE    Timothy Peters  FMW:[id number] DOB: [birth year] DOA: 03/22/2024 PCP: Ransom Other, MD   Brief Narrative:  73 y.o. male with medical history significant for insulin -dependent type 2 diabetes, hypertension, hyperlipidemia was admitted for diabetic wound of the left second toe after having completed antibiotic course as an outpatient.  He was started on broad-spectrum antibiotics.  Vascular surgery and podiatry were consulted.  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  Vascular surgery and podiatry following.  Vascular surgery planning for possible angiogram today.  Continue n.p.o. for today.  Diabetes mellitus type 2 - A1c 8.7.  Continue CBGs with SSI and long-acting insulin .  Hold metformin  while in the hospital  Hypertension Hyperlipidemia - Continue losartan , metoprolol  succinate, statin  History of hepatitis C treated in 2019 with underlying liver cirrhosis - Outpatient follow-up with PCP and/for GI   DVT prophylaxis: Lovenox  Code Status: Full Family Communication: None at bedside Disposition Plan: Status is: Inpatient Remains inpatient appropriate because: Of severity of illness  Consultants: Vascular surgery/podiatry  Procedures: None  Antimicrobials:  Anti-infectives (From admission, onward)    Start     Dose/Rate Route Frequency Ordered Stop   03/23/24 1400  cefTRIAXone  (ROCEPHIN ) 1 g in sodium chloride  0.9 % 100 mL IVPB        1 g 200 mL/hr over 30 Minutes Intravenous Every 24 hours 03/22/24 1555     03/23/24 0600  vancomycin  (VANCOREADY) IVPB 1750 mg/350 mL        1,750 mg 175 mL/hr over 120 Minutes Intravenous Daily 03/22/24 1610     03/22/24 1130  cefTRIAXone  (ROCEPHIN ) 2 g in sodium chloride  0.9 % 100 mL IVPB        2 g 200 mL/hr over 30 Minutes Intravenous  Once 03/22/24 1115 03/22/24 1400   03/22/24 1130  vancomycin   (VANCOREADY) IVPB 1750 mg/350 mL        1,750 mg 175 mL/hr over 120 Minutes Intravenous  Once 03/22/24 1120 03/22/24 1612        Subjective: Patient seen and examined at bedside.  Complains of left foot pain and swelling.  No fever, vomiting or abdominal pain reported.  Objective: Vitals:   03/23/24 1657 03/23/24 2031 03/24/24 0447 03/24/24 0742  BP: (!) 145/91 (!) 161/77 (!) 157/84 (!) 164/76  Pulse: (!) 57 (!) 51 65 (!) 57  Resp: 19 18 16 19   Temp: 98.2 F (36.8 C) 97.9 F (36.6 C) 98.2 F (36.8 C) 98.2 F (36.8 C)  TempSrc:  Oral Oral   SpO2: 99% 99% 97% 100%  Weight:      Height:        Intake/Output Summary (Last 24 hours) at 03/24/2024 0754 Last data filed at 03/24/2024 0447 Gross per 24 hour  Intake 1465.33 ml  Output 1500 ml  Net -34.67 ml   Filed Weights   03/22/24 1034  Weight: 87.5 kg    Examination:  General exam: Appears calm and comfortable.  Looks chronically ill and deconditioned Respiratory system: Bilateral decreased breath sounds at bases Cardiovascular system: S1 & S2 heard, mild intermittent bradycardia present gastrointestinal system: Abdomen is nondistended, soft and nontender. Normal bowel sounds heard. Extremities: No cyanosis, clubbing, edema  Central nervous system: Alert and oriented. No focal neurological deficits. Moving extremities Skin: Left second toe ulceration with swelling present Psychiatry: Flat affect.  Not agitated.    Data Reviewed: I have personally reviewed following labs  and imaging studies  CBC: Recent Labs  Lab 03/22/24 1100 03/23/24 0524  WBC 8.6 7.6  NEUTROABS 3.6  --   HGB 13.0 11.9*  HCT 41.1 35.1*  MCV 98.1 93.4  PLT 307 263   Basic Metabolic Panel: Recent Labs  Lab 03/22/24 1100 03/23/24 0917  NA 139 139  K 4.5 4.9  CL 103 107  CO2 26 23  GLUCOSE 177* 111*  BUN 22 16  CREATININE 1.12 0.91  CALCIUM  9.6 9.1   GFR: Estimated Creatinine Clearance: 79.3 mL/min (by C-G formula based on SCr of  0.91 mg/dL). Liver Function Tests: Recent Labs  Lab 03/22/24 1100  AST 13*  ALT 12  ALKPHOS 77  BILITOT 0.7  PROT 7.9  ALBUMIN 3.6   No results for input(s): LIPASE, AMYLASE in the last 168 hours. No results for input(s): AMMONIA in the last 168 hours. Coagulation Profile: Recent Labs  Lab 03/22/24 1100  INR 0.9   Cardiac Enzymes: No results for input(s): CKTOTAL, CKMB, CKMBINDEX, TROPONINI in the last 168 hours. BNP (last 3 results) No results for input(s): PROBNP in the last 8760 hours. HbA1C: Recent Labs    03/22/24 1559  HGBA1C 8.7*   CBG: Recent Labs  Lab 03/23/24 0824 03/23/24 1145 03/23/24 1740 03/23/24 2032 03/24/24 0744  GLUCAP 98 163* 107* 92 137*   Lipid Profile: No results for input(s): CHOL, HDL, LDLCALC, TRIG, CHOLHDL, LDLDIRECT in the last 72 hours. Thyroid  Function Tests: No results for input(s): TSH, T4TOTAL, FREET4, T3FREE, THYROIDAB in the last 72 hours. Anemia Panel: No results for input(s): VITAMINB12, FOLATE, FERRITIN, TIBC, IRON, RETICCTPCT in the last 72 hours. Sepsis Labs: Recent Labs  Lab 03/22/24 1104  LATICACIDVEN 1.7    Recent Results (from the past 240 hours)  Culture, blood (Routine x 2)     Status: None (Preliminary result)   Collection Time: 03/22/24 10:43 AM   Specimen: BLOOD  Result Value Ref Range Status   Specimen Description   Final    BLOOD BLOOD RIGHT ARM Performed at [facility], [address] [place], [place] [address]    Special Requests   Final    BOTTLES DRAWN AEROBIC AND ANAEROBIC Blood Culture adequate volume Performed at [facility], [address], [place], [place] [address]    Culture   Final    NO GROWTH 2 DAYS Performed at [facility], [address], [place], [place] [address]    Report Status PENDING  Incomplete  Culture, blood (Routine x 2)     Status: None (Preliminary result)   Collection  Time: 03/22/24 10:45 AM   Specimen: BLOOD  Result Value Ref Range Status   Specimen Description   Final    BLOOD BLOOD LEFT ARM Performed at [facility], [address], [place], [place] [address]    Special Requests   Final    BOTTLES DRAWN AEROBIC AND ANAEROBIC Blood Culture results may not be optimal due to an inadequate volume of blood received in culture bottles Performed at [facility], [address] [place], [place] [address]    Culture   Final    NO GROWTH 2 DAYS Performed at [facility], [address] [place], [place] [address]    Report Status PENDING  Incomplete  Surgical PCR screen     Status: None   Collection Time: 03/23/24  8:47 AM   Specimen: Nasal Mucosa; Nasal Swab  Result Value Ref Range  Status   MRSA, PCR NEGATIVE NEGATIVE Final   Staphylococcus aureus NEGATIVE NEGATIVE Final    Comment: (NOTE) The Xpert SA Assay (FDA approved for NASAL specimens in patients 18 years of age and older), is one component of a comprehensive surveillance program. It is not intended to diagnose infection nor to guide or monitor treatment. Performed at [facility], [address], [place], [place] [address]          Radiology Studies: CT ANGIO LOWER EXT BILAT W &/OR WO CONTRAST Result Date: 03/22/2024 EXAM: CTA ABDOMEN AND PELVIS WITHOUT AND WITH CONTRAST AND RUNOFF CTA OF THE LOWER EXTREMITIES WITH CONTRAST 03/22/2024 01:06:01 PM TECHNIQUE: CTA images of the abdomen, pelvis and lower extremities without and with intravenous contrast. Three-dimensional MIP/volume rendered formations were performed. Automated exposure control, iterative reconstruction, and/or weight based adjustment of the mA/kV was utilized to reduce the radiation dose to as low as reasonably achievable. COMPARISON: 12/11/2020 CLINICAL HISTORY: Claudication or leg ischemia. c/o left foot pain. Patient is a diabetic and had surgery to L foot 2 months ago. He  had top of toe amputated and is being followed by ortho. He c/o swelling and discoloration to L foot. FINDINGS: VASCULATURE: AORTA: Mild calcified plaque in the infrarenal abdominal aorta. No abdominal aortic aneurysm. No dissection. CELIAC TRUNK: No acute finding. No occlusion or significant stenosis. SUPERIOR MESENTERIC ARTERY: No acute finding. No occlusion or significant stenosis. INFERIOR MESENTERIC ARTERY: No acute finding. No occlusion or significant stenosis. RENAL ARTERIES: Single left renal artery, patent. Duplicated right renal arteries, superior dominant, both patent. No occlusion or significant stenosis. RIGHT ILIAC ARTERIES: Mild plaque in the common and external iliac arteries without aneurysmal disease. No occlusion or significant stenosis. RIGHT FEMORAL SRTERIES: Mild plaque in the common femoral artery. Scattered non-occlusive plaque in the distal sfa. No occlusion or significant stenosis. RIGHT POPLITEAL ARTERY: Scattered plaque in the proximal to mid popliteal artery with only mild short-segment stenosis. RIGHT CALF ARTERIES: Limited evaluation of tibial runoff. LEFT ILIAC ARTERIES: Moderate plaque in the common iliac in proximal external iliac without stenosis. No occlusion or significant stenosis. LEFT FEMORAL ARTERIES: Eccentric non-occlusive plaque in the common femoral artery. Mild scattered non-occlusive plaque in the mid and distal sfa trefication incompletely characterized due to scan timing. No occlusion or significant stenosis. LEFT POPLITEAL ARTERY: No acute finding. No occlusion or significant stenosis. LEFT CALF ARTERIES: No acute finding. No occlusion or significant stenosis. ABDOMEN AND PELVIS: LOWER CHEST: Dependent atelectasis posteriorly in the lung bases. LIVER: The liver is unremarkable. GALLBLADDER AND BILE DUCTS: A few subcentimeter partially calcified stones are layered in the dependent aspect of the nondilated gallbladder. No biliary ductal dilatation. SPLEEN: The spleen  is unremarkable. PANCREAS: The pancreas is unremarkable. ADRENAL GLANDS: Bilateral adrenal glands demonstrate no acute abnormality. KIDNEYS, URETERS AND BLADDER: No stones in the kidneys or ureters. No hydronephrosis. No evidence of perinephric or periureteral stranding. Urinary bladder is unremarkable. GI AND Bowel: Stomach and duodenal sweep demonstrate no acute abnormality. There is no bowel obstruction. No abnormal bowel wall thickening or distension. Normal appendix. REPRODUCTIVE: Mild prostate enlargement. PERITONEUM AND RETRPERITONEUM: No ascites or free air. Small umbilical hernia containing only mesenteric fat. LYMPH NODES: No evidence of lymphadenopathy. BONES AND SOFT TISSUES: Instrumented fusion L3-L5. No acute abnormality of the bones. No acute soft tissue abnormality. IMPRESSION: 1. No occlusion or hemodynamically significant stenosis of the arterial system of the abdomen, pelvis, or bilateral lower extremities. Limited evaluation of tibial runoff, however. 2. Mild calcified  plaque in the infrarenal abdominal aorta. 3. Small umbilical hernia Electronically signed by: Katheleen Faes MD 03/22/2024 03:03 PM EDT RP Workstation: [id number]   DG Foot Complete Left Result Date: 03/22/2024 CLINICAL DATA:  Diabetic with left foot pain. Surgery to left foot 2 months ago. Possible second toe osteomyelitis. EXAM: LEFT FOOT - COMPLETE 3+ VIEW COMPARISON:  03/17/2024, 02/18/2024 and 01/07/2024 FINDINGS: Minimal degenerative change of the first MTP joint. Evidence of patient's previous amputation distal to the base of the second distal phalanx. Subtle air over the very superficial distal soft tissues of the second toe without significant change from 03/17/2024. No evidence of adjacent bone destruction to suggest osteomyelitis. Remainder of the exam is unchanged. IMPRESSION: 1. No acute findings. 2. 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 Subtle focus of air over the superficial distal  soft tissues of the second toe without significant change from 03/17/2024. No evidence of adjacent bone destruction to suggest osteomyelitis. Electronically Signed   By: Toribio Agreste M.D.   On: 03/22/2024 11:50        Scheduled Meds:  aspirin   81 mg Oral Daily   DULoxetine   60 mg Oral Daily   enoxaparin  (LOVENOX ) injection  40 mg Subcutaneous Q24H   gabapentin   300 mg Oral QID   insulin  aspart  0-15 Units Subcutaneous TID WC   insulin  aspart  0-5 Units Subcutaneous QHS   insulin  glargine-yfgn  28 Units Subcutaneous Daily   losartan   100 mg Oral Daily   metFORMIN   1,000 mg Oral BID WC   metoprolol  succinate  25 mg Oral Daily   simvastatin   20 mg Oral QPM   Continuous Infusions:  sodium chloride      cefTRIAXone  (ROCEPHIN )  IV Stopped (03/23/24 1613)   vancomycin  1,750 mg (03/24/24 0501)          Sophie Mao, MD [facility] 03/24/2024, 7:54 AM

## 2024-03-24 NOTE — Op Note (Signed)
 Patient name: Timothy Peters MRN: [id number] DOB: June 25, 1951 Sex: male  03/24/2024 Pre-operative Diagnosis: Left lower extremity critical limb ischemia with tissue loss at the second toe Post-operative diagnosis:  Same Surgeon:  Fonda FORBES Rim, MD Procedure Performed: 1.  Ultrasound-guided micropuncture access of the right common femoral artery in retrograde fashion 2.  Aortogram 3.  Second-order cannulation, left lower extremity angiogram 4.  Selective angiography of the superficial femoral artery 5.  Drug-coated balloon angioplasty superficial femoral artery 5 x 40 mm 6.  Balloon angioplasty the posterior tibial artery 2.5 x 100 mm 7.  Balloon angioplasty of the peroneal artery 2.5 x 100 mm 8.  Moderate sedation time 62 minutes, contrast volume 130 mL   Indications: Patient is a 73 year old male with history of nonhealing left toe wound and nonpalpable pulses in the foot.  He had severely depressed ABIs and was diagnosed with left lower extremity critical and ischemia with tissue loss.  After discussing the risk and benefits of left lower extremity angiogram and after to find improve distal perfusion for wound healing, the patient elected to proceed.  Findings:   Aortogram: No flow-limiting stenosis in the aortoiliac segments bilaterally On the left: Widely patent common femoral artery, profunda, superficial femoral artery widely patent except for focal, greater than 95% stenosis at the mid SFA.  Popliteal artery widely patent.  Diffuse atherosclerotic disease involving the tibial vessels with two-vessel runoff to the foot.  There is greater than 95% focal lesion in the posterior tibial artery, and focal 70% lesion in the peroneal artery.    Procedure:  The patient was identified in the holding area and taken to room 8.  The patient was then placed supine on the table and prepped and draped in the usual sterile fashion.  A time out was called.  Ultrasound was used to evaluate the  right common femoral artery.  It was patent .  A digital ultrasound image was acquired.  A micropuncture needle was used to access the right common femoral artery under ultrasound guidance.  An 018 wire was advanced without resistance and a micropuncture sheath was placed.  The 018 wire was removed and a benson wire was placed.  The micropuncture sheath was exchanged for a 5 french sheath.  An omniflush catheter was advanced over the wire to the level of L-1.  An abdominal angiogram was obtained.  Next, using the omniflush catheter and a benson wire, the aortic bifurcation was crossed and the catheter was placed into theleft external iliac artery and left runoff was obtained.  right runoff was performed via retrograde sheath injections.  See results above.  I elected to intervene on the superficial femoral artery, posterior tibial, and peroneal arteries.  The patient was heparinized and a 6 x 60 cm sheath was brought onto the field and parked in the mid superficial femoral artery.  Next, a 4 x 40 mm balloon was brought into the field and expanded across the SFA lesion.  I then moved to a 5 x 40 mm drug-coated balloon.  There was an intimal defect appreciated, however this was nonflow limiting, therefore I elected to leave this without moving to stenting.  Distally, the 0.035 wire was exchanged for a 0.018 wires.  This was driven across the posterior tibial artery lesion and a 2.5 x 100 mm balloon brought onto the field expanded for 3 minutes.  Follow-up angiography demonstrated excellent result with resolution of flow-limiting stenosis.  Next, the wire was redirected to the peroneal  artery and in similar fashion the peroneal artery was balloon angioplastied at the focal lesion.  Followed angiography demonstrated excellent result with resolution of flow-limiting stenosis.  The patient has two-vessel runoff to the foot via the peroneal and posterior tibial artery.  The plantar arteries fill in the foot. Patient  was closed with use of Mynx device.  Impression: Successful balloon angioplasty of the superficial femoral artery, posterior tibial artery, peroneal artery.  He has been maximally revascularized.   Fonda FORBES Rim MD [facility] [place] Office: [phone]

## 2024-03-24 NOTE — Plan of Care (Signed)
  Problem: Coping: Goal: Ability to adjust to condition or change in health will improve Outcome: Progressing   Problem: Nutritional: Goal: Maintenance of adequate nutrition will improve Outcome: Progressing Goal: Progress toward achieving an optimal weight will improve Outcome: Progressing   Problem: Skin Integrity: Goal: Risk for impaired skin integrity will decrease Outcome: Progressing   Problem: Tissue Perfusion: Goal: Adequacy of tissue perfusion will improve Outcome: Progressing   Problem: Clinical Measurements: Goal: Ability to maintain clinical measurements within normal limits will improve Outcome: Progressing Goal: Will remain free from infection Outcome: Progressing Goal: Diagnostic test results will improve Outcome: Progressing Goal: Respiratory complications will improve Outcome: Progressing Goal: Cardiovascular complication will be avoided Outcome: Progressing   Problem: Clinical Measurements: Goal: Cardiovascular complication will be avoided Outcome: Progressing   Problem: Clinical Measurements: Goal: Will remain free from infection Outcome: Progressing

## 2024-03-24 NOTE — Plan of Care (Signed)
  Problem: Education: Goal: Ability to describe self-care measures that may prevent or decrease complications (Diabetes Survival Skills Education) will improve Outcome: Progressing   Problem: Coping: Goal: Ability to adjust to condition or change in health will improve Outcome: Progressing   Problem: Fluid Volume: Goal: Ability to maintain a balanced intake and output will improve Outcome: Progressing   Problem: Metabolic: Goal: Ability to maintain appropriate glucose levels will improve Outcome: Progressing   Problem: Nutritional: Goal: Maintenance of adequate nutrition will improve Outcome: Progressing   Problem: Skin Integrity: Goal: Risk for impaired skin integrity will decrease Outcome: Progressing   

## 2024-03-24 NOTE — Progress Notes (Signed)
   PODIATRY PROGRESS NOTE Patient Name: Timothy Peters  DOB 1950-10-05 DOA 03/22/2024  Hospital Day: 3  Assessment:  73 y.o. 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   Hgb A1c: 8.7  XR left foot 03/22/2024: Attention directed to left second toe there is erosion of the distal aspect of the distal phalanx of the left second toe.  This was reported by the radiologist as prior surgery however he has not had surgery and therefore this represents osteomyelitis with complete erosion of the majority of the distal phalanx.  Plan:  -NPO p MN for OR tomorrow around 1200 for Left 2nd toe amputation, he agrees to proceed.  -Appreciate vascular surgery plan for angiogram left lower extremity today to  optimize for healing potential after amputation - Continue IV abx broad spectrum pending further culture data - Anticoagulation: Okay to continue per primary/vascular recommendations - Wound care: Betadine paint and gauze to the left second toe as needed - WB status: Weightbearing as tolerated to the left foot - Will continue to follow        Marolyn JULIANNA Honour, DPM [facility]    Subjective:  Discussed with patient plan for OR tomorrow around noon for toe amputation left 2nd and he is in agreement, aware of nothing to eat/drink after midnight tonight.   Objective:   Vitals:   03/24/24 0447 03/24/24 0742  BP: (!) 157/84 (!) 164/76  Pulse: 65 (!) 57  Resp: 16 19  Temp: 98.2 F (36.8 C) 98.2 F (36.8 C)  SpO2: 97% 100%       Latest Ref Rng & Units 03/23/2024    5:24 AM 03/22/2024   11:00 AM 09/15/2022    2:27 AM  CBC  WBC 4.0 - 10.5 K/uL 7.6  8.6  6.8   Hemoglobin 13.0 - 17.0 g/dL 88.0  86.9  89.6   Hematocrit 39.0 - 52.0 % 35.1  41.1  31.8   Platelets 150 - 400 K/uL 263  307  337        Latest Ref Rng & Units 03/23/2024    9:17 AM 03/22/2024    11:00 AM 09/15/2022    2:27 AM  BMP  Glucose 70 - 99 mg/dL 888  822  854   BUN 8 - 23 mg/dL 16  22  31    Creatinine 0.61 - 1.24 mg/dL 9.08  8.87  8.82   Sodium 135 - 145 mmol/L 139  139  137   Potassium 3.5 - 5.1 mmol/L 4.9  4.5  4.1   Chloride 98 - 111 mmol/L 107  103  106   CO2 22 - 32 mmol/L 23  26  22    Calcium  8.9 - 10.3 mg/dL 9.1  9.6  9.2     General: AAOx3, NAD  Lower Extremity Exam Left foot DP and PT pulses nonpalpable Darkening of the left second toe   Ulceration at the distal aspect of the left second toe no active drainage. Significant edema of the second toe extending into the forefoot   Sensation is diminished to light touch left forefoot      Radiology:  Results reviewed. See assessment for pertinent imaging results

## 2024-03-24 NOTE — Progress Notes (Signed)
 Pt received from Cathlab, V/S obtained, CCMD notified, CHG bath given, right groin is C/D/I, all needs met, call bell in reach.    03/24/24 1404  Vitals  Temp 97.9 F (36.6 C)  Temp Source Oral  BP (!) 172/72  MAP (mmHg) 101  BP Location Right Arm  BP Method Automatic  Patient Position (if appropriate) Lying  Pulse Rate (!) 54  Pulse Rate Source Monitor  ECG Heart Rate (!) 53  Resp 14  Level of Consciousness  Level of Consciousness Alert  MEWS COLOR  MEWS Score Color Green  Oxygen Therapy  SpO2 100 %  O2 Device Room Air  Pain Assessment  Pain Scale 0-10  Pain Score 2  Pain Type Surgical pain  Pain Location Groin  Pain Orientation Right  MEWS Score  MEWS Temp 0  MEWS Systolic 0  MEWS Pulse 0  MEWS RR 0  MEWS LOC 0  MEWS Score 0

## 2024-03-25 ENCOUNTER — Inpatient Hospital Stay (HOSPITAL_COMMUNITY): Admitting: Anesthesiology

## 2024-03-25 ENCOUNTER — Inpatient Hospital Stay (HOSPITAL_COMMUNITY)

## 2024-03-25 ENCOUNTER — Encounter (HOSPITAL_COMMUNITY)
Admission: EM | Disposition: A | Discharge status: Home / Self Care | Payer: Self-pay | Source: Home / Self Care | Attending: Internal Medicine

## 2024-03-25 ENCOUNTER — Other Ambulatory Visit: Payer: Self-pay

## 2024-03-25 ENCOUNTER — Encounter (HOSPITAL_COMMUNITY): Payer: Self-pay | Admitting: Vascular Surgery

## 2024-03-25 DIAGNOSIS — E11628 Type 2 diabetes mellitus with other skin complications: Secondary | ICD-10-CM | POA: Diagnosis not present

## 2024-03-25 DIAGNOSIS — I10 Essential (primary) hypertension: Secondary | ICD-10-CM

## 2024-03-25 DIAGNOSIS — Z9862 Peripheral vascular angioplasty status: Secondary | ICD-10-CM | POA: Diagnosis not present

## 2024-03-25 DIAGNOSIS — Z87891 Personal history of nicotine dependence: Secondary | ICD-10-CM

## 2024-03-25 DIAGNOSIS — L089 Local infection of the skin and subcutaneous tissue, unspecified: Secondary | ICD-10-CM | POA: Diagnosis not present

## 2024-03-25 DIAGNOSIS — M869 Osteomyelitis, unspecified: Secondary | ICD-10-CM | POA: Diagnosis not present

## 2024-03-25 DIAGNOSIS — M86172 Other acute osteomyelitis, left ankle and foot: Secondary | ICD-10-CM | POA: Diagnosis not present

## 2024-03-25 DIAGNOSIS — E1169 Type 2 diabetes mellitus with other specified complication: Secondary | ICD-10-CM

## 2024-03-25 HISTORY — PX: AMPUTATION TOE: SHX6595

## 2024-03-25 LAB — CBC WITH DIFFERENTIAL/PLATELET
Abs Immature Granulocytes: 0.02 K/uL (ref 0.00–0.07)
Basophils Absolute: 0 K/uL (ref 0.0–0.1)
Basophils Relative: 1 %
Eosinophils Absolute: 1.1 K/uL — ABNORMAL HIGH (ref 0.0–0.5)
Eosinophils Relative: 17 %
HCT: 34.6 % — ABNORMAL LOW (ref 39.0–52.0)
Hemoglobin: 11.4 g/dL — ABNORMAL LOW (ref 13.0–17.0)
Immature Granulocytes: 0 %
Lymphocytes Relative: 27 %
Lymphs Abs: 1.7 K/uL (ref 0.7–4.0)
MCH: 31.4 pg (ref 26.0–34.0)
MCHC: 32.9 g/dL (ref 30.0–36.0)
MCV: 95.3 fL (ref 80.0–100.0)
Monocytes Absolute: 0.6 K/uL (ref 0.1–1.0)
Monocytes Relative: 9 %
Neutro Abs: 3 K/uL (ref 1.7–7.7)
Neutrophils Relative %: 46 %
Platelets: 293 K/uL (ref 150–400)
RBC: 3.63 MIL/uL — ABNORMAL LOW (ref 4.22–5.81)
RDW: 11.9 % (ref 11.5–15.5)
WBC: 6.4 K/uL (ref 4.0–10.5)
nRBC: 0 % (ref 0.0–0.2)

## 2024-03-25 LAB — GLUCOSE, CAPILLARY
Glucose-Capillary: 121 mg/dL — ABNORMAL HIGH (ref 70–99)
Glucose-Capillary: 114 mg/dL — ABNORMAL HIGH (ref 70–99)
Glucose-Capillary: 119 mg/dL — ABNORMAL HIGH (ref 70–99)
Glucose-Capillary: 122 mg/dL — ABNORMAL HIGH (ref 70–99)
Glucose-Capillary: 186 mg/dL — ABNORMAL HIGH (ref 70–99)
Glucose-Capillary: 182 mg/dL — ABNORMAL HIGH (ref 70–99)

## 2024-03-25 LAB — BASIC METABOLIC PANEL WITH GFR
Anion gap: 8 (ref 5–15)
BUN: 16 mg/dL (ref 8–23)
CO2: 23 mmol/L (ref 22–32)
Calcium: 9.2 mg/dL (ref 8.9–10.3)
Chloride: 107 mmol/L (ref 98–111)
Creatinine, Ser: 1.18 mg/dL (ref 0.61–1.24)
GFR, Estimated: >60 mL/min (ref >60)
Glucose, Bld: 136 mg/dL — ABNORMAL HIGH (ref 70–99)
Potassium: 4 mmol/L (ref 3.5–5.1)
Sodium: 138 mmol/L (ref 135–145)

## 2024-03-25 LAB — MAGNESIUM: Magnesium: 1.7 mg/dL (ref 1.7–2.4)

## 2024-03-25 LAB — C-REACTIVE PROTEIN: CRP: 0.5 mg/dL (ref <1.0)

## 2024-03-25 LAB — LIPID PANEL
Cholesterol: 134 mg/dL (ref 0–200)
HDL: 36 mg/dL — ABNORMAL LOW (ref >40)
LDL Cholesterol: 74 mg/dL (ref 0–99)
Total CHOL/HDL Ratio: 3.7 ratio
Triglycerides: 122 mg/dL (ref <150)
VLDL: 24 mg/dL (ref 0–40)

## 2024-03-25 SURGERY — AMPUTATION, TOE
Anesthesia: Monitor Anesthesia Care | Site: Toe | Laterality: Left

## 2024-03-25 MED ORDER — AMOXICILLIN-POT CLAVULANATE 875-125 MG PO TABS
1.0000 | ORAL_TABLET | Freq: Two times a day (BID) | ORAL | Status: DC
  Administered 2024-03-25 – 2024-03-26 (×2): 1 via ORAL
  Filled 2024-03-25 (×3): qty 1

## 2024-03-25 MED ORDER — ATORVASTATIN CALCIUM 80 MG PO TABS
80.0000 mg | ORAL_TABLET | Freq: Every day | ORAL | Status: DC
  Administered 2024-03-25 – 2024-03-26 (×2): 80 mg via ORAL
  Filled 2024-03-25 (×2): qty 1

## 2024-03-25 MED ORDER — MIDAZOLAM HCL 2 MG/2ML IJ SOLN
INTRAMUSCULAR | Status: AC
  Filled 2024-03-25: qty 2

## 2024-03-25 MED ORDER — LIDOCAINE HCL 1 % IJ SOLN
INTRAMUSCULAR | Status: AC
  Filled 2024-03-25: qty 20

## 2024-03-25 MED ORDER — FENTANYL CITRATE (PF) 250 MCG/5ML IJ SOLN
INTRAMUSCULAR | Status: AC
  Filled 2024-03-25: qty 5

## 2024-03-25 MED ORDER — PROPOFOL 10 MG/ML IV BOLUS
INTRAVENOUS | Status: DC | PRN
  Administered 2024-03-25: 20 mg via INTRAVENOUS
  Administered 2024-03-25: 40 mg via INTRAVENOUS
  Administered 2024-03-25: 30 mg via INTRAVENOUS

## 2024-03-25 MED ORDER — LACTATED RINGERS IV SOLN: INTRAVENOUS | Status: DC

## 2024-03-25 MED ORDER — ACETAMINOPHEN 10 MG/ML IV SOLN
1000.0000 mg | Freq: Once | INTRAVENOUS | Status: DC | PRN
Start: 2024-03-25 — End: 2024-03-25

## 2024-03-25 MED ORDER — BUPIVACAINE HCL 0.5 % IJ SOLN
INTRAMUSCULAR | Status: DC | PRN
  Administered 2024-03-25: 10 mL

## 2024-03-25 MED ORDER — CHLORHEXIDINE GLUCONATE 0.12 % MT SOLN
OROMUCOSAL | Status: AC
Start: 2024-03-25 — End: 2024-03-25
  Administered 2024-03-25: 15 mL via OROMUCOSAL
  Filled 2024-03-25: qty 15

## 2024-03-25 MED ORDER — ONDANSETRON HCL 4 MG/2ML IJ SOLN: 4.0000 mg | Freq: Once | INTRAMUSCULAR | Status: DC | PRN

## 2024-03-25 MED ORDER — CHLORHEXIDINE GLUCONATE 0.12 % MT SOLN: 15.0000 mL | Freq: Once | OROMUCOSAL | Status: AC

## 2024-03-25 MED ORDER — PROPOFOL 500 MG/50ML IV EMUL
INTRAVENOUS | Status: DC | PRN
  Administered 2024-03-25: 50 ug/kg/min via INTRAVENOUS

## 2024-03-25 MED ORDER — AMISULPRIDE (ANTIEMETIC) 5 MG/2ML IV SOLN: 10.0000 mg | Freq: Once | INTRAVENOUS | Status: DC | PRN

## 2024-03-25 MED ORDER — FENTANYL CITRATE (PF) 100 MCG/2ML IJ SOLN: 25.0000 ug | INTRAMUSCULAR | Status: DC | PRN

## 2024-03-25 MED ORDER — BUPIVACAINE HCL (PF) 0.5 % IJ SOLN
INTRAMUSCULAR | Status: AC
  Filled 2024-03-25: qty 30

## 2024-03-25 MED ORDER — SODIUM CHLORIDE 0.9 % IR SOLN
Status: DC | PRN
Start: 2024-03-25 — End: 2024-03-25
  Administered 2024-03-25: 1000 mL

## 2024-03-25 MED ORDER — INSULIN ASPART 100 UNIT/ML IJ SOLN: 0.0000 [IU] | INTRAMUSCULAR | Status: DC | PRN

## 2024-03-25 MED ORDER — ORAL CARE MOUTH RINSE: 15.0000 mL | Freq: Once | OROMUCOSAL | Status: AC

## 2024-03-25 SURGICAL SUPPLY — 36 items
BLADE AVERAGE 25X9 (BLADE) IMPLANT
BLADE SURG 10 STRL SS (BLADE) ×1 IMPLANT
BLADE SURG 15 STRL LF DISP TIS (BLADE) ×1 IMPLANT
BNDG COHESIVE 3X5 TAN ST LF (GAUZE/BANDAGES/DRESSINGS) ×1 IMPLANT
BNDG COMPR ESMARK 4X3 LF (GAUZE/BANDAGES/DRESSINGS) ×1 IMPLANT
BNDG ELASTIC 3INX 5YD STR LF (GAUZE/BANDAGES/DRESSINGS) ×1 IMPLANT
BNDG ELASTIC 4INX 5YD STR LF (GAUZE/BANDAGES/DRESSINGS) IMPLANT
BNDG GAUZE DERMACEA FLUFF 4 (GAUZE/BANDAGES/DRESSINGS) IMPLANT
CHLORAPREP W/TINT 26 (MISCELLANEOUS) IMPLANT
DRSG ADAPTIC 3X8 NADH LF (GAUZE/BANDAGES/DRESSINGS) IMPLANT
DRSG XEROFORM 1X8 (GAUZE/BANDAGES/DRESSINGS) IMPLANT
ELECTRODE REM PT RTRN 9FT ADLT (ELECTROSURGICAL) ×1 IMPLANT
GAUZE PAD ABD 8X10 STRL (GAUZE/BANDAGES/DRESSINGS) IMPLANT
GAUZE SPONGE 2X2 STRL 8-PLY (GAUZE/BANDAGES/DRESSINGS) IMPLANT
GAUZE SPONGE 4X4 12PLY STRL (GAUZE/BANDAGES/DRESSINGS) ×1 IMPLANT
GAUZE STRETCH 2X75IN STRL (MISCELLANEOUS) ×1 IMPLANT
GAUZE XEROFORM 1X8 LF (GAUZE/BANDAGES/DRESSINGS) ×1 IMPLANT
GLOVE BIO SURGEON STRL SZ7.5 (GLOVE) ×1 IMPLANT
GLOVE BIOGEL PI IND STRL 7.5 (GLOVE) ×1 IMPLANT
GOWN STRL REUS W/ TWL LRG LVL3 (GOWN DISPOSABLE) ×2 IMPLANT
KIT BASIN OR (CUSTOM PROCEDURE TRAY) ×1 IMPLANT
NDL HYPO 25X1 1.5 SAFETY (NEEDLE) ×1 IMPLANT
NEEDLE HYPO 25X1 1.5 SAFETY (NEEDLE) ×1 IMPLANT
PACK ORTHO EXTREMITY (CUSTOM PROCEDURE TRAY) ×1 IMPLANT
PADDING CAST ABS COTTON 4X4 ST (CAST SUPPLIES) ×2 IMPLANT
SET HNDPC FAN SPRY TIP SCT (DISPOSABLE) IMPLANT
SPIKE FLUID TRANSFER (MISCELLANEOUS) IMPLANT
STOCKINETTE 4X48 STRL (DRAPES) IMPLANT
SUT ETHILON 3 0 FSLX (SUTURE) IMPLANT
SUT PROLENE 3 0 PS 2 (SUTURE) IMPLANT
SUT PROLENE 4 0 PS 2 18 (SUTURE) IMPLANT
SYR CONTROL 10ML LL (SYRINGE) ×1 IMPLANT
TUBE CONNECTING 12X1/4 (SUCTIONS) IMPLANT
UNDERPAD 30X36 HEAVY ABSORB (UNDERPADS AND DIAPERS) ×1 IMPLANT
WATER STERILE IRR 1000ML POUR (IV SOLUTION) ×1 IMPLANT
YANKAUER SUCT BULB TIP NO VENT (SUCTIONS) IMPLANT

## 2024-03-25 NOTE — Progress Notes (Signed)
 Orthopedic Tech Progress Note Patient Details:  Ilan B Dudek 1951/03/02 [id number]  PO shoe is at bedside.  Ortho Devices Type of Ortho Device: Postop shoe/boot Ortho Device/Splint Location: LLE Ortho Device/Splint Interventions: Ordered   Post Interventions Instructions Provided: Care of device, Adjustment of device  Shontell Prosser Ronal Brasil 03/25/2024, 4:23 PM

## 2024-03-25 NOTE — Progress Notes (Signed)
  Daily Progress Note  S/p: 1 Day Post-Op Left lower extremity SFA, PT, peroneal balloon angioplasty  Subjective: Doing well this morning, no complaints  Objective: Vitals:   03/25/24 0354 03/25/24 0745  BP: 134/75 133/77  Pulse: (!) 56 (!) 57  Resp: 20 16  Temp: 98.1 F (36.7 C) 98.2 F (36.8 C)  SpO2: 98% 99%    Physical Examination Nonlabored breathing Regular rate, regular rhythm Alert and oriented x 3 Palpable posterior tibial artery pulse in the left foot No hematoma in the right groin  ASSESSMENT/PLAN:  Patient doing well status post left lower extremity SFA, PT, peroneal balloon angioplasty.  He is maximally revascularized. Plan for toe amputation today with podiatry  Okay for discharge from vascular surgery perspective.  I have set up follow-up.   Fonda FORBES Rim MD MS [facility] [phone] 03/25/2024  8:13 AM

## 2024-03-25 NOTE — Progress Notes (Addendum)
 PROGRESS NOTE    Timothy Peters  FMW:[id number] DOB: 28-Sep-1950 DOA: 03/22/2024 PCP: Ransom Other, MD   Brief Narrative:  73 y.o. male with medical history significant for insulin -dependent type 2 diabetes, hypertension, hyperlipidemia was admitted for diabetic wound of the left second toe after having completed antibiotic course as an outpatient.  He was started on broad-spectrum antibiotics.  Vascular surgery and podiatry were consulted.  He underwent left lower extremity angiogram and balloon angioplasty of the posterior tibial artery and peroneal artery by vascular surgery on 03/24/2024.  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  Vascular surgery and podiatry following.   -underwent left lower extremity angiogram and balloon angioplasty of the posterior tibial artery and peroneal artery by vascular surgery on 03/24/2024.  -Podiatry planning for possible left second toe amputation today.  Diabetes mellitus type 2 - A1c 8.7.  Continue CBGs with SSI and long-acting insulin .  Metformin  on hold.  Hypertension Hyperlipidemia - Continue losartan , metoprolol  succinate, statin.  Blood pressure stable.  History of hepatitis C treated in 2019 with underlying liver cirrhosis - Outpatient follow-up with PCP and/for GI  Obesity ruled out   DVT prophylaxis: Lovenox  Code Status: Full Family Communication: None at bedside Disposition Plan: Status is: Inpatient Remains inpatient appropriate because: Of severity of illness  Consultants: Vascular surgery/podiatry  Procedures: As above  Antimicrobials:  Anti-infectives (From admission, onward)    Start     Dose/Rate Route Frequency Ordered Stop   03/23/24 1400  cefTRIAXone  (ROCEPHIN ) 1 g in sodium chloride  0.9 % 100 mL IVPB        1 g 200 mL/hr over 30 Minutes Intravenous Every 24 hours 03/22/24 1555     03/23/24 0600   vancomycin  (VANCOREADY) IVPB 1750 mg/350 mL        1,750 mg 175 mL/hr over 120 Minutes Intravenous Daily 03/22/24 1610     03/22/24 1130  cefTRIAXone  (ROCEPHIN ) 2 g in sodium chloride  0.9 % 100 mL IVPB        2 g 200 mL/hr over 30 Minutes Intravenous  Once 03/22/24 1115 03/22/24 1400   03/22/24 1130  vancomycin  (VANCOREADY) IVPB 1750 mg/350 mL        1,750 mg 175 mL/hr over 120 Minutes Intravenous  Once 03/22/24 1120 03/22/24 1612        Subjective: Patient seen and examined at bedside.  Continues to have intermittent left foot pain.  No fever, chest pain, shortness of breath reported.  Objective: Vitals:   03/24/24 1549 03/24/24 1957 03/24/24 2338 03/25/24 0354  BP: (!) 142/85 (!) 159/77 (!) 125/56 134/75  Pulse: 67 61 (!) 54 (!) 56  Resp: 17 20 20 20   Temp: 98 F (36.7 C) 98.1 F (36.7 C) 98.1 F (36.7 C) 98.1 F (36.7 C)  TempSrc: Oral Oral Oral Oral  SpO2: 100% 99% 92% 98%  Weight:      Height:        Intake/Output Summary (Last 24 hours) at 03/25/2024 0721 Last data filed at 03/25/2024 9365 Gross per 24 hour  Intake 1427.63 ml  Output 302 ml  Net 1125.63 ml   Filed Weights   03/22/24 1034  Weight: 87.5 kg    Examination:  General: On room air.  No distress ENT/neck: No thyromegaly.  JVD is not elevated  respiratory: Decreased breath sounds at bases bilaterally with some crackles; no wheezing  CVS: S1-S2 heard, rate controlled currently Abdominal: Soft,  nontender, slightly distended; no organomegaly, bowel sounds are heard Extremities: Trace lower extremity edema; no cyanosis  CNS: Awake and alert.  No focal neurologic deficit.  Moves extremities Lymph: No obvious lymphadenopathy Skin: Left second toe ulceration with swelling present  psych: Mostly flat affect.  Not agitated currently.   Musculoskeletal: No obvious other joint swelling/deformity     Data Reviewed: I have personally reviewed following labs and imaging studies  CBC: Recent Labs  Lab  03/22/24 1100 03/23/24 0524 03/24/24 1431 03/25/24 0328  WBC 8.6 7.6 7.2 6.4  NEUTROABS 3.6  --   --  3.0  HGB 13.0 11.9* 13.6 11.4*  HCT 41.1 35.1* 42.4 34.6*  MCV 98.1 93.4 96.6 95.3  PLT 307 263 298 293   Basic Metabolic Panel: Recent Labs  Lab 03/22/24 1100 03/23/24 0917 03/24/24 1431 03/25/24 0328  NA 139 139  --  138  K 4.5 4.9  --  4.0  CL 103 107  --  107  CO2 26 23  --  23  GLUCOSE 177* 111*  --  136*  BUN 22 16  --  16  CREATININE 1.12 0.91 0.84 1.18  CALCIUM  9.6 9.1  --  9.2  MG  --   --   --  1.7   GFR: Estimated Creatinine Clearance: 61.2 mL/min (by C-G formula based on SCr of 1.18 mg/dL). Liver Function Tests: Recent Labs  Lab 03/22/24 1100  AST 13*  ALT 12  ALKPHOS 77  BILITOT 0.7  PROT 7.9  ALBUMIN 3.6   No results for input(s): LIPASE, AMYLASE in the last 168 hours. No results for input(s): AMMONIA in the last 168 hours. Coagulation Profile: Recent Labs  Lab 03/22/24 1100  INR 0.9   Cardiac Enzymes: No results for input(s): CKTOTAL, CKMB, CKMBINDEX, TROPONINI in the last 168 hours. BNP (last 3 results) No results for input(s): PROBNP in the last 8760 hours. HbA1C: Recent Labs    03/22/24 1559  HGBA1C 8.7*   CBG: Recent Labs  Lab 03/24/24 0744 03/24/24 1432 03/24/24 1549 03/24/24 2111 03/25/24 0621  GLUCAP 137* 87 208* 139* 121*   Lipid Profile: Recent Labs    03/25/24 0328  CHOL 134  HDL 36*  LDLCALC 74  TRIG 877  CHOLHDL 3.7   Thyroid  Function Tests: No results for input(s): TSH, T4TOTAL, FREET4, T3FREE, THYROIDAB in the last 72 hours. Anemia Panel: No results for input(s): VITAMINB12, FOLATE, FERRITIN, TIBC, IRON, RETICCTPCT in the last 72 hours. Sepsis Labs: Recent Labs  Lab 03/22/24 1104  LATICACIDVEN 1.7    Recent Results (from the past 240 hours)  Culture, blood (Routine x 2)     Status: None (Preliminary result)   Collection Time: 03/22/24 10:43 AM   Specimen:  BLOOD  Result Value Ref Range Status   Specimen Description   Final    BLOOD BLOOD RIGHT ARM Performed at [facility], [address] [place], [place] [address]    Special Requests   Final    BOTTLES DRAWN AEROBIC AND ANAEROBIC Blood Culture adequate volume Performed at [facility], [address], [place], [place] [address]    Culture   Final    NO GROWTH 2 DAYS Performed at [facility], [address], [place], [place] [address]    Report Status PENDING  Incomplete  Culture, blood (Routine x 2)     Status: None (Preliminary result)   Collection Time: 03/22/24 10:45 AM   Specimen: BLOOD  Result  Value Ref Range Status   Specimen Description   Final    BLOOD BLOOD LEFT ARM Performed at [facility], [address] [place], [place] [address]    Special Requests   Final    BOTTLES DRAWN AEROBIC AND ANAEROBIC Blood Culture results may not be optimal due to an inadequate volume of blood received in culture bottles Performed at [facility], [address] [place], [place] [address]    Culture   Final    NO GROWTH 2 DAYS Performed at [facility], [address], [place], [place] [address]    Report Status PENDING  Incomplete  Surgical PCR screen     Status: None   Collection Time: 03/23/24  8:47 AM   Specimen: Nasal Mucosa; Nasal Swab  Result Value Ref Range Status   MRSA, PCR NEGATIVE NEGATIVE Final   Staphylococcus aureus NEGATIVE NEGATIVE Final    Comment: (NOTE) The Xpert SA Assay (FDA approved for NASAL specimens in patients 1 years of age and older), is one component of a comprehensive surveillance program. It is not intended to diagnose infection nor to guide or monitor treatment. Performed at [facility], [address], [place], [place] [address]          Radiology Studies: PERIPHERAL VASCULAR CATHETERIZATION Result Date: 03/24/2024 Patient name: Timothy Peters MRN: [id number] DOB: 28-May-1951 Sex: male 03/24/2024 Pre-operative Diagnosis: Left lower extremity critical limb ischemia with tissue loss at the second toe Post-operative diagnosis:  Same Surgeon:  Fonda FORBES Rim, MD Procedure Performed: 1.  Ultrasound-guided micropuncture access of the right common femoral artery in retrograde fashion 2.  Aortogram 3.  Second-order cannulation, left lower extremity angiogram 4.  Selective angiography of the superficial femoral artery 5.  Drug-coated balloon angioplasty superficial femoral artery 5 x 40 mm 6.  Balloon angioplasty the posterior tibial artery 2.5 x 100 mm 7.  Balloon angioplasty of the peroneal artery 2.5 x 100 mm 8.  Moderate sedation time 62 minutes, contrast volume 130 mL Indications: Patient is a 73 year old male with history of nonhealing left toe wound and nonpalpable pulses in the foot.  He had severely depressed ABIs and was diagnosed with left lower extremity critical and ischemia with tissue loss.  After discussing the risk and benefits of left lower extremity angiogram and after to find improve distal perfusion for wound healing, the patient elected to proceed. Findings: Aortogram: No flow-limiting stenosis in the aortoiliac segments bilaterally On the left: Widely patent common femoral artery, profunda, superficial femoral artery widely patent except for focal, greater than 95% stenosis at the mid SFA.  Popliteal artery widely patent.  Diffuse atherosclerotic disease involving the tibial vessels with two-vessel runoff to the foot.  There is greater than 95% focal lesion in the posterior tibial artery, and focal 70% lesion in the peroneal artery.  Procedure:  The patient was identified in the holding area and taken to room 8.  The patient was then placed supine on the table and prepped and draped in the usual sterile fashion.  A time out was called.  Ultrasound was used to evaluate the right common femoral artery.  It was patent .  A digital  ultrasound image was acquired.  A micropuncture needle was used to access the right common femoral artery under ultrasound guidance.  An 018 wire was advanced without resistance and a micropuncture sheath was placed.  The 018 wire was removed and a benson wire was placed.  The micropuncture sheath was exchanged for a 5 french sheath.  An omniflush catheter was advanced over the wire to the level of L-1.  An abdominal angiogram was obtained.  Next, using the omniflush catheter and a benson wire, the aortic bifurcation was crossed and the catheter was placed into theleft external iliac artery and left runoff was obtained.  right runoff was performed via retrograde sheath injections. See results above. I elected to intervene on the superficial femoral artery, posterior tibial, and peroneal arteries.  The patient was heparinized and a 6 x 60 cm sheath was brought onto the field and parked in the mid superficial femoral artery.  Next, a 4 x 40 mm balloon was brought into the field and expanded across the SFA lesion.  I then moved to a 5 x 40 mm drug-coated balloon.  There was an intimal defect appreciated, however this was nonflow limiting, therefore I elected to leave this without moving to stenting.  Distally, the 0.035 wire was exchanged for a 0.018 wires.  This was driven across the posterior tibial artery lesion and a 2.5 x 100 mm balloon brought onto the field expanded for 3 minutes.  Follow-up angiography demonstrated excellent result with resolution of flow-limiting stenosis.  Next, the wire was redirected to the peroneal artery and in similar fashion the peroneal artery was balloon angioplastied at the focal lesion.  Followed angiography demonstrated excellent result with resolution of flow-limiting stenosis.  The patient has two-vessel runoff to the foot via the peroneal and posterior tibial artery.  The plantar arteries fill in the foot. Patient was closed with use of Mynx device. Impression: Successful  balloon angioplasty of the superficial femoral artery, posterior tibial artery, peroneal artery.  He has been maximally revascularized. Fonda FORBES Rim MD [facility] [place] Office: [phone]        Scheduled Meds:  aspirin   81 mg Oral Daily   clopidogrel   75 mg Oral Q breakfast   DULoxetine   60 mg Oral Daily   gabapentin   300 mg Oral QID   heparin   5,000 Units Subcutaneous Q8H   insulin  aspart  0-15 Units Subcutaneous TID WC   insulin  aspart  0-5 Units Subcutaneous QHS   insulin  glargine-yfgn  20 Units Subcutaneous Daily   losartan   100 mg Oral Daily   metoprolol  succinate  25 mg Oral Daily   simvastatin   20 mg Oral QPM   sodium chloride  flush  3 mL Intravenous Q12H   Continuous Infusions:  sodium chloride      cefTRIAXone  (ROCEPHIN )  IV Stopped (03/24/24 1627)   vancomycin  175 mL/hr at 03/25/24 9365          Sophie Mao, MD [facility] 03/25/2024, 7:21 AM

## 2024-03-25 NOTE — Anesthesia Procedure Notes (Signed)
 Procedure Name: MAC Date/Time: 03/25/2024 12:40 PM  Performed by: Worth Peppers, CRNAPre-anesthesia Checklist: Patient identified, Emergency Drugs available, Suction available and Patient being monitored Patient Re-evaluated:Patient Re-evaluated prior to induction Oxygen Delivery Method: Simple face mask Preoxygenation: Pre-oxygenation with 100% oxygen

## 2024-03-25 NOTE — Op Note (Signed)
 Full Operative Report  Date of Operation: 11:57 AM, 03/25/2024   Patient: Timothy Peters - 73 y.o. male  Surgeon: Malvin Marsa FALCON, DPM   Assistant: None  Diagnosis: Osteomyelitis Left 2nd toe  Procedure:  1. Amputation of left second toe through proximal phalanx head    Anesthesia: Monitor Anesthesia Care  No responsible provider has been recorded for the case.  No anesthesia staff entered.   Estimated Blood Loss: Minimal   Hemostasis: 1) Anatomical dissection, mechanical compression, electrocautery 2) No tourniquet was used during the procedure  Implants: * No implants in log *  Materials: Prolene 3-0   Injectables: 1) Pre-operatively: 10 cc of 50:50 mixture 1%lidocaine  plain and 0.5% marcaine  plain 2) Post-operatively: None   Specimens: - Pathology: L 2nd toe - Microbiology: none   Antibiotics: IV antibiotics given per schedule on the floor  Drains: None  Complications: Patient tolerated the procedure well without complication.   Operative findings: As below in detailed report  Indications for Procedure: Timothy Peters presents to [facility], [place] with a chief complaint of ulceration distal L 2nd toe with concern for underlying osteomyelitis.  The patient has failed conservative treatments of various modalities. At this time the patient has elected to proceed with surgical correction. All alternatives, risks, and complications of the procedures were thoroughly explained to the patient. Patient exhibits appropriate understanding of all discussion points and informed consent was signed and obtained in the chart with no guarantees to surgical outcome given or implied.  Description of Procedure: Patient was brought to the operating room. Patient remained on their hospital bed in the supine position. A surgical timeout was performed and all members of the operating room, the procedure, and the surgical site were identified. anesthesia occurred as  per anesthesia record. Local anesthetic as previously described was then injected about the operative field in a local infiltrative block.  The operative lower extremity as noted above was then prepped and draped in the usual sterile manner. The following procedure then began.  Attention was directed to the 2nd digit on the LEFT foot. A full-thickness incision encompassing the entire digit was made using a #15 blade. Dissection was carried down to bone. The toe was secured with a towel clamp, further dissected in its entirety, and disarticulated at the PIPJ and passed to the back table as a gross specimen. A bone cutting forceps was used to resect the head of the proximal phalanx for closure purposes. This was then labled and sent to pathology.  All remaining necrotic and devitalized soft tissue structures were visualized and dissected away using sharp and dull dissection. Care was taken to protect all neurovascular structures throughout the dissection. All bleeders were cauterized as necessary.  The area was then flushed with copious amounts of sterile saline. Then using the suture materials previously described, the site was closed in anatomic layers and the skin was well approximated under minimal tension.  The surgical site was then dressed with xeroform 4x4 kerlix and ace wrap. The patient tolerated both the procedure and anesthesia well with vital signs stable throughout. The patient was transferred in good condition and all vital signs stable  from the OR to recovery under the discretion of anesthesia.  Condition: Vital signs stable, neurovascular status unchanged from preoperative   Surgical plan:  Expect clean margin, recommend 5 days augmentin  from OR. WBAT in post op shoe. Expect stable for DC home tmrw.  The patient will be WBAT in a post op shoe to  the operative limb until further instructed. The dressing is to remain clean, dry, and intact. Will continue to follow unless noted elsewhere.    Marsa Honour, DPM [facility]

## 2024-03-25 NOTE — Progress Notes (Signed)
 History and Physical Interval Note:  03/25/2024 11:32 AM  Timothy Peters  has presented today for surgery, with the diagnosis of osteomyelitis of left second toe.  The various methods of treatment have been discussed with the patient and family. After consideration of risks, benefits and other options for treatment, the patient has consented to   Procedure(s) with comments: AMPUTATION, TOE (Left) - Amputation L 2nd toe partial or total as a surgical intervention.  The patient's history has been reviewed, patient examined, no change in status, stable for surgery.  I have reviewed the patient's chart and labs.  Questions were answered to the patient's satisfaction.     Marsa FALCON Sorrel Cassetta

## 2024-03-25 NOTE — Plan of Care (Signed)
  Problem: Education: Goal: Ability to describe self-care measures that may prevent or decrease complications (Diabetes Survival Skills Education) will improve Outcome: Progressing Goal: Individualized Educational Video(s) Outcome: Progressing   Problem: Coping: Goal: Ability to adjust to condition or change in health will improve Outcome: Progressing   Problem: Health Behavior/Discharge Planning: Goal: Ability to identify and utilize available resources and services will improve Outcome: Progressing Goal: Ability to manage health-related needs will improve Outcome: Progressing   Problem: Fluid Volume: Goal: Ability to maintain a balanced intake and output will improve Outcome: Progressing   Problem: Nutritional: Goal: Maintenance of adequate nutrition will improve Outcome: Progressing Goal: Progress toward achieving an optimal weight will improve Outcome: Progressing   Problem: Skin Integrity: Goal: Risk for impaired skin integrity will decrease Outcome: Progressing   Problem: Health Behavior/Discharge Planning: Goal: Ability to manage health-related needs will improve Outcome: Progressing   Problem: Clinical Measurements: Goal: Ability to maintain clinical measurements within normal limits will improve Outcome: Progressing Goal: Will remain free from infection Outcome: Progressing Goal: Diagnostic test results will improve Outcome: Progressing Goal: Respiratory complications will improve Outcome: Progressing Goal: Cardiovascular complication will be avoided Outcome: Progressing

## 2024-03-25 NOTE — Transfer of Care (Signed)
 Immediate Anesthesia Transfer of Care Note  Patient: Timothy Peters  Procedure(s) Performed: AMPUTATION, TOE (Left: Toe)  Patient Location: PACU  Anesthesia Type:MAC  Level of Consciousness: awake, alert , oriented, and patient cooperative  Airway & Oxygen Therapy: Patient Spontanous Breathing  Post-op Assessment: Report given to RN, Post -op Vital signs reviewed and stable, and Patient moving all extremities X 4  Post vital signs: Reviewed and stable  Last Vitals:  Vitals Value Taken Time  BP 156/71 03/25/24 13:07  Temp 37.1 C 03/25/24 12:51  Pulse 55 03/25/24 13:08  Resp 14 03/25/24 13:07  SpO2 96 % 03/25/24 13:08  Vitals shown include unfiled device data.  Last Pain:  Vitals:   03/25/24 1251  TempSrc:   PainSc: 0-No pain      Patients Stated Pain Goal: 0 (03/24/24 1549)  Complications: No notable events documented.

## 2024-03-25 NOTE — Anesthesia Postprocedure Evaluation (Signed)
 Anesthesia Post Note  Patient: Timothy Peters  Procedure(s) Performed: AMPUTATION, TOE (Left: Toe)     Patient location during evaluation: PACU Anesthesia Type: MAC Level of consciousness: awake Pain management: pain level controlled Vital Signs Assessment: post-procedure vital signs reviewed and stable Respiratory status: spontaneous breathing, nonlabored ventilation and respiratory function stable Cardiovascular status: blood pressure returned to baseline and stable Postop Assessment: no apparent nausea or vomiting Anesthetic complications: no   No notable events documented.  Last Vitals:  Vitals:   03/25/24 1307 03/25/24 1326  BP: (!) 156/71 (!) 160/93  Pulse:  60  Resp:  16  Temp:  36.6 C  SpO2:  99%    Last Pain:  Vitals:   03/25/24 1541  TempSrc:   PainSc: 8                  Dreonna Hussein P Faith Patricelli

## 2024-03-25 NOTE — Progress Notes (Signed)
 PHARMACIST LIPID MONITORING   Timothy Peters is a 73 y.o. male admitted on 03/22/2024 with left 2nd toe pain swelling now s/p angioplasty 8/6.  Pharmacy has been consulted to optimize lipid-lowering therapy with the indication of secondary prevention for clinical ASCVD.  Recent Labs:  Lipid Panel (last 6 months):   Lab Results  Component Value Date   CHOL 134 03/25/2024   TRIG 122 03/25/2024   HDL 36 (L) 03/25/2024   CHOLHDL 3.7 03/25/2024   VLDL 24 03/25/2024   LDLCALC 74 03/25/2024    Hepatic function panel (last 6 months):   Lab Results  Component Value Date   AST 13 (L) 03/22/2024   ALT 12 03/22/2024   ALKPHOS 77 03/22/2024   BILITOT 0.7 03/22/2024    SCr (since admission):   Serum creatinine: 1.18 mg/dL 91/92/74 9671 Estimated creatinine clearance: 61.2 mL/min  Current therapy and lipid therapy tolerance Current lipid-lowering therapy: Simvastatin  20mg  daily Previous lipid-lowering therapies (if applicable): none Documented or reported allergies or intolerances to lipid-lowering therapies (if applicable): none  Assessment:   Patient agrees with changes to lipid-lowering therapy  Plan:    1.Statin intensity (high intensity recommended for all patients regardless of the LDL):  Add or increase statin to high intensity.  Start Atorvastatin  80mg  daily.  2.Add ezetimibe (if any one of the following):   Not indicated at this time.  3.Refer to lipid clinic:   No  4.Follow-up with:  Primary care provider - Ransom Other, MD  5.Follow-up labs after discharge:  Changes in lipid therapy were made. Check a lipid panel in 8-12 weeks then annually.       Elanah Osmanovic, [place], PharmD 03/25/2024, 9:55 AM

## 2024-03-25 NOTE — Anesthesia Preprocedure Evaluation (Addendum)
 Anesthesia Evaluation  Patient identified by MRN, date of birth, ID band Patient awake    Reviewed: Allergy & Precautions, NPO status , Patient's Chart, lab work & pertinent test results  Airway Mallampati: II  TM Distance: >3 FB Neck ROM: Full    Dental  (+) Upper Dentures, Lower Dentures   Pulmonary sleep apnea and Continuous Positive Airway Pressure Ventilation , former smoker   Pulmonary exam normal        Cardiovascular hypertension, Pt. on medications and Pt. on home beta blockers Normal cardiovascular exam     Neuro/Psych  Neuromuscular disease    GI/Hepatic Neg liver ROS, hiatal hernia,,,  Endo/Other  diabetes, Oral Hypoglycemic Agents, Insulin  Dependent    Renal/GU Renal disease     Musculoskeletal   Abdominal   Peds  Hematology  (+) Blood dyscrasia, anemia   Anesthesia Other Findings Osteomyelitis Left 2nd toe  Reproductive/Obstetrics                              Anesthesia Physical Anesthesia Plan  ASA: 3  Anesthesia Plan: MAC   Post-op Pain Management:    Induction:   PONV Risk Score and Plan: 1 and Ondansetron , Dexamethasone , Propofol  infusion and Treatment may vary due to age or medical condition  Airway Management Planned: Simple Face Mask  Additional Equipment:   Intra-op Plan:   Post-operative Plan:   Informed Consent: I have reviewed the patients History and Physical, chart, labs and discussed the procedure including the risks, benefits and alternatives for the proposed anesthesia with the patient or authorized representative who has indicated his/her understanding and acceptance.     Dental advisory given  Plan Discussed with: CRNA  Anesthesia Plan Comments:         Anesthesia Quick Evaluation

## 2024-03-25 NOTE — Progress Notes (Signed)
 First attempt to obtain report from RN prior to surgery.

## 2024-03-26 ENCOUNTER — Encounter (HOSPITAL_COMMUNITY): Payer: Self-pay | Admitting: Podiatry

## 2024-03-26 ENCOUNTER — Other Ambulatory Visit (HOSPITAL_COMMUNITY): Payer: Self-pay

## 2024-03-26 DIAGNOSIS — L089 Local infection of the skin and subcutaneous tissue, unspecified: Secondary | ICD-10-CM | POA: Diagnosis not present

## 2024-03-26 DIAGNOSIS — E11628 Type 2 diabetes mellitus with other skin complications: Secondary | ICD-10-CM | POA: Diagnosis not present

## 2024-03-26 DIAGNOSIS — M86172 Other acute osteomyelitis, left ankle and foot: Secondary | ICD-10-CM | POA: Diagnosis not present

## 2024-03-26 LAB — GLUCOSE, CAPILLARY: Glucose-Capillary: 115 mg/dL — ABNORMAL HIGH (ref 70–99)

## 2024-03-26 MED ORDER — ATORVASTATIN CALCIUM 80 MG PO TABS
80.0000 mg | ORAL_TABLET | Freq: Every day | ORAL | 0 refills | Status: DC
  Filled 2024-03-26: qty 30, 30d supply, fill #0

## 2024-03-26 MED ORDER — CLOPIDOGREL BISULFATE 75 MG PO TABS
75.0000 mg | ORAL_TABLET | Freq: Every day | ORAL | 0 refills | Status: DC
  Filled 2024-03-26: qty 30, 30d supply, fill #0

## 2024-03-26 MED ORDER — OXYCODONE-ACETAMINOPHEN 5-325 MG PO TABS
1.0000 | ORAL_TABLET | ORAL | 0 refills | Status: DC | PRN
  Filled 2024-03-26: qty 20, 4d supply, fill #0

## 2024-03-26 MED ORDER — AMOXICILLIN-POT CLAVULANATE 875-125 MG PO TABS
1.0000 | ORAL_TABLET | Freq: Two times a day (BID) | ORAL | 0 refills | Status: AC
  Filled 2024-03-26: qty 10, 5d supply, fill #0

## 2024-03-26 NOTE — Plan of Care (Signed)

## 2024-03-26 NOTE — Progress Notes (Signed)
 Reviewed AVS, patient expressed understanding of medications, MD follow up reviewed.   Removed IV, Site clean, dry and intact.  See LDA for information on wounds at discharge. CCMD contacted and informed patients is being discharged.  Patient states all belongings brought to the hospital at time of admission are accounted for and packed to take home.  Patient informed and expressed understanding to pick up discharge medications at [facility] before leaving hospital.  Wife is on the way (5-10 mins away); Patient instructed to contact nurses station once wife arrives. RN informed and will contact vol. Transport when patients wife arrives.

## 2024-03-26 NOTE — Care Management Important Message (Signed)
 Important Message  Patient Details  Name: Timothy Peters MRN: [id number] Date of Birth: 1951-07-29   Important Message Given:  Yes - Medicare IM     Vonzell Arrie Sharps 03/26/2024, 12:24 PM

## 2024-03-26 NOTE — Plan of Care (Signed)
  Problem: Education: Goal: Ability to describe self-care measures that may prevent or decrease complications (Diabetes Survival Skills Education) will improve Outcome: Adequate for Discharge Goal: Individualized Educational Video(s) Outcome: Adequate for Discharge   Problem: Coping: Goal: Ability to adjust to condition or change in health will improve Outcome: Adequate for Discharge   Problem: Fluid Volume: Goal: Ability to maintain a balanced intake and output will improve Outcome: Adequate for Discharge   Problem: Health Behavior/Discharge Planning: Goal: Ability to identify and utilize available resources and services will improve Outcome: Adequate for Discharge Goal: Ability to manage health-related needs will improve Outcome: Adequate for Discharge   Problem: Metabolic: Goal: Ability to maintain appropriate glucose levels will improve Outcome: Adequate for Discharge   Problem: Nutritional: Goal: Maintenance of adequate nutrition will improve Outcome: Adequate for Discharge Goal: Progress toward achieving an optimal weight will improve Outcome: Adequate for Discharge   Problem: Tissue Perfusion: Goal: Adequacy of tissue perfusion will improve Outcome: Adequate for Discharge   Problem: Education: Goal: Knowledge of General Education information will improve Description: Including pain rating scale, medication(s)/side effects and non-pharmacologic comfort measures Outcome: Adequate for Discharge   Problem: Health Behavior/Discharge Planning: Goal: Ability to manage health-related needs will improve Outcome: Adequate for Discharge   Problem: Clinical Measurements: Goal: Ability to maintain clinical measurements within normal limits will improve Outcome: Adequate for Discharge Goal: Will remain free from infection Outcome: Adequate for Discharge Goal: Diagnostic test results will improve Outcome: Adequate for Discharge Goal: Respiratory complications will  improve Outcome: Adequate for Discharge Goal: Cardiovascular complication will be avoided Outcome: Adequate for Discharge   Problem: Activity: Goal: Risk for activity intolerance will decrease Outcome: Adequate for Discharge   Problem: Nutrition: Goal: Adequate nutrition will be maintained Outcome: Adequate for Discharge   Problem: Coping: Goal: Level of anxiety will decrease Outcome: Adequate for Discharge   Problem: Elimination: Goal: Will not experience complications related to bowel motility Outcome: Adequate for Discharge Goal: Will not experience complications related to urinary retention Outcome: Adequate for Discharge   Problem: Pain Managment: Goal: General experience of comfort will improve and/or be controlled Outcome: Adequate for Discharge   Problem: Safety: Goal: Ability to remain free from injury will improve Outcome: Adequate for Discharge   Problem: Skin Integrity: Goal: Risk for impaired skin integrity will decrease Outcome: Adequate for Discharge   Problem: Education: Goal: Understanding of CV disease, CV risk reduction, and recovery process will improve Outcome: Adequate for Discharge Goal: Individualized Educational Video(s) Outcome: Adequate for Discharge   Problem: Activity: Goal: Ability to return to baseline activity level will improve Outcome: Adequate for Discharge   Problem: Cardiovascular: Goal: Ability to achieve and maintain adequate cardiovascular perfusion will improve Outcome: Adequate for Discharge Goal: Vascular access site(s) Level 0-1 will be maintained Outcome: Adequate for Discharge   Problem: Health Behavior/Discharge Planning: Goal: Ability to safely manage health-related needs after discharge will improve Outcome: Adequate for Discharge

## 2024-03-26 NOTE — Discharge Summary (Signed)
 Physician Discharge Summary  Timothy Peters FMW:[id number] DOB: 1951-08-13 DOA: 03/22/2024  PCP: Ransom Other, MD  Admit date: 03/22/2024 Discharge date: 03/26/2024  Admitted From: Home Disposition: Home  Recommendations for Outpatient Follow-up:  Follow up with PCP in 1 week with repeat CBC/BMP Outpatient follow-up with podiatry and vascular surgery.  Wound care as per podiatry recommendations.  Discharge antibiotic and pain management as per podiatry recommendations. Follow up in ED if symptoms worsen or new appear   Home Health: No Equipment/Devices: None  Discharge Condition: Stable CODE STATUS: Full Diet recommendation: Heart healthy/carb modified  Brief/Interim Summary: 73 y.o. male with medical history significant for insulin -dependent type 2 diabetes, hypertension, hyperlipidemia was admitted for diabetic wound of the left second toe after having completed antibiotic course as an outpatient.  He was started on broad-spectrum antibiotics.  Vascular surgery and podiatry were consulted.  He underwent left lower extremity angiogram and balloon angioplasty of the posterior tibial artery and peroneal artery by vascular surgery on 03/24/2024.  He underwent amputation of left second toe through proximal phalanx head on 03/25/2024 by podiatry.  Subsequently, he has remained stable.  Podiatry has cleared him for discharge.  He will be discharged home today with close outpatient follow-up with PCP/podiatry/vascular surgery.  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   -underwent left lower extremity angiogram and balloon angioplasty of the posterior tibial artery and peroneal artery by vascular surgery on 03/24/2024.  Patient has been put on high intensity statin and Plavix  by vascular surgery.  These will be continued on discharge.  Vascular surgery has signed off and recommended  outpatient follow-up. -He underwent amputation of left second toe through proximal phalanx head on 03/25/2024 by podiatry.  Subsequently, he has remained stable.  Podiatry has cleared him for discharge.  He will be discharged home today with close outpatient follow-up with PCP/podiatry/vascular surgery. -Wound care as per podiatry recommendations.  Discharge antibiotic and pain management as per podiatry recommendations.   Diabetes mellitus type 2 - A1c 8.7.  Continue carb modified diet and home regimen.  Outpatient follow-up with PCP.   Hypertension Hyperlipidemia - Continue losartan , metoprolol  succinate, statin.  Statin plan as above.  Outpatient follow-up.  Blood pressure stable.   History of hepatitis C treated in 2019 with underlying liver cirrhosis - Outpatient follow-up with PCP and/for GI   Obesity ruled out   Discharge Instructions  Discharge Instructions     Diet - low sodium heart healthy   Complete by: As directed    Diet Carb Modified   Complete by: As directed    Discharge wound care:   Complete by: As directed    Wound care as per podiatry recommendations   Increase activity slowly   Complete by: As directed       Allergies as of 03/26/2024   No Known Allergies      Medication List     STOP taking these medications    HYDROcodone -acetaminophen  10-325 MG tablet Commonly known as: NORCO   simvastatin  20 MG tablet Commonly known as: ZOCOR        TAKE these medications    Accu-Chek Guide w/Device Kit Use as directed up to 4 times daily   Accu-Chek Softclix Lancets lancets Test up to 4 times a day as directed   amoxicillin -clavulanate 875-125 MG tablet Commonly known as: AUGMENTIN  Take 1 tablet by mouth 2 (two) times daily for 5 days.   aspirin   81 MG tablet Take 1 tablet (81 mg total) by mouth daily. Resume 4 days post-op What changed: additional instructions   atorvastatin  80 MG tablet Commonly known as: LIPITOR  Take 1 tablet (80 mg total)  by mouth daily. Start taking on: March 27, 2024   B-D UF III MINI PEN NEEDLES 31G X 5 MM Misc Generic drug: Insulin  Pen Needle Use with lantus  daily.   BD Pen Needle Micro U/F 32G X 6 MM Misc Generic drug: Insulin  Pen Needle Use as directed   clopidogrel  75 MG tablet Commonly known as: PLAVIX  Take 1 tablet (75 mg total) by mouth daily with breakfast. Start taking on: March 27, 2024   Dexcom G7 Sensor Misc [place]  as directed to continuously monitor blood glucose, replacing sensor every 10 days   docusate sodium  100 MG capsule Commonly known as: COLACE Take 1 capsule (100 mg total) by mouth 2 (two) times daily.   doxycycline  100 MG tablet Commonly known as: VIBRA -TABS Take 1 tablet (100 mg total) by mouth 2 (two) times daily.   DULoxetine  60 MG capsule Commonly known as: CYMBALTA  Take 1 capsule (60 mg total) by mouth daily.   Farxiga  10 MG Tabs tablet Generic drug: dapagliflozin  propanediol Take 1 tablet (10 mg total) by mouth daily.   gabapentin  300 MG capsule Commonly known as: NEURONTIN  Take 1 capsule (300 mg total) by mouth 4 (four) times daily.   glipiZIDE  5 MG tablet Commonly known as: GLUCOTROL  Take 1 tablet (5 mg total) by mouth daily if blood sugar is >150 mg/dL.   glucose blood test strip Use as instructed   Accu-Chek Guide test strip Generic drug: glucose blood Use to check blood sugar once daily.   IRON PO Take 1 tablet by mouth daily.   losartan  100 MG tablet Commonly known as: COZAAR  Take 1 tablet (100 mg total) by mouth daily.   metFORMIN  1000 MG tablet Commonly known as: GLUCOPHAGE  Take 1 tablet (1,000 mg total) by mouth 2 (two) times daily with a meal.   metoprolol  succinate 25 MG 24 hr tablet Commonly known as: TOPROL -XL Take 1 tablet (25 mg total) by mouth daily.   oxyCODONE -acetaminophen  5-325 MG tablet Commonly known as: Percocet Take 1 tablet by mouth every 4 (four) hours as needed for severe pain (pain score 7-10).   Tresiba   FlexTouch 100 UNIT/ML FlexTouch Pen Generic drug: insulin  degludec Inject 28 Units into the skin daily.               Discharge Care Instructions  (From admission, onward)           Start     Ordered   03/26/24 0000  Discharge wound care:       Comments: Wound care as per podiatry recommendations   03/26/24 0920            Follow-up Information     Ransom Other, MD. Schedule an appointment as soon as possible for a visit in 1 week(s).   Specialty: Internal Medicine Contact information: [address] [place] [address] [phone]         Lanis Fonda BRAVO, MD. Schedule an appointment as soon as possible for a visit in 1 week(s).   Specialty: Vascular Surgery Contact information: [address] [place] [address] [phone]                No Known Allergies  Consultations: Vascular surgery/podiatry   Procedures/Studies: DG Foot 2 Views Left Result Date: 03/25/2024 CLINICAL DATA:  Postop left second digit amputation EXAM: LEFT FOOT - 2 VIEW COMPARISON:  03/22/2024 FINDINGS: Frontal and lateral views of the left foot are obtained. Amputation of the second digit at the level of the distal margin of the second proximal phalanx. No acute or destructive bony abnormalities. Soft tissue swelling within the forefoot and second digit at the amputation site. IMPRESSION: 1. Second digit amputation as above.  No evidence of complication. Electronically Signed   By: Ozell Daring M.D.   On: 03/25/2024 18:12   PERIPHERAL VASCULAR CATHETERIZATION Result Date: 03/24/2024 Patient name: Jaishawn B Esquibel MRN: [id number] DOB: [birth year] Sex: male 03/24/2024 Pre-operative Diagnosis: Left lower extremity critical limb ischemia with tissue loss at the second toe Post-operative diagnosis:  Same Surgeon:  Fonda FORBES Rim, MD Procedure Performed: 1.  Ultrasound-guided micropuncture access of the right common femoral artery in retrograde fashion 2.   Aortogram 3.  Second-order cannulation, left lower extremity angiogram 4.  Selective angiography of the superficial femoral artery 5.  Drug-coated balloon angioplasty superficial femoral artery 5 x 40 mm 6.  Balloon angioplasty the posterior tibial artery 2.5 x 100 mm 7.  Balloon angioplasty of the peroneal artery 2.5 x 100 mm 8.  Moderate sedation time 62 minutes, contrast volume 130 mL Indications: Patient is a 73 year old male with history of nonhealing left toe wound and nonpalpable pulses in the foot.  He had severely depressed ABIs and was diagnosed with left lower extremity critical and ischemia with tissue loss.  After discussing the risk and benefits of left lower extremity angiogram and after to find improve distal perfusion for wound healing, the patient elected to proceed. Findings: Aortogram: No flow-limiting stenosis in the aortoiliac segments bilaterally On the left: Widely patent common femoral artery, profunda, superficial femoral artery widely patent except for focal, greater than 95% stenosis at the mid SFA.  Popliteal artery widely patent.  Diffuse atherosclerotic disease involving the tibial vessels with two-vessel runoff to the foot.  There is greater than 95% focal lesion in the posterior tibial artery, and focal 70% lesion in the peroneal artery.  Procedure:  The patient was identified in the holding area and taken to room 8.  The patient was then placed supine on the table and prepped and draped in the usual sterile fashion.  A time out was called.  Ultrasound was used to evaluate the right common femoral artery.  It was patent .  A digital ultrasound image was acquired.  A micropuncture needle was used to access the right common femoral artery under ultrasound guidance.  An 018 wire was advanced without resistance and a micropuncture sheath was placed.  The 018 wire was removed and a benson wire was placed.  The micropuncture sheath was exchanged for a 5 french sheath.  An omniflush  catheter was advanced over the wire to the level of L-1.  An abdominal angiogram was obtained.  Next, using the omniflush catheter and a benson wire, the aortic bifurcation was crossed and the catheter was placed into theleft external iliac artery and left runoff was obtained.  right runoff was performed via retrograde sheath injections. See results above. I elected to intervene on the superficial femoral artery, posterior tibial, and peroneal arteries.  The patient was heparinized and a 6 x 60 cm sheath was brought onto the field and parked in the mid superficial femoral artery.  Next, a 4 x 40 mm balloon was brought into the field and expanded across the SFA lesion.  I then moved to a  5 x 40 mm drug-coated balloon.  There was an intimal defect appreciated, however this was nonflow limiting, therefore I elected to leave this without moving to stenting.  Distally, the 0.035 wire was exchanged for a 0.018 wires.  This was driven across the posterior tibial artery lesion and a 2.5 x 100 mm balloon brought onto the field expanded for 3 minutes.  Follow-up angiography demonstrated excellent result with resolution of flow-limiting stenosis.  Next, the wire was redirected to the peroneal artery and in similar fashion the peroneal artery was balloon angioplastied at the focal lesion.  Followed angiography demonstrated excellent result with resolution of flow-limiting stenosis.  The patient has two-vessel runoff to the foot via the peroneal and posterior tibial artery.  The plantar arteries fill in the foot. Patient was closed with use of Mynx device. Impression: Successful balloon angioplasty of the superficial femoral artery, posterior tibial artery, peroneal artery.  He has been maximally revascularized. Fonda FORBES Rim MD [facility] Office: [phone]   CT ANGIO LOWER EXT BILAT W &/OR WO CONTRAST Result Date: 03/22/2024 EXAM: CTA ABDOMEN AND PELVIS WITHOUT AND WITH CONTRAST AND  RUNOFF CTA OF THE LOWER EXTREMITIES WITH CONTRAST 03/22/2024 01:06:01 PM TECHNIQUE: CTA images of the abdomen, pelvis and lower extremities without and with intravenous contrast. Three-dimensional MIP/volume rendered formations were performed. Automated exposure control, iterative reconstruction, and/or weight based adjustment of the mA/kV was utilized to reduce the radiation dose to as low as reasonably achievable. COMPARISON: 12/11/2020 CLINICAL HISTORY: Claudication or leg ischemia. c/o left foot pain. Patient is a diabetic and had surgery to L foot 2 months ago. He had top of toe amputated and is being followed by ortho. He c/o swelling and discoloration to L foot. FINDINGS: VASCULATURE: AORTA: Mild calcified plaque in the infrarenal abdominal aorta. No abdominal aortic aneurysm. No dissection. CELIAC TRUNK: No acute finding. No occlusion or significant stenosis. SUPERIOR MESENTERIC ARTERY: No acute finding. No occlusion or significant stenosis. INFERIOR MESENTERIC ARTERY: No acute finding. No occlusion or significant stenosis. RENAL ARTERIES: Single left renal artery, patent. Duplicated right renal arteries, superior dominant, both patent. No occlusion or significant stenosis. RIGHT ILIAC ARTERIES: Mild plaque in the common and external iliac arteries without aneurysmal disease. No occlusion or significant stenosis. RIGHT FEMORAL SRTERIES: Mild plaque in the common femoral artery. Scattered non-occlusive plaque in the distal sfa. No occlusion or significant stenosis. RIGHT POPLITEAL ARTERY: Scattered plaque in the proximal to mid popliteal artery with only mild short-segment stenosis. RIGHT CALF ARTERIES: Limited evaluation of tibial runoff. LEFT ILIAC ARTERIES: Moderate plaque in the common iliac in proximal external iliac without stenosis. No occlusion or significant stenosis. LEFT FEMORAL ARTERIES: Eccentric non-occlusive plaque in the common femoral artery. Mild scattered non-occlusive plaque in the mid and  distal sfa trefication incompletely characterized due to scan timing. No occlusion or significant stenosis. LEFT POPLITEAL ARTERY: No acute finding. No occlusion or significant stenosis. LEFT CALF ARTERIES: No acute finding. No occlusion or significant stenosis. ABDOMEN AND PELVIS: LOWER CHEST: Dependent atelectasis posteriorly in the lung bases. LIVER: The liver is unremarkable. GALLBLADDER AND BILE DUCTS: A few subcentimeter partially calcified stones are layered in the dependent aspect of the nondilated gallbladder. No biliary ductal dilatation. SPLEEN: The spleen is unremarkable. PANCREAS: The pancreas is unremarkable. ADRENAL GLANDS: Bilateral adrenal glands demonstrate no acute abnormality. KIDNEYS, URETERS AND BLADDER: No stones in the kidneys or ureters. No hydronephrosis. No evidence of perinephric or periureteral stranding. Urinary bladder is unremarkable. GI AND Bowel:  Stomach and duodenal sweep demonstrate no acute abnormality. There is no bowel obstruction. No abnormal bowel wall thickening or distension. Normal appendix. REPRODUCTIVE: Mild prostate enlargement. PERITONEUM AND RETRPERITONEUM: No ascites or free air. Small umbilical hernia containing only mesenteric fat. LYMPH NODES: No evidence of lymphadenopathy. BONES AND SOFT TISSUES: Instrumented fusion L3-L5. No acute abnormality of the bones. No acute soft tissue abnormality. IMPRESSION: 1. No occlusion or hemodynamically significant stenosis of the arterial system of the abdomen, pelvis, or bilateral lower extremities. Limited evaluation of tibial runoff, however. 2. Mild calcified plaque in the infrarenal abdominal aorta. 3. Small umbilical hernia Electronically signed by: Katheleen Faes MD 03/22/2024 03:03 PM EDT RP Workstation: [id number]   DG Foot Complete Left Result Date: 03/22/2024 CLINICAL DATA:  Diabetic with left foot pain. Surgery to left foot 2 months ago. Possible second toe osteomyelitis. EXAM: LEFT FOOT - COMPLETE 3+ VIEW  COMPARISON:  03/17/2024, 02/18/2024 and 01/07/2024 FINDINGS: Minimal degenerative change of the first MTP joint. Evidence of patient's previous amputation distal to the base of the second distal phalanx. Subtle air over the very superficial distal soft tissues of the second toe without significant change from 03/17/2024. No evidence of adjacent bone destruction to suggest osteomyelitis. Remainder of the exam is unchanged. IMPRESSION: 1. No acute findings. 2. 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 Subtle focus of air over the superficial distal soft tissues of the second toe without significant change from 03/17/2024. No evidence of adjacent bone destruction to suggest osteomyelitis. Electronically Signed   By: Toribio Agreste M.D.   On: 03/22/2024 11:50   DG Foot 2 Views Left Result Date: 03/17/2024 Please see detailed radiograph report in office note.     Subjective: Patient seen and examined at bedside.  Feels okay to go home today.  No fever, vomiting, abdominal pain reported.  Discharge Exam: Vitals:   03/26/24 0326 03/26/24 0724  BP: (!) 120/54 138/75  Pulse: 81 69  Resp: 20 12  Temp: 99.2 F (37.3 C) 98.2 F (36.8 C)  SpO2: 100% 93%    General: Pt is alert, awake, not in acute distress.  On room air. Cardiovascular: rate controlled, S1/S2 + Respiratory: bilateral decreased breath sounds at bases Abdominal: Soft, NT, ND, bowel sounds + Extremities: Left foot dressing present.  The results of significant diagnostics from this hospitalization (including imaging, microbiology, ancillary and laboratory) are listed below for reference.     Microbiology: Recent Results (from the past 240 hours)  Culture, blood (Routine x 2)     Status: None (Preliminary result)   Collection Time: 03/22/24 10:43 AM   Specimen: BLOOD  Result Value Ref Range Status   Specimen Description   Final    BLOOD BLOOD RIGHT ARM Performed at [facility], [address] [place], [place] [address]    Special Requests   Final    BOTTLES DRAWN AEROBIC AND ANAEROBIC Blood Culture adequate volume Performed at [facility], [address], [place], [place] [address]    Culture   Final    NO GROWTH 4 DAYS Performed at [facility], [address], [place], [place] [address]    Report Status PENDING  Incomplete  Culture, blood (Routine x 2)     Status: None (Preliminary result)   Collection Time: 03/22/24 10:45 AM   Specimen: BLOOD  Result Value Ref Range Status   Specimen Description   Final    BLOOD BLOOD LEFT ARM Performed at  [facility], [address] [place], [place] [address]    Special Requests   Final    BOTTLES DRAWN AEROBIC AND ANAEROBIC Blood Culture results may not be optimal due to an inadequate volume of blood received in culture bottles Performed at [facility], [address] [place], [place] [address]    Culture   Final    NO GROWTH 4 DAYS Performed at [facility], [address], [place], [place] [address]    Report Status PENDING  Incomplete  Surgical PCR screen     Status: None   Collection Time: 03/23/24  8:47 AM   Specimen: Nasal Mucosa; Nasal Swab  Result Value Ref Range Status   MRSA, PCR NEGATIVE NEGATIVE Final   Staphylococcus aureus NEGATIVE NEGATIVE Final    Comment: (NOTE) The Xpert SA Assay (FDA approved for NASAL specimens in patients 68 years of age and older), is one component of a comprehensive surveillance program. It is not intended to diagnose infection nor to guide or monitor treatment. Performed at [facility], [address], [place], [place] [address]      Labs: BNP (last 3 results) No results for input(s): BNP in the last 8760 hours. Basic Metabolic Panel: Recent Labs  Lab 03/22/24 1100 03/23/24 0917 03/24/24 1431 03/25/24 0328  NA 139 139  --  138  K 4.5 4.9  --  4.0  CL 103 107  --  107  CO2 26  23  --  23  GLUCOSE 177* 111*  --  136*  BUN 22 16  --  16  CREATININE 1.12 0.91 0.84 1.18  CALCIUM  9.6 9.1  --  9.2  MG  --   --   --  1.7   Liver Function Tests: Recent Labs  Lab 03/22/24 1100  AST 13*  ALT 12  ALKPHOS 77  BILITOT 0.7  PROT 7.9  ALBUMIN 3.6   No results for input(s): LIPASE, AMYLASE in the last 168 hours. No results for input(s): AMMONIA in the last 168 hours. CBC: Recent Labs  Lab 03/22/24 1100 03/23/24 0524 03/24/24 1431 03/25/24 0328  WBC 8.6 7.6 7.2 6.4  NEUTROABS 3.6  --   --  3.0  HGB 13.0 11.9* 13.6 11.4*  HCT 41.1 35.1* 42.4 34.6*  MCV 98.1 93.4 96.6 95.3  PLT 307 263 298 293   Cardiac Enzymes: No results for input(s): CKTOTAL, CKMB, CKMBINDEX, TROPONINI in the last 168 hours. BNP: Invalid input(s): POCBNP CBG: Recent Labs  Lab 03/25/24 1222 03/25/24 1250 03/25/24 1628 03/25/24 2125 03/26/24 0604  GLUCAP 119* 122* 186* 182* 115*   D-Dimer No results for input(s): DDIMER in the last 72 hours. Hgb A1c No results for input(s): HGBA1C in the last 72 hours. Lipid Profile Recent Labs    03/25/24 0328  CHOL 134  HDL 36*  LDLCALC 74  TRIG 877  CHOLHDL 3.7   Thyroid  function studies No results for input(s): TSH, T4TOTAL, T3FREE, THYROIDAB in the last 72 hours.  Invalid input(s): FREET3 Anemia work up No results for input(s): VITAMINB12, FOLATE, FERRITIN, TIBC, IRON, RETICCTPCT in the last 72 hours. Urinalysis    Component Value Date/Time   COLORURINE YELLOW 03/22/2024 1012   APPEARANCEUR CLEAR 03/22/2024 1012   LABSPEC 1.027 03/22/2024 1012   PHURINE 6.0 03/22/2024 1012   GLUCOSEU >=500 (A) 03/22/2024 1012   HGBUR NEGATIVE 03/22/2024 1012   BILIRUBINUR NEGATIVE 03/22/2024 1012   KETONESUR NEGATIVE 03/22/2024 1012   PROTEINUR  NEGATIVE 03/22/2024 1012   NITRITE NEGATIVE 03/22/2024 1012   LEUKOCYTESUR NEGATIVE 03/22/2024 1012   Sepsis Labs Recent Labs  Lab 03/22/24 1100  03/23/24 0524 03/24/24 1431 03/25/24 0328  WBC 8.6 7.6 7.2 6.4   Microbiology Recent Results (from the past 240 hours)  Culture, blood (Routine x 2)     Status: None (Preliminary result)   Collection Time: 03/22/24 10:43 AM   Specimen: BLOOD  Result Value Ref Range Status   Specimen Description   Final    BLOOD BLOOD RIGHT ARM Performed at [facility], [address] [place], [place] [address]    Special Requests   Final    BOTTLES DRAWN AEROBIC AND ANAEROBIC Blood Culture adequate volume Performed at [facility], [address], [place], [place] [address]    Culture   Final    NO GROWTH 4 DAYS Performed at [facility], [address], [place], [place] [address]    Report Status PENDING  Incomplete  Culture, blood (Routine x 2)     Status: None (Preliminary result)   Collection Time: 03/22/24 10:45 AM   Specimen: BLOOD  Result Value Ref Range Status   Specimen Description   Final    BLOOD BLOOD LEFT ARM Performed at [facility], [address], [place], [place] [address]    Special Requests   Final    BOTTLES DRAWN AEROBIC AND ANAEROBIC Blood Culture results may not be optimal due to an inadequate volume of blood received in culture bottles Performed at [facility], [address], [place], [place] [address]    Culture   Final    NO GROWTH 4 DAYS Performed at [facility], [address] [place], [place] [address]    Report Status PENDING  Incomplete  Surgical PCR screen     Status: None   Collection Time: 03/23/24  8:47 AM   Specimen: Nasal Mucosa; Nasal Swab  Result Value Ref Range Status   MRSA, PCR NEGATIVE NEGATIVE Final   Staphylococcus aureus NEGATIVE NEGATIVE Final    Comment: (NOTE) The Xpert SA Assay (FDA approved for NASAL specimens in patients 54 years of age and older), is one component of a comprehensive surveillance program. It is not intended to diagnose  infection nor to guide or monitor treatment. Performed at [facility], [address], [place], [place] [address]      Time coordinating discharge: 35 minutes  SIGNED:   Sophie Mao, MD  [facility] 03/26/2024, 9:21 AM

## 2024-03-26 NOTE — Progress Notes (Signed)
  Subjective:  Patient ID: Timothy Peters, male    DOB: [birth year],  MRN: [id number]  Chief Complaint  Patient presents with   Foot Injury    DOS: 03/25/24 Procedure: Left 2nd toe amputation through proximal phalanx head  73 y.o. male seen for post op check. He reports doing well denies pain. Aware of plans for dc and follow up. No concerns.   Review of Systems: Negative except as noted in the HPI. Denies N/V/F/Ch.   Objective:   Constitutional Well developed. Well nourished.  Vascular Foot warm and well perfused. Capillary refill normal to all digits.   No calf pain with palpation  Neurologic Normal speech. Oriented to person, place, and time. Epicritic sensation diminished to left forefoot  Dermatologic Dressing C/D/I to left foot  Orthopedic: S/p L 2nd toe amputation through proximal phalanx head   Radiographs: 1. Second digit amputation as above. No evidence of complication.   Pathology: pending  Micro: NA  Assessment:   1. Left foot infection   Osteomyelitis of left 2nd toe s/p amputation  Plan:  Patient was evaluated and treated and all questions answered.  POD # 1 s/p L 2nd toe amp proximal phalanx head level -Progressing as expected, pain controlled no concerns.  -XR: Expected post op changes -WB Status: WBAT in Post op shoe -Sutures: remain intact 2-3 weeks. -Medications/ABX: 5 days augmentin  and percocet on discharge, ordered -Dressing: Remain C/d/I until follow up next week - F/u Plan: F/u with me next week Thursday office to call. He is stable for DC home today. Leave dsg c/d/I. Will sign off.        Marolyn JULIANNA Honour, DPM [facility] / [facility]

## 2024-03-27 LAB — CULTURE, BLOOD (ROUTINE X 2)
Culture: NO GROWTH
Culture: NO GROWTH
Special Requests: ADEQUATE

## 2024-03-29 LAB — SURGICAL PATHOLOGY

## 2024-04-01 ENCOUNTER — Ambulatory Visit (INDEPENDENT_AMBULATORY_CARE_PROVIDER_SITE_OTHER): Admitting: Podiatry

## 2024-04-01 ENCOUNTER — Other Ambulatory Visit (HOSPITAL_COMMUNITY): Payer: Self-pay

## 2024-04-01 DIAGNOSIS — M86172 Other acute osteomyelitis, left ankle and foot: Secondary | ICD-10-CM | POA: Diagnosis not present

## 2024-04-01 DIAGNOSIS — Z9889 Other specified postprocedural states: Secondary | ICD-10-CM

## 2024-04-01 MED ORDER — OXYCODONE-ACETAMINOPHEN 10-325 MG PO TABS
1.0000 | ORAL_TABLET | ORAL | 0 refills | Status: DC | PRN
  Filled 2024-04-01: qty 20, 4d supply, fill #0

## 2024-04-01 NOTE — Progress Notes (Signed)
  Subjective:  Patient ID: Timothy Peters, male    DOB: 1951-03-19,  MRN: [id number]  Chief Complaint  Patient presents with   Post-op Follow-up    Left 2nd toe. Sutures intact, no dehiscence. Wearing surgical shoe.     DOS: 03/25/24 Procedure: Left 2nd toe amputation through proximal phalanx head  73 y.o. male seen for post op check.  He reports he is doing well denies pain. Wearing post op shoe.  Finished antibiotics  Review of Systems: Negative except as noted in the HPI. Denies N/V/F/Ch.   Objective:   Constitutional Well developed. Well nourished.  Vascular Foot warm and well perfused. Capillary refill normal to all digits.   No calf pain with palpation  Neurologic Normal speech. Oriented to person, place, and time. Epicritic sensation diminished to left forefoot  Dermatologic Amputation site to left second toe is well coapted sutures intact no dehiscence no drainage no maceration or necrosis   Orthopedic: S/p L 2nd toe amputation through proximal phalanx head   Radiographs: 1. Second digit amputation as above. No evidence of complication.   Pathology: pending  Micro: NA  Assessment:   1. Acute osteomyelitis of toe, left (HCC)   2. Post-operative state    Osteomyelitis of left 2nd toe s/p amputation  Plan:  Patient was evaluated and treated and all questions answered.  1 week s/p L 2nd toe amp proximal phalanx head level -Progressing as expected, amputation site healing well no evidence of dehiscence or necrosis no evidence of residual infection -XR: Expected post op changes -WB Status: WBAT in Post op shoe -Sutures: remain intact 1-2 more weeks -Medications/ABX: Status post course of Augmentin  monitor off antibiotics.  Refill of Percocet sent per patient request -Dressing: Remain C/d/I until follow up next week - F/u Plan: F/u in 1 week for possible suture removal        Marolyn JULIANNA Honour, DPM [facility] / [facility]

## 2024-04-02 DIAGNOSIS — E1165 Type 2 diabetes mellitus with hyperglycemia: Secondary | ICD-10-CM | POA: Diagnosis not present

## 2024-04-02 DIAGNOSIS — E11621 Type 2 diabetes mellitus with foot ulcer: Secondary | ICD-10-CM | POA: Diagnosis not present

## 2024-04-02 DIAGNOSIS — I739 Peripheral vascular disease, unspecified: Secondary | ICD-10-CM | POA: Diagnosis not present

## 2024-04-02 DIAGNOSIS — H9190 Unspecified hearing loss, unspecified ear: Secondary | ICD-10-CM | POA: Diagnosis not present

## 2024-04-02 DIAGNOSIS — S98132A Complete traumatic amputation of one left lesser toe, initial encounter: Secondary | ICD-10-CM | POA: Diagnosis not present

## 2024-04-08 ENCOUNTER — Other Ambulatory Visit (HOSPITAL_COMMUNITY): Payer: Self-pay

## 2024-04-08 ENCOUNTER — Ambulatory Visit (INDEPENDENT_AMBULATORY_CARE_PROVIDER_SITE_OTHER): Admitting: Podiatry

## 2024-04-08 ENCOUNTER — Encounter: Payer: Self-pay | Admitting: Podiatry

## 2024-04-08 DIAGNOSIS — Z89422 Acquired absence of other left toe(s): Secondary | ICD-10-CM | POA: Diagnosis not present

## 2024-04-08 DIAGNOSIS — M86172 Other acute osteomyelitis, left ankle and foot: Secondary | ICD-10-CM

## 2024-04-08 DIAGNOSIS — Z9889 Other specified postprocedural states: Secondary | ICD-10-CM

## 2024-04-08 MED ORDER — OXYCODONE-ACETAMINOPHEN 5-325 MG PO TABS
1.0000 | ORAL_TABLET | Freq: Four times a day (QID) | ORAL | 0 refills | Status: DC | PRN
  Filled 2024-04-08: qty 25, 7d supply, fill #0

## 2024-04-08 NOTE — Progress Notes (Signed)
  Subjective:  Patient ID: Timothy Peters, male    DOB: 1951/04/23,  MRN: [id number]  Chief Complaint  Patient presents with   Routine Post Op    Patient states that he has been having some throbbing pain in his 2nd toe left foot, the pain will shoot up his foot and it is hard to sleep at night sometimes.    DOS: 03/25/24 Procedure: Left 2nd toe amputation through proximal phalanx head  73 y.o. male seen for post op check.  He reports he is doing well.  He is wearing postop shoe.  Does report some pain to the amputation site.  Did have angio done on 8/6.  He has completed antibiotics.  Review of Systems: Negative except as noted in the HPI. Denies N/V/F/Ch.   Objective:   Constitutional Well developed. Well nourished.  Vascular Foot warm and well perfused. Capillary refill normal to all digits.   No calf pain with palpation  Neurologic Normal speech. Oriented to person, place, and time. Epicritic sensation diminished to left forefoot  Dermatologic Left second toe amputation site appears well-healed with skin edges well-approximated well coapted.  Sutures are intact.  No evidence of dehiscence or gapping.  No drainage noted.  Orthopedic: S/p L 2nd toe amputation through proximal phalanx head.  Site is tender on palpation.   Radiographs: 1. Second digit amputation as above. No evidence of complication.   Pathology: Positive for acute osteomyelitis, margin is clean without infection  Micro: NA  Assessment:   1. Acute osteomyelitis of toe, left (HCC)   2. Post-operative state    Osteomyelitis of left 2nd toe s/p amputation  Plan:  Patient was evaluated and treated and all questions answered.  Status post left second toe amputation 8/7 level of proximal phalanx -Progressing as expected, amputation site healing well no evidence of dehiscence or necrosis no evidence of residual infection -XR: Expected post op changes -WB Status: May begin to progress to weightbearing as  tolerated in regular shoes, gradual return -Sutures: Were removed today without incident -Medications/ABX: Has completed antibiotics with clean margins.  Will extend course of postop pain medication Percocet 5-325 mg every 6 hours as needed. - Return in 2 weeks to evaluate surgical site check and for diabetic footcare        Yatzil Clippinger L. Lamount, DPM [facility] / [facility]

## 2024-04-09 ENCOUNTER — Other Ambulatory Visit: Payer: Self-pay

## 2024-04-11 ENCOUNTER — Encounter: Payer: Self-pay | Admitting: Podiatry

## 2024-04-12 DIAGNOSIS — H43813 Vitreous degeneration, bilateral: Secondary | ICD-10-CM | POA: Diagnosis not present

## 2024-04-12 DIAGNOSIS — H35033 Hypertensive retinopathy, bilateral: Secondary | ICD-10-CM | POA: Diagnosis not present

## 2024-04-12 DIAGNOSIS — E113313 Type 2 diabetes mellitus with moderate nonproliferative diabetic retinopathy with macular edema, bilateral: Secondary | ICD-10-CM | POA: Diagnosis not present

## 2024-04-13 DIAGNOSIS — G4733 Obstructive sleep apnea (adult) (pediatric): Secondary | ICD-10-CM | POA: Diagnosis not present

## 2024-04-14 ENCOUNTER — Other Ambulatory Visit: Payer: Self-pay

## 2024-04-23 ENCOUNTER — Encounter: Admitting: Vascular Surgery

## 2024-04-23 ENCOUNTER — Encounter: Payer: Self-pay | Admitting: Podiatry

## 2024-04-23 ENCOUNTER — Other Ambulatory Visit (HOSPITAL_COMMUNITY): Payer: Self-pay

## 2024-04-23 ENCOUNTER — Ambulatory Visit (INDEPENDENT_AMBULATORY_CARE_PROVIDER_SITE_OTHER): Admitting: Podiatry

## 2024-04-23 VITALS — Ht 71.0 in | Wt 197.0 lb

## 2024-04-23 DIAGNOSIS — E1140 Type 2 diabetes mellitus with diabetic neuropathy, unspecified: Secondary | ICD-10-CM

## 2024-04-23 DIAGNOSIS — M79674 Pain in right toe(s): Secondary | ICD-10-CM

## 2024-04-23 DIAGNOSIS — E1149 Type 2 diabetes mellitus with other diabetic neurological complication: Secondary | ICD-10-CM

## 2024-04-23 DIAGNOSIS — M79675 Pain in left toe(s): Secondary | ICD-10-CM | POA: Diagnosis not present

## 2024-04-23 DIAGNOSIS — L84 Corns and callosities: Secondary | ICD-10-CM

## 2024-04-23 DIAGNOSIS — Z9889 Other specified postprocedural states: Secondary | ICD-10-CM

## 2024-04-23 DIAGNOSIS — B351 Tinea unguium: Secondary | ICD-10-CM

## 2024-04-23 DIAGNOSIS — I999 Unspecified disorder of circulatory system: Secondary | ICD-10-CM

## 2024-04-23 MED ORDER — TRAMADOL HCL 50 MG PO TABS
50.0000 mg | ORAL_TABLET | Freq: Two times a day (BID) | ORAL | 0 refills | Status: DC | PRN
  Filled 2024-04-23: qty 10, 5d supply, fill #0

## 2024-04-23 NOTE — Progress Notes (Signed)
 Subjective:  Patient ID: Timothy Peters, male    DOB: 1950/11/12,  MRN: [id number]  Chief Complaint  Patient presents with   Diabetes    Here for [facility] nail trim. Nails thickened Bilateral calluses Great to medial DOS: 03/25/24 Procedure: Left 2nd toe amputation through proximal phalanx head       DOS: 03/25/24 Procedure: Left 2nd toe amputation through proximal phalanx head  73 y.o. male seen for post op check.  He reports he is doing well.  He is wearing postop shoe.  Does report some surgical site pain especially at night which makes it difficult for him to sleep.  Did have angio done on 8/6.  He has completed antibiotics.  He does come in with painful, thickened, elongated, dystrophic toenails that are painful with direct pressure in shoe gear.  Also has painful calluses bilateral first toes.  Last A1c 8.7.  Review of Systems: Negative except as noted in the HPI. Denies N/V/F/Ch.   Objective:   Constitutional Well developed. Well nourished.  Vascular Foot warm and well perfused. Capillary refill normal to all digits.   No calf pain with palpation  Neurologic Normal speech. Oriented to person, place, and time. Epicritic sensation diminished to left forefoot.  Protective sensation decreased.  Dermatologic Left second toe amputation site appears well-healed with skin edges well-approximated well coapted.  No evidence of dehiscence or gapping.  Nailplates x 9 bilaterally are thickened, elongated, dystrophic with yellow discoloration subungual debris.  Preulcerative callus present bilateral first toe plantar medial aspect head of proximal phalanx.  Orthopedic: S/p L 2nd toe amputation through proximal phalanx head.  Site is tender on palpation.   Radiographs: 1. Second digit amputation as above. No evidence of complication.   Pathology: Positive for acute osteomyelitis, margin is clean without infection  Micro: NA  Assessment:   1. Corns and callosities   2. Post-operative state    3. Pain due to onychomycosis of toenails of both feet   4. Diabetic neuropathy with neurologic complication (HCC)   5. Vascular disease    Osteomyelitis of left 2nd toe s/p amputation  Plan:  Patient was evaluated and treated and all questions answered.  Status post left second toe amputation 8/7 level of proximal phalanx -Progressing as expected, amputation site healing well no evidence of dehiscence or necrosis no evidence of residual infection -Does complain of pain keeping him up at night.  Short course of oral tramadol  sent to patient's pharmacy.  50 mg every 12 hours as needed.  Did advise patient that we will not be able to provide further narcotics going forward outside of acute postoperative course. - Weightbearing as tolerated in regular shoe gear - Seems to be doing very well without significant pain.  Continue to monitor incision site  # Painful callus bilateral first toe plantar medial head of proximal phalanx -All symptomatic hyperkeratoses x2 were safely debrided with a sterile #312 blade to patient's level of comfort without incident. We discussed preventative and palliative care of these lesions including supportive and accommodative shoegear, padding, prefabricated and custom molded accommodative orthoses, use of a pumice stone and lotions/creams daily. - At risk Q7 care  #Onychomycosis with pain  -Nails palliatively debrided as below. -Educated on self-care  Procedure: Nail Debridement Rationale: Pain Type of Debridement: manual, sharp debridement. Instrumentation: Nail nipper, rotary burr. Number of Nails: 9  # Diabetes with neuropathy and PAD Patient educated on diabetes. Discussed proper diabetic foot care and discussed risks and complications of disease. Educated patient  in depth on reasons to return to the office immediately should he/she discover anything concerning or new on the feet. All questions answered. Discussed proper shoes as well.             Caylee Vlachos L. Lamount, DPM [facility] / [facility]

## 2024-04-26 ENCOUNTER — Encounter: Payer: Self-pay | Admitting: Podiatry

## 2024-04-26 DIAGNOSIS — E113311 Type 2 diabetes mellitus with moderate nonproliferative diabetic retinopathy with macular edema, right eye: Secondary | ICD-10-CM | POA: Diagnosis not present
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
## 2024-04-28 ENCOUNTER — Other Ambulatory Visit (HOSPITAL_COMMUNITY): Payer: Self-pay

## 2024-04-28 ENCOUNTER — Other Ambulatory Visit: Payer: Self-pay

## 2024-04-28 ENCOUNTER — Encounter (HOSPITAL_COMMUNITY): Payer: Self-pay

## 2024-04-29 ENCOUNTER — Ambulatory Visit (HOSPITAL_BASED_OUTPATIENT_CLINIC_OR_DEPARTMENT_OTHER): Admit: 2024-04-29 | Discharge: 2024-04-29 | Discharge status: Home / Self Care | Attending: Surgery | Admitting: Surgery

## 2024-04-29 ENCOUNTER — Ambulatory Visit (INDEPENDENT_AMBULATORY_CARE_PROVIDER_SITE_OTHER): Admitting: Vascular Surgery

## 2024-04-29 ENCOUNTER — Encounter: Payer: Self-pay | Admitting: Vascular Surgery

## 2024-04-29 ENCOUNTER — Ambulatory Visit (HOSPITAL_COMMUNITY)
Admission: RE | Admit: 2024-04-29 | Discharge: 2024-04-29 | Disposition: A | Discharge status: Home / Self Care | Source: Ambulatory Visit | Attending: Surgery | Admitting: Surgery

## 2024-04-29 VITALS — BP 160/88 | HR 61 | Temp 97.9°F | Ht 71.0 in | Wt 194.0 lb

## 2024-04-29 DIAGNOSIS — I70245 Atherosclerosis of native arteries of left leg with ulceration of other part of foot: Secondary | ICD-10-CM | POA: Diagnosis not present

## 2024-04-29 DIAGNOSIS — I739 Peripheral vascular disease, unspecified: Secondary | ICD-10-CM

## 2024-04-29 LAB — VAS US ABI WITH/WO TBI
Left ABI: 1.01
Right ABI: 0.94

## 2024-04-30 ENCOUNTER — Other Ambulatory Visit: Payer: Self-pay

## 2024-04-30 DIAGNOSIS — I739 Peripheral vascular disease, unspecified: Secondary | ICD-10-CM

## 2024-05-04 ENCOUNTER — Ambulatory Visit: Attending: Internal Medicine | Admitting: Audiologist

## 2024-05-04 DIAGNOSIS — H903 Sensorineural hearing loss, bilateral: Secondary | ICD-10-CM | POA: Insufficient documentation

## 2024-05-04 NOTE — Procedures (Signed)
  Outpatient Audiology and [facility] [address] [place], [place]  [address] [phone]  AUDIOLOGICAL  EVALUATION  NAME: Timothy Peters     DOB:   [birth year]      MRN: [id number]                                                                                     DATE: 05/04/2024     REFERENT: Ransom Other, MD STATUS: Outpatient DIAGNOSIS: Sensorineural Hearing Loss Bilateral    History: [name] was seen for an audiological evaluation due to difficulty hearing. [name]'s wife accompanied him today. She feels he cannot hear. [name] feels his hearing is worse in the right ear but over all he hears well. He liked listening to music over bluetooth headphones.  [name] denies pain, pressure, or tinnitus.  [name] has history of hazardous occupational noise exposure.  Medical history shows diabetes risk for hearing loss.    Evaluation:  Otoscopy showed a clear view of the tympanic membranes, bilaterally Tympanometry results were consistent with normal middle ear function, bilaterally   Audiometric testing was completed using Conventional Audiometry techniques with insert earphones and supraural headphones. Test results are consistent with normal hearing sloping after 1.5kHz to moderately severe sensorineural hearing loss bilaterally. Speech Recognition Thresholds were obtained at  25dB HL in the right ear and at 25dB HL in the left ear. Word Recognition Testing was completed at  40dB SL and [name] scored 100% in the left ear and 96% in thel right ear.    Results:  The test results were reviewed with [name] and his wife. [name] has sloping sensorineural  hearing loss bilaterally. He needs hearing aids for both ears. He has normal middle ear function. Loss is likely from diabetes, age, and noise exposure. Audiogram printed and provided to [name].    Recommendations: Hearing aids recommended for both ears. Patient given list of local hearing aid providers. Given [facility]  information for aid coverage. Annual audiometric testing recommended to monitor hearing loss for progression.    29 minutes spent testing and counseling on results.   If you have any questions please feel free to contact me at [phone].  Lauraine Ka Stalnaker Au.D.  Audiologist   05/04/2024  1:27 PM  Cc: [name], [name], MD

## 2024-05-10 DIAGNOSIS — E113312 Type 2 diabetes mellitus with moderate nonproliferative diabetic retinopathy with macular edema, left eye: Secondary | ICD-10-CM | POA: Diagnosis not present

## 2024-05-11 ENCOUNTER — Other Ambulatory Visit (HOSPITAL_COMMUNITY): Payer: Self-pay

## 2024-05-11 ENCOUNTER — Other Ambulatory Visit: Payer: Self-pay

## 2024-05-11 MED ORDER — CLOPIDOGREL BISULFATE 75 MG PO TABS
75.0000 mg | ORAL_TABLET | Freq: Every day | ORAL | 3 refills | Status: AC
  Filled 2024-05-11 – 2024-08-08 (×2): qty 90, 90d supply, fill #0

## 2024-05-12 ENCOUNTER — Other Ambulatory Visit: Payer: Self-pay

## 2024-05-12 ENCOUNTER — Other Ambulatory Visit (HOSPITAL_COMMUNITY): Payer: Self-pay

## 2024-05-14 ENCOUNTER — Emergency Department (HOSPITAL_COMMUNITY)
Admission: EM | Admit: 2024-05-14 | Discharge: 2024-05-14 | Disposition: A | Discharge status: Home / Self Care | Attending: Emergency Medicine | Admitting: Emergency Medicine

## 2024-05-14 ENCOUNTER — Emergency Department (HOSPITAL_COMMUNITY)

## 2024-05-14 ENCOUNTER — Encounter (HOSPITAL_COMMUNITY): Payer: Self-pay

## 2024-05-14 ENCOUNTER — Other Ambulatory Visit: Payer: Self-pay

## 2024-05-14 DIAGNOSIS — Z7984 Long term (current) use of oral hypoglycemic drugs: Secondary | ICD-10-CM | POA: Diagnosis not present

## 2024-05-14 DIAGNOSIS — R55 Syncope and collapse: Secondary | ICD-10-CM | POA: Diagnosis not present

## 2024-05-14 DIAGNOSIS — Z794 Long term (current) use of insulin: Secondary | ICD-10-CM | POA: Insufficient documentation

## 2024-05-14 DIAGNOSIS — Z79899 Other long term (current) drug therapy: Secondary | ICD-10-CM | POA: Diagnosis not present

## 2024-05-14 DIAGNOSIS — Z7982 Long term (current) use of aspirin: Secondary | ICD-10-CM | POA: Diagnosis not present

## 2024-05-14 DIAGNOSIS — Z87891 Personal history of nicotine dependence: Secondary | ICD-10-CM | POA: Diagnosis not present

## 2024-05-14 DIAGNOSIS — E1140 Type 2 diabetes mellitus with diabetic neuropathy, unspecified: Secondary | ICD-10-CM | POA: Insufficient documentation

## 2024-05-14 DIAGNOSIS — I499 Cardiac arrhythmia, unspecified: Secondary | ICD-10-CM | POA: Diagnosis not present

## 2024-05-14 DIAGNOSIS — Z7901 Long term (current) use of anticoagulants: Secondary | ICD-10-CM | POA: Insufficient documentation

## 2024-05-14 DIAGNOSIS — E876 Hypokalemia: Secondary | ICD-10-CM | POA: Insufficient documentation

## 2024-05-14 DIAGNOSIS — M47812 Spondylosis without myelopathy or radiculopathy, cervical region: Secondary | ICD-10-CM | POA: Diagnosis not present

## 2024-05-14 DIAGNOSIS — R404 Transient alteration of awareness: Secondary | ICD-10-CM | POA: Diagnosis not present

## 2024-05-14 DIAGNOSIS — M5021 Other cervical disc displacement,  high cervical region: Secondary | ICD-10-CM | POA: Diagnosis not present

## 2024-05-14 DIAGNOSIS — Z743 Need for continuous supervision: Secondary | ICD-10-CM | POA: Diagnosis not present

## 2024-05-14 DIAGNOSIS — I10 Essential (primary) hypertension: Secondary | ICD-10-CM | POA: Diagnosis not present

## 2024-05-14 DIAGNOSIS — I959 Hypotension, unspecified: Secondary | ICD-10-CM | POA: Insufficient documentation

## 2024-05-14 DIAGNOSIS — I6523 Occlusion and stenosis of bilateral carotid arteries: Secondary | ICD-10-CM | POA: Diagnosis not present

## 2024-05-14 LAB — URINALYSIS, ROUTINE W REFLEX MICROSCOPIC
Bilirubin Urine: NEGATIVE
Glucose, UA: >=500 mg/dL — AB
Hgb urine dipstick: NEGATIVE
Ketones, ur: NEGATIVE mg/dL
Leukocytes,Ua: NEGATIVE
Nitrite: NEGATIVE
Protein, ur: NEGATIVE mg/dL
Specific Gravity, Urine: 1.029 (ref 1.005–1.030)
pH: 6 (ref 5.0–8.0)

## 2024-05-14 LAB — CBC
HCT: 30.4 % — ABNORMAL LOW (ref 39.0–52.0)
Hemoglobin: 9.7 g/dL — ABNORMAL LOW (ref 13.0–17.0)
MCH: 32.7 pg (ref 26.0–34.0)
MCHC: 31.9 g/dL (ref 30.0–36.0)
MCV: 102.4 fL — ABNORMAL HIGH (ref 80.0–100.0)
Platelets: 218 K/uL (ref 150–400)
RBC: 2.97 MIL/uL — ABNORMAL LOW (ref 4.22–5.81)
RDW: 13.5 % (ref 11.5–15.5)
WBC: 5.5 K/uL (ref 4.0–10.5)
nRBC: 0 % (ref 0.0–0.2)

## 2024-05-14 LAB — COMPREHENSIVE METABOLIC PANEL WITH GFR
ALT: 9 U/L (ref 0–44)
AST: 13 U/L — ABNORMAL LOW (ref 15–41)
Albumin: 2.7 g/dL — ABNORMAL LOW (ref 3.5–5.0)
Alkaline Phosphatase: 49 U/L (ref 38–126)
Anion gap: 8 (ref 5–15)
BUN: 13 mg/dL (ref 8–23)
CO2: 19 mmol/L — ABNORMAL LOW (ref 22–32)
Calcium: 7.2 mg/dL — ABNORMAL LOW (ref 8.9–10.3)
Chloride: 115 mmol/L — ABNORMAL HIGH (ref 98–111)
Creatinine, Ser: 1.06 mg/dL (ref 0.61–1.24)
GFR, Estimated: >60 mL/min (ref >60)
Glucose, Bld: 78 mg/dL (ref 70–99)
Potassium: 3.1 mmol/L — ABNORMAL LOW (ref 3.5–5.1)
Sodium: 142 mmol/L (ref 135–145)
Total Bilirubin: 0.6 mg/dL (ref 0.0–1.2)
Total Protein: 5.3 g/dL — ABNORMAL LOW (ref 6.5–8.1)

## 2024-05-14 LAB — CBG MONITORING, ED: Glucose-Capillary: 88 mg/dL (ref 70–99)

## 2024-05-14 LAB — TROPONIN I (HIGH SENSITIVITY): Troponin I (High Sensitivity): 6 ng/L (ref <18)

## 2024-05-14 MED ORDER — LACTATED RINGERS IV BOLUS: 1000.0000 mL | Freq: Once | INTRAVENOUS | Status: DC

## 2024-05-14 MED ORDER — SODIUM CHLORIDE 0.9 % IV BOLUS
500.0000 mL | Freq: Once | INTRAVENOUS | Status: AC
  Administered 2024-05-14: 500 mL via INTRAVENOUS

## 2024-05-14 MED ORDER — POTASSIUM CHLORIDE CRYS ER 20 MEQ PO TBCR
40.0000 meq | EXTENDED_RELEASE_TABLET | Freq: Once | ORAL | Status: AC
  Administered 2024-05-14: 40 meq via ORAL
  Filled 2024-05-14: qty 2

## 2024-05-14 MED ORDER — POTASSIUM CHLORIDE CRYS ER 20 MEQ PO TBCR
20.0000 meq | EXTENDED_RELEASE_TABLET | Freq: Every day | ORAL | 0 refills | Status: DC
  Filled 2024-05-14: qty 5, 5d supply, fill #0

## 2024-05-14 NOTE — ED Notes (Signed)
 Wife at bedside reported pt has been drinking & smoking marijuana today & is diabetic & only ate his first meal around 1400 which was pancakes with syrup before this took place.

## 2024-05-14 NOTE — Discharge Instructions (Addendum)
 Thank for letting us  evaluate you today.  Your labs were notable for mild low potassium.  We have given you IV fluids, potassium supplementation here in the Emergency Department.  Have also sent you home with a potassium supplement for the next few days.  Please take as prescribed.  Please follow-up with primary care doctor regarding blood pressure management.  Please try to ensure adequate hydration, slowly get up from sitting to standing positions.  Please return to Emergency Department for chest pain, shortness of breath, syncopal episode, headache, blurred vision, inability to walk, dizziness at rest, worsening symptoms

## 2024-05-14 NOTE — ED Provider Notes (Signed)
 [place] [facility] Provider Note   CSN: [id number] Arrival date & time: 05/14/24  1709     Patient presents with: Syncopal Episode and Hypotension   Timothy Peters is a 73 y.o. male with past medical history of HTN, HLD, insulin -dependent T2DM, peripheral neuropathy, polysubstance abuse, EtOH abuse, ischemic limb presents to Emergency Department via EMS for evaluation of witnessed syncopal episode by nephew at 1500.  He reports that he was sitting outside in his back porch walking during movies when he started feeling hot and got very sweaty.  Nephew reports that patient had a few seconds of LOC and urinated on himself while sitting in chair. No seizure like activity. Did not fall, no head injury.  Patient reports that he has not eaten today and has only had 2-3 10% cans of wine cooler.  Reports that he normally drinks 1-2 cans daily for the past year.  Also has not eaten today.  Is compliant with medications.  Currently, feels at baseline with no complaints.  Denies ever having chest pain, shortness of breath, new medications, NVD, cough, complaints prior to today.  EMS reports patient was diaphoretic upon their arrival with a initial BP 72/50 that improved to 124/70 following 1L NS   HPI     Prior to Admission medications   Medication Sig Start Date End Date Taking? Authorizing Provider  potassium chloride  SA (KLOR-CON  M) 20 MEQ tablet Take 1 tablet (20 mEq total) by mouth daily. 05/14/24  Yes Minnie Tinnie BRAVO, PA  Accu-Chek Softclix Lancets lancets Test up to 4 times a day as directed 07/09/22   Jillian Buttery, MD  aspirin  81 MG tablet Take 1 tablet (81 mg total) by mouth daily. Resume 4 days post-op Patient taking differently: Take 81 mg by mouth daily. 10/30/17   Bissell, Jaclyn M, PA-C  atorvastatin  (LIPITOR ) 80 MG tablet Take 1 tablet (80 mg total) by mouth daily. 03/27/24   Cheryle Page, MD  blood glucose meter kit and supplies Use as directed  up to 4 times daily 07/09/22   Jillian Buttery, MD  clopidogrel  (PLAVIX ) 75 MG tablet Take 1 tablet (75 mg total) by mouth daily with breakfast. 03/27/24   Cheryle Page, MD  clopidogrel  (PLAVIX ) 75 MG tablet Take 1 tablet (75 mg total) by mouth daily. 05/11/24     Continuous Glucose Sensor (DEXCOM G7 SENSOR) MISC [place]  as directed to continuously monitor blood glucose, replacing sensor every 10 days 06/30/23     dapagliflozin  propanediol (FARXIGA ) 10 MG TABS tablet Take 1 tablet (10 mg total) by mouth daily. 09/26/23     doxycycline  (VIBRA -TABS) 100 MG tablet Take 1 tablet (100 mg total) by mouth 2 (two) times daily. 03/17/24   Magdalen Pasco RAMAN, DPM  DULoxetine  (CYMBALTA ) 60 MG capsule Take 1 capsule (60 mg total) by mouth daily. 01/21/24     Ferrous Sulfate  (IRON PO) Take 1 tablet by mouth daily.    [provider]  gabapentin  (NEURONTIN ) 300 MG capsule Take 1 capsule (300 mg total) by mouth 4 (four) times daily. 02/05/22     glipiZIDE  (GLUCOTROL ) 5 MG tablet Take 1 tablet (5 mg total) by mouth daily if blood sugar is >150 mg/dL. 09/26/23     glucose blood test strip Use as instructed 12/29/15   Tobie Lockwood V, MD  glucose blood test strip Use to check blood sugar once daily. 08/01/22   [name], [name], MD  insulin  degludec (TRESIBA  FLEXTOUCH) 100 UNIT/ML FlexTouch Pen Inject  28 Units into the skin daily. 09/26/23     Insulin  Pen Needle (NOVOFINE PEN NEEDLE) 32G X 6 MM MISC Use as directed 12/23/22     Insulin  Pen Needle 31G X 5 MM MISC Use with lantus  daily. 08/06/21   [name], [name], MD  losartan  (COZAAR ) 100 MG tablet Take 1 tablet (100 mg total) by mouth daily. 09/26/23     metFORMIN  (GLUCOPHAGE ) 1000 MG tablet Take 1 tablet (1,000 mg total) by mouth 2 (two) times daily with a meal. 09/26/23     metoprolol  succinate (TOPROL -XL) 25 MG 24 hr tablet Take 1 tablet (25 mg total) by mouth daily. 09/26/23     traMADol  (ULTRAM ) 50 MG tablet Take 1 tablet (50 mg total) by mouth every 12 (twelve) hours as needed for  up to 10 doses for severe pain (pain score 7-10). 04/23/24   Lamount Ethan CROME, DPM    Allergies: Patient has no known allergies.    Review of Systems  Neurological:  Negative for seizures.    Updated Vital Signs BP (!) 173/83   Pulse (!) 58   Temp 98.6 F (37 C) (Temporal)   Resp 19   Ht 5' 11 (1.803 m)   Wt 87.5 kg   SpO2 99%   BMI 26.92 kg/m   Physical Exam Vitals and nursing note reviewed.  Constitutional:      General: He is not in acute distress.    Appearance: Normal appearance. He is not ill-appearing.  HENT:     Head: Normocephalic and atraumatic.     Mouth/Throat:     Mouth: Mucous membranes are moist.  Eyes:     General: Lids are normal. Vision grossly intact. No visual field deficit.    Extraocular Movements: Extraocular movements intact.     Right eye: Normal extraocular motion and no nystagmus.     Left eye: Normal extraocular motion and no nystagmus.     Conjunctiva/sclera: Conjunctivae normal.     Pupils: Pupils are equal, round, and reactive to light.  Cardiovascular:     Rate and Rhythm: Normal rate.     Pulses: Normal pulses.  Pulmonary:     Effort: Pulmonary effort is normal. No respiratory distress.     Breath sounds: Normal breath sounds.  Abdominal:     General: Bowel sounds are normal. There is no distension.     Palpations: Abdomen is soft.     Tenderness: There is no abdominal tenderness. There is no guarding or rebound.  Musculoskeletal:     Cervical back: Normal range of motion and neck supple. No rigidity.     Right lower leg: No edema.     Left lower leg: No edema.  Skin:    Coloration: Skin is not jaundiced or pale.  Neurological:     General: No focal deficit present.     Mental Status: He is alert and oriented to person, place, and time. Mental status is at baseline.     GCS: GCS eye subscore is 4. GCS verbal subscore is 5. GCS motor subscore is 6.     Cranial Nerves: No cranial nerve deficit, dysarthria or facial asymmetry.      Sensory: No sensory deficit.     Motor: No weakness, abnormal muscle tone, seizure activity or pronator drift.     Coordination: Coordination normal. Finger-Nose-Finger Test and Heel to [facility] Test normal.     Gait: Gait normal.     Deep Tendon Reflexes: Reflexes normal.     Comments:  Sensation 2/2 of BUE, RLE. 1/1 of LLE at baseline from peripheral neuropathy.  Motor 5/5 of BUE and BLE.  No slurred speech or aphasia.  No facial droop nor pronator drift.  Following commands appropriately.     (all labs ordered are listed, but only abnormal results are displayed) Labs Reviewed  COMPREHENSIVE METABOLIC PANEL WITH GFR - Abnormal; Notable for the following components:      Result Value   Potassium 3.1 (*)    Chloride 115 (*)    CO2 19 (*)    Calcium  7.2 (*)    Total Protein 5.3 (*)    Albumin 2.7 (*)    AST 13 (*)    All other components within normal limits  CBC - Abnormal; Notable for the following components:   RBC 2.97 (*)    Hemoglobin 9.7 (*)    HCT 30.4 (*)    MCV 102.4 (*)    All other components within normal limits  URINALYSIS, ROUTINE W REFLEX MICROSCOPIC - Abnormal; Notable for the following components:   Glucose, UA >=500 (*)    Bacteria, UA RARE (*)    All other components within normal limits  CBG MONITORING, ED  TROPONIN I (HIGH SENSITIVITY)    EKG: EKG Interpretation Date/Time:  Friday May 14 2024 17:18:33 EDT Ventricular Rate:  65 PR Interval:  191 QRS Duration:  99 QT Interval:  413 QTC Calculation: 430 R Axis:   -24  Text Interpretation: Sinus rhythm Borderline left axis deviation Abnormal R-wave progression, early transition since last tracing no significant change Confirmed by Lenor Hollering [phone]) on 05/14/2024 6:59:48 PM  Radiology: CT Cervical Spine Wo Contrast Result Date: 05/14/2024 EXAM: CT CERVICAL SPINE WITHOUT CONTRAST 05/14/2024 07:27:00 PM TECHNIQUE: CT of the cervical spine was performed without the administration of intravenous  contrast. Multiplanar reformatted images are provided for review. Automated exposure control, iterative reconstruction, and/or weight based adjustment of the mA/kV was utilized to reduce the radiation dose to as low as reasonably achievable. COMPARISON: MR of the cervical spine 06/04/2022. CLINICAL HISTORY: Neck pain, acute, no red flags. Syncopal Episode; Hypotension; CT Head Wo Contrast; r/o ich; CT Cervical Spine Wo Contrast; Neck pain, acute, no red flags; See ED Notes:; Pt BIB [facility] from home d/t a witness syncopal episode by family. Pt was sitting on his back porch when he began feeling hot \\T \ denies drinking much water today \\T \ his nephew stated that he was sweating large amounts \\T \ had LOC without falling out of the chair. EMS reports upon their arrival he was still very diaphoretic \\T \ sitting BP was 72/50, standing BP was 85/51 \\T \ after 1 full Liter of NS vis 18g Rt FA PIV his BP was 124/70. A/Ox4. FINDINGS: CERVICAL SPINE: BONES AND ALIGNMENT: No acute fracture or traumatic malalignment. Straightening of the normal cervical lordosis is present. Grade 1 retrolisthesis at C3-C4 is stable. Ankylosis is present across the C3-C4 disc space. DEGENERATIVE CHANGES: Focal degenerative change and uncovertebral spurring is present at C5-C6. Foraminal narrowing is present bilaterally at C3-C4 and C5-C6. SOFT TISSUES: No prevertebral soft tissue swelling. IMPRESSION: 1. No acute abnormality of the cervical spine related to the provided clinical history. 2. Stable grade 1 retrolisthesis at C3-4 with ankylosis across the C3-4 disc space. 3. Focal degenerative change and uncovertebral spurring at C5-6. 4. Bilateral foraminal narrowing at C3-4 and C5-6. Electronically signed by: Lonni Necessary MD 05/14/2024 07:57 PM EDT RP Workstation: [id number]   CT Head Wo Contrast Result Date: 05/14/2024 EXAM: CT HEAD  WITHOUT CONTRAST 05/14/2024 07:27:00 PM TECHNIQUE: CT of the head was performed without the administration  of intravenous contrast. Automated exposure control, iterative reconstruction, and/or weight based adjustment of the mA/kV was utilized to reduce the radiation dose to as low as reasonably achievable. COMPARISON: None available. CLINICAL HISTORY: Rule out intracranial hemorrhage (ICH). Patient presented with a witnessed syncopal episode, diaphoresis, and hypotension, which improved after fluid administration. FINDINGS: BRAIN AND VENTRICLES: No acute hemorrhage. No evidence of acute infarct. No hydrocephalus. No extra-axial collection. No mass effect or midline shift. Atherosclerotic calcifications are present in the cavernous carotid arteries bilaterally. No hyperdense vessel is present. ORBITS: No acute abnormality. SINUSES: No acute abnormality. SOFT TISSUES AND SKULL: No acute soft tissue abnormality. No skull fracture. IMPRESSION: 1. No acute intracranial abnormality. 2. Atherosclerotic calcifications in the cavernous carotid arteries bilaterally. No hyperdense vessel. Electronically signed by: Lonni Necessary MD 05/14/2024 07:49 PM EDT RP Workstation: [id number]     Medications Ordered in the ED  potassium chloride  SA (KLOR-CON  M) CR tablet 40 mEq (40 mEq Oral Given 05/14/24 1926)  sodium chloride  0.9 % bolus 500 mL (0 mLs Intravenous Stopped 05/14/24 2145)                                    Medical Decision Making Amount and/or Complexity of Data Reviewed Labs: ordered. Radiology: ordered.  Risk Prescription drug management.   Patient presents to the ED for concern of syncope, this involves an extensive number of treatment options, and is a complaint that carries with it a high risk of complications and morbidity.  The differential diagnosis includes ACS, dehydration, EtOH abuse, electrolyte abnormality, hypoglycemia, hyperglycemia, UTI, vasovagal   Co morbidities that complicate the patient evaluation  Multiple.  See HPI   Additional history obtained:  Additional history  obtained from Nursing and Outside Medical Records   External records from outside source obtained and reviewed including triage RN note, echo from 2023   Lab Tests:  I Ordered, and personally interpreted labs.  The pertinent results include:   CBG 88 Hgb 9.7    Cardiac Monitoring:  The patient was maintained on a cardiac monitor.  I personally viewed and interpreted the cardiac monitored which showed an underlying rhythm of: NSR at 65 bpm with inverted T waves in lead III which is unchanged from previous EKG   Medicines ordered and prescription drug management:  I ordered medication including potassium  for hypokalemia  Reevaluation of the patient after these medicines showed that the patient improved I have reviewed the patients home medicines and have made adjustments as needed     Problem List / ED Course:  Syncope Did obtain CT imaging as he is a chronic drinker and may have had a head injury that he was unaware of.  Fortunately, CT without ICH LOC for a few seconds per nephew with associated urination on himself but no seizure-like activity.  No history of seizures.  No postictal confusion reported by nephew nor EMS Family at bedside denies any altered mentation since incident nor while in ED Appears to have a prodrome of feeling hot but obtained trop d/t sudden diaphoresis and age, which is fortunately negative.  Do not feel that he needs a second troponin as he has no complaints of chest pain was just trying to rule out cardiac etiology.  EKG similar to prior. Lab work notable for mild hypokalemia but otherwise unremarkable.  No  UTI.  No AKI.  CBG WNL  Hypotension Resolved following 1L IVF provided by EMS.  Currently hemodynamically stable with no tachycardia nor hypotension No new medications and reports compliance with current medications Sounds to be related to dehydration, not eating, feeling hot as he was outside however will obtain labs, troponin, EKG to ensure no  other etiology Does have 30 point drop in SBP from sitting to standing. No complaints of dizziness nor lightheadedness.  Provided patient an additional 500 cc IVF.  Had patient ambulate around ED without any complaints Following completion of ED workup, patient asked to be discharged as he is feeling completely better Return precautions provided  Hypokalemia 3.1 No flattened T waves on EKG Provided p.o. supplementation in ED as well as 5-day prescription. Discussed that patient should follow-up with PCP in next 1-2 weeks to have potassium rechecked   Reevaluation:  After the interventions noted above, I reevaluated the patient and found that they have :resolved   Social Determinants of Health:  Former tobacco abuse Etoh abuse   Dispostion:  After consideration of the diagnostic results and the patients response to treatment, I feel that the patent would benefit from outpatient management with symptomatic care, decreased EtOH use.   Discussed ED workup, disposition, return to ED precautions with patient who expresses understanding agrees with plan.  All questions answered to their satisfaction.  They are agreeable to plan.  Discharge instructions provided on paperwork  Final diagnoses:  Hypokalemia  Syncope, unspecified syncope type  Hypotension, unspecified hypotension type    ED Discharge Orders          Ordered    potassium chloride  SA (KLOR-CON  M) 20 MEQ tablet  Daily        05/14/24 2203             Minnie Tinnie BRAVO, PA 05/14/24 2240    Lenor Hollering, MD 05/17/24 [phone]

## 2024-05-14 NOTE — ED Triage Notes (Signed)
 Pt BIB [facility] from home d/t a witness syncopal episode by family. Pt was sitting on his back porch when he began feeling hot & denies drinking much water today & his nephew stated that he was sweating large amounts & had LOC without falling out of the chair. EMS reports upon their arrival he was still very diaphoretic & sitting BP was 72/50, standing BP was 85/51 & after 1 full Liter of NS vis 18g Rt FA PIV his BP was 124/70. A/Ox4.

## 2024-05-17 ENCOUNTER — Other Ambulatory Visit (HOSPITAL_COMMUNITY): Payer: Self-pay

## 2024-05-17 DIAGNOSIS — E876 Hypokalemia: Secondary | ICD-10-CM | POA: Diagnosis not present

## 2024-05-18 ENCOUNTER — Other Ambulatory Visit (HOSPITAL_COMMUNITY): Payer: Self-pay

## 2024-05-24 DIAGNOSIS — I10 Essential (primary) hypertension: Secondary | ICD-10-CM | POA: Diagnosis not present

## 2024-05-24 DIAGNOSIS — E876 Hypokalemia: Secondary | ICD-10-CM | POA: Diagnosis not present

## 2024-05-24 DIAGNOSIS — M5090 Cervical disc disorder, unspecified, unspecified cervical region: Secondary | ICD-10-CM | POA: Diagnosis not present

## 2024-05-24 DIAGNOSIS — Z794 Long term (current) use of insulin: Secondary | ICD-10-CM | POA: Diagnosis not present

## 2024-05-24 DIAGNOSIS — E1140 Type 2 diabetes mellitus with diabetic neuropathy, unspecified: Secondary | ICD-10-CM | POA: Diagnosis not present

## 2024-05-24 DIAGNOSIS — E113313 Type 2 diabetes mellitus with moderate nonproliferative diabetic retinopathy with macular edema, bilateral: Secondary | ICD-10-CM | POA: Diagnosis not present

## 2024-05-24 DIAGNOSIS — E1151 Type 2 diabetes mellitus with diabetic peripheral angiopathy without gangrene: Secondary | ICD-10-CM | POA: Diagnosis not present

## 2024-05-24 DIAGNOSIS — I739 Peripheral vascular disease, unspecified: Secondary | ICD-10-CM | POA: Diagnosis not present

## 2024-05-24 DIAGNOSIS — E1121 Type 2 diabetes mellitus with diabetic nephropathy: Secondary | ICD-10-CM | POA: Diagnosis not present

## 2024-05-24 DIAGNOSIS — Z23 Encounter for immunization: Secondary | ICD-10-CM | POA: Diagnosis not present

## 2024-05-24 DIAGNOSIS — G4733 Obstructive sleep apnea (adult) (pediatric): Secondary | ICD-10-CM | POA: Diagnosis not present

## 2024-05-26 ENCOUNTER — Other Ambulatory Visit (HOSPITAL_COMMUNITY): Payer: Self-pay

## 2024-06-01 ENCOUNTER — Other Ambulatory Visit (HOSPITAL_COMMUNITY): Payer: Self-pay

## 2024-06-07 ENCOUNTER — Other Ambulatory Visit (HOSPITAL_COMMUNITY): Payer: Self-pay

## 2024-06-13 DIAGNOSIS — G4733 Obstructive sleep apnea (adult) (pediatric): Secondary | ICD-10-CM | POA: Diagnosis not present

## 2024-06-16 ENCOUNTER — Other Ambulatory Visit (HOSPITAL_COMMUNITY): Payer: Self-pay

## 2024-06-16 MED ORDER — ATORVASTATIN CALCIUM 80 MG PO TABS
80.0000 mg | ORAL_TABLET | Freq: Every day | ORAL | 1 refills | Status: AC
  Filled 2024-06-16 – 2024-09-08 (×2): qty 90, 90d supply, fill #0

## 2024-06-17 ENCOUNTER — Other Ambulatory Visit (HOSPITAL_COMMUNITY): Payer: Self-pay

## 2024-06-17 ENCOUNTER — Other Ambulatory Visit: Payer: Self-pay

## 2024-06-22 ENCOUNTER — Other Ambulatory Visit: Payer: Self-pay

## 2024-06-22 ENCOUNTER — Ambulatory Visit

## 2024-06-22 ENCOUNTER — Other Ambulatory Visit (HOSPITAL_COMMUNITY): Payer: Self-pay

## 2024-06-22 DIAGNOSIS — M86172 Other acute osteomyelitis, left ankle and foot: Secondary | ICD-10-CM

## 2024-06-22 DIAGNOSIS — Z9889 Other specified postprocedural states: Secondary | ICD-10-CM

## 2024-06-22 DIAGNOSIS — Z89422 Acquired absence of other left toe(s): Secondary | ICD-10-CM

## 2024-06-22 DIAGNOSIS — E1140 Type 2 diabetes mellitus with diabetic neuropathy, unspecified: Secondary | ICD-10-CM

## 2024-06-22 DIAGNOSIS — E1149 Type 2 diabetes mellitus with other diabetic neurological complication: Secondary | ICD-10-CM

## 2024-06-22 DIAGNOSIS — I999 Unspecified disorder of circulatory system: Secondary | ICD-10-CM

## 2024-06-22 MED ORDER — AMOXICILLIN-POT CLAVULANATE 875-125 MG PO TABS
1.0000 | ORAL_TABLET | Freq: Two times a day (BID) | ORAL | 0 refills | Status: DC
  Filled 2024-06-22: qty 20, 10d supply, fill #0
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
## 2024-06-23 ENCOUNTER — Emergency Department (HOSPITAL_COMMUNITY)

## 2024-06-23 ENCOUNTER — Other Ambulatory Visit: Payer: Self-pay

## 2024-06-23 ENCOUNTER — Inpatient Hospital Stay (HOSPITAL_COMMUNITY)
Admission: EM | Admit: 2024-06-23 | Discharge: 2024-06-26 | DRG: 617 | Disposition: A | Discharge status: Home / Self Care | Source: Ambulatory Visit | Attending: Internal Medicine | Admitting: Internal Medicine

## 2024-06-23 DIAGNOSIS — I10 Essential (primary) hypertension: Secondary | ICD-10-CM | POA: Diagnosis not present

## 2024-06-23 DIAGNOSIS — Z89422 Acquired absence of other left toe(s): Secondary | ICD-10-CM | POA: Diagnosis not present

## 2024-06-23 DIAGNOSIS — I739 Peripheral vascular disease, unspecified: Secondary | ICD-10-CM | POA: Diagnosis not present

## 2024-06-23 DIAGNOSIS — Z794 Long term (current) use of insulin: Secondary | ICD-10-CM

## 2024-06-23 DIAGNOSIS — E11621 Type 2 diabetes mellitus with foot ulcer: Secondary | ICD-10-CM | POA: Diagnosis present

## 2024-06-23 DIAGNOSIS — D649 Anemia, unspecified: Secondary | ICD-10-CM | POA: Diagnosis present

## 2024-06-23 DIAGNOSIS — I5032 Chronic diastolic (congestive) heart failure: Secondary | ICD-10-CM | POA: Diagnosis present

## 2024-06-23 DIAGNOSIS — Z8249 Family history of ischemic heart disease and other diseases of the circulatory system: Secondary | ICD-10-CM

## 2024-06-23 DIAGNOSIS — Z7984 Long term (current) use of oral hypoglycemic drugs: Secondary | ICD-10-CM | POA: Diagnosis not present

## 2024-06-23 DIAGNOSIS — Z79899 Other long term (current) drug therapy: Secondary | ICD-10-CM | POA: Diagnosis not present

## 2024-06-23 DIAGNOSIS — Z833 Family history of diabetes mellitus: Secondary | ICD-10-CM | POA: Diagnosis not present

## 2024-06-23 DIAGNOSIS — E1152 Type 2 diabetes mellitus with diabetic peripheral angiopathy with gangrene: Secondary | ICD-10-CM | POA: Diagnosis present

## 2024-06-23 DIAGNOSIS — Z87891 Personal history of nicotine dependence: Secondary | ICD-10-CM | POA: Diagnosis not present

## 2024-06-23 DIAGNOSIS — Z8619 Personal history of other infectious and parasitic diseases: Secondary | ICD-10-CM | POA: Diagnosis not present

## 2024-06-23 DIAGNOSIS — M869 Osteomyelitis, unspecified: Principal | ICD-10-CM | POA: Diagnosis present

## 2024-06-23 DIAGNOSIS — L97529 Non-pressure chronic ulcer of other part of left foot with unspecified severity: Secondary | ICD-10-CM | POA: Diagnosis present

## 2024-06-23 DIAGNOSIS — E1142 Type 2 diabetes mellitus with diabetic polyneuropathy: Secondary | ICD-10-CM | POA: Diagnosis present

## 2024-06-23 DIAGNOSIS — E1169 Type 2 diabetes mellitus with other specified complication: Principal | ICD-10-CM | POA: Diagnosis present

## 2024-06-23 DIAGNOSIS — I110 Hypertensive heart disease with heart failure: Secondary | ICD-10-CM | POA: Diagnosis present

## 2024-06-23 DIAGNOSIS — L0390 Cellulitis, unspecified: Secondary | ICD-10-CM | POA: Diagnosis not present

## 2024-06-23 DIAGNOSIS — Z8616 Personal history of COVID-19: Secondary | ICD-10-CM | POA: Diagnosis not present

## 2024-06-23 DIAGNOSIS — E7800 Pure hypercholesterolemia, unspecified: Secondary | ICD-10-CM | POA: Diagnosis present

## 2024-06-23 DIAGNOSIS — G4730 Sleep apnea, unspecified: Secondary | ICD-10-CM | POA: Diagnosis not present

## 2024-06-23 DIAGNOSIS — F32A Depression, unspecified: Secondary | ICD-10-CM | POA: Diagnosis present

## 2024-06-23 DIAGNOSIS — Z7902 Long term (current) use of antithrombotics/antiplatelets: Secondary | ICD-10-CM | POA: Diagnosis not present

## 2024-06-23 DIAGNOSIS — E785 Hyperlipidemia, unspecified: Secondary | ICD-10-CM | POA: Diagnosis present

## 2024-06-23 DIAGNOSIS — E1165 Type 2 diabetes mellitus with hyperglycemia: Secondary | ICD-10-CM | POA: Diagnosis present

## 2024-06-23 DIAGNOSIS — Z7982 Long term (current) use of aspirin: Secondary | ICD-10-CM | POA: Diagnosis not present

## 2024-06-23 DIAGNOSIS — M86172 Other acute osteomyelitis, left ankle and foot: Secondary | ICD-10-CM | POA: Diagnosis present

## 2024-06-23 DIAGNOSIS — K769 Liver disease, unspecified: Secondary | ICD-10-CM | POA: Diagnosis present

## 2024-06-23 DIAGNOSIS — F419 Anxiety disorder, unspecified: Secondary | ICD-10-CM | POA: Diagnosis present

## 2024-06-23 DIAGNOSIS — Z823 Family history of stroke: Secondary | ICD-10-CM

## 2024-06-23 DIAGNOSIS — Z9889 Other specified postprocedural states: Secondary | ICD-10-CM

## 2024-06-23 DIAGNOSIS — E119 Type 2 diabetes mellitus without complications: Secondary | ICD-10-CM

## 2024-06-23 LAB — URINALYSIS, W/ REFLEX TO CULTURE (INFECTION SUSPECTED)
Bacteria, UA: NONE SEEN
Bilirubin Urine: NEGATIVE
Glucose, UA: >=500 mg/dL — AB
Hgb urine dipstick: NEGATIVE
Ketones, ur: NEGATIVE mg/dL
Leukocytes,Ua: NEGATIVE
Nitrite: NEGATIVE
Protein, ur: NEGATIVE mg/dL
Specific Gravity, Urine: 1.03 (ref 1.005–1.030)
pH: 5 (ref 5.0–8.0)

## 2024-06-23 LAB — COMPREHENSIVE METABOLIC PANEL WITH GFR
ALT: 10 U/L (ref 0–44)
AST: 14 U/L — ABNORMAL LOW (ref 15–41)
Albumin: 3.5 g/dL (ref 3.5–5.0)
Alkaline Phosphatase: 87 U/L (ref 38–126)
Anion gap: 14 (ref 5–15)
BUN: 20 mg/dL (ref 8–23)
CO2: 22 mmol/L (ref 22–32)
Calcium: 9.2 mg/dL (ref 8.9–10.3)
Chloride: 103 mmol/L (ref 98–111)
Creatinine, Ser: 1.17 mg/dL (ref 0.61–1.24)
GFR, Estimated: >60 mL/min (ref >60)
Glucose, Bld: 173 mg/dL — ABNORMAL HIGH (ref 70–99)
Potassium: 4.2 mmol/L (ref 3.5–5.1)
Sodium: 139 mmol/L (ref 135–145)
Total Bilirubin: 0.8 mg/dL (ref 0.0–1.2)
Total Protein: 7.5 g/dL (ref 6.5–8.1)

## 2024-06-23 LAB — CBC WITH DIFFERENTIAL/PLATELET
Abs Immature Granulocytes: 0.01 K/uL (ref 0.00–0.07)
Basophils Absolute: 0.1 K/uL (ref 0.0–0.1)
Basophils Relative: 1 %
Eosinophils Absolute: 1.6 K/uL — ABNORMAL HIGH (ref 0.0–0.5)
Eosinophils Relative: 22 %
HCT: 40.4 % (ref 39.0–52.0)
Hemoglobin: 12.9 g/dL — ABNORMAL LOW (ref 13.0–17.0)
Immature Granulocytes: 0 %
Lymphocytes Relative: 28 %
Lymphs Abs: 2 K/uL (ref 0.7–4.0)
MCH: 31.7 pg (ref 26.0–34.0)
MCHC: 31.9 g/dL (ref 30.0–36.0)
MCV: 99.3 fL (ref 80.0–100.0)
Monocytes Absolute: 0.5 K/uL (ref 0.1–1.0)
Monocytes Relative: 7 %
Neutro Abs: 3.1 K/uL (ref 1.7–7.7)
Neutrophils Relative %: 42 %
Platelets: 348 K/uL (ref 150–400)
RBC: 4.07 MIL/uL — ABNORMAL LOW (ref 4.22–5.81)
RDW: 12.7 % (ref 11.5–15.5)
WBC: 7.2 K/uL (ref 4.0–10.5)
nRBC: 0 % (ref 0.0–0.2)

## 2024-06-23 LAB — I-STAT CG4 LACTIC ACID, ED
Lactic Acid, Venous: 1.1 mmol/L (ref 0.5–1.9)
Lactic Acid, Venous: 0.7 mmol/L (ref 0.5–1.9)

## 2024-06-23 LAB — C-REACTIVE PROTEIN: CRP: 1.1 mg/dL — ABNORMAL HIGH (ref <1.0)

## 2024-06-23 LAB — RESP PANEL BY RT-PCR (RSV, FLU A&B, COVID)  RVPGX2
Influenza A by PCR: NEGATIVE
Influenza B by PCR: NEGATIVE
Resp Syncytial Virus by PCR: NEGATIVE
SARS Coronavirus 2 by RT PCR: NEGATIVE

## 2024-06-23 LAB — MRSA NEXT GEN BY PCR, NASAL: MRSA by PCR Next Gen: NOT DETECTED

## 2024-06-23 LAB — CBG MONITORING, ED: Glucose-Capillary: 141 mg/dL — ABNORMAL HIGH (ref 70–99)

## 2024-06-23 LAB — SEDIMENTATION RATE: Sed Rate: 33 mm/h — ABNORMAL HIGH (ref 0–16)

## 2024-06-23 MED ORDER — INSULIN GLARGINE-YFGN 100 UNIT/ML ~~LOC~~ SOLN
15.0000 [IU] | Freq: Every day | SUBCUTANEOUS | Status: DC
  Administered 2024-06-24 – 2024-06-25 (×2): 15 [IU] via SUBCUTANEOUS
  Filled 2024-06-23 (×4): qty 0.15

## 2024-06-23 MED ORDER — ACETAMINOPHEN 325 MG PO TABS: 650.0000 mg | ORAL_TABLET | Freq: Four times a day (QID) | ORAL | Status: DC | PRN

## 2024-06-23 MED ORDER — ALBUTEROL SULFATE (2.5 MG/3ML) 0.083% IN NEBU
2.5000 mg | INHALATION_SOLUTION | RESPIRATORY_TRACT | Status: DC | PRN
Start: 2024-06-23 — End: 2024-06-26

## 2024-06-23 MED ORDER — LINEZOLID 600 MG/300ML IV SOLN
600.0000 mg | Freq: Once | INTRAVENOUS | Status: AC
  Administered 2024-06-23: 600 mg via INTRAVENOUS
  Filled 2024-06-23: qty 300

## 2024-06-23 MED ORDER — GABAPENTIN 300 MG PO CAPS
300.0000 mg | ORAL_CAPSULE | Freq: Four times a day (QID) | ORAL | Status: DC
  Administered 2024-06-24 – 2024-06-26 (×9): 300 mg via ORAL
  Filled 2024-06-23 (×9): qty 1

## 2024-06-23 MED ORDER — METOPROLOL SUCCINATE ER 25 MG PO TB24
25.0000 mg | ORAL_TABLET | Freq: Every day | ORAL | Status: DC
  Administered 2024-06-24 – 2024-06-26 (×3): 25 mg via ORAL
  Filled 2024-06-23 (×3): qty 1

## 2024-06-23 MED ORDER — CLOPIDOGREL BISULFATE 75 MG PO TABS
75.0000 mg | ORAL_TABLET | Freq: Every day | ORAL | Status: DC
  Administered 2024-06-24 – 2024-06-26 (×3): 75 mg via ORAL
  Filled 2024-06-23 (×3): qty 1

## 2024-06-23 MED ORDER — SODIUM CHLORIDE 0.9% FLUSH: 3.0000 mL | INTRAVENOUS | Status: DC | PRN

## 2024-06-23 MED ORDER — DULOXETINE HCL 60 MG PO CPEP
60.0000 mg | ORAL_CAPSULE | Freq: Every day | ORAL | Status: DC
  Administered 2024-06-24 – 2024-06-26 (×3): 60 mg via ORAL
  Filled 2024-06-23 (×3): qty 1

## 2024-06-23 MED ORDER — SODIUM CHLORIDE 0.9 % IV SOLN
3.0000 g | Freq: Four times a day (QID) | INTRAVENOUS | Status: DC
  Administered 2024-06-23 – 2024-06-25 (×6): 3 g via INTRAVENOUS
  Filled 2024-06-23 (×6): qty 8

## 2024-06-23 MED ORDER — ACETAMINOPHEN 650 MG RE SUPP: 650.0000 mg | Freq: Four times a day (QID) | RECTAL | Status: DC | PRN

## 2024-06-23 MED ORDER — LINEZOLID 600 MG/300ML IV SOLN
600.0000 mg | Freq: Two times a day (BID) | INTRAVENOUS | Status: DC
  Administered 2024-06-24 – 2024-06-25 (×3): 600 mg via INTRAVENOUS
  Filled 2024-06-23 (×3): qty 300

## 2024-06-23 MED ORDER — ATORVASTATIN CALCIUM 80 MG PO TABS
80.0000 mg | ORAL_TABLET | Freq: Every day | ORAL | Status: DC
  Administered 2024-06-24 – 2024-06-26 (×3): 80 mg via ORAL
  Filled 2024-06-23 (×3): qty 1

## 2024-06-23 MED ORDER — HYDROMORPHONE HCL 1 MG/ML IJ SOLN
0.5000 mg | Freq: Once | INTRAMUSCULAR | Status: AC
  Administered 2024-06-23: 0.5 mg via INTRAVENOUS
  Filled 2024-06-23: qty 1

## 2024-06-23 MED ORDER — LORAZEPAM 2 MG/ML IJ SOLN: 1.0000 mg | Freq: Once | INTRAMUSCULAR | Status: DC | PRN

## 2024-06-23 MED ORDER — ONDANSETRON HCL 4 MG/2ML IJ SOLN
4.0000 mg | Freq: Four times a day (QID) | INTRAMUSCULAR | Status: DC | PRN
  Administered 2024-06-25: 4 mg via INTRAVENOUS

## 2024-06-23 MED ORDER — HYDROCODONE-ACETAMINOPHEN 5-325 MG PO TABS
1.0000 | ORAL_TABLET | ORAL | Status: DC | PRN
  Administered 2024-06-24 – 2024-06-25 (×2): 2 via ORAL
  Filled 2024-06-23 (×2): qty 2

## 2024-06-23 MED ORDER — SODIUM CHLORIDE 0.9 % IV SOLN: 250.0000 mL | INTRAVENOUS | Status: AC | PRN

## 2024-06-23 MED ORDER — GADOBUTROL 1 MMOL/ML IV SOLN
8.5000 mL | Freq: Once | INTRAVENOUS | Status: AC | PRN
  Administered 2024-06-23: 8.5 mL via INTRAVENOUS

## 2024-06-23 MED ORDER — ONDANSETRON HCL 4 MG PO TABS: 4.0000 mg | ORAL_TABLET | Freq: Four times a day (QID) | ORAL | Status: DC | PRN

## 2024-06-23 MED ORDER — INSULIN ASPART 100 UNIT/ML IJ SOLN
0.0000 [IU] | INTRAMUSCULAR | Status: DC
  Administered 2024-06-23: 1 [IU] via SUBCUTANEOUS
  Administered 2024-06-24: 3 [IU] via SUBCUTANEOUS
  Administered 2024-06-24 (×4): 2 [IU] via SUBCUTANEOUS
  Administered 2024-06-24: 3 [IU] via SUBCUTANEOUS
  Administered 2024-06-25: 2 [IU] via SUBCUTANEOUS
  Administered 2024-06-25: 3 [IU] via SUBCUTANEOUS
  Administered 2024-06-25: 1 [IU] via SUBCUTANEOUS
  Administered 2024-06-25: 5 [IU] via SUBCUTANEOUS
  Administered 2024-06-25: 1 [IU] via SUBCUTANEOUS
  Administered 2024-06-25 – 2024-06-26 (×3): 2 [IU] via SUBCUTANEOUS
  Filled 2024-06-23: qty 5
  Filled 2024-06-23 (×2): qty 2
  Filled 2024-06-23: qty 5
  Filled 2024-06-23: qty 3
  Filled 2024-06-23: qty 1
  Filled 2024-06-23 (×3): qty 2
  Filled 2024-06-23 (×2): qty 3
  Filled 2024-06-23: qty 2
  Filled 2024-06-23: qty 1
  Filled 2024-06-23 (×2): qty 2

## 2024-06-23 MED ORDER — FENTANYL CITRATE (PF) 50 MCG/ML IJ SOSY
12.5000 ug | PREFILLED_SYRINGE | INTRAMUSCULAR | Status: DC | PRN
  Administered 2024-06-23 – 2024-06-26 (×5): 50 ug via INTRAVENOUS
  Filled 2024-06-23 (×5): qty 1

## 2024-06-23 MED ORDER — SODIUM CHLORIDE 0.9% FLUSH
3.0000 mL | Freq: Two times a day (BID) | INTRAVENOUS | Status: DC
  Administered 2024-06-24 – 2024-06-26 (×5): 3 mL via INTRAVENOUS

## 2024-06-23 MED ORDER — SODIUM CHLORIDE 0.9 % IV SOLN
3.0000 g | Freq: Once | INTRAVENOUS | Status: AC
  Administered 2024-06-23: 3 g via INTRAVENOUS
  Filled 2024-06-23: qty 8

## 2024-06-23 NOTE — ED Notes (Signed)
Lactic 1.1

## 2024-06-23 NOTE — Assessment & Plan Note (Signed)
 Restart Toprol  25 mg daily

## 2024-06-23 NOTE — Assessment & Plan Note (Addendum)
 Appreciate vascular consult  Will defer to vascular when to restart Plavix  Patient has been off for past 24h 7:35 PM ER provider discussed case with Dr. Serene, recommended continuing Plavix  Will see patient in consult
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
## 2024-06-23 NOTE — Assessment & Plan Note (Signed)
 Continue Lipitor  80 mg daily

## 2024-06-23 NOTE — Assessment & Plan Note (Signed)
 Order sliding scale decrease home dose of insulin  down to 15 units given the patient likely will need to be n.p.o.

## 2024-06-23 NOTE — Assessment & Plan Note (Signed)
 Appreciate podiatry consult n.p.o. postmidnight continue antibiotics IV linezolid and Unasyn

## 2024-06-23 NOTE — Assessment & Plan Note (Signed)
 Continue Neurontin at home dose
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
## 2024-06-23 NOTE — ED Triage Notes (Addendum)
 Pt. Stated, Pt sent here by Dr. Magdalen for left foot pain and decreased circulation, Big toe is black and some swelling since Monday, but its not been right since my toe beside the big toe was amputated. Ive went to vascular surgeon after the toe was amputated to check and improve the circulation.

## 2024-06-23 NOTE — ED Provider Triage Note (Signed)
 Emergency Medicine Provider Triage Evaluation Note  Timothy Peters , a 73 y.o. male  was evaluated in triage.  Pt complains of needing MRI and labs for concerns for osteomyelitis in L big toe, seen by podiatry yesterday. Hx of 2nd toe amputation. Wound on great toe present progressively worse x 3-4 months. Still ambulating. Had callus removed yesterday.   Endorses L foot pain.   Denies fever, headache, vision changes, chest pain, abdominal pain, n/v/d, dysuria.     Review of Systems  Positive: N/a Negative: N/a  Physical Exam  BP (!) 179/85 (BP Location: Right Arm)   Pulse 74   Temp 98.1 F (36.7 C) (Oral)   Resp 16   Ht 5' 11 (1.803 m)   Wt 87.5 kg   SpO2 97%   BMI 26.92 kg/m  Gen:   Awake, no distress   Resp:  Normal effort  MSK:   Moves extremities without difficulty  Other:    Medical Decision Making  Medically screening exam initiated at 12:40 PM.  Appropriate orders placed.  Timothy Peters was informed that the remainder of the evaluation will be completed by another provider, this initial triage assessment does not replace that evaluation, and the importance of remaining in the ED until their evaluation is complete.     Beola Terrall RAMAN, [place] 06/23/24 1246

## 2024-06-23 NOTE — ED Triage Notes (Signed)
 Pt sent by [facility] for MRI with concern for osteomyelitis. PT is ambulatory, A&O.

## 2024-06-23 NOTE — Subjective & Objective (Signed)
 Patient with known history of diabetes and peripheral vascular disease with diabetic ulcer of his left great toe status post amputation of his second left toe already by podiatry in August 2025 He was seen in office by podiatry on 4 November when he had undergone debridement started on Augmentin  although we were not able to fill the prescription and was instructed to go to emergency department unfortunately they were not able to come until today 5 November Podiatry has been consulted Also recommended vascular consult

## 2024-06-23 NOTE — H&P (Signed)
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   Condition  --   Comment  Hospital Area: [facility] [[id number]]  Level of Care: Telemetry [5]  May admit patient to [facility] or [facility] if equivalent level of care is available:: No  Diagnosis: Osteomyelitis of great toe of left foot [facility]) [[id number]]  Admitting Physician: Renisha Cockrum [[id number]]  Attending Physician: Tamaya Pun [[id number]]  Certification:: I certify this patient will need inpatient services for at least 2 midnights  Expected Medical Readiness: 06/25/2024            inpatient     I Expect 2 midnight stay secondary to severity of patient's current illness need for inpatient interventions justified by the following:     Severe lab/radiological/exam abnormalities including:    Left great toe osteomyelitis and extensive comorbidities including:  DM2   liver disease   History of amputation    That are currently affecting medical management.   I expect  patient to be hospitalized for 2 midnights requiring inpatient medical care.  Patient is at high risk for adverse outcome (such as loss of life or disability) if not treated.  Indication for inpatient stay as follows:    Need for operative/procedural  intervention     Need for IV antibiotics, IV fluids,, IV pain medications    Level of care     tele  For 12H      Maniyah Moller 06/23/2024, 7:42 PM    [facility]     after 2 AM please page floor coverage   If 7AM-7PM, please contact the day team taking care of the patient using [email or web address]

## 2024-06-24 ENCOUNTER — Inpatient Hospital Stay (HOSPITAL_COMMUNITY)

## 2024-06-24 ENCOUNTER — Encounter (HOSPITAL_COMMUNITY): Payer: Self-pay | Admitting: Internal Medicine

## 2024-06-24 ENCOUNTER — Other Ambulatory Visit (HOSPITAL_COMMUNITY): Payer: Self-pay

## 2024-06-24 DIAGNOSIS — I739 Peripheral vascular disease, unspecified: Secondary | ICD-10-CM

## 2024-06-24 DIAGNOSIS — L0390 Cellulitis, unspecified: Secondary | ICD-10-CM

## 2024-06-24 DIAGNOSIS — M869 Osteomyelitis, unspecified: Secondary | ICD-10-CM | POA: Diagnosis not present

## 2024-06-24 LAB — CK: Total CK: 135 U/L (ref 49–397)

## 2024-06-24 LAB — VAS US ABI WITH/WO TBI
Left ABI: 1
Right ABI: 1.06

## 2024-06-24 LAB — COMPREHENSIVE METABOLIC PANEL WITH GFR
ALT: 9 U/L (ref 0–44)
AST: 15 U/L (ref 15–41)
Albumin: 2.9 g/dL — ABNORMAL LOW (ref 3.5–5.0)
Alkaline Phosphatase: 71 U/L (ref 38–126)
Anion gap: 9 (ref 5–15)
BUN: 17 mg/dL (ref 8–23)
CO2: 23 mmol/L (ref 22–32)
Calcium: 8.6 mg/dL — ABNORMAL LOW (ref 8.9–10.3)
Chloride: 105 mmol/L (ref 98–111)
Creatinine, Ser: 0.99 mg/dL (ref 0.61–1.24)
GFR, Estimated: >60 mL/min (ref >60)
Glucose, Bld: 219 mg/dL — ABNORMAL HIGH (ref 70–99)
Potassium: 3.8 mmol/L (ref 3.5–5.1)
Sodium: 137 mmol/L (ref 135–145)
Total Bilirubin: 0.7 mg/dL (ref 0.0–1.2)
Total Protein: 6.5 g/dL (ref 6.5–8.1)

## 2024-06-24 LAB — MAGNESIUM
Magnesium: 1.9 mg/dL (ref 1.7–2.4)
Magnesium: 2 mg/dL (ref 1.7–2.4)

## 2024-06-24 LAB — GLUCOSE, CAPILLARY
Glucose-Capillary: 163 mg/dL — ABNORMAL HIGH (ref 70–99)
Glucose-Capillary: 167 mg/dL — ABNORMAL HIGH (ref 70–99)
Glucose-Capillary: 179 mg/dL — ABNORMAL HIGH (ref 70–99)
Glucose-Capillary: 182 mg/dL — ABNORMAL HIGH (ref 70–99)
Glucose-Capillary: 217 mg/dL — ABNORMAL HIGH (ref 70–99)
Glucose-Capillary: 226 mg/dL — ABNORMAL HIGH (ref 70–99)

## 2024-06-24 LAB — PREALBUMIN: Prealbumin: 19 mg/dL (ref 18–38)

## 2024-06-24 LAB — PHOSPHORUS
Phosphorus: 2.9 mg/dL (ref 2.5–4.6)
Phosphorus: 3 mg/dL (ref 2.5–4.6)

## 2024-06-24 LAB — CBC
HCT: 37.2 % — ABNORMAL LOW (ref 39.0–52.0)
Hemoglobin: 12.2 g/dL — ABNORMAL LOW (ref 13.0–17.0)
MCH: 31.8 pg (ref 26.0–34.0)
MCHC: 32.8 g/dL (ref 30.0–36.0)
MCV: 96.9 fL (ref 80.0–100.0)
Platelets: 297 K/uL (ref 150–400)
RBC: 3.84 MIL/uL — ABNORMAL LOW (ref 4.22–5.81)
RDW: 12.5 % (ref 11.5–15.5)
WBC: 6.5 K/uL (ref 4.0–10.5)
nRBC: 0 % (ref 0.0–0.2)

## 2024-06-24 LAB — HEMOGLOBIN A1C
Hgb A1c MFr Bld: 7.1 % — ABNORMAL HIGH (ref 4.8–5.6)
Mean Plasma Glucose: 157.07 mg/dL

## 2024-06-24 MED ORDER — JUVEN PO PACK
1.0000 | PACK | Freq: Two times a day (BID) | ORAL | Status: DC
  Administered 2024-06-24 – 2024-06-26 (×3): 1 via ORAL
  Filled 2024-06-24 (×3): qty 1

## 2024-06-24 MED ORDER — ENSURE PLUS HIGH PROTEIN PO LIQD
237.0000 mL | Freq: Every day | ORAL | Status: DC
  Administered 2024-06-26: 237 mL via ORAL

## 2024-06-24 MED ORDER — LOSARTAN POTASSIUM 50 MG PO TABS
100.0000 mg | ORAL_TABLET | Freq: Every day | ORAL | Status: DC
  Administered 2024-06-24 – 2024-06-26 (×3): 100 mg via ORAL
  Filled 2024-06-24 (×3): qty 2

## 2024-06-24 MED ORDER — ASPIRIN 81 MG PO TBEC
81.0000 mg | DELAYED_RELEASE_TABLET | Freq: Every day | ORAL | Status: DC
  Administered 2024-06-24 – 2024-06-26 (×3): 81 mg via ORAL
  Filled 2024-06-24 (×3): qty 1

## 2024-06-24 NOTE — TOC Initial Note (Signed)
 Transition of Care [facility]) - Initial/Assessment Note    Patient Details  Name: Timothy Peters MRN: [id number] Date of Birth: [birth year]  Transition of Care [facility]) CM/SW Contact:    Rosaline JONELLE Joe, RN Phone Number: 06/24/2024, 2:24 PM  Clinical Narrative:                 CM met with the patient at the bedside to discuss IP Care management needs.  Patient has Left big toe infection and plans to have Left big toe amputation by podiatry tomorrow.  Patient lives at home with spouse and is normally independent.  Patient does not drive but uses Medicare transportation to appointments.  Patient has DME at the home including Glucose sensor for glucose monitoring, cane (doesn't use ).  He states that he has RW and WC in his garage but does not use himself.  Patient quit smoking 20 years ago and remains on RA.  Patient plans to return home with spouse post-surgery.  Patient has had toe amputation in the past that did not warrant home health needs at that time.  CM will continue to follow the patient.  No IP Care management needs at this time.  I will follow up with the patient post-surgery to see if patient has needs post-operatively.        Patient Goals and CMS Choice            Expected Discharge Plan and Services                                              Prior Living Arrangements/Services                       Activities of Daily Living   ADL Screening (condition at time of admission) Independently performs ADLs?: Yes (appropriate for developmental age) Is the patient deaf or have difficulty hearing?: No Does the patient have difficulty seeing, even when wearing glasses/contacts?: No Does the patient have difficulty concentrating, remembering, or making decisions?: No  Permission Sought/Granted                  Emotional Assessment              Admission diagnosis:  Osteomyelitis of great toe of left foot (HCC) [M86.9] Patient  Active Problem List   Diagnosis Date Noted   Osteomyelitis of great toe of left foot (HCC) 06/23/2024   PAD (peripheral artery disease) 06/23/2024   Acute osteomyelitis of toe, left (HCC) 03/23/2024   Diabetic foot infection (HCC) 03/22/2024   AKI (acute kidney injury) 09/13/2022   Sleep apnea, unspecified 08/30/2022   Syncope 07/08/2022   Normocytic anemia 07/08/2022   Chronic low back pain 07/08/2022   Overweight 07/08/2022   Lumbar adjacent segment disease with spondylolisthesis 04/12/2021   Spondylolisthesis, lumbar region 09/27/2020   Multiple rib fractures 05/15/2020   Type 2 diabetes mellitus (HCC) 05/15/2020   Abnormal LFTs 05/15/2020   Spinal stenosis of lumbar region 10/30/2017   Spinal stenosis at L4-L5 level 10/30/2017   Liver fibrosis 03/19/2016   Multilevel degenerative disc disease 12/29/2015   Hepatitis C, chronic (HCC) 10/30/2015   Abdominal wall pain in both upper quadrants 09/23/2015   Cholelithiasis 09/21/2015   Uncontrolled type 2 diabetes mellitus with hyperglycemia, with long-term current use of insulin  (HCC) 03/04/2012   Health care maintenance  03/04/2012   HTN (hypertension) 10/30/2011   Hyperlipidemia 10/30/2011   Erectile dysfunction 10/30/2011   Diabetic peripheral neuropathy (HCC) 10/30/2011   History of heroin abuse (HCC) 10/30/2011   PCP:  Ransom Other, MD Pharmacy:   [place] [facility] [address] [place] [address] Phone: [phone] Fax: [phone]  [facility] - [facility] [address] [place] [address] Phone: [phone] Fax: [phone]  [facility] [address] [place] [address] Phone: [phone] Fax: [phone]     Social Drivers of Health (SDOH) Social History: SDOH Screenings   Food Insecurity: No Food Insecurity (06/23/2024)  Housing: Low Risk  (06/23/2024)  Transportation Needs: No Transportation  Needs (06/23/2024)  Utilities: Not At Risk (06/23/2024)  Social Connections: Moderately Integrated (06/24/2024)  Tobacco Use: Medium Risk (06/24/2024)   SDOH Interventions:     Readmission Risk Interventions    06/24/2024    2:24 PM 03/23/2024    3:21 PM  Readmission Risk Prevention Plan  Post Dischage Appt Complete Complete  Medication Screening Complete Complete  Transportation Screening Complete Complete
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
## 2024-06-24 NOTE — Plan of Care (Signed)

## 2024-06-24 NOTE — Progress Notes (Signed)
 PROGRESS NOTE  Timothy Peters  DOB: May 11, 1951  PCP: [name], [name], MD FMW:[id number]  DOA: 06/23/2024  LOS: 1 day  Hospital Day: 2  Subjective: Patient was seen and examined this morning. Pleasant elderly African-American male.  Lying on bed.  Not in distress.  No new symptoms. No family at bedside.  Brief narrative: Timothy Peters is a 73 y.o. male with PMH significant for DM2, HTN, HLD, PAD, diabetic foot ulcers, hiatal hernia, spinal stenosis, treated hep C. Lives at home with wife.  Uses a cane to ambulate.  In August 2025, patient underwent drug-coated balloon angioplasty of the left SFA and angioplasty of the posterior tibial and peroneal artery.  He then underwent left second toe amputation podiatry. He has noticed discoloration of his left great toe over the past several months with recent swelling, and foul-smelling drainage 11/4, he was seen at podiatry office underwent debridement.  With the concern of osteomyelitis, patient was referred to ED.  In the ED, afebrile, heart rate in 70s, blood pressure 170s, breathing on room air. Initial labs with WC count 7.2, lactic acid 1.1, CMP unremarkable MRI left foot showed 1. Active osteomyelitis in the distal phalanx of the great toe. 2. Transverse band of low T1 and high T2 signal in the proximal metaphysis of the proximal phalanx of the small toe, favoring fracture over infection. 3. Nonspecific subcutaneous edema along the dorsum of the foot tracking into the toes, consistent with cellulitis. No drainable abscess identified. 4. Mild degenerative changes in the head of the first metatarsal. 5. 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  Patient was started on broad-spectrum IV antibiotics Admitted to [facility] Vascular surgery consulted  Assessment and plan: Acute osteomyelitis of left great toe  MRI findings as above Podiatry and vascular consulted Noted plan for vascular surgery for ABI and  potentially angiography Currently on broad-spectrum IV antibiotics with IV Unasyn and IV Zyvox No fever.  WBC count normal Pain regimen on PRN Tylenol , Norco, fentanyl  Recent Labs  Lab 06/23/24 1241 06/23/24 1257 06/23/24 1741 06/24/24 0154  WBC 7.2  --   --  6.5  LATICACIDVEN  --  1.1 0.7  --    PAD S/p stent HLD In August 2025, patient underwent drug-coated balloon angioplasty of the left SFA and angioplasty of the posterior tibial and peroneal artery.  Seen by vascular surgery Dr. Serene. Noted plan for ABI and potentially angiography this hospitalization PTA meds- aspirin  81 mg daily, Plavix  75 mg daily, Lipitor  80 mg daily Continue all  Type 2 diabetes mellitus with hyperglycemia Diabetic neuropathy A1c 7.1 on 05/24/2023 PTA meds-Tresiba  28 units daily, metformin  1000 mg twice daily glipizide  PRN Currently on Semglee  15 units daily, SSI/Accu-Cheks Continue Neurontin  300 mg 4 times daily Recent Labs  Lab 06/23/24 2005 06/24/24 0030 06/24/24 0507 06/24/24 0725  GLUCAP 141* 217* 226* 182*   HTN (hypertension) PTA meds- Toprol  25 mg daily, losartan  100 mg daily Resume both  Anxiety/depression Continue Cymbalta  60 mg daily   Mobility: Uses a cane to ambulate.  May need PT postprocedure  Goals of care   Code Status: Full Code    DVT prophylaxis:  SCDs Start: 06/23/24 2317   Antimicrobials: IV Unasyn and IV Zyvox Fluid: None Consultants: Vascular, podiatry Family Communication: None at bedside  Status: Inpatient Level of care:  Telemetry   Patient is from: Home Needs to continue in-hospital care: Pending angiogram, podiatry input Anticipated d/c to: Pending clinical course   Diet:  Diet Order  Diet heart healthy/carb modified Room service appropriate? Yes; Fluid consistency: Thin  Diet effective now                   Scheduled Meds:  aspirin  EC  81 mg Oral Daily   atorvastatin   80 mg Oral Daily   clopidogrel   75 mg Oral Daily    DULoxetine   60 mg Oral Daily   gabapentin   300 mg Oral QID   insulin  aspart  0-9 Units Subcutaneous Q4H   insulin  glargine-yfgn  15 Units Subcutaneous Q2200   losartan   100 mg Oral Daily   metoprolol  succinate  25 mg Oral Daily   sodium chloride  flush  3 mL Intravenous Q12H    PRN meds: sodium chloride , acetaminophen  **OR** acetaminophen , albuterol , fentaNYL  (SUBLIMAZE ) injection, HYDROcodone -acetaminophen , ondansetron  **OR** ondansetron  (ZOFRAN ) IV, sodium chloride  flush   Infusions:   sodium chloride      ampicillin-sulbactam (UNASYN) IV 3 g (06/24/24 0507)   linezolid (ZYVOX) IV 600 mg (06/24/24 0508)    Antimicrobials: Anti-infectives (From admission, onward)    Start     Dose/Rate Route Frequency Ordered Stop   06/24/24 0600  linezolid (ZYVOX) IVPB 600 mg        600 mg 300 mL/hr over 60 Minutes Intravenous Every 12 hours 06/23/24 1935     06/24/24 0000  Ampicillin-Sulbactam (UNASYN) 3 g in sodium chloride  0.9 % 100 mL IVPB        3 g 200 mL/hr over 30 Minutes Intravenous Every 6 hours 06/23/24 2014     06/23/24 1800  Ampicillin-Sulbactam (UNASYN) 3 g in sodium chloride  0.9 % 100 mL IVPB       Placed in And Linked Group   3 g 200 mL/hr over 30 Minutes Intravenous  Once 06/23/24 1753 06/23/24 1832   06/23/24 1800  linezolid (ZYVOX) IVPB 600 mg       Placed in And Linked Group   600 mg 300 mL/hr over 60 Minutes Intravenous  Once 06/23/24 1753 06/23/24 2006       Objective: Vitals:   06/24/24 0728 06/24/24 0922  BP: (!) 166/83 (!) 162/89  Pulse: 63 64  Resp: 18   Temp: 97.9 F (36.6 C)   SpO2: 98%     Intake/Output Summary (Last 24 hours) at 06/24/2024 1143 Last data filed at 06/24/2024 0959 Gross per 24 hour  Intake 920.62 ml  Output 975 ml  Net -54.38 ml   Filed Weights   06/23/24 1236  Weight: 87.5 kg   Weight change:  Body mass index is 26.92 kg/m.   Physical Exam: General exam: Pleasant, elderly African-American male.  Not in distress Skin:  No rashes, lesions or ulcers. HEENT: Atraumatic, normocephalic, no obvious bleeding Lungs: Clear to auscultation bilaterally,  CVS: S1, S2, no murmur,   GI/Abd: Soft, nontender, nondistended, bowel sound present,   CNS: Alert, awake, oriented x 3 Psychiatry: Mood appropriate Extremities: No pedal edema, no calf tenderness, left great toe with ulcer  Data Review: I have personally reviewed the laboratory data and studies available.  F/u labs ordered Unresulted Labs (From admission, onward)    None       Signed, Chapman Rota, MD [facility] 06/24/2024

## 2024-06-24 NOTE — Progress Notes (Signed)
    Mr. Paino is a 73 year old male with osteomyelitis of the left great toe.  He underwent LLE angiogram in August of this year involving balloon angioplasty of the SFA, PT, and peroneal arteries due to osteomyelitis of the second toe.  Second toe was amputated and has since healed.  Duplex demonstrates a widely patent SFA.  He has also maintained two-vessel runoff via the peroneal and PT artery with presumably adequate toe pressure for wound healing.  No indication for repeating angiogram at this time.  Plans noted for great toe amputation tomorrow with Dr. Malvin.  He will continue his aspirin , Plavix , statin perioperatively.  Call vascular with questions.   Donnice Sender, PA-C [facility] [phone] 06/24/2024  5:53 PM

## 2024-06-24 NOTE — Progress Notes (Signed)
 Initial Nutrition Assessment  DOCUMENTATION CODES:   Not applicable  INTERVENTION:  Continue current diet  Encourage PO intake  Ensure Plus High Protein po daily each supplement provides 350 kcal and 20 grams of protein. -1 packet Juven BID, each packet provides 95 calories, 2.5 grams of protein (collagen), and 9.8 grams of carbohydrate (3 grams sugar); also contains 7 grams of L-arginine and L-glutamine, 300 mg vitamin C, 15 mg vitamin E, 1.2 mcg vitamin B-12, 9.5 mg zinc , 200 mg calcium , and 1.5 g  Calcium  Beta-hydroxy-Beta-methylbutyrate to support wound healing   NUTRITION DIAGNOSIS:   Increased nutrient needs related to wound healing as evidenced by  (diabtetic foot ulcer).   GOAL:   Patient will meet greater than or equal to 90% of their needs   MONITOR:   Supplement acceptance, PO intake  REASON FOR ASSESSMENT:   Consult Wound healing  ASSESSMENT:   PMHx significant for diabetes mellitus type 2, HLD, PAD, hepatitis C, hypertension, presented to the emergency department with worsening issues regarding his left great toe  Patient seen in room. Pt reports a decrease in appetite for the past few months, has not been eating breakfast due to not feeling hungry until 12 or 1 pm. Pt was changed from Trulicity  to ozempic  by PCP around May/June of this year for diabetes and weight management, likely causing decreased hunger. Pt reports actively trying to lose weight for several months, has been increasing amount of time he goes to the gym and started cutting out fast food. Discussed with patient need for increased protein for wound healing post op, will start ensure daily to supplement breakfast if patient is not hungary and juven BID.   Admit/current weight: 87.5 kg  Pt reports intentional weight loss by increasing physical activity and dietary changes.   Nutritionally Relevant Medications:  SSI 0-9 units q 4 hrs, SEMGLEE  15 units daily   Labs Reviewed: Glu 219, A1c 7.1  (11/5), CBG 141-226  NUTRITION - FOCUSED PHYSICAL EXAM:  Flowsheet Row Most Recent Value  Orbital Region No depletion  Upper Arm Region Mild depletion  Thoracic and Lumbar Region No depletion  Buccal Region No depletion  Temple Region Mild depletion  Clavicle Bone Region Mild depletion  Clavicle and Acromion Bone Region No depletion  Scapular Bone Region No depletion  Dorsal Hand Mild depletion  Patellar Region No depletion  Anterior Thigh Region No depletion  Posterior Calf Region No depletion  Hair Reviewed  Eyes Reviewed  Mouth Reviewed  Skin Reviewed  Nails Reviewed    Diet Order:   Diet Order             Diet NPO time specified Except for: Sips with Meds  Diet effective midnight           Diet heart healthy/carb modified Room service appropriate? Yes; Fluid consistency: Thin  Diet effective now                   EDUCATION NEEDS:   Education needs have been addressed  Skin:  Skin Assessment: Skin Integrity Issues: Skin Integrity Issues:: Other (Comment) Other: osteomyelitis  left foot  Last BM:  11/5  Height:   Ht Readings from Last 1 Encounters:  06/23/24 5' 11 (1.803 m)    Weight:   Wt Readings from Last 1 Encounters:  06/23/24 87.5 kg     BMI:  Body mass index is 26.92 kg/m.  Estimated Nutritional Needs:   Kcal:  2200-2600  Protein:  105-131 grams  Fluid:  >  2L/d  Madalyn Potters, MS, RD, LDN Clinical Dietitian  Contact via secure chat. If unavailable, use group chat RD Inpatient.

## 2024-06-25 ENCOUNTER — Encounter (HOSPITAL_COMMUNITY): Payer: Self-pay | Admitting: Internal Medicine

## 2024-06-25 ENCOUNTER — Inpatient Hospital Stay (HOSPITAL_COMMUNITY): Admitting: Certified Registered Nurse Anesthetist

## 2024-06-25 ENCOUNTER — Other Ambulatory Visit: Payer: Self-pay

## 2024-06-25 ENCOUNTER — Inpatient Hospital Stay (HOSPITAL_COMMUNITY)

## 2024-06-25 ENCOUNTER — Encounter (HOSPITAL_COMMUNITY): Admitting: Certified Registered Nurse Anesthetist

## 2024-06-25 ENCOUNTER — Encounter (HOSPITAL_COMMUNITY)
Admission: EM | Disposition: A | Discharge status: Home / Self Care | Payer: Self-pay | Source: Ambulatory Visit | Attending: Internal Medicine

## 2024-06-25 DIAGNOSIS — I10 Essential (primary) hypertension: Secondary | ICD-10-CM

## 2024-06-25 DIAGNOSIS — M869 Osteomyelitis, unspecified: Secondary | ICD-10-CM

## 2024-06-25 DIAGNOSIS — G4730 Sleep apnea, unspecified: Secondary | ICD-10-CM

## 2024-06-25 DIAGNOSIS — Z87891 Personal history of nicotine dependence: Secondary | ICD-10-CM

## 2024-06-25 HISTORY — PX: AMPUTATION TOE: SHX6595

## 2024-06-25 LAB — GLUCOSE, CAPILLARY
Glucose-Capillary: 137 mg/dL — ABNORMAL HIGH (ref 70–99)
Glucose-Capillary: 137 mg/dL — ABNORMAL HIGH (ref 70–99)
Glucose-Capillary: 142 mg/dL — ABNORMAL HIGH (ref 70–99)
Glucose-Capillary: 147 mg/dL — ABNORMAL HIGH (ref 70–99)
Glucose-Capillary: 180 mg/dL — ABNORMAL HIGH (ref 70–99)
Glucose-Capillary: 181 mg/dL — ABNORMAL HIGH (ref 70–99)
Glucose-Capillary: 247 mg/dL — ABNORMAL HIGH (ref 70–99)
Glucose-Capillary: 259 mg/dL — ABNORMAL HIGH (ref 70–99)

## 2024-06-25 SURGERY — AMPUTATION, TOE
Anesthesia: Monitor Anesthesia Care | Site: Toe | Laterality: Left

## 2024-06-25 MED ORDER — DEXAMETHASONE SOD PHOSPHATE PF 10 MG/ML IJ SOLN
INTRAMUSCULAR | Status: DC | PRN
  Administered 2024-06-25: 5 mg via INTRAVENOUS

## 2024-06-25 MED ORDER — LIDOCAINE 2% (20 MG/ML) 5 ML SYRINGE
INTRAMUSCULAR | Status: AC
  Filled 2024-06-25: qty 5

## 2024-06-25 MED ORDER — AMOXICILLIN-POT CLAVULANATE 875-125 MG PO TABS
1.0000 | ORAL_TABLET | Freq: Two times a day (BID) | ORAL | Status: DC
Start: 2024-06-25 — End: 2024-06-26
  Administered 2024-06-25 – 2024-06-26 (×3): 1 via ORAL
  Filled 2024-06-25 (×3): qty 1

## 2024-06-25 MED ORDER — CHLORHEXIDINE GLUCONATE 0.12 % MT SOLN
OROMUCOSAL | Status: AC
  Administered 2024-06-25: 15 mL via OROMUCOSAL
  Filled 2024-06-25: qty 15

## 2024-06-25 MED ORDER — ACETAMINOPHEN 500 MG PO TABS: 1000.0000 mg | ORAL_TABLET | Freq: Once | ORAL | Status: AC

## 2024-06-25 MED ORDER — CHLORHEXIDINE GLUCONATE 0.12 % MT SOLN: 15.0000 mL | Freq: Once | OROMUCOSAL | Status: AC

## 2024-06-25 MED ORDER — LACTATED RINGERS IV SOLN: INTRAVENOUS | Status: DC

## 2024-06-25 MED ORDER — PROPOFOL 10 MG/ML IV BOLUS
INTRAVENOUS | Status: DC | PRN
  Administered 2024-06-25: 100 ug/kg/min via INTRAVENOUS
  Administered 2024-06-25: 50 mg via INTRAVENOUS

## 2024-06-25 MED ORDER — ONDANSETRON HCL 4 MG/2ML IJ SOLN
INTRAMUSCULAR | Status: AC
Start: 2024-06-25 — End: 2024-06-25
  Filled 2024-06-25: qty 2

## 2024-06-25 MED ORDER — ACETAMINOPHEN 500 MG PO TABS
ORAL_TABLET | ORAL | Status: AC
  Administered 2024-06-25: 1000 mg via ORAL
  Filled 2024-06-25: qty 2

## 2024-06-25 MED ORDER — ONDANSETRON HCL 4 MG/2ML IJ SOLN: 4.0000 mg | Freq: Once | INTRAMUSCULAR | Status: DC | PRN

## 2024-06-25 MED ORDER — LIDOCAINE HCL 1 % IJ SOLN
INTRAMUSCULAR | Status: DC | PRN
  Administered 2024-06-25: 10 mL

## 2024-06-25 MED ORDER — FENTANYL CITRATE (PF) 100 MCG/2ML IJ SOLN: 25.0000 ug | INTRAMUSCULAR | Status: DC | PRN

## 2024-06-25 MED ORDER — BUPIVACAINE HCL (PF) 0.5 % IJ SOLN
INTRAMUSCULAR | Status: AC
  Filled 2024-06-25: qty 60

## 2024-06-25 MED ORDER — LIDOCAINE 2% (20 MG/ML) 5 ML SYRINGE
INTRAMUSCULAR | Status: DC | PRN
  Administered 2024-06-25: 60 mg via INTRAVENOUS

## 2024-06-25 MED ORDER — LIDOCAINE HCL 1 % IJ SOLN
INTRAMUSCULAR | Status: AC
Start: 2024-06-25 — End: 2024-06-25
  Filled 2024-06-25: qty 40

## 2024-06-25 MED ORDER — ORAL CARE MOUTH RINSE: 15.0000 mL | Freq: Once | OROMUCOSAL | Status: AC

## 2024-06-25 MED ORDER — SODIUM CHLORIDE 0.9 % IR SOLN
Status: DC | PRN
Start: 2024-06-25 — End: 2024-06-25
  Administered 2024-06-25: 1000 mL

## 2024-06-25 SURGICAL SUPPLY — 36 items
BLADE AVERAGE 25X9 (BLADE) IMPLANT
BLADE SURG 10 STRL SS (BLADE) ×1 IMPLANT
BLADE SURG 15 STRL LF DISP TIS (BLADE) ×1 IMPLANT
BNDG COHESIVE 3X5 TAN ST LF (GAUZE/BANDAGES/DRESSINGS) ×1 IMPLANT
BNDG COMPR ESMARK 4X3 LF (GAUZE/BANDAGES/DRESSINGS) ×1 IMPLANT
BNDG ELASTIC 3INX 5YD STR LF (GAUZE/BANDAGES/DRESSINGS) ×1 IMPLANT
BNDG ELASTIC 4INX 5YD STR LF (GAUZE/BANDAGES/DRESSINGS) IMPLANT
BNDG GAUZE DERMACEA FLUFF 4 (GAUZE/BANDAGES/DRESSINGS) IMPLANT
CHLORAPREP W/TINT 26 (MISCELLANEOUS) IMPLANT
DRSG ADAPTIC 3X8 NADH LF (GAUZE/BANDAGES/DRESSINGS) IMPLANT
DRSG XEROFORM 1X8 (GAUZE/BANDAGES/DRESSINGS) IMPLANT
ELECTRODE REM PT RTRN 9FT ADLT (ELECTROSURGICAL) ×1 IMPLANT
GAUZE PAD ABD 8X10 STRL (GAUZE/BANDAGES/DRESSINGS) IMPLANT
GAUZE SPONGE 2X2 STRL 8-PLY (GAUZE/BANDAGES/DRESSINGS) IMPLANT
GAUZE SPONGE 4X4 12PLY STRL (GAUZE/BANDAGES/DRESSINGS) ×1 IMPLANT
GAUZE STRETCH 2X75IN STRL (MISCELLANEOUS) ×1 IMPLANT
GAUZE XEROFORM 1X8 LF (GAUZE/BANDAGES/DRESSINGS) ×1 IMPLANT
GLOVE BIO SURGEON STRL SZ7.5 (GLOVE) ×1 IMPLANT
GLOVE BIOGEL PI IND STRL 7.5 (GLOVE) ×1 IMPLANT
GOWN STRL REUS W/ TWL LRG LVL3 (GOWN DISPOSABLE) ×2 IMPLANT
KIT BASIN OR (CUSTOM PROCEDURE TRAY) ×1 IMPLANT
NDL HYPO 25X1 1.5 SAFETY (NEEDLE) ×1 IMPLANT
NEEDLE HYPO 25X1 1.5 SAFETY (NEEDLE) ×1 IMPLANT
PACK ORTHO EXTREMITY (CUSTOM PROCEDURE TRAY) ×1 IMPLANT
PADDING CAST ABS COTTON 4X4 ST (CAST SUPPLIES) ×2 IMPLANT
SET HNDPC FAN SPRY TIP SCT (DISPOSABLE) IMPLANT
SOLN STERILE WATER BTL 1000 ML (IV SOLUTION) ×1 IMPLANT
SPIKE FLUID TRANSFER (MISCELLANEOUS) IMPLANT
STOCKINETTE 4X48 STRL (DRAPES) IMPLANT
SUT ETHILON 3 0 FSLX (SUTURE) IMPLANT
SUT PROLENE 3 0 PS 2 (SUTURE) IMPLANT
SUT PROLENE 4 0 PS 2 18 (SUTURE) IMPLANT
SYR CONTROL 10ML LL (SYRINGE) ×1 IMPLANT
TUBE CONNECTING 12X1/4 (SUCTIONS) IMPLANT
UNDERPAD 30X36 HEAVY ABSORB (UNDERPADS AND DIAPERS) ×1 IMPLANT
YANKAUER SUCT BULB TIP NO VENT (SUCTIONS) IMPLANT

## 2024-06-25 NOTE — Progress Notes (Signed)
 Orthopedic Tech Progress Note Patient Details:  Timothy Peters 1951-05-14 [id number]  Ortho Devices Type of Ortho Device: Postop shoe/boot Ortho Device/Splint Location: LLE Ortho Device/Splint Interventions: Ordered, Application, Adjustment   Fitted Patient for Shoe & Left at Bedside. Post Interventions Patient Tolerated: Well   [name] Eric Nees 06/25/2024, 2:56 PM

## 2024-06-25 NOTE — Plan of Care (Signed)
 Pt NPO since midnight. Vitals stable overnight. Pt informed he must be in nothing but underwear when he goes to the OR. All belongings can be safely and securely stored in his room while he is off the unit. Pt verbalizes understanding and agreement.   Problem: Education: Goal: Ability to describe self-care measures that may prevent or decrease complications (Diabetes Survival Skills Education) will improve Outcome: Progressing Goal: Individualized Educational Video(s) Outcome: Progressing   Problem: Coping: Goal: Ability to adjust to condition or change in health will improve Outcome: Progressing   Problem: Fluid Volume: Goal: Ability to maintain a balanced intake and output will improve Outcome: Progressing   Problem: Health Behavior/Discharge Planning: Goal: Ability to identify and utilize available resources and services will improve Outcome: Progressing Goal: Ability to manage health-related needs will improve Outcome: Progressing   Problem: Metabolic: Goal: Ability to maintain appropriate glucose levels will improve Outcome: Progressing   Problem: Nutritional: Goal: Maintenance of adequate nutrition will improve Outcome: Progressing Goal: Progress toward achieving an optimal weight will improve Outcome: Progressing   Problem: Skin Integrity: Goal: Risk for impaired skin integrity will decrease Outcome: Progressing   Problem: Tissue Perfusion: Goal: Adequacy of tissue perfusion will improve Outcome: Progressing   Problem: Education: Goal: Knowledge of General Education information will improve Description: Including pain rating scale, medication(s)/side effects and non-pharmacologic comfort measures Outcome: Progressing   Problem: Health Behavior/Discharge Planning: Goal: Ability to manage health-related needs will improve Outcome: Progressing   Problem: Clinical Measurements: Goal: Ability to maintain clinical measurements within normal limits will  improve Outcome: Progressing Goal: Will remain free from infection Outcome: Progressing Goal: Diagnostic test results will improve Outcome: Progressing Goal: Respiratory complications will improve Outcome: Progressing Goal: Cardiovascular complication will be avoided Outcome: Progressing   Problem: Activity: Goal: Risk for activity intolerance will decrease Outcome: Progressing   Problem: Nutrition: Goal: Adequate nutrition will be maintained Outcome: Progressing   Problem: Coping: Goal: Level of anxiety will decrease Outcome: Progressing   Problem: Elimination: Goal: Will not experience complications related to bowel motility Outcome: Progressing Goal: Will not experience complications related to urinary retention Outcome: Progressing   Problem: Pain Managment: Goal: General experience of comfort will improve and/or be controlled Outcome: Progressing   Problem: Safety: Goal: Ability to remain free from injury will improve Outcome: Progressing   Problem: Skin Integrity: Goal: Risk for impaired skin integrity will decrease Outcome: Progressing

## 2024-06-25 NOTE — Plan of Care (Signed)

## 2024-06-25 NOTE — Transfer of Care (Signed)
 Immediate Anesthesia Transfer of Care Note  Patient: Timothy Peters  Procedure(s) Performed: AMPUTATION, TOE (Left: Toe)  Patient Location: PACU  Anesthesia Type:MAC  Level of Consciousness: awake, alert , and oriented  Airway & Oxygen Therapy: Patient Spontanous Breathing and Patient connected to face mask oxygen  Post-op Assessment: Report given to RN and Post -op Vital signs reviewed and stable  Post vital signs: Reviewed and stable  Last Vitals:  Vitals Value Taken Time  BP 134/78   Temp    Pulse 60 06/25/24 12:17  Resp 11 06/25/24 12:17  SpO2 100 % 06/25/24 12:17  Vitals shown include unfiled device data.  Last Pain:  Vitals:   06/25/24 0922  TempSrc:   PainSc: 3       Patients Stated Pain Goal: 0 (06/24/24 1317)  Complications: No notable events documented.

## 2024-06-25 NOTE — Op Note (Signed)
 Full Operative Report  Date of Operation: 11:29 AM, 06/25/2024   Patient: Timothy Peters - 73 y.o. male  Surgeon: Malvin Marsa [facility]CON, DPM   Assistant: None  Diagnosis: Osteomyelitis left great toe  Procedure:  1. Amputation of left great toe at mid proximal phalanx level    Anesthesia: Monitor Anesthesia Care  Corinne Garnette BRAVO, MD  Anesthesiologist: Corinne Garnette BRAVO, MD CRNA: Dorma Jonny BROCKS, CRNA   Estimated Blood Loss: Minimal   Hemostasis: 1) Anatomical dissection, mechanical compression, electrocautery 2) no tourniquet was used during procedure   Implants: * No implants in log *  Materials: prolene 3-0  Injectables: 1) Pre-operatively: 10 cc of 50:50 mixture 1%lidocaine  plain and 0.5% marcaine  plain 2) Post-operatively: None   Specimens: - Pathology: Left great toe  - Microbiology: culture L great toe   Antibiotics: IV antibiotics given per schedule on the floor  Drains: None  Complications: Patient tolerated the procedure well without complication.   Operative findings: As below in detailed report  Indications for Procedure: Timothy Peters presents to [facility], [place] with a chief complaint of chronic wound and osteomyelitis of the distal phalanx left great toe. The patient has failed conservative treatments of various modalities. At this time the patient has elected to proceed with surgical correction. All alternatives, risks, and complications of the procedures were thoroughly explained to the patient. Patient exhibits appropriate understanding of all discussion points and informed consent was signed and obtained in the chart with no guarantees to surgical outcome given or implied.  Description of Procedure: Patient was brought to the operating room. Patient remained on their hospital bed in the supine position. A surgical timeout was performed and all members of the operating room, the procedure, and the surgical site were identified.  anesthesia occurred as per anesthesia record. Local anesthetic as previously described was then injected about the operative field in a local infiltrative block.  The operative lower extremity as noted above was then prepped and draped in the usual sterile manner. The following procedure then began.  Attention was directed to the first digit on the LEFT foot. A full-thickness incision encompassing the entire digit was made using a #15 blade. Dissection was carried down to bone. The toe was secured with a towel clamp, further dissected in its entirety, and disarticulated at the IPJ and passed to the back table as a gross specimen. This was then labled and sent to pathology. The bone was noted to be soft and eroded, and consistent with osteomyelitis. A culture was harvested from the wound area with rongeur. A sagittal saw was then used to make transverse osteotomy of the proximal phalanx neck. All remaining necrotic and devitalized soft tissue structures were visualized and dissected away using sharp and dull dissection. Care was taken to protect all neurovascular structures throughout the dissection. All bleeders were cauterized as necessary. The area was then flushed with copious amounts of sterile saline. Then using the suture materials previously described, the site was closed in anatomic layers and the skin was well approximated under minimal tension.   The surgical site was then dressed with xeroform 4x4 kerlix and ace wrap. The patient tolerated both the procedure and anesthesia well with vital signs stable throughout. The patient was transferred in good condition and all vital signs stable  from the OR to recovery under the discretion of anesthesia.  Condition: Vital signs stable, neurovascular status unchanged from preoperative   Surgical plan:  Expect clean margin, recommend 5 days augmentin .  WBAT in post op shoe. Stable for dc tmrw from my standpoint. Follow up in office next week Thursday. Office  to call. Leave dressing clean dry intact until then  The patient will be WBAT in a post op shoe to the operative limb until further instructed. The dressing is to remain clean, dry, and intact. Will continue to follow unless noted elsewhere.   Marsa Honour, DPM [facility]

## 2024-06-25 NOTE — Progress Notes (Signed)
 History and Physical Interval Note:  06/25/2024 9:49 AM  Timothy Peters  has presented today for surgery, with the diagnosis of osteomyelitis left great toe.  The various methods of treatment have been discussed with the patient and family. After consideration of risks, benefits and other options for treatment, the patient has consented to   Procedure(s) with comments: AMPUTATION, TOE (Left) - Partial amputation L great toe as a surgical intervention.  The patient's history has been reviewed, patient examined, no change in status, stable for surgery.  I have reviewed the patient's chart and labs.  Questions were answered to the patient's satisfaction.     Marsa FALCON Mishon Blubaugh

## 2024-06-25 NOTE — Anesthesia Preprocedure Evaluation (Addendum)
 Anesthesia Evaluation  Patient identified by MRN, date of birth, ID band Patient awake    Reviewed: Allergy & Precautions, NPO status , Patient's Chart, lab work & pertinent test results, reviewed documented beta blocker date and time   Airway Mallampati: II  TM Distance: >3 FB Neck ROM: Full    Dental  (+) Dental Advisory Given, Upper Dentures, Lower Dentures, Edentulous Upper, Edentulous Lower   Pulmonary sleep apnea , former smoker   Pulmonary exam normal breath sounds clear to auscultation       Cardiovascular hypertension, Pt. on home beta blockers and Pt. on medications + Peripheral Vascular Disease  Normal cardiovascular exam Rhythm:Regular Rate:Normal     Neuro/Psych  Neuromuscular disease  negative psych ROS   GI/Hepatic hiatal hernia,,,(+) Hepatitis -, C  Endo/Other  diabetes, Type 2, Oral Hypoglycemic Agents    Renal/GU negative Renal ROS     Musculoskeletal Osteomyelitis left great toe   Abdominal   Peds  Hematology  (+) Blood dyscrasia, anemia   Anesthesia Other Findings Day of surgery medications reviewed with the patient.  Reproductive/Obstetrics                              Anesthesia Physical Anesthesia Plan  ASA: 3  Anesthesia Plan: MAC   Post-op Pain Management: Tylenol  PO (pre-op)* and Gabapentin  PO (pre-op)*   Induction: Intravenous  PONV Risk Score and Plan: 1 and TIVA and Treatment may vary due to age or medical condition  Airway Management Planned: Natural Airway and Simple Face Mask  Additional Equipment:   Intra-op Plan:   Post-operative Plan:   Informed Consent: I have reviewed the patients History and Physical, chart, labs and discussed the procedure including the risks, benefits and alternatives for the proposed anesthesia with the patient or authorized representative who has indicated his/her understanding and acceptance.     Dental advisory  given  Plan Discussed with: CRNA and Anesthesiologist  Anesthesia Plan Comments:          Anesthesia Quick Evaluation

## 2024-06-25 NOTE — Progress Notes (Signed)
 Unable to reach anyone on 2W for report at this time.

## 2024-06-26 ENCOUNTER — Other Ambulatory Visit (HOSPITAL_COMMUNITY): Payer: Self-pay

## 2024-06-26 ENCOUNTER — Other Ambulatory Visit: Payer: Self-pay

## 2024-06-26 DIAGNOSIS — M869 Osteomyelitis, unspecified: Secondary | ICD-10-CM | POA: Diagnosis not present

## 2024-06-26 LAB — GLUCOSE, CAPILLARY
Glucose-Capillary: 158 mg/dL — ABNORMAL HIGH (ref 70–99)
Glucose-Capillary: 184 mg/dL — ABNORMAL HIGH (ref 70–99)

## 2024-06-26 MED ORDER — DAPAGLIFLOZIN PROPANEDIOL 10 MG PO TABS
10.0000 mg | ORAL_TABLET | Freq: Every day | ORAL | 0 refills | Status: AC
  Filled 2024-06-26: qty 30, 30d supply, fill #0

## 2024-06-26 MED ORDER — AMOXICILLIN-POT CLAVULANATE 875-125 MG PO TABS
1.0000 | ORAL_TABLET | Freq: Two times a day (BID) | ORAL | 0 refills | Status: AC
Start: 2024-06-26 — End: 2024-06-30
  Filled 2024-06-26 (×2): qty 8, 4d supply, fill #0

## 2024-06-26 MED ORDER — METOPROLOL SUCCINATE ER 25 MG PO TB24
25.0000 mg | ORAL_TABLET | Freq: Every day | ORAL | 5 refills | Status: AC
  Filled 2024-06-26 – 2024-09-17 (×2): qty 90, 90d supply, fill #0

## 2024-06-26 MED ORDER — HYDROCODONE-ACETAMINOPHEN 5-325 MG PO TABS
1.0000 | ORAL_TABLET | ORAL | 0 refills | Status: AC | PRN
Start: 2024-06-26 — End: ?

## 2024-06-26 NOTE — Discharge Summary (Signed)
 Physician Discharge Summary   Patient: Timothy Peters MRN: [id number] DOB: 08/15/1951  Admit date:     06/23/2024  Discharge date: 06/26/24  Discharge Physician: Landon BRAVO Algie Cales   PCP: Ransom Other, MD   Recommendations at discharge:   Follow up in podiatry office next week Thursday. Office to call. Leave dressing clean dry intact until then    Discharge Diagnoses: Principal Problem:   Osteomyelitis of great toe of left foot (HCC) Active Problems:   HTN (hypertension)   Hyperlipidemia   Diabetic peripheral neuropathy (HCC)   Type 2 diabetes mellitus (HCC)   Acute osteomyelitis of toe, left (HCC)   PAD (peripheral artery disease)  Resolved Problems:   * No resolved hospital problems. *  Hospital Course: Timothy Peters is a 73 y.o. male with PMH significant for DM2, HTN, HLD, PAD, diabetic foot ulcers, hiatal hernia, spinal stenosis, treated hep C. Lives at home with wife.  Uses a cane to ambulate.   In August 2025, patient underwent drug-coated balloon angioplasty of the left SFA and angioplasty of the posterior tibial and peroneal artery.  He then underwent left second toe amputation podiatry. He has noticed discoloration of his left great toe over the past several months with recent swelling, and foul-smelling drainage 11/4, he was seen at podiatry office underwent debridement.  With the concern of osteomyelitis, patient was referred to ED.   In the ED, afebrile, heart rate in 70s, blood pressure 170s, breathing on room air. Initial labs with WC count 7.2, lactic acid 1.1, CMP unremarkable MRI left foot showed 1. Active osteomyelitis in the distal phalanx of the great toe. 2. Transverse band of low T1 and high T2 signal in the proximal metaphysis of the proximal phalanx of the small toe, favoring fracture over infection. 3. Nonspecific subcutaneous edema along the dorsum of the foot tracking into the toes, consistent with cellulitis. No drainable abscess identified. 4.  Mild degenerative changes in the head of the first metatarsal. 5. 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   Patient was started on broad-spectrum IV antibiotics Admitted to [facility]  Assessment and Plan: Acute osteomyelitis of left great toe  MRI findings as above Podiatry and vascular consulted Vascular surgery no indication for recent angiogram patient had left lower extremity angiogram in August 2025 involving balloon angioplasty of the SFA, PT and peroneal arteries due to osteomyelitis of the second toe.  Second toe was amputated and has since healed.  Duplex demonstrated a widely patent SFA.  Surgery performed amputation of the left great toe at mid proximal phalanx level on 06/25/2024 Initially managed on broad-spectrum IV antibiotics with IV Unasyn and IV Zyvox Patient to be discharged to complete 5-day course of Augmentin  Per podiatry patient to be WBAT in a postop shoe to the operative limb until further instructed.  Dressing is to remain clean dry and intact. No fever.  WBC count normal Pain regimen on PRN Tylenol , Norco, PAD S/p stent HLD In August 2025, patient underwent drug-coated balloon angioplasty of the left SFA and angioplasty of the posterior tibial and peroneal artery.  Seen by vascular surgery Dr. Serene. PTA meds- aspirin  81 mg daily, Plavix  75 mg daily, Lipitor  80 mg daily Continue all   Type 2 diabetes mellitus with hyperglycemia Diabetic neuropathy A1c 7.1 on 05/24/2023 PTA meds-Tresiba  28 units daily, metformin  1000 mg twice daily glipizide  PRN Currently on Semglee  15 units daily, SSI/Accu-Cheks Continue Neurontin  300 mg 4 times daily Last Labs  HTN (hypertension) PTA meds- Toprol  25 mg daily, losartan  100 mg daily Resume both   Anxiety/depression Continue Cymbalta  60 mg daily   Mobility: Uses a cane to ambulate.  May need PT postprocedure   Goals of care   Code Status: Full Code         Consultants: Podiatry and  vascular surgery Procedures performed:  1. Amputation of left great toe at mid proximal phalanx level  Disposition: Home Diet recommendation:  Carb modified diet DISCHARGE MEDICATION: Allergies as of 06/26/2024   No Known Allergies      Medication List     STOP taking these medications    doxycycline  100 MG tablet Commonly known as: VIBRA -TABS   traMADol  50 MG tablet Commonly known as: ULTRAM        TAKE these medications    Accu-Chek Guide w/Device Kit Use as directed up to 4 times daily   Accu-Chek Softclix Lancets lancets Test up to 4 times a day as directed   amoxicillin -clavulanate 875-125 MG tablet Commonly known as: AUGMENTIN  Take 1 tablet by mouth 2 (two) times daily for 4 days.   aspirin  81 MG tablet Take 1 tablet (81 mg total) by mouth daily. Resume 4 days post-op What changed: additional instructions   atorvastatin  80 MG tablet Commonly known as: LIPITOR  Take 1 tablet (80 mg total) by mouth daily.   B-D UF III MINI PEN NEEDLES 31G X 5 MM Misc Generic drug: Insulin  Pen Needle Use with lantus  daily.   BD Pen Needle Micro U/F 32G X 6 MM Misc Generic drug: Insulin  Pen Needle Use as directed   clopidogrel  75 MG tablet Commonly known as: PLAVIX  Take 1 tablet (75 mg total) by mouth daily.   dapagliflozin  propanediol 10 MG Tabs tablet Commonly known as: FARXIGA  Take 1 tablet (10 mg total) by mouth daily.   Dexcom G7 Sensor Misc [place]  as directed to continuously monitor blood glucose, replacing sensor every 10 days   DULoxetine  60 MG capsule Commonly known as: CYMBALTA  Take 1 capsule (60 mg total) by mouth daily.   gabapentin  300 MG capsule Commonly known as: NEURONTIN  Take 1 capsule (300 mg total) by mouth 4 (four) times daily.   glipiZIDE  5 MG tablet Commonly known as: GLUCOTROL  Take 1 tablet (5 mg total) by mouth daily if blood sugar is >150 mg/dL.   HYDROcodone -acetaminophen  5-325 MG tablet Commonly known as: NORCO/VICODIN Take 1-2  tablets by mouth every 4 (four) hours as needed for moderate pain (pain score 4-6).   ibuprofen  200 MG tablet Commonly known as: ADVIL  Take 400 mg by mouth daily as needed for moderate pain (pain score 4-6).   IRON PO Take 1 tablet by mouth daily.   losartan  100 MG tablet Commonly known as: COZAAR  Take 1 tablet (100 mg total) by mouth daily.   metFORMIN  1000 MG tablet Commonly known as: GLUCOPHAGE  Take 1 tablet (1,000 mg total) by mouth 2 (two) times daily with a meal.   metoprolol  succinate 25 MG 24 hr tablet Commonly known as: TOPROL -XL Take 1 tablet (25 mg total) by mouth daily.   potassium chloride  SA 20 MEQ tablet Commonly known as: KLOR-CON  M Take 1 tablet (20 mEq total) by mouth daily.   Tresiba  FlexTouch 100 UNIT/ML FlexTouch Pen Generic drug: insulin  degludec Inject 28 Units into the skin daily.        Discharge Exam: Filed Weights   06/23/24 1236 06/25/24 0916  Weight: 87.5 kg 87.5 kg   General exam: Pleasant, elderly African-American male.  Not in distress Skin: No rashes, lesions or ulcers. HEENT: Atraumatic, normocephalic, no obvious bleeding Lungs: Clear to auscultation bilaterally,  CVS: S1, S2, no murmur,   GI/Abd: Soft, nontender, nondistended, bowel sound present,   CNS: Alert, awake, oriented x 3 Psychiatry: Mood appropriate Extremities: Right foot wrapped Condition at discharge: stable  The results of significant diagnostics from this hospitalization (including imaging, microbiology, ancillary and laboratory) are listed below for reference.   Imaging Studies: DG Foot 2 Views Left Result Date: 06/25/2024 EXAM: 2 VIEW(S) XRAY OF THE LEFT FOOT 06/25/2024 01:03:00 PM COMPARISON: 03/25/2024 CLINICAL HISTORY: Post-operative state. FINDINGS: BONES AND JOINTS: Interval amputation of the first digit at the level of the Proximal first metatarsal diaphysis. Prior second digit amputation at the level of the Distal second proximal phalanx. No joint  dislocation. SOFT TISSUES: Mild soft tissue swelling. Vascular calcifications. Retained BBs project over the Posterior ankle and heel. IMPRESSION: 1. Interval amputation of the first digit at the level of the proximal first metatarsal diaphysis. Electronically signed by: Norman Gatlin MD 06/25/2024 08:42 PM EST RP Workstation: [id number]   VAS US  LOWER EXTREMITY ARTERIAL DUPLEX Result Date: 06/25/2024 LOWER EXTREMITY ARTERIAL DUPLEX STUDY Patient Name:  Timothy Peters  Date of Exam:   06/24/2024 Medical Rec #: [id number]         Accession #:    [id number] Date of Birth: 04/25/1951         Patient Gender: M Patient Age:   27 years Exam Location:  [facility] Procedure:      VAS US  LOWER EXTREMITY ARTERIAL DUPLEX Referring Phys: GAILE NEW --------------------------------------------------------------------------------  Indications: Ulceration, and peripheral artery disease. High Risk Factors: Hypertension, hyperlipidemia, Diabetes, past history of                    smoking.  Vascular Interventions: Amputation of the 2nd digit of the left lower extremity                         on 6.1.2025. Current ABI:            Right: 1.06                         Left 1.00 Comparison Study: previous study on 9.11.2025. Performing Technologist: Edilia Elden Appl  Examination Guidelines: A complete evaluation includes B-mode imaging, spectral Doppler, color Doppler, and power Doppler as needed of all accessible portions of each vessel. Bilateral testing is considered an integral part of a complete examination. Limited examinations for reoccurring indications may be performed as noted.   +-----------+--------+-----+---------------+----------+--------+ LEFT       PSV cm/sRatioStenosis       Waveform  Comments +-----------+--------+-----+---------------+----------+--------+ CFA Prox   114                         biphasic           +-----------+--------+-----+---------------+----------+--------+ CFA Distal  126                         triphasic          +-----------+--------+-----+---------------+----------+--------+ DFA        122                         biphasic           +-----------+--------+-----+---------------+----------+--------+ SFA Prox  91                          triphasic          +-----------+--------+-----+---------------+----------+--------+ SFA Mid    121                         triphasic          +-----------+--------+-----+---------------+----------+--------+ SFA Distal 98                          triphasic          +-----------+--------+-----+---------------+----------+--------+ POP Prox   111                         triphasic          +-----------+--------+-----+---------------+----------+--------+ POP Distal 74                          triphasic          +-----------+--------+-----+---------------+----------+--------+ TP Trunk   232          50-74% stenosistriphasic          +-----------+--------+-----+---------------+----------+--------+ ATA Prox   128                         triphasic          +-----------+--------+-----+---------------+----------+--------+ ATA Mid    60                          triphasic          +-----------+--------+-----+---------------+----------+--------+ ATA Distal 19                          monophasic         +-----------+--------+-----+---------------+----------+--------+ PTA Prox   230                         monophasic         +-----------+--------+-----+---------------+----------+--------+ PTA Mid    115                         monophasic         +-----------+--------+-----+---------------+----------+--------+ PTA Distal 185                         monophasic         +-----------+--------+-----+---------------+----------+--------+ PERO Prox  80                          triphasic          +-----------+--------+-----+---------------+----------+--------+ PERO Mid    105                         triphasic          +-----------+--------+-----+---------------+----------+--------+ PERO Distal75                          biphasic           +-----------+--------+-----+---------------+----------+--------+ DP         21  monophasic         +-----------+--------+-----+---------------+----------+--------+ Decreased flow in distal anterior tibial artery. Elevated velocities in the tibioperoneal trunk still observed. Elevated velocities also noted in the proximal posterior tibial artery.  Summary: Left: Decreased flow noted in the left ATA, elevated velocity in TP trunk still noted and observed in the proximal posterior tibial artery.  See table(s) above for measurements and observations. Electronically signed by Lonni Gaskins MD on 06/25/2024 at 9:35:17 AM.    Final    VAS US  ABI WITH/WO TBI Result Date: 06/24/2024  LOWER EXTREMITY DOPPLER STUDY Patient Name:  Timothy Peters  Date of Exam:   06/24/2024 Medical Rec #: [id number]         Accession #:    [id number] Date of Birth: 13-Jul-1951         Patient Gender: M Patient Age:   41 years Exam Location:  [facility] Procedure:      VAS US  ABI WITH/WO TBI Referring Phys: GAILE NEW --------------------------------------------------------------------------------  Indications: Ulceration, and peripheral artery disease. High Risk         Hypertension, hyperlipidemia, Diabetes, past history of Factors:          smoking.  Vascular Interventions: Amputation of the 2nd digit of the left foot on                         6.7.2025. Comparison Study: Previous study on 9.11.2025. Performing Technologist: Edilia Elden Appl  Examination Guidelines: A complete evaluation includes at minimum, Doppler waveform signals and systolic blood pressure reading at the level of bilateral brachial, anterior tibial, and posterior tibial arteries, when vessel segments are accessible. Bilateral testing is  considered an integral part of a complete examination. Photoelectric Plethysmograph (PPG) waveforms and toe systolic pressure readings are included as required and additional duplex testing as needed. Limited examinations for reoccurring indications may be performed as noted.  ABI Findings: +---------+------------------+-----+-----------+--------+ Right    Rt Pressure (mmHg)IndexWaveform   Comment  +---------+------------------+-----+-----------+--------+ Brachial 186                    triphasic           +---------+------------------+-----+-----------+--------+ PTA      198               1.06 multiphasic         +---------+------------------+-----+-----------+--------+ DP       187               1.01 monophasic          +---------+------------------+-----+-----------+--------+ Great Toe112               0.60 Normal              +---------+------------------+-----+-----------+--------+ +---------+------------------+-----+-----------+-------+ Left     Lt Pressure (mmHg)IndexWaveform   Comment +---------+------------------+-----+-----------+-------+ Brachial 177                    triphasic          +---------+------------------+-----+-----------+-------+ PTA      186               1.00 multiphasic        +---------+------------------+-----+-----------+-------+ DP       185               0.99 monophasic         +---------+------------------+-----+-----------+-------+ Burnetta Quaker  0.63 Normal             +---------+------------------+-----+-----------+-------+ +-------+-----------+-----------+------------+------------+ ABI/TBIToday's ABIToday's TBIPrevious ABIPrevious TBI +-------+-----------+-----------+------------+------------+ Right  1.06       0.60                                +-------+-----------+-----------+------------+------------+ Left   1.00       0.63                                 +-------+-----------+-----------+------------+------------+   Summary: Right: Resting right ankle-brachial index is within normal range. The right toe-brachial index is abnormal. Right toe pressure is >60 mmHg which suggests adequate perfusion for healing. Left: Resting left ankle-brachial index is within normal range. The left toe-brachial index is abnormal. Left toe pressure is >60 mmHg which suggests adequate perfusion for healing. *See table(s) above for measurements and observations.  Electronically signed by Norman Serve on 06/24/2024 at 7:03:28 PM.    Final    MR FOOT LEFT W WO CONTRAST Result Date: 06/23/2024 EXAM: MRI of the left Foot without and with contrast. 06/23/2024 04:52:00 PM TECHNIQUE: Multiplanar multisequence MRI of the left foot was performed without and with the administration of 8.5 mL gadobutrol (GADAVIST) 1 MMOL/ML intravenous contrast. COMPARISON: 06/22/2024 CLINICAL HISTORY: Soft tissue infection, suspicion for great toe osteomyelitis. FINDINGS: LISFRANC JOINT: Degenerative arthropathy at the Lisfranc joint including spurring. BONE MARROW: Prior amputation of the second toe at the distal metaphysis proximal phalangeal level. Diffuse edema and enhancement in the distal phalanx great toe compatible with active osteomyelitis as on image 13 series 8. Transverse band of low T1 and high T2 signal in the proximal metaphysis of the proximal phalanx small toe favoring fracture over infection. GREATER AND LESSER MTP JOINTS: Mild degenerative findings in the head of the 1st metatarsal. No significant joint effusion or osseous erosions. Normal alignment. SOFT TISSUES: Nonspecific subcutaneous edema along the dorsum of the foot tracking into the toes, cellulitis is a distinct possibility. No drainable abscess identified. TENDONS: Visualized flexor and extensor tendons are intact without tenosynovitis. IMPRESSION: 1. Active osteomyelitis in the distal phalanx of the great toe. 2. Transverse band of  low T1 and high T2 signal in the proximal metaphysis of the proximal phalanx of the small toe, favoring fracture over infection. 3. Nonspecific subcutaneous edema along the dorsum of the foot tracking into the toes, consistent with cellulitis. No drainable abscess identified. 4. Mild degenerative changes in the head of the first metatarsal. 5. 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 Electronically signed by: Ryan Salvage MD 06/23/2024 05:00 PM EST RP Workstation: [id number]   DG Chest 2 View Result Date: 06/23/2024 EXAM: 2 VIEW(S) XRAY OF THE CHEST 06/23/2024 01:00:00 PM COMPARISON: 09/13/2022 CLINICAL HISTORY: Infection FINDINGS: LUNGS AND PLEURA: No focal pulmonary opacity. No pulmonary edema. No pleural effusion. No pneumothorax. HEART AND MEDIASTINUM: No acute abnormality of the cardiac and mediastinal silhouettes. BONES AND SOFT TISSUES: Thoracic degenerative spurring. No acute osseous abnormality. IMPRESSION: 1. No acute cardiopulmonary process. 2. Thoracic degenerative spurring is present. Electronically signed by: Ryan Salvage MD 06/23/2024 01:45 PM EST RP Workstation: [id number]   DG Foot Complete Left Result Date: 06/22/2024 Please see detailed radiograph report in office note.   Microbiology: Results for orders placed or performed during the hospital encounter of 06/23/24  Resp panel by RT-PCR (RSV, Flu A&B, Covid) Anterior Nasal Swab  Status: None   Collection Time: 06/23/24 12:47 PM   Specimen: Anterior Nasal Swab  Result Value Ref Range Status   SARS Coronavirus 2 by RT PCR NEGATIVE NEGATIVE Final   Influenza A by PCR NEGATIVE NEGATIVE Final   Influenza B by PCR NEGATIVE NEGATIVE Final    Comment: (NOTE) The Xpert Xpress SARS-CoV-2/FLU/RSV plus assay is intended as an aid in the diagnosis of influenza from Nasopharyngeal swab specimens and should not be used as a sole basis for treatment. Nasal washings and aspirates are unacceptable for  Xpert Xpress SARS-CoV-2/FLU/RSV testing.  Fact Sheet for Patients: bloggercourse.com  Fact Sheet for Healthcare Providers: seriousbroker.it  This test is not yet approved or cleared by the [place]  FDA and has been authorized for detection and/or diagnosis of SARS-CoV-2 by FDA under an Emergency Use Authorization (EUA). This EUA will remain in effect (meaning this test can be used) for the duration of the COVID-19 declaration under Section 564(b)(1) of the Act, 21 U.S.C. section 360bbb-3(b)(1), unless the authorization is terminated or revoked.     Resp Syncytial Virus by PCR NEGATIVE NEGATIVE Final    Comment: (NOTE) Fact Sheet for Patients: bloggercourse.com  Fact Sheet for Healthcare Providers: seriousbroker.it  This test is not yet approved or cleared by the [place]  FDA and has been authorized for detection and/or diagnosis of SARS-CoV-2 by FDA under an Emergency Use Authorization (EUA). This EUA will remain in effect (meaning this test can be used) for the duration of the COVID-19 declaration under Section 564(b)(1) of the Act, 21 U.S.C. section 360bbb-3(b)(1), unless the authorization is terminated or revoked.  Performed at [facility], [address], [place], [place] [address]   MRSA Next Gen by PCR, Nasal     Status: None   Collection Time: 06/23/24  8:26 PM   Specimen: Nasal Mucosa; Nasal Swab  Result Value Ref Range Status   MRSA by PCR Next Gen NOT DETECTED NOT DETECTED Final    Comment: (NOTE) The GeneXpert MRSA Assay (FDA approved for NASAL specimens only), is one component of a comprehensive MRSA colonization surveillance program. It is not intended to diagnose MRSA infection nor to guide or monitor treatment for MRSA infections. Test performance is not FDA approved in patients less than 12 years old. Performed at [facility],  [address], [place], [place] [address]   Aerobic/Anaerobic Culture w Gram Stain (surgical/deep wound)     Status: None (Preliminary result)   Collection Time: 06/25/24 12:05 PM   Specimen: Foot, Left; Tissue  Result Value Ref Range Status   Specimen Description TISSUE  Final   Special Requests left great toe  Final   Gram Stain   Final    NO WBC SEEN RARE GRAM POSITIVE COCCI Performed at [facility], [address], [place], [place] [address]    Culture PENDING  Incomplete   Report Status PENDING  Incomplete    Labs: CBC: Recent Labs  Lab 06/23/24 1241 06/24/24 0154  WBC 7.2 6.5  NEUTROABS 3.1  --   HGB 12.9* 12.2*  HCT 40.4 37.2*  MCV 99.3 96.9  PLT 348 297   Basic Metabolic Panel: Recent Labs  Lab 06/23/24 1241 06/23/24 2353 06/24/24 0154  NA 139  --  137  K 4.2  --  3.8  CL 103  --  105  CO2 22  --  23  GLUCOSE 173*  --  219*  BUN 20  --  17  CREATININE  1.17  --  0.99  CALCIUM  9.2  --  8.6*  MG  --  2.0 1.9  PHOS  --  2.9 3.0   Liver Function Tests: Recent Labs  Lab 06/23/24 1241 06/24/24 0154  AST 14* 15  ALT 10 9  ALKPHOS 87 71  BILITOT 0.8 0.7  PROT 7.5 6.5  ALBUMIN 3.5 2.9*   CBG: Recent Labs  Lab 06/25/24 1611 06/25/24 2109 06/25/24 2341 06/26/24 0448 06/26/24 0905  GLUCAP 247* 181* 180* 158* 184*    Discharge time spent: greater than 30 minutes.  Signed: Landon FORBES Baller, MD [facility] 06/26/2024

## 2024-06-26 NOTE — Progress Notes (Signed)
 PROGRESS NOTE  TOBYN Peters  DOB: 08-19-1951  PCP: [name], [name], MD FMW:[id number]  DOA: 06/23/2024  LOS: 3 days  Hospital Day: 4  Subjective: Patient was seen and examined this morning. Scheduled for surgery today 06/25/2024  Brief narrative: Timothy Peters is a 73 y.o. male with PMH significant for DM2, HTN, HLD, PAD, diabetic foot ulcers, hiatal hernia, spinal stenosis, treated hep C. Lives at home with wife.  Uses a cane to ambulate.  In August 2025, patient underwent drug-coated balloon angioplasty of the left SFA and angioplasty of the posterior tibial and peroneal artery.  He then underwent left second toe amputation podiatry. He has noticed discoloration of his left great toe over the past several months with recent swelling, and foul-smelling drainage 11/4, he was seen at podiatry office underwent debridement.  With the concern of osteomyelitis, patient was referred to ED.  In the ED, afebrile, heart rate in 70s, blood pressure 170s, breathing on room air. Initial labs with WC count 7.2, lactic acid 1.1, CMP unremarkable MRI left foot showed 1. Active osteomyelitis in the distal phalanx of the great toe. 2. Transverse band of low T1 and high T2 signal in the proximal metaphysis of the proximal phalanx of the small toe, favoring fracture over infection. 3. Nonspecific subcutaneous edema along the dorsum of the foot tracking into the toes, consistent with cellulitis. No drainable abscess identified. 4. Mild degenerative changes in the head of the first metatarsal. 5. 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  Patient was started on broad-spectrum IV antibiotics Admitted to [facility] Vascular surgery consulted  Assessment and plan: Acute osteomyelitis of left great toe  MRI findings as above Podiatry and vascular consulted Noted plan for vascular surgery for ABI and potentially angiography Currently on broad-spectrum IV antibiotics with IV  Unasyn and IV Zyvox No fever.  WBC count normal Pain regimen on PRN Tylenol , Norco, fentanyl  Recent Labs  Lab 06/23/24 1241 06/23/24 1257 06/23/24 1741 06/24/24 0154  WBC 7.2  --   --  6.5  LATICACIDVEN  --  1.1 0.7  --    PAD S/p stent HLD In August 2025, patient underwent drug-coated balloon angioplasty of the left SFA and angioplasty of the posterior tibial and peroneal artery.  Seen by vascular surgery Dr. Serene. Noted plan for ABI and potentially angiography this hospitalization PTA meds- aspirin  81 mg daily, Plavix  75 mg daily, Lipitor  80 mg daily Continue all  Type 2 diabetes mellitus with hyperglycemia Diabetic neuropathy A1c 7.1 on 05/24/2023 PTA meds-Tresiba  28 units daily, metformin  1000 mg twice daily glipizide  PRN Currently on Semglee  15 units daily, SSI/Accu-Cheks Continue Neurontin  300 mg 4 times daily Recent Labs  Lab 06/25/24 1226 06/25/24 1611 06/25/24 2109 06/25/24 2341 06/26/24 0448  GLUCAP 142* 247* 181* 180* 158*   HTN (hypertension) PTA meds- Toprol  25 mg daily, losartan  100 mg daily Resume both  Anxiety/depression Continue Cymbalta  60 mg daily   Mobility: Uses a cane to ambulate.  May need PT postprocedure  Goals of care   Code Status: Full Code    DVT prophylaxis:  SCDs Start: 06/23/24 2317   Antimicrobials: IV Unasyn and IV Zyvox Fluid: None Consultants: Vascular, podiatry Family Communication: None at bedside  Status: Inpatient Level of care:  Telemetry   Patient is from: Home Needs to continue in-hospital care: Pending angiogram, podiatry input Anticipated d/c to: Pending clinical course   Diet:  Diet Order  Diet heart healthy/carb modified Room service appropriate? Yes; Fluid consistency: Thin  Diet effective now                   Scheduled Meds:  amoxicillin -clavulanate  1 tablet Oral BID   aspirin  EC  81 mg Oral Daily   atorvastatin   80 mg Oral Daily   clopidogrel   75 mg Oral Daily    DULoxetine   60 mg Oral Daily   feeding supplement  237 mL Oral Q0600   gabapentin   300 mg Oral QID   insulin  aspart  0-9 Units Subcutaneous Q4H   insulin  glargine-yfgn  15 Units Subcutaneous Q2200   losartan   100 mg Oral Daily   metoprolol  succinate  25 mg Oral Daily   nutrition supplement (JUVEN)  1 packet Oral BID BM   sodium chloride  flush  3 mL Intravenous Q12H    PRN meds: acetaminophen  **OR** acetaminophen , albuterol , fentaNYL  (SUBLIMAZE ) injection, HYDROcodone -acetaminophen , ondansetron  **OR** ondansetron  (ZOFRAN ) IV, sodium chloride  flush   Infusions:     Antimicrobials: Anti-infectives (From admission, onward)    Start     Dose/Rate Route Frequency Ordered Stop   06/25/24 1500  amoxicillin -clavulanate (AUGMENTIN ) 875-125 MG per tablet 1 tablet        1 tablet Oral 2 times daily 06/25/24 1210 06/30/24 0959   06/24/24 0600  linezolid (ZYVOX) IVPB 600 mg  Status:  Discontinued        600 mg 300 mL/hr over 60 Minutes Intravenous Every 12 hours 06/23/24 1935 06/25/24 1209   06/24/24 0000  Ampicillin-Sulbactam (UNASYN) 3 g in sodium chloride  0.9 % 100 mL IVPB  Status:  Discontinued        3 g 200 mL/hr over 30 Minutes Intravenous Every 6 hours 06/23/24 2014 06/25/24 1209   06/23/24 1800  Ampicillin-Sulbactam (UNASYN) 3 g in sodium chloride  0.9 % 100 mL IVPB       Placed in And Linked Group   3 g 200 mL/hr over 30 Minutes Intravenous  Once 06/23/24 1753 06/23/24 1832   06/23/24 1800  linezolid (ZYVOX) IVPB 600 mg       Placed in And Linked Group   600 mg 300 mL/hr over 60 Minutes Intravenous  Once 06/23/24 1753 06/23/24 2006       Objective: Vitals:   06/26/24 0456 06/26/24 0527  BP: 132/81 137/82  Pulse: 60 (!) 59  Resp:    Temp: 97.6 F (36.4 C) (!) 97.5 F (36.4 C)  SpO2: 98% 95%    Intake/Output Summary (Last 24 hours) at 06/26/2024 0756 Last data filed at 06/25/2024 1501 Gross per 24 hour  Intake 517.47 ml  Output --  Net 517.47 ml   Filed Weights    06/23/24 1236 06/25/24 0916  Weight: 87.5 kg 87.5 kg   Weight change:  Body mass index is 26.9 kg/m.   Physical Exam: General exam: Pleasant, elderly African-American male.  Not in distress Skin: No rashes, lesions or ulcers. HEENT: Atraumatic, normocephalic, no obvious bleeding Lungs: Clear to auscultation bilaterally,  CVS: S1, S2, no murmur,   GI/Abd: Soft, nontender, nondistended, bowel sound present,   CNS: Alert, awake, oriented x 3 Psychiatry: Mood appropriate Extremities: No pedal edema, no calf tenderness, left great toe with ulcer  Data Review: I have personally reviewed the laboratory data and studies available.  F/u labs ordered [employer] (From admission, onward)    None       Signed, Landon FORBES Baller, MD [facility]

## 2024-06-26 NOTE — Progress Notes (Addendum)
 Discharge   Patient expressed verbal understanding of discharge POC.   Patient given time to ask any questions.  Additional education included in AVS.  Alert oriented in good spirits.   Tele and PIV removed.  Pressure dressings intact.  Paper prescription for pain med given.  Discharge to [facility].

## 2024-06-27 ENCOUNTER — Observation Stay (HOSPITAL_COMMUNITY)
Admission: EM | Admit: 2024-06-27 | Discharge: 2024-06-28 | Disposition: A | Discharge status: Home / Self Care | Attending: Internal Medicine | Admitting: Internal Medicine

## 2024-06-27 ENCOUNTER — Emergency Department (HOSPITAL_COMMUNITY)

## 2024-06-27 ENCOUNTER — Encounter (HOSPITAL_COMMUNITY): Payer: Self-pay | Admitting: Family Medicine

## 2024-06-27 DIAGNOSIS — I10 Essential (primary) hypertension: Secondary | ICD-10-CM | POA: Diagnosis present

## 2024-06-27 DIAGNOSIS — N179 Acute kidney failure, unspecified: Secondary | ICD-10-CM | POA: Diagnosis not present

## 2024-06-27 DIAGNOSIS — F1190 Opioid use, unspecified, uncomplicated: Secondary | ICD-10-CM | POA: Diagnosis not present

## 2024-06-27 DIAGNOSIS — R55 Syncope and collapse: Principal | ICD-10-CM | POA: Diagnosis present

## 2024-06-27 DIAGNOSIS — Z794 Long term (current) use of insulin: Secondary | ICD-10-CM

## 2024-06-27 DIAGNOSIS — I739 Peripheral vascular disease, unspecified: Secondary | ICD-10-CM | POA: Diagnosis present

## 2024-06-27 DIAGNOSIS — D649 Anemia, unspecified: Secondary | ICD-10-CM | POA: Diagnosis not present

## 2024-06-27 DIAGNOSIS — Z7982 Long term (current) use of aspirin: Secondary | ICD-10-CM | POA: Diagnosis not present

## 2024-06-27 DIAGNOSIS — E1165 Type 2 diabetes mellitus with hyperglycemia: Secondary | ICD-10-CM | POA: Diagnosis not present

## 2024-06-27 DIAGNOSIS — M869 Osteomyelitis, unspecified: Secondary | ICD-10-CM | POA: Diagnosis present

## 2024-06-27 DIAGNOSIS — Z79899 Other long term (current) drug therapy: Secondary | ICD-10-CM | POA: Diagnosis not present

## 2024-06-27 DIAGNOSIS — M86172 Other acute osteomyelitis, left ankle and foot: Secondary | ICD-10-CM | POA: Diagnosis not present

## 2024-06-27 LAB — CBC WITH DIFFERENTIAL/PLATELET
Abs Immature Granulocytes: 0.02 K/uL (ref 0.00–0.07)
Basophils Absolute: 0.1 K/uL (ref 0.0–0.1)
Basophils Relative: 1 %
Eosinophils Absolute: 0.5 K/uL (ref 0.0–0.5)
Eosinophils Relative: 8 %
HCT: 37.9 % — ABNORMAL LOW (ref 39.0–52.0)
Hemoglobin: 12.3 g/dL — ABNORMAL LOW (ref 13.0–17.0)
Immature Granulocytes: 0 %
Lymphocytes Relative: 26 %
Lymphs Abs: 1.6 K/uL (ref 0.7–4.0)
MCH: 32.4 pg (ref 26.0–34.0)
MCHC: 32.5 g/dL (ref 30.0–36.0)
MCV: 99.7 fL (ref 80.0–100.0)
Monocytes Absolute: 0.8 K/uL (ref 0.1–1.0)
Monocytes Relative: 12 %
Neutro Abs: 3.3 K/uL (ref 1.7–7.7)
Neutrophils Relative %: 53 %
Platelets: 346 K/uL (ref 150–400)
RBC: 3.8 MIL/uL — ABNORMAL LOW (ref 4.22–5.81)
RDW: 12.6 % (ref 11.5–15.5)
WBC: 6.2 K/uL (ref 4.0–10.5)
nRBC: 0 % (ref 0.0–0.2)

## 2024-06-27 LAB — URINALYSIS, W/ REFLEX TO CULTURE (INFECTION SUSPECTED)
Bacteria, UA: NONE SEEN
Bilirubin Urine: NEGATIVE
Glucose, UA: >=500 mg/dL — AB
Hgb urine dipstick: NEGATIVE
Ketones, ur: NEGATIVE mg/dL
Leukocytes,Ua: NEGATIVE
Nitrite: NEGATIVE
Protein, ur: NEGATIVE mg/dL
Specific Gravity, Urine: 1.021 (ref 1.005–1.030)
pH: 6 (ref 5.0–8.0)

## 2024-06-27 LAB — COMPREHENSIVE METABOLIC PANEL WITH GFR
ALT: 15 U/L (ref 0–44)
AST: 22 U/L (ref 15–41)
Albumin: 3.2 g/dL — ABNORMAL LOW (ref 3.5–5.0)
Alkaline Phosphatase: 72 U/L (ref 38–126)
Anion gap: 12 (ref 5–15)
BUN: 28 mg/dL — ABNORMAL HIGH (ref 8–23)
CO2: 21 mmol/L — ABNORMAL LOW (ref 22–32)
Calcium: 8.9 mg/dL (ref 8.9–10.3)
Chloride: 108 mmol/L (ref 98–111)
Creatinine, Ser: 1.34 mg/dL — ABNORMAL HIGH (ref 0.61–1.24)
GFR, Estimated: 56 mL/min — ABNORMAL LOW (ref >60)
Glucose, Bld: 146 mg/dL — ABNORMAL HIGH (ref 70–99)
Potassium: 3.8 mmol/L (ref 3.5–5.1)
Sodium: 141 mmol/L (ref 135–145)
Total Bilirubin: 0.6 mg/dL (ref 0.0–1.2)
Total Protein: 6.9 g/dL (ref 6.5–8.1)

## 2024-06-27 LAB — I-STAT CHEM 8, ED
BUN: 30 mg/dL — ABNORMAL HIGH (ref 8–23)
Calcium, Ion: 1.1 mmol/L — ABNORMAL LOW (ref 1.15–1.40)
Chloride: 108 mmol/L (ref 98–111)
Creatinine, Ser: 1.5 mg/dL — ABNORMAL HIGH (ref 0.61–1.24)
Glucose, Bld: 143 mg/dL — ABNORMAL HIGH (ref 70–99)
HCT: 37 % — ABNORMAL LOW (ref 39.0–52.0)
Hemoglobin: 12.6 g/dL — ABNORMAL LOW (ref 13.0–17.0)
Potassium: 3.8 mmol/L (ref 3.5–5.1)
Sodium: 141 mmol/L (ref 135–145)
TCO2: 20 mmol/L — ABNORMAL LOW (ref 22–32)

## 2024-06-27 LAB — I-STAT CG4 LACTIC ACID, ED
Lactic Acid, Venous: 2.7 mmol/L (ref 0.5–1.9)
Lactic Acid, Venous: 1.8 mmol/L (ref 0.5–1.9)

## 2024-06-27 LAB — D-DIMER, QUANTITATIVE: D-Dimer, Quant: 0.64 ug{FEU}/mL — ABNORMAL HIGH (ref 0.00–0.50)

## 2024-06-27 LAB — GLUCOSE, CAPILLARY: Glucose-Capillary: 134 mg/dL — ABNORMAL HIGH (ref 70–99)

## 2024-06-27 MED ORDER — ENOXAPARIN SODIUM 40 MG/0.4ML IJ SOSY
40.0000 mg | PREFILLED_SYRINGE | INTRAMUSCULAR | Status: DC
  Administered 2024-06-28: 40 mg via SUBCUTANEOUS
  Filled 2024-06-27: qty 0.4

## 2024-06-27 MED ORDER — INSULIN ASPART 100 UNIT/ML IJ SOLN: 0.0000 [IU] | Freq: Every day | INTRAMUSCULAR | Status: DC

## 2024-06-27 MED ORDER — DULOXETINE HCL 60 MG PO CPEP
60.0000 mg | ORAL_CAPSULE | Freq: Every day | ORAL | Status: DC
  Administered 2024-06-28: 60 mg via ORAL
  Filled 2024-06-27: qty 1

## 2024-06-27 MED ORDER — GABAPENTIN 300 MG PO CAPS: 300.0000 mg | ORAL_CAPSULE | Freq: Four times a day (QID) | ORAL | Status: DC

## 2024-06-27 MED ORDER — AMOXICILLIN-POT CLAVULANATE 875-125 MG PO TABS
1.0000 | ORAL_TABLET | Freq: Two times a day (BID) | ORAL | Status: DC
  Administered 2024-06-27 – 2024-06-28 (×2): 1 via ORAL
  Filled 2024-06-27 (×2): qty 1

## 2024-06-27 MED ORDER — ONDANSETRON HCL 4 MG/2ML IJ SOLN: 4.0000 mg | Freq: Four times a day (QID) | INTRAMUSCULAR | Status: DC | PRN

## 2024-06-27 MED ORDER — GABAPENTIN 300 MG PO CAPS
300.0000 mg | ORAL_CAPSULE | Freq: Three times a day (TID) | ORAL | Status: DC
  Administered 2024-06-27 – 2024-06-28 (×2): 300 mg via ORAL
  Filled 2024-06-27 (×3): qty 1

## 2024-06-27 MED ORDER — CLOPIDOGREL BISULFATE 75 MG PO TABS
75.0000 mg | ORAL_TABLET | Freq: Every day | ORAL | Status: DC
  Administered 2024-06-28: 75 mg via ORAL
  Filled 2024-06-27: qty 1

## 2024-06-27 MED ORDER — ACETAMINOPHEN 650 MG RE SUPP
650.0000 mg | Freq: Four times a day (QID) | RECTAL | Status: DC | PRN
Start: 2024-06-27 — End: 2024-06-28

## 2024-06-27 MED ORDER — SODIUM CHLORIDE 0.9 % IV BOLUS
1000.0000 mL | Freq: Once | INTRAVENOUS | Status: AC
  Administered 2024-06-27: 1000 mL via INTRAVENOUS

## 2024-06-27 MED ORDER — HYDROCODONE-ACETAMINOPHEN 5-325 MG PO TABS
1.0000 | ORAL_TABLET | ORAL | Status: DC | PRN
Start: 2024-06-27 — End: 2024-06-28
  Administered 2024-06-27 – 2024-06-28 (×3): 2 via ORAL
  Filled 2024-06-27 (×3): qty 2

## 2024-06-27 MED ORDER — INSULIN ASPART 100 UNIT/ML IJ SOLN
0.0000 [IU] | Freq: Three times a day (TID) | INTRAMUSCULAR | Status: DC
  Administered 2024-06-28: 1 [IU] via SUBCUTANEOUS
  Administered 2024-06-28: 2 [IU] via SUBCUTANEOUS
  Filled 2024-06-27 (×2): qty 2

## 2024-06-27 MED ORDER — ASPIRIN 81 MG PO CHEW
81.0000 mg | CHEWABLE_TABLET | Freq: Every day | ORAL | Status: DC
  Administered 2024-06-28: 81 mg via ORAL
  Filled 2024-06-27: qty 1

## 2024-06-27 MED ORDER — INSULIN GLARGINE-YFGN 100 UNIT/ML ~~LOC~~ SOLN
18.0000 [IU] | Freq: Every day | SUBCUTANEOUS | Status: DC
  Administered 2024-06-28: 18 [IU] via SUBCUTANEOUS
  Filled 2024-06-27: qty 0.18

## 2024-06-27 MED ORDER — SODIUM CHLORIDE 0.9% FLUSH
3.0000 mL | Freq: Two times a day (BID) | INTRAVENOUS | Status: DC
  Administered 2024-06-27 – 2024-06-28 (×2): 3 mL via INTRAVENOUS

## 2024-06-27 MED ORDER — SODIUM CHLORIDE 0.9 % IV SOLN: INTRAVENOUS | Status: AC

## 2024-06-27 MED ORDER — ACETAMINOPHEN 325 MG PO TABS: 650.0000 mg | ORAL_TABLET | Freq: Four times a day (QID) | ORAL | Status: DC | PRN

## 2024-06-27 MED ORDER — SENNOSIDES-DOCUSATE SODIUM 8.6-50 MG PO TABS: 1.0000 | ORAL_TABLET | Freq: Every evening | ORAL | Status: DC | PRN

## 2024-06-27 MED ORDER — ATORVASTATIN CALCIUM 80 MG PO TABS
80.0000 mg | ORAL_TABLET | Freq: Every day | ORAL | Status: DC
  Administered 2024-06-28: 80 mg via ORAL
  Filled 2024-06-27: qty 1

## 2024-06-27 MED ORDER — ONDANSETRON HCL 4 MG PO TABS: 4.0000 mg | ORAL_TABLET | Freq: Four times a day (QID) | ORAL | Status: DC | PRN

## 2024-06-27 NOTE — ED Triage Notes (Addendum)
 Pt BIB [facility] for a fall coming from home. Syncopal episode and hit his head with LOC. On Plavix  and Metoprolol . No C collar. Patient does not report no new neck or back pain on arrival.  [facility]: 20G IV L hand 750mL NS 60HR 74/40 sitting 110/60 on arrival CBG 195

## 2024-06-27 NOTE — ED Notes (Signed)
 Patient transported to CT with RN
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
## 2024-06-27 NOTE — ED Notes (Signed)
 Floor called and notified that patient was being transported upstairs.
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
## 2024-06-27 NOTE — Progress Notes (Signed)
 NEW ADMISSION NOTE New Admission Note:    Arrival Method: ED stretcher Mental Orientation: AAOx3 Telemetry: [phone] Assessment: Completed Skin: See flowsheet IV: RFA Pain: 6/10 Tubes: n/a Safety Measures: Safety Fall Prevention Plan has been given, discussed and signed Admission: Completed [facility] Orientation: Patient has been orientated to the room, unit and staff.  Family: none at bedside   Orders have been reviewed and implemented. Will continue to monitor the patient. Call light has been placed within reach and bed alarm has been activated.

## 2024-06-28 ENCOUNTER — Other Ambulatory Visit (HOSPITAL_COMMUNITY): Payer: Self-pay

## 2024-06-28 ENCOUNTER — Observation Stay (HOSPITAL_COMMUNITY)

## 2024-06-28 ENCOUNTER — Other Ambulatory Visit: Payer: Self-pay

## 2024-06-28 ENCOUNTER — Encounter (HOSPITAL_COMMUNITY): Payer: Self-pay | Admitting: Podiatry

## 2024-06-28 DIAGNOSIS — R55 Syncope and collapse: Secondary | ICD-10-CM

## 2024-06-28 LAB — BASIC METABOLIC PANEL WITH GFR
Anion gap: 11 (ref 5–15)
BUN: 24 mg/dL — ABNORMAL HIGH (ref 8–23)
CO2: 22 mmol/L (ref 22–32)
Calcium: 8.5 mg/dL — ABNORMAL LOW (ref 8.9–10.3)
Chloride: 106 mmol/L (ref 98–111)
Creatinine, Ser: 1.1 mg/dL (ref 0.61–1.24)
GFR, Estimated: >60 mL/min (ref >60)
Glucose, Bld: 209 mg/dL — ABNORMAL HIGH (ref 70–99)
Potassium: 3.8 mmol/L (ref 3.5–5.1)
Sodium: 139 mmol/L (ref 135–145)

## 2024-06-28 LAB — CBC
HCT: 33.5 % — ABNORMAL LOW (ref 39.0–52.0)
Hemoglobin: 11.3 g/dL — ABNORMAL LOW (ref 13.0–17.0)
MCH: 32.5 pg (ref 26.0–34.0)
MCHC: 33.7 g/dL (ref 30.0–36.0)
MCV: 96.3 fL (ref 80.0–100.0)
Platelets: 327 K/uL (ref 150–400)
RBC: 3.48 MIL/uL — ABNORMAL LOW (ref 4.22–5.81)
RDW: 12.6 % (ref 11.5–15.5)
WBC: 6.2 K/uL (ref 4.0–10.5)
nRBC: 0 % (ref 0.0–0.2)

## 2024-06-28 LAB — ECHOCARDIOGRAM COMPLETE
Area-P 1/2: 4.24 cm2
Height: 71 in
S' Lateral: 3.1 cm
Weight: 3231.06 [oz_av]

## 2024-06-28 LAB — GLUCOSE, CAPILLARY
Glucose-Capillary: 151 mg/dL — ABNORMAL HIGH (ref 70–99)
Glucose-Capillary: 205 mg/dL — ABNORMAL HIGH (ref 70–99)
Glucose-Capillary: 144 mg/dL — ABNORMAL HIGH (ref 70–99)

## 2024-06-28 MED ORDER — GABAPENTIN 300 MG PO CAPS
300.0000 mg | ORAL_CAPSULE | Freq: Four times a day (QID) | ORAL | 3 refills | Status: AC
  Filled 2024-06-28 – 2024-09-20 (×2): qty 360, 90d supply, fill #0

## 2024-06-28 MED ORDER — MORPHINE SULFATE (PF) 2 MG/ML IV SOLN
2.0000 mg | INTRAVENOUS | Status: DC | PRN
  Administered 2024-06-28 (×2): 2 mg via INTRAVENOUS
  Filled 2024-06-28 (×2): qty 1

## 2024-06-28 NOTE — TOC CAGE-AID Note (Signed)
 Transition of Care [facility]) - CAGE-AID Screening   Patient Details  Name: Timothy Peters MRN: [id number] Date of Birth: 10-21-1950  Transition of [facility]) CM/SW Contact:    Jonna Dittrich E Latrina Guttman, LCSW Phone Number: 06/28/2024, 10:07 AM   Clinical Narrative: No current SA noted.  CAGE-AID Screening:    Have You Ever Felt You Ought to Cut Down on Your Drinking or Drug Use?: No Have People Annoyed You By Critizing Your Drinking Or Drug Use?: No Have You Felt Bad Or Guilty About Your Drinking Or Drug Use?: No Have You Ever Had a Drink or Used Drugs First Thing In The Morning to Steady Your Nerves or to Get Rid of a Hangover?: No CAGE-AID Score: 0  Substance Abuse Education Offered: No

## 2024-06-28 NOTE — Progress Notes (Signed)
 Discharge Nurse Summary: DC order noted per MD. DC RN at bedside with patient. Patient agreeable with discharge plan, states family will arrive soon for pickup. AVS printed/reviewed. PIV removed, skin intact. No DME needs. No home/TOC meds. CP/Edu resolved. Telemonitor returned to charging station. All belongings accounted for. Dressing to wound, CDI w/o bleeding or drainage. See LDAs. Patient wheeled downstairs for discharge by private auto.   Rosario EMERSON Lund, RN

## 2024-06-28 NOTE — Progress Notes (Signed)
 Patient BP is 172/74. Rechecked: 176/85. Informed MD on call. No concerns at the moment, elevated BP is result of his BP meds being held. No barrier for discharge.

## 2024-06-28 NOTE — Anesthesia Postprocedure Evaluation (Signed)
 Anesthesia Post Note  Patient: Keyonta B Wiker  Procedure(s) Performed: AMPUTATION, TOE (Left: Toe)     Patient location during evaluation: PACU Anesthesia Type: MAC Level of consciousness: awake and alert Pain management: pain level controlled Vital Signs Assessment: post-procedure vital signs reviewed and stable Respiratory status: spontaneous breathing, nonlabored ventilation and respiratory function stable Cardiovascular status: stable and blood pressure returned to baseline Postop Assessment: no apparent nausea or vomiting Anesthetic complications: no   No notable events documented.  Last Vitals:    Last Pain:                 Garnette FORBES Skillern

## 2024-06-28 NOTE — Progress Notes (Signed)
  Echocardiogram 2D Echocardiogram has been performed.  Timothy Peters 06/28/2024, 4:14 PM

## 2024-06-28 NOTE — Discharge Summary (Signed)
 Physician Discharge Summary  Timothy Peters FMW:[id number] DOB: 07/07/1951 DOA: 06/27/2024  PCP: Ransom Other, MD  Admit date: 06/27/2024 Discharge date: 06/28/2024  Admitted From: home Disposition:  home  Recommendations for Outpatient Follow-up:  Follow up with PCP in 1-2 weeks Follow up with podiatry as scheduled  Home Health: none Equipment/Devices: none  Discharge Condition: stable CODE STATUS: Full code Diet Orders (From admission, onward)     Start     Ordered   06/27/24 1939  Diet regular Room service appropriate? Yes; Fluid consistency: Thin  Diet effective now       Question Answer Comment  Room service appropriate? Yes   Fluid consistency: Thin      06/27/24 1939            HPI: Per admitting MD, Timothy Peters is a 73 y.o. male with medical history significant for hypertension, hyperlipidemia, type 2 diabetes mellitus, PAD, treated hepatitis C, and osteomyelitis of the left great toe status post amputation on 06/25/2024, now presenting after syncopal episode at home. Patient reports that he is doing well back at home following the recent hospitalization and was sitting on his back porch this afternoon when he suddenly felt hot and generally unwell.  He stood to go inside, became acutely lightheaded, felt that he was becoming sweaty, and appeared to his nephew to suffer a brief loss of consciousness.  Patient is uncertain whether he fully lost consciousness or not but notes that he was severely lightheaded and felt as though he was about to lose consciousness.  There was no associated chest discomfort or palpitations.  He felt very fatigued following the episode but has returned to his usual self by time of hospital admission. He was found by EMS to have BP 74/40 and was given 750 mL NS prior to arrival in the ED.  Hospital Course / Discharge diagnoses: Principal problem Syncope -patient admitted to the hospital with a syncopal episode however there was no  full loss of consciousness per patient.  He received IV fluids with significant improvement in his condition.  He was no longer hypotensive, nor orthostatic.  Underwent a 2D echocardiogram which showed a normal LVEF, no wall motion abnormalities, normal RV.  He is back to baseline, ambulatory, and will be discharged home in stable condition  Active problems Essential hypertension-he was hypotensive initially, but after hydration became hypertensive.  Resume home medications Left great toe osteomyelitis, status post amputation 06/25/2024-continue Augmentin , weightbearing as tolerated per podiatry, outpatient follow-up PAD-continue aspirin , Plavix , Lipitor  DM2, with hyperglycemia-continue home medications AKI-creatinine improved with fluids Mild normocytic anemia - stable, no bleeding  Sepsis ruled out   Discharge Instructions   Allergies as of 06/28/2024   No Known Allergies      Medication List     TAKE these medications    Accu-Chek Guide w/Device Kit Use as directed up to 4 times daily   Accu-Chek Softclix Lancets lancets Test up to 4 times a day as directed   amoxicillin -clavulanate 875-125 MG tablet Commonly known as: AUGMENTIN  Take 1 tablet by mouth 2 (two) times daily for 4 days.   aspirin  81 MG tablet Take 1 tablet (81 mg total) by mouth daily. Resume 4 days post-op What changed: additional instructions   atorvastatin  80 MG tablet Commonly known as: LIPITOR  Take 1 tablet (80 mg total) by mouth daily.   B-D UF III MINI PEN NEEDLES 31G X 5 MM Misc Generic drug: Insulin  Pen Needle Use with lantus  daily.  BD Pen Needle Micro U/F 32G X 6 MM Misc Generic drug: Insulin  Pen Needle Use as directed   clopidogrel  75 MG tablet Commonly known as: PLAVIX  Take 1 tablet (75 mg total) by mouth daily.   Dexcom G7 Sensor Misc [place]  as directed to continuously monitor blood glucose, replacing sensor every 10 days   DULoxetine  60 MG capsule Commonly known as:  CYMBALTA  Take 1 capsule (60 mg total) by mouth daily.   Farxiga  10 MG Tabs tablet Generic drug: dapagliflozin  propanediol Take 1 tablet (10 mg total) by mouth daily.   gabapentin  300 MG capsule Commonly known as: NEURONTIN  Take 1 capsule (300 mg total) by mouth 4 (four) times daily.   glipiZIDE  5 MG tablet Commonly known as: GLUCOTROL  Take 1 tablet (5 mg total) by mouth daily if blood sugar is >150 mg/dL. What changed:  when to take this additional instructions   HYDROcodone -acetaminophen  5-325 MG tablet Commonly known as: NORCO/VICODIN Take 1-2 tablets by mouth every 4 (four) hours as needed for moderate pain (pain score 4-6).   ibuprofen  200 MG tablet Commonly known as: ADVIL  Take 400 mg by mouth daily as needed for moderate pain (pain score 4-6).   IRON PO Take 1 tablet by mouth daily.   losartan  100 MG tablet Commonly known as: COZAAR  Take 1 tablet (100 mg total) by mouth daily.   metFORMIN  1000 MG tablet Commonly known as: GLUCOPHAGE  Take 1 tablet (1,000 mg total) by mouth 2 (two) times daily with a meal.   metoprolol  succinate 25 MG 24 hr tablet Commonly known as: TOPROL -XL Take 1 tablet (25 mg total) by mouth daily.   Tresiba  FlexTouch 100 UNIT/ML FlexTouch Pen Generic drug: insulin  degludec Inject 28 Units into the skin daily.        Follow-up Information     Ransom Other, MD Follow up in 1 week(s).   Specialty: Internal Medicine Contact information: [address] [place] [address] [phone]                 Consultations: none  Procedures/Studies:  ECHOCARDIOGRAM COMPLETE Result Date: 06/28/2024    ECHOCARDIOGRAM REPORT   Patient Name:   Timothy Peters Date of Exam: 06/28/2024 Medical Rec #:  [id number]        Height:       71.0 in Accession #:    [id number]       Weight:       201.9 lb Date of Birth:  [birth year]        BSA:          2.117 m Patient Age:    73 years         BP:           157/61 mmHg Patient  Gender: M                HR:           66 bpm. Exam Location:  Inpatient Procedure: 2D Echo (Both Spectral and Color Flow Doppler were utilized during            procedure). Indications:    syncope  History:        Patient has prior history of Echocardiogram examinations, most                 recent 07/09/2022. PAD; Risk Factors:Hypertension, Dyslipidemia,                 Diabetes and Sleep Apnea.  Sonographer:  Tinnie Barefoot RDCS Referring Phys: [id number] TIMOTHY S OPYD IMPRESSIONS  1. Left ventricular ejection fraction, by estimation, is 60 to 65%. The left ventricle has normal function. The left ventricle has no regional wall motion abnormalities. There is mild left ventricular hypertrophy of the basal-septal segment. Left ventricular diastolic parameters were normal.  2. Right ventricular systolic function is normal. The right ventricular size is normal. There is mildly elevated pulmonary artery systolic pressure. The estimated right ventricular systolic pressure is 36.4 mmHg.  3. The mitral valve is normal in structure. Trivial mitral valve regurgitation. No evidence of mitral stenosis.  4. The aortic valve is calcified. There is moderate calcification of the aortic valve. There is moderate thickening of the aortic valve. Aortic valve regurgitation is not visualized. Aortic valve sclerosis/calcification is present, without any evidence of aortic stenosis.  5. The inferior vena cava is normal in size with greater than 50% respiratory variability, suggesting right atrial pressure of 3 mmHg. FINDINGS  Left Ventricle: Left ventricular ejection fraction, by estimation, is 60 to 65%. The left ventricle has normal function. The left ventricle has no regional wall motion abnormalities. The left ventricular internal cavity size was normal in size. There is  mild left ventricular hypertrophy of the basal-septal segment. Left ventricular diastolic parameters were normal. Normal left ventricular filling pressure. Right  Ventricle: The right ventricular size is normal. No increase in right ventricular wall thickness. Right ventricular systolic function is normal. There is mildly elevated pulmonary artery systolic pressure. The tricuspid regurgitant velocity is 2.89  m/s, and with an assumed right atrial pressure of 3 mmHg, the estimated right ventricular systolic pressure is 36.4 mmHg. Left Atrium: Left atrial size was normal in size. Right Atrium: Right atrial size was normal in size. Pericardium: There is no evidence of pericardial effusion. Mitral Valve: The mitral valve is normal in structure. Trivial mitral valve regurgitation. No evidence of mitral valve stenosis. Tricuspid Valve: The tricuspid valve is normal in structure. Tricuspid valve regurgitation is trivial. No evidence of tricuspid stenosis. Aortic Valve: The aortic valve is calcified. There is moderate calcification of the aortic valve. There is moderate thickening of the aortic valve. Aortic valve regurgitation is not visualized. Aortic valve sclerosis/calcification is present, without any  evidence of aortic stenosis. Pulmonic Valve: The pulmonic valve was normal in structure. Pulmonic valve regurgitation is trivial. No evidence of pulmonic stenosis. Aorta: The aortic root is normal in size and structure. Venous: The inferior vena cava is normal in size with greater than 50% respiratory variability, suggesting right atrial pressure of 3 mmHg. IAS/Shunts: No atrial level shunt detected by color flow Doppler.  LEFT VENTRICLE PLAX 2D LVIDd:         4.90 cm   Diastology LVIDs:         3.10 cm   LV e' medial:    7.94 cm/s LV PW:         1.00 cm   LV E/e' medial:  13.9 LV IVS:        1.30 cm   LV e' lateral:   8.92 cm/s LVOT diam:     2.30 cm   LV E/e' lateral: 12.3 LV SV:         100 LV SV Index:   47 LVOT Area:     4.15 cm LV IVRT:       106 msec  RIGHT VENTRICLE             IVC RV Basal diam:  3.00 cm  IVC diam: 2.00 cm RV S prime:     12.30 cm/s TAPSE (M-mode):  3.0 cm LEFT ATRIUM             Index        RIGHT ATRIUM           Index LA diam:        4.20 cm 1.98 cm/m   RA Area:     15.30 cm LA Vol (A2C):   62.6 ml 29.57 ml/m  RA Volume:   36.90 ml  17.43 ml/m LA Vol (A4C):   60.3 ml 28.48 ml/m LA Biplane Vol: 61.5 ml 29.05 ml/m  AORTIC VALVE LVOT Vmax:   97.90 cm/s LVOT Vmean:  64.800 cm/s LVOT VTI:    0.241 m  AORTA Ao Root diam: 3.50 cm Ao Asc diam:  3.50 cm MITRAL VALVE                TRICUSPID VALVE MV Area (PHT): 4.24 cm     TR Peak grad:   33.4 mmHg MV Decel Time: 179 msec     TR Vmax:        289.00 cm/s MV E velocity: 110.00 cm/s MV A velocity: 91.30 cm/s   SHUNTS MV E/A ratio:  1.20         Systemic VTI:  0.24 m                             Systemic Diam: 2.30 cm Wilbert Bihari MD Electronically signed by Wilbert Bihari MD Signature Date/Time: 06/28/2024/4:30:21 PM    Final    CT Cervical Spine Wo Contrast Result Date: 06/27/2024 EXAM: CT CERVICAL SPINE WITHOUT CONTRAST 06/27/2024 05:07:22 PM TECHNIQUE: CT of the cervical spine was performed without the administration of intravenous contrast. Multiplanar reformatted images are provided for review. Automated exposure control, iterative reconstruction, and/or weight based adjustment of the mA/kV was utilized to reduce the radiation dose to as low as reasonably achievable. COMPARISON: Comparison with 05/14/2024. CLINICAL HISTORY: Neck trauma (Age >= 65y) FINDINGS: CERVICAL SPINE: BONES AND ALIGNMENT: Chronic retrolisthesis of C3. No acute fracture or traumatic malalignment. DEGENERATIVE CHANGES: Advanced spondylosis and disc space height loss at C3-C4 and C5-C6. Posterior disc osteophyte complex at C3-C4 causes moderate to severe spinal canal narrowing. Posterior disc osteophyte complex at C5-C6 causes moderate to advanced spinal canal narrowing. SOFT TISSUES: No prevertebral soft tissue swelling. IMPRESSION: 1. No acute abnormality of the cervical spine . Electronically signed by: Norman Gatlin MD 06/27/2024  05:26 PM EST RP Workstation: [id number]   CT Head Wo Contrast Result Date: 06/27/2024 EXAM: CT HEAD WITHOUT CONTRAST 06/27/2024 05:07:22 PM TECHNIQUE: CT of the head was performed without the administration of intravenous contrast. Automated exposure control, iterative reconstruction, and/or weight based adjustment of the mA/kV was utilized to reduce the radiation dose to as low as reasonably achievable. COMPARISON: 05/14/2024 CLINICAL HISTORY: Head trauma, minor (Age >= 65y) FINDINGS: BRAIN AND VENTRICLES: No acute hemorrhage. No evidence of acute infarct. No hydrocephalus. No extra-axial collection. No mass effect or midline shift. ORBITS: Bilateral lens replacements. SINUSES: No acute abnormality. SOFT TISSUES AND SKULL: No acute soft tissue abnormality. No skull fracture. IMPRESSION: 1. No acute intracranial abnormality. Electronically signed by: Norman Gatlin MD 06/27/2024 05:24 PM EST RP Workstation: [id number]   DG Chest Portable 1 View Result Date: 06/27/2024 EXAM: 1 VIEW XRAY OF THE CHEST 06/27/2024 04:42:00 PM COMPARISON: Nov 11 / 5 / 25 CLINICAL HISTORY: shob  shob FINDINGS: LUNGS AND PLEURA: No focal pulmonary opacity. No pulmonary edema. No pleural effusion. No pneumothorax. HEART AND MEDIASTINUM: No acute abnormality of the cardiac and mediastinal silhouettes. BONES AND SOFT TISSUES: No acute osseous abnormality. IMPRESSION: 1. No acute process. Electronically signed by: Norman Gatlin MD 06/27/2024 04:53 PM EST RP Workstation: [id number]   DG Foot 2 Views Left Result Date: 06/25/2024 EXAM: 2 VIEW(S) XRAY OF THE LEFT FOOT 06/25/2024 01:03:00 PM COMPARISON: 03/25/2024 CLINICAL HISTORY: Post-operative state. FINDINGS: BONES AND JOINTS: Interval amputation of the first digit at the level of the Proximal first metatarsal diaphysis. Prior second digit amputation at the level of the Distal second proximal phalanx. No joint dislocation. SOFT TISSUES: Mild soft tissue swelling. Vascular  calcifications. Retained BBs project over the Posterior ankle and heel. IMPRESSION: 1. Interval amputation of the first digit at the level of the proximal first metatarsal diaphysis. Electronically signed by: Norman Gatlin MD 06/25/2024 08:42 PM EST RP Workstation: [id number]   VAS US  LOWER EXTREMITY ARTERIAL DUPLEX Result Date: 06/25/2024 LOWER EXTREMITY ARTERIAL DUPLEX STUDY Patient Name:  Adil B Hornung  Date of Exam:   06/24/2024 Medical Rec #: [id number]         Accession #:    [id number] Date of Birth: 02/21/1951         Patient Gender: M Patient Age:   7 years Exam Location:  [facility] Procedure:      VAS US  LOWER EXTREMITY ARTERIAL DUPLEX Referring Phys: GAILE NEW --------------------------------------------------------------------------------  Indications: Ulceration, and peripheral artery disease. High Risk Factors: Hypertension, hyperlipidemia, Diabetes, past history of                    smoking.  Vascular Interventions: Amputation of the 2nd digit of the left lower extremity                         on 6.1.2025. Current ABI:            Right: 1.06                         Left 1.00 Comparison Study: previous study on 9.11.2025. Performing Technologist: Edilia Elden Appl  Examination Guidelines: A complete evaluation includes B-mode imaging, spectral Doppler, color Doppler, and power Doppler as needed of all accessible portions of each vessel. Bilateral testing is considered an integral part of a complete examination. Limited examinations for reoccurring indications may be performed as noted.   +-----------+--------+-----+---------------+----------+--------+ LEFT       PSV cm/sRatioStenosis       Waveform  Comments +-----------+--------+-----+---------------+----------+--------+ CFA Prox   114                         biphasic           +-----------+--------+-----+---------------+----------+--------+ CFA Distal 126                         triphasic           +-----------+--------+-----+---------------+----------+--------+ DFA        122                         biphasic           +-----------+--------+-----+---------------+----------+--------+ SFA Prox   91  triphasic          +-----------+--------+-----+---------------+----------+--------+ SFA Mid    121                         triphasic          +-----------+--------+-----+---------------+----------+--------+ SFA Distal 98                          triphasic          +-----------+--------+-----+---------------+----------+--------+ POP Prox   111                         triphasic          +-----------+--------+-----+---------------+----------+--------+ POP Distal 74                          triphasic          +-----------+--------+-----+---------------+----------+--------+ TP Trunk   232          50-74% stenosistriphasic          +-----------+--------+-----+---------------+----------+--------+ ATA Prox   128                         triphasic          +-----------+--------+-----+---------------+----------+--------+ ATA Mid    60                          triphasic          +-----------+--------+-----+---------------+----------+--------+ ATA Distal 19                          monophasic         +-----------+--------+-----+---------------+----------+--------+ PTA Prox   230                         monophasic         +-----------+--------+-----+---------------+----------+--------+ PTA Mid    115                         monophasic         +-----------+--------+-----+---------------+----------+--------+ PTA Distal 185                         monophasic         +-----------+--------+-----+---------------+----------+--------+ PERO Prox  80                          triphasic          +-----------+--------+-----+---------------+----------+--------+ PERO Mid   105                         triphasic           +-----------+--------+-----+---------------+----------+--------+ PERO Distal75                          biphasic           +-----------+--------+-----+---------------+----------+--------+ DP         21                          monophasic         +-----------+--------+-----+---------------+----------+--------+ Decreased flow in  distal anterior tibial artery. Elevated velocities in the tibioperoneal trunk still observed. Elevated velocities also noted in the proximal posterior tibial artery.  Summary: Left: Decreased flow noted in the left ATA, elevated velocity in TP trunk still noted and observed in the proximal posterior tibial artery.  See table(s) above for measurements and observations. Electronically signed by Lonni Gaskins MD on 06/25/2024 at 9:35:17 AM.    Final    VAS US  ABI WITH/WO TBI Result Date: 06/24/2024  LOWER EXTREMITY DOPPLER STUDY Patient Name:  BAYRON DALTO  Date of Exam:   06/24/2024 Medical Rec #: [id number]         Accession #:    [id number] Date of Birth: [birth year]         Patient Gender: M Patient Age:   46 years Exam Location:  [facility] Procedure:      VAS US  ABI WITH/WO TBI Referring Phys: GAILE NEW --------------------------------------------------------------------------------  Indications: Ulceration, and peripheral artery disease. High Risk         Hypertension, hyperlipidemia, Diabetes, past history of Factors:          smoking.  Vascular Interventions: Amputation of the 2nd digit of the left foot on                         6.7.2025. Comparison Study: Previous study on 9.11.2025. Performing Technologist: Edilia Elden Appl  Examination Guidelines: A complete evaluation includes at minimum, Doppler waveform signals and systolic blood pressure reading at the level of bilateral brachial, anterior tibial, and posterior tibial arteries, when vessel segments are accessible. Bilateral testing is considered an integral part of a complete examination.  Photoelectric Plethysmograph (PPG) waveforms and toe systolic pressure readings are included as required and additional duplex testing as needed. Limited examinations for reoccurring indications may be performed as noted.  ABI Findings: +---------+------------------+-----+-----------+--------+ Right    Rt Pressure (mmHg)IndexWaveform   Comment  +---------+------------------+-----+-----------+--------+ Brachial 186                    triphasic           +---------+------------------+-----+-----------+--------+ PTA      198               1.06 multiphasic         +---------+------------------+-----+-----------+--------+ DP       187               1.01 monophasic          +---------+------------------+-----+-----------+--------+ Great Toe112               0.60 Normal              +---------+------------------+-----+-----------+--------+ +---------+------------------+-----+-----------+-------+ Left     Lt Pressure (mmHg)IndexWaveform   Comment +---------+------------------+-----+-----------+-------+ Brachial 177                    triphasic          +---------+------------------+-----+-----------+-------+ PTA      186               1.00 multiphasic        +---------+------------------+-----+-----------+-------+ DP       185               0.99 monophasic         +---------+------------------+-----+-----------+-------+ Great Toe118               0.63 Normal             +---------+------------------+-----+-----------+-------+ +-------+-----------+-----------+------------+------------+  ABI/TBIToday's ABIToday's TBIPrevious ABIPrevious TBI +-------+-----------+-----------+------------+------------+ Right  1.06       0.60                                +-------+-----------+-----------+------------+------------+ Left   1.00       0.63                                +-------+-----------+-----------+------------+------------+   Summary: Right: Resting  right ankle-brachial index is within normal range. The right toe-brachial index is abnormal. Right toe pressure is >60 mmHg which suggests adequate perfusion for healing. Left: Resting left ankle-brachial index is within normal range. The left toe-brachial index is abnormal. Left toe pressure is >60 mmHg which suggests adequate perfusion for healing. *See table(s) above for measurements and observations.  Electronically signed by Norman Serve on 06/24/2024 at 7:03:28 PM.    Final    MR FOOT LEFT W WO CONTRAST Result Date: 06/23/2024 EXAM: MRI of the left Foot without and with contrast. 06/23/2024 04:52:00 PM TECHNIQUE: Multiplanar multisequence MRI of the left foot was performed without and with the administration of 8.5 mL gadobutrol (GADAVIST) 1 MMOL/ML intravenous contrast. COMPARISON: 06/22/2024 CLINICAL HISTORY: Soft tissue infection, suspicion for great toe osteomyelitis. FINDINGS: LISFRANC JOINT: Degenerative arthropathy at the Lisfranc joint including spurring. BONE MARROW: Prior amputation of the second toe at the distal metaphysis proximal phalangeal level. Diffuse edema and enhancement in the distal phalanx great toe compatible with active osteomyelitis as on image 13 series 8. Transverse band of low T1 and high T2 signal in the proximal metaphysis of the proximal phalanx small toe favoring fracture over infection. GREATER AND LESSER MTP JOINTS: Mild degenerative findings in the head of the 1st metatarsal. No significant joint effusion or osseous erosions. Normal alignment. SOFT TISSUES: Nonspecific subcutaneous edema along the dorsum of the foot tracking into the toes, cellulitis is a distinct possibility. No drainable abscess identified. TENDONS: Visualized flexor and extensor tendons are intact without tenosynovitis. IMPRESSION: 1. Active osteomyelitis in the distal phalanx of the great toe. 2. Transverse band of low T1 and high T2 signal in the proximal metaphysis of the proximal phalanx of the  small toe, favoring fracture over infection. 3. Nonspecific subcutaneous edema along the dorsum of the foot tracking into the toes, consistent with cellulitis. No drainable abscess identified. 4. Mild degenerative changes in the head of the first metatarsal. 5. 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 Electronically signed by: Ryan Salvage MD 06/23/2024 05:00 PM EST RP Workstation: [id number]   DG Chest 2 View Result Date: 06/23/2024 EXAM: 2 VIEW(S) XRAY OF THE CHEST 06/23/2024 01:00:00 PM COMPARISON: 09/13/2022 CLINICAL HISTORY: Infection FINDINGS: LUNGS AND PLEURA: No focal pulmonary opacity. No pulmonary edema. No pleural effusion. No pneumothorax. HEART AND MEDIASTINUM: No acute abnormality of the cardiac and mediastinal silhouettes. BONES AND SOFT TISSUES: Thoracic degenerative spurring. No acute osseous abnormality. IMPRESSION: 1. No acute cardiopulmonary process. 2. Thoracic degenerative spurring is present. Electronically signed by: Ryan Salvage MD 06/23/2024 01:45 PM EST RP Workstation: [id number]   DG Foot Complete Left Result Date: 06/22/2024 Please see detailed radiograph report in office note.    Subjective: - no chest pain, shortness of breath, no abdominal pain, nausea or vomiting.   Discharge Exam: BP (!) 172/74 (BP Location: Right Arm)   Pulse 62   Temp 98 F (36.7 C) (Oral)   Resp  16   Ht 5' 11 (1.803 m)   Wt 91.6 kg   SpO2 100%   BMI 28.17 kg/m   General: Pt is alert, awake, not in acute distress Cardiovascular: RRR, S1/S2 +, no rubs, no gallops Respiratory: CTA bilaterally, no wheezing, no rhonchi Abdominal: Soft, NT, ND, bowel sounds + Extremities: no edema, no cyanosis    The results of significant diagnostics from this hospitalization (including imaging, microbiology, ancillary and laboratory) are listed below for reference.     Microbiology: Recent Results (from the past 240 hours)  Resp panel by RT-PCR (RSV, Flu A&B,  Covid) Anterior Nasal Swab     Status: None   Collection Time: 06/23/24 12:47 PM   Specimen: Anterior Nasal Swab  Result Value Ref Range Status   SARS Coronavirus 2 by RT PCR NEGATIVE NEGATIVE Final   Influenza A by PCR NEGATIVE NEGATIVE Final   Influenza B by PCR NEGATIVE NEGATIVE Final    Comment: (NOTE) The Xpert Xpress SARS-CoV-2/FLU/RSV plus assay is intended as an aid in the diagnosis of influenza from Nasopharyngeal swab specimens and should not be used as a sole basis for treatment. Nasal washings and aspirates are unacceptable for Xpert Xpress SARS-CoV-2/FLU/RSV testing.  Fact Sheet for Patients: bloggercourse.com  Fact Sheet for Healthcare Providers: seriousbroker.it  This test is not yet approved or cleared by the [place]  FDA and has been authorized for detection and/or diagnosis of SARS-CoV-2 by FDA under an Emergency Use Authorization (EUA). This EUA will remain in effect (meaning this test can be used) for the duration of the COVID-19 declaration under Section 564(b)(1) of the Act, 21 U.S.C. section 360bbb-3(b)(1), unless the authorization is terminated or revoked.     Resp Syncytial Virus by PCR NEGATIVE NEGATIVE Final    Comment: (NOTE) Fact Sheet for Patients: bloggercourse.com  Fact Sheet for Healthcare Providers: seriousbroker.it  This test is not yet approved or cleared by the [place]  FDA and has been authorized for detection and/or diagnosis of SARS-CoV-2 by FDA under an Emergency Use Authorization (EUA). This EUA will remain in effect (meaning this test can be used) for the duration of the COVID-19 declaration under Section 564(b)(1) of the Act, 21 U.S.C. section 360bbb-3(b)(1), unless the authorization is terminated or revoked.  Performed at [facility], [address], [place], [place] [address]   MRSA Next Gen by PCR, Nasal      Status: None   Collection Time: 06/23/24  8:26 PM   Specimen: Nasal Mucosa; Nasal Swab  Result Value Ref Range Status   MRSA by PCR Next Gen NOT DETECTED NOT DETECTED Final    Comment: (NOTE) The GeneXpert MRSA Assay (FDA approved for NASAL specimens only), is one component of a comprehensive MRSA colonization surveillance program. It is not intended to diagnose MRSA infection nor to guide or monitor treatment for MRSA infections. Test performance is not FDA approved in patients less than 31 years old. Performed at [facility], [address], [place], [place] [address]   Aerobic/Anaerobic Culture w Gram Stain (surgical/deep wound)     Status: None (Preliminary result)   Collection Time: 06/25/24 12:05 PM   Specimen: Foot, Left; Tissue  Result Value Ref Range Status   Specimen Description TISSUE  Final   Special Requests left great toe  Final   Gram Stain   Final    NO WBC SEEN RARE GRAM POSITIVE COCCI Performed at [facility], [address], [place], [place] [address]  Culture   Final    RARE ENTEROBACTER CLOACAE RARE STAPHYLOCOCCUS LUGDUNENSIS NO ANAEROBES ISOLATED; CULTURE IN PROGRESS FOR 5 DAYS    Report Status PENDING  Incomplete   Organism ID, Bacteria ENTEROBACTER CLOACAE  Final   Organism ID, Bacteria STAPHYLOCOCCUS LUGDUNENSIS  Final      Susceptibility   Enterobacter cloacae - MIC*    CEFEPIME <=0.12 SENSITIVE Sensitive     ERTAPENEM <=0.12 SENSITIVE Sensitive     CIPROFLOXACIN <=0.06 SENSITIVE Sensitive     GENTAMICIN <=1 SENSITIVE Sensitive     MEROPENEM <=0.25 SENSITIVE Sensitive     TRIMETH/SULFA <=20 SENSITIVE Sensitive     PIP/TAZO Value in next row Sensitive      <=4 SENSITIVEThis is a modified FDA-approved test that has been validated and its performance characteristics determined by the reporting laboratory.  This laboratory is certified under the Clinical Laboratory Improvement Amendments CLIA as qualified to perform high complexity  clinical laboratory testing.    * RARE ENTEROBACTER CLOACAE   Staphylococcus lugdunensis - MIC*    CIPROFLOXACIN Value in next row Sensitive      <=4 SENSITIVEThis is a modified FDA-approved test that has been validated and its performance characteristics determined by the reporting laboratory.  This laboratory is certified under the Clinical Laboratory Improvement Amendments CLIA as qualified to perform high complexity clinical laboratory testing.    ERYTHROMYCIN Value in next row Sensitive      <=4 SENSITIVEThis is a modified FDA-approved test that has been validated and its performance characteristics determined by the reporting laboratory.  This laboratory is certified under the Clinical Laboratory Improvement Amendments CLIA as qualified to perform high complexity clinical laboratory testing.    GENTAMICIN Value in next row Sensitive      <=4 SENSITIVEThis is a modified FDA-approved test that has been validated and its performance characteristics determined by the reporting laboratory.  This laboratory is certified under the Clinical Laboratory Improvement Amendments CLIA as qualified to perform high complexity clinical laboratory testing.    OXACILLIN Value in next row Sensitive      <=4 SENSITIVEThis is a modified FDA-approved test that has been validated and its performance characteristics determined by the reporting laboratory.  This laboratory is certified under the Clinical Laboratory Improvement Amendments CLIA as qualified to perform high complexity clinical laboratory testing.    TETRACYCLINE Value in next row Sensitive      <=4 SENSITIVEThis is a modified FDA-approved test that has been validated and its performance characteristics determined by the reporting laboratory.  This laboratory is certified under the Clinical Laboratory Improvement Amendments CLIA as qualified to perform high complexity clinical laboratory testing.    VANCOMYCIN  Value in next row Sensitive      <=4  SENSITIVEThis is a modified FDA-approved test that has been validated and its performance characteristics determined by the reporting laboratory.  This laboratory is certified under the Clinical Laboratory Improvement Amendments CLIA as qualified to perform high complexity clinical laboratory testing.    TRIMETH/SULFA Value in next row Sensitive      <=4 SENSITIVEThis is a modified FDA-approved test that has been validated and its performance characteristics determined by the reporting laboratory.  This laboratory is certified under the Clinical Laboratory Improvement Amendments CLIA as qualified to perform high complexity clinical laboratory testing.    CLINDAMYCIN Value in next row Sensitive      <=4 SENSITIVEThis is a modified FDA-approved test that has been validated and its performance characteristics determined by the reporting laboratory.  This laboratory  is certified under the Clinical Laboratory Improvement Amendments CLIA as qualified to perform high complexity clinical laboratory testing.    RIFAMPIN Value in next row Sensitive      <=4 SENSITIVEThis is a modified FDA-approved test that has been validated and its performance characteristics determined by the reporting laboratory.  This laboratory is certified under the Clinical Laboratory Improvement Amendments CLIA as qualified to perform high complexity clinical laboratory testing.    Inducible Clindamycin Value in next row Sensitive      <=4 SENSITIVEThis is a modified FDA-approved test that has been validated and its performance characteristics determined by the reporting laboratory.  This laboratory is certified under the Clinical Laboratory Improvement Amendments CLIA as qualified to perform high complexity clinical laboratory testing.    * RARE STAPHYLOCOCCUS LUGDUNENSIS  Culture, blood (routine x 2)     Status: None (Preliminary result)   Collection Time: 06/27/24  4:59 PM   Specimen: BLOOD  Result Value Ref Range Status   Specimen  Description BLOOD RIGHT ANTECUBITAL  Final   Special Requests   Final    BOTTLES DRAWN AEROBIC AND ANAEROBIC Blood Culture results may not be optimal due to an inadequate volume of blood received in culture bottles   Culture   Final    NO GROWTH < 12 HOURS Performed at [facility], [address] [place], [place] [address]    Report Status PENDING  Incomplete  Culture, blood (routine x 2)     Status: None (Preliminary result)   Collection Time: 06/27/24  5:04 PM   Specimen: BLOOD  Result Value Ref Range Status   Specimen Description BLOOD BLOOD RIGHT WRIST  Final   Special Requests   Final    BOTTLES DRAWN AEROBIC AND ANAEROBIC Blood Culture results may not be optimal due to an inadequate volume of blood received in culture bottles   Culture   Final    NO GROWTH < 12 HOURS Performed at [facility], [address], [place], [place] [address]    Report Status PENDING  Incomplete     Labs: Basic Metabolic Panel: Recent Labs  Lab 06/23/24 1241 06/23/24 2353 06/24/24 0154 06/27/24 1641 06/27/24 1649 06/28/24 0505  NA 139  --  137 141 141 139  K 4.2  --  3.8 3.8 3.8 3.8  CL 103  --  105 108 108 106  CO2 22  --  23 21*  --  22  GLUCOSE 173*  --  219* 146* 143* 209*  BUN 20  --  17 28* 30* 24*  CREATININE 1.17  --  0.99 1.34* 1.50* 1.10  CALCIUM  9.2  --  8.6* 8.9  --  8.5*  MG  --  2.0 1.9  --   --   --   PHOS  --  2.9 3.0  --   --   --    Liver Function Tests: Recent Labs  Lab 06/23/24 1241 06/24/24 0154 06/27/24 1641  AST 14* 15 22  ALT 10 9 15   ALKPHOS 87 71 72  BILITOT 0.8 0.7 0.6  PROT 7.5 6.5 6.9  ALBUMIN 3.5 2.9* 3.2*   CBC: Recent Labs  Lab 06/23/24 1241 06/24/24 0154 06/27/24 1641 06/27/24 1649 06/28/24 0505  WBC 7.2 6.5 6.2  --  6.2  NEUTROABS 3.1  --  3.3  --   --   HGB 12.9* 12.2* 12.3* 12.6* 11.3*  HCT 40.4 37.2* 37.9* 37.0* 33.5*  MCV 99.3 96.9 99.7  --  96.3  PLT 348 297 346  --  327   CBG: Recent Labs  Lab 06/26/24 0905  06/27/24 2052 06/28/24 0804 06/28/24 1133 06/28/24 1611  GLUCAP 184* 134* 151* 205* 144*   Hgb A1c No results for input(s): HGBA1C in the last 72 hours. Lipid Profile No results for input(s): CHOL, HDL, LDLCALC, TRIG, CHOLHDL, LDLDIRECT in the last 72 hours. Thyroid  function studies No results for input(s): TSH, T4TOTAL, T3FREE, THYROIDAB in the last 72 hours.  Invalid input(s): FREET3 Urinalysis    Component Value Date/Time   COLORURINE YELLOW 06/27/2024 1855   APPEARANCEUR CLEAR 06/27/2024 1855   LABSPEC 1.021 06/27/2024 1855   PHURINE 6.0 06/27/2024 1855   GLUCOSEU >=500 (A) 06/27/2024 1855   HGBUR NEGATIVE 06/27/2024 1855   BILIRUBINUR NEGATIVE 06/27/2024 1855   KETONESUR NEGATIVE 06/27/2024 1855   PROTEINUR NEGATIVE 06/27/2024 1855   NITRITE NEGATIVE 06/27/2024 1855   LEUKOCYTESUR NEGATIVE 06/27/2024 1855    FURTHER DISCHARGE INSTRUCTIONS:   Get Medicines reviewed and adjusted: Please take all your medications with you for your next visit with your Primary MD   Laboratory/radiological data: Please request your Primary MD to go over all hospital tests and procedure/radiological results at the follow up, please ask your Primary MD to get all Hospital records sent to his/her office.   In some cases, they will be blood work, cultures and biopsy results pending at the time of your discharge. Please request that your primary care M.D. goes through all the records of your hospital data and follows up on these results.   Also Note the following: If you experience worsening of your admission symptoms, develop shortness of breath, life threatening emergency, suicidal or homicidal thoughts you must seek medical attention immediately by calling 911 or calling your MD immediately  if symptoms less severe.   You must read complete instructions/literature along with all the possible adverse reactions/side effects for all the Medicines you take and that have  been prescribed to you. Take any new Medicines after you have completely understood and accpet all the possible adverse reactions/side effects.    Do not drive when taking Pain medications or sleeping medications (Benzodaizepines)   Do not take more than prescribed Pain, Sleep and Anxiety Medications. It is not advisable to combine anxiety,sleep and pain medications without talking with your primary care practitioner   Special Instructions: If you have smoked or chewed Tobacco  in the last 2 yrs please stop smoking, stop any regular Alcohol   and or any Recreational drug use.   Wear Seat belts while driving.   Please note: You were cared for by a hospitalist during your hospital stay. Once you are discharged, your primary care physician will handle any further medical issues. Please note that NO REFILLS for any discharge medications will be authorized once you are discharged, as it is imperative that you return to your primary care physician (or establish a relationship with a primary care physician if you do not have one) for your post hospital discharge needs so that they can reassess your need for medications and monitor your lab values.  Time coordinating discharge: 35 minutes  SIGNED:  Nilda Fendt, MD, PhD 06/28/2024, 5:01 PM

## 2024-06-28 NOTE — Care Management Obs Status (Signed)
 MEDICARE OBSERVATION STATUS NOTIFICATION   Patient Details  Name: Timothy Peters MRN: [id number] Date of Birth: 11/29/1950   Medicare Observation Status Notification Given:  Yes Verbally reviewed observation notice with Olam Gilmore telephonically at [phone].  Patient wife ask that we mail the notice to the home address.       Mada Sadik 06/28/2024, 12:24 PM

## 2024-06-28 NOTE — Discharge Instructions (Signed)
 Follow with [name], [name], MD in 5-7 days  Please get a complete blood count and chemistry panel checked by your Primary MD at your next visit, and again as instructed by your Primary MD. Please get your medications reviewed and adjusted by your Primary MD.  Please request your Primary MD to go over all Hospital Tests and Procedure/Radiological results at the follow up, please get all Hospital records sent to your Prim MD by signing hospital release before you go home.  In some cases, there will be blood work, cultures and biopsy results pending at the time of your discharge. Please request that your primary care M.D. goes through all the records of your hospital data and follows up on these results.  If you had Pneumonia of Lung problems at the Hospital: Please get a 2 view Chest X ray done in 6-8 weeks after hospital discharge or sooner if instructed by your Primary MD.  If you have Congestive Heart Failure: Please call your Cardiologist or Primary MD anytime you have any of the following symptoms:  1) 3 pound weight gain in 24 hours or 5 pounds in 1 week  2) shortness of breath, with or without a dry hacking cough  3) swelling in the hands, feet or stomach  4) if you have to sleep on extra pillows at night in order to breathe  Follow cardiac low salt diet and 1.5 lit/day fluid restriction.  If you have diabetes Accuchecks 4 times/day, Once in AM empty stomach and then before each meal. Log in all results and show them to your primary doctor at your next visit. If any glucose reading is under 80 or above 300 call your primary MD immediately.  If you have Seizure/Convulsions/Epilepsy: Please do not drive, operate heavy machinery, participate in activities at heights or participate in high speed sports until you have seen by Primary MD or a Neurologist and advised to do so again. Per [place] statutes, patients with seizures are not allowed to drive until they have been  seizure-free for six months.  Use caution when using heavy equipment or power tools. Avoid working on ladders or at heights. Take showers instead of baths. Ensure the water temperature is not too high on the home water heater. Do not go swimming alone. Do not lock yourself in a room alone (i.e. bathroom). When caring for infants or small children, sit down when holding, feeding, or changing them to minimize risk of injury to the child in the event you have a seizure. Maintain good sleep hygiene. Avoid alcohol .   If you had Gastrointestinal Bleeding: Please ask your Primary MD to check a complete blood count within one week of discharge or at your next visit. Your endoscopic/colonoscopic biopsies that are pending at the time of discharge, will also need to followed by your Primary MD.  Get Medicines reviewed and adjusted. Please take all your medications with you for your next visit with your Primary MD  Please request your Primary MD to go over all hospital tests and procedure/radiological results at the follow up, please ask your Primary MD to get all Hospital records sent to his/her office.  If you experience worsening of your admission symptoms, develop shortness of breath, life threatening emergency, suicidal or homicidal thoughts you must seek medical attention immediately by calling 911 or calling your MD immediately  if symptoms less severe.  You must read complete instructions/literature along with all the possible adverse reactions/side effects for all the Medicines you take  and that have been prescribed to you. Take any new Medicines after you have completely understood and accpet all the possible adverse reactions/side effects.   Do not drive or operate heavy machinery when taking Pain medications.   Do not take more than prescribed Pain, Sleep and Anxiety Medications  Special Instructions: If you have smoked or chewed Tobacco  in the last 2 yrs please stop smoking, stop any regular  Alcohol   and or any Recreational drug use.  Wear Seat belts while driving.  Please note You were cared for by a hospitalist during your hospital stay. If you have any questions about your discharge medications or the care you received while you were in the hospital after you are discharged, you can call the unit and asked to speak with the hospitalist on call if the hospitalist that took care of you is not available. Once you are discharged, your primary care physician will handle any further medical issues. Please note that NO REFILLS for any discharge medications will be authorized once you are discharged, as it is imperative that you return to your primary care physician (or establish a relationship with a primary care physician if you do not have one) for your aftercare needs so that they can reassess your need for medications and monitor your lab values.  You can reach the hospitalist office at phone [phone] or fax [phone]   If you do not have a primary care physician, you can call [phone] for a physician referral.  Activity: As tolerated with Full fall precautions use walker/cane & assistance as needed    Diet: heart healthy  Disposition Home

## 2024-06-29 ENCOUNTER — Other Ambulatory Visit (HOSPITAL_COMMUNITY): Payer: Self-pay

## 2024-06-29 LAB — SURGICAL PATHOLOGY

## 2024-06-30 ENCOUNTER — Encounter

## 2024-06-30 ENCOUNTER — Ambulatory Visit

## 2024-06-30 ENCOUNTER — Other Ambulatory Visit (HOSPITAL_COMMUNITY): Payer: Self-pay

## 2024-06-30 DIAGNOSIS — Z9889 Other specified postprocedural states: Secondary | ICD-10-CM

## 2024-06-30 DIAGNOSIS — E1149 Type 2 diabetes mellitus with other diabetic neurological complication: Secondary | ICD-10-CM

## 2024-06-30 DIAGNOSIS — M86172 Other acute osteomyelitis, left ankle and foot: Secondary | ICD-10-CM | POA: Diagnosis not present

## 2024-06-30 DIAGNOSIS — E1140 Type 2 diabetes mellitus with diabetic neuropathy, unspecified: Secondary | ICD-10-CM

## 2024-06-30 LAB — AEROBIC/ANAEROBIC CULTURE W GRAM STAIN (SURGICAL/DEEP WOUND): Gram Stain: NONE SEEN

## 2024-06-30 MED ORDER — HYDROCODONE-ACETAMINOPHEN 5-325 MG PO TABS
1.0000 | ORAL_TABLET | ORAL | 0 refills | Status: DC | PRN
  Filled 2024-06-30: qty 14, 2d supply, fill #0
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
## 2024-07-02 ENCOUNTER — Other Ambulatory Visit (HOSPITAL_COMMUNITY): Payer: Self-pay

## 2024-07-02 LAB — CULTURE, BLOOD (ROUTINE X 2)
Culture: NO GROWTH
Culture: NO GROWTH

## 2024-07-13 ENCOUNTER — Ambulatory Visit (INDEPENDENT_AMBULATORY_CARE_PROVIDER_SITE_OTHER)

## 2024-07-13 DIAGNOSIS — M86172 Other acute osteomyelitis, left ankle and foot: Secondary | ICD-10-CM

## 2024-07-13 DIAGNOSIS — Z9889 Other specified postprocedural states: Secondary | ICD-10-CM
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
## 2024-07-20 ENCOUNTER — Encounter (HOSPITAL_COMMUNITY): Payer: Self-pay | Admitting: Emergency Medicine

## 2024-07-20 ENCOUNTER — Emergency Department (HOSPITAL_COMMUNITY)

## 2024-07-20 ENCOUNTER — Emergency Department (HOSPITAL_COMMUNITY)
Admission: EM | Admit: 2024-07-20 | Discharge: 2024-07-20 | Disposition: A | Discharge status: Home / Self Care | Attending: Emergency Medicine | Admitting: Emergency Medicine

## 2024-07-20 DIAGNOSIS — Z7902 Long term (current) use of antithrombotics/antiplatelets: Secondary | ICD-10-CM | POA: Insufficient documentation

## 2024-07-20 DIAGNOSIS — Z794 Long term (current) use of insulin: Secondary | ICD-10-CM | POA: Insufficient documentation

## 2024-07-20 DIAGNOSIS — Z7982 Long term (current) use of aspirin: Secondary | ICD-10-CM | POA: Insufficient documentation

## 2024-07-20 DIAGNOSIS — S0990XA Unspecified injury of head, initial encounter: Secondary | ICD-10-CM | POA: Diagnosis present

## 2024-07-20 DIAGNOSIS — W01198A Fall on same level from slipping, tripping and stumbling with subsequent striking against other object, initial encounter: Secondary | ICD-10-CM | POA: Diagnosis not present

## 2024-07-20 DIAGNOSIS — Y9301 Activity, walking, marching and hiking: Secondary | ICD-10-CM | POA: Diagnosis not present

## 2024-07-20 DIAGNOSIS — S0101XA Laceration without foreign body of scalp, initial encounter: Secondary | ICD-10-CM | POA: Diagnosis not present

## 2024-07-20 MED ORDER — HYDROCODONE-ACETAMINOPHEN 5-325 MG PO TABS
1.0000 | ORAL_TABLET | Freq: Once | ORAL | Status: AC
  Administered 2024-07-20: 1 via ORAL
  Filled 2024-07-20: qty 1

## 2024-07-20 MED ORDER — LIDOCAINE-EPINEPHRINE-TETRACAINE (LET) TOPICAL GEL
3.0000 mL | Freq: Once | TOPICAL | Status: AC
  Administered 2024-07-20: 3 mL via TOPICAL
  Filled 2024-07-20: qty 3

## 2024-07-20 NOTE — ED Triage Notes (Signed)
 Pt arriving POV after a fall at home. Pt hit the back of his head. Small lac on left posterior side. Pt is on blood thinners. Pt A&O x4. Bleeding currently controlled.

## 2024-07-20 NOTE — Discharge Instructions (Signed)
 Make an appointment to have follow-up with your primary care doctor.  The staples need to be removed in 7 to 10 days.  Return to the emergency room if you have any worsening symptoms such as worsening headache, vomiting, confusion, difficulty with your balance, drainage or signs of infection from the wound or any other worsening symptoms.

## 2024-07-20 NOTE — ED Provider Notes (Addendum)
 [facility] AT [facility] Provider Note   CSN: [id number] Arrival date & time: 07/20/24  1429     Patient presents with: Head Laceration and Fall   Timothy Peters is a 73 y.o. male.   Patient is a 73 year old male who presents after a fall.  He says that he was walking to open the door for his wife and had he did not put his shoes on.  He slipped on his socks and fell backward, hitting his head on the leg of a table.  He has a laceration to his scalp area.  He is on Plavix .  He denies any other injuries.  No loss of consciousness.  No worsening headache.  No nausea or vomiting.  No neck or back pain.  He states his tetanus shot is up-to-date.       Prior to Admission medications   Medication Sig Start Date End Date Taking? Authorizing Provider  Accu-Chek Softclix Lancets lancets Test up to 4 times a day as directed 07/09/22   Jillian Buttery, MD  aspirin  81 MG tablet Take 1 tablet (81 mg total) by mouth daily. Resume 4 days post-op Patient taking differently: Take 81 mg by mouth daily. 10/30/17   Bissell, Jaclyn M, PA-C  atorvastatin  (LIPITOR ) 80 MG tablet Take 1 tablet (80 mg total) by mouth daily. 06/16/24     blood glucose meter kit and supplies Use as directed up to 4 times daily 07/09/22   Jillian Buttery, MD  clopidogrel  (PLAVIX ) 75 MG tablet Take 1 tablet (75 mg total) by mouth daily. 05/11/24     Continuous Glucose Sensor (DEXCOM G7 SENSOR) MISC [place]  as directed to continuously monitor blood glucose, replacing sensor every 10 days 06/30/23     dapagliflozin  propanediol (FARXIGA ) 10 MG TABS tablet Take 1 tablet (10 mg total) by mouth daily. 06/26/24   Dibia, Landon BRAVO, MD  DULoxetine  (CYMBALTA ) 60 MG capsule Take 1 capsule (60 mg total) by mouth daily. 01/21/24     Ferrous Sulfate  (IRON PO) Take 1 tablet by mouth daily.    [provider]  gabapentin  (NEURONTIN ) 300 MG capsule Take 1 capsule (300 mg total) by mouth 4 (four) times daily.  06/28/24     glipiZIDE  (GLUCOTROL ) 5 MG tablet Take 1 tablet (5 mg total) by mouth daily if blood sugar is >150 mg/dL. Patient taking differently: Take 5 mg by mouth See admin instructions. If sugar over 150 09/26/23     HYDROcodone -acetaminophen  (NORCO/VICODIN) 5-325 MG tablet Take 1-2 tablets by mouth every 4 (four) hours as needed for moderate pain (pain score 4-6). 06/26/24   Dibia, Pauline E, MD  HYDROcodone -acetaminophen  (NORCO/VICODIN) 5-325 MG tablet Take 1-2 tablets by mouth every 4 (four) hours as needed for moderate pain (pain score 4-6). 06/26/24   Dibia, Landon BRAVO, MD  ibuprofen  (ADVIL ) 200 MG tablet Take 400 mg by mouth daily as needed for moderate pain (pain score 4-6).    [provider]  insulin  degludec (TRESIBA  FLEXTOUCH) 100 UNIT/ML FlexTouch Pen Inject 28 Units into the skin daily. 09/26/23     Insulin  Pen Needle (NOVOFINE PEN NEEDLE) 32G X 6 MM MISC Use as directed 12/23/22     Insulin  Pen Needle 31G X 5 MM MISC Use with lantus  daily. 08/06/21   [name], [name], MD  losartan  (COZAAR ) 100 MG tablet Take 1 tablet (100 mg total) by mouth daily. 09/26/23     metFORMIN  (GLUCOPHAGE ) 1000 MG tablet Take 1 tablet (1,000 mg total)  by mouth 2 (two) times daily with a meal. 09/26/23     metoprolol  succinate (TOPROL -XL) 25 MG 24 hr tablet Take 1 tablet (25 mg total) by mouth daily. 06/26/24   Dibia, Landon BRAVO, MD    Allergies: Patient has no known allergies.    Review of Systems  Constitutional:  Negative for fatigue and fever.  HENT:  Negative for nosebleeds.   Respiratory:  Negative for shortness of breath.   Cardiovascular:  Negative for chest pain.  Gastrointestinal:  Negative for abdominal pain, nausea and vomiting.  Musculoskeletal:  Negative for arthralgias, back pain and neck pain.  Skin:  Positive for wound.  Neurological:  Negative for headaches.  Psychiatric/Behavioral:  Negative for confusion.     Updated Vital Signs BP (!) 112/54 (BP Location: Right Arm)   Pulse 77    Temp 97.6 F (36.4 C) (Oral)   Resp 16   SpO2 99%   Physical Exam Constitutional:      Appearance: He is well-developed.  HENT:     Head: Normocephalic.     Comments: 3 cm laceration to the crown of the head, no active bleeding Eyes:     Pupils: Pupils are equal, round, and reactive to light.  Neck:     Comments: No pain along the cervical, thoracic or lumbosacral spine Cardiovascular:     Rate and Rhythm: Normal rate and regular rhythm.     Heart sounds: Normal heart sounds.  Pulmonary:     Effort: Pulmonary effort is normal. No respiratory distress.     Breath sounds: Normal breath sounds. No wheezing or rales.  Chest:     Chest wall: No tenderness.  Abdominal:     General: Bowel sounds are normal.     Palpations: Abdomen is soft.     Tenderness: There is no abdominal tenderness. There is no guarding or rebound.  Musculoskeletal:        General: Normal range of motion.     Comments: No pain on palpation or range of motion of the extremities  Lymphadenopathy:     Cervical: No cervical adenopathy.  Skin:    General: Skin is warm and dry.     Findings: No rash.  Neurological:     General: No focal deficit present.     Mental Status: He is alert and oriented to person, place, and time.     (all labs ordered are listed, but only abnormal results are displayed) Labs Reviewed - No data to display  EKG: None  Radiology: CT Head Wo Contrast Result Date: 07/20/2024 EXAM: CT HEAD WITHOUT CONTRAST 07/20/2024 03:48:25 PM TECHNIQUE: CT of the head was performed without the administration of intravenous contrast. Automated exposure control, iterative reconstruction, and/or weight based adjustment of the mA/kV was utilized to reduce the radiation dose to as low as reasonably achievable. COMPARISON: Head CT 06/27/2024. CLINICAL HISTORY: Head trauma, minor (Age >= 65y). FINDINGS: BRAIN AND VENTRICLES: No acute hemorrhage. No evidence of acute infarct. No hydrocephalus. No extra-axial  collection. No mass effect or midline shift. ORBITS: No acute abnormality. SINUSES: No acute abnormality. SOFT TISSUES AND SKULL: No acute soft tissue abnormality. No skull fracture. IMPRESSION: 1. No acute intracranial abnormality or traumatic injury detected. Electronically signed by: Toribio Agreste MD 07/20/2024 03:58 PM EST RP Workstation: [id number]   CT Cervical Spine Wo Contrast Result Date: 07/20/2024 CLINICAL DATA:  Neck trauma. EXAM: CT CERVICAL SPINE WITHOUT CONTRAST TECHNIQUE: Multidetector CT imaging of the cervical spine was performed without intravenous contrast. Multiplanar  CT image reconstructions were also generated. RADIATION DOSE REDUCTION: This exam was performed according to the departmental dose-optimization program which includes automated exposure control, adjustment of the mA and/or kV according to patient size and/or use of iterative reconstruction technique. COMPARISON:  CT cervical spine 05/14/2024. FINDINGS: Alignment: Stable straightening and 4 mm of retrolisthesis at C3-4. Skull base and vertebrae: No evidence of acute cervical spine fracture or traumatic subluxation. Soft tissues and spinal canal: No prevertebral fluid or swelling. No visible canal hematoma. Disc levels: Advanced disc space narrowing with endplate osteophytes, uncinate spurring and possible partial interbody ankylosis at C3-4, as before. Resulting severe spinal stenosis with narrowing of the AP diameter of the canal to 6 mm. Moderate osseous foraminal narrowing, greater on the left. Chronic spondylosis at C5-6 with loss of disc height, endplate osteophytes and uncinate spurring contributing to moderate spinal stenosis and mild to moderate foraminal narrowing bilaterally. No large disc herniation identified. Upper chest: Clear lung apices. Other: Mild bilateral carotid atherosclerosis. IMPRESSION: 1. No evidence of acute cervical spine fracture, traumatic subluxation or static signs of instability. 2. Stable advanced  cervical spondylosis at C3-4 with resulting severe spinal stenosis and moderate osseous foraminal narrowing. 3. Stable chronic spondylosis at C5-6 with moderate spinal stenosis and mild to moderate foraminal narrowing bilaterally. Electronically Signed   By: Elsie Perone M.D.   On: 07/20/2024 15:57     .Laceration Repair  Date/Time: 07/20/2024 5:50 PM  Performed by: Lenor Hollering, MD Authorized by: Lenor Hollering, MD   Consent:    Consent obtained:  Verbal   Consent given by:  Patient   Risks, benefits, and alternatives were discussed: yes     Risks discussed:  Pain, infection and poor cosmetic result   Alternatives discussed:  No treatment Anesthesia:    Anesthesia method:  Topical application   Topical anesthetic:  LET Laceration details:    Location:  Scalp   Scalp location:  Crown   Length (cm):  3 Pre-procedure details:    Preparation:  Imaging obtained to evaluate for foreign bodies and patient was prepped and draped in usual sterile fashion Exploration:    Hemostasis achieved with:  LET   Imaging outcome: foreign body not noted     Wound exploration: entire depth of wound visualized     Wound extent: fascia not violated, no foreign body, no signs of injury, no underlying fracture and no vascular damage     Contaminated: no   Treatment:    Area cleansed with:  Saline   Amount of cleaning:  Standard   Irrigation solution:  Sterile saline   Irrigation method:  Syringe   Visualized foreign bodies/material removed: no     Debridement:  None Skin repair:    Repair method:  Staples   Number of staples:  4 Approximation:    Approximation:  Close Repair type:    Repair type:  Simple Post-procedure details:    Dressing:  Open (no dressing)    Medications Ordered in the ED  HYDROcodone -acetaminophen  (NORCO/VICODIN) 5-325 MG per tablet 1 tablet (has no administration in time range)  lidocaine -EPINEPHrine -tetracaine (LET) topical gel (3 mLs Topical Given 07/20/24 1711)                                     Medical Decision Making Amount and/or Complexity of Data Reviewed Radiology: ordered.  Risk Prescription drug management.   Patient is a 73 year old male who  presents after a mechanical fall.  He did not have any symptoms preceding the fall.  He has a laceration to his scalp area.  He denies any other injuries.  The wound was repaired with staples.  He had a CT scan of his head which shows no evidence of intracranial hemorrhage.  CT scan of the cervical spine shows no bony injury.  No foreign bodies were noted in the wound imaging.  As he was leaving, he was complaining of some pain in his buttocks area.  He had some tenderness in his sacral area on palpation.  I recommended x-ray imaging of the area but patient declines.  He says he thinks he just bruised and does not want any further evaluation.  Advised him to make an appointment to follow-up with his primary care doctor.  Wound care instructions were given, return precautions were given.  Head injury precautions were given.  He was advised to have the staples removed in 7 to 10 days.     Final diagnoses:  Injury of head, initial encounter  Laceration of scalp, initial encounter    ED Discharge Orders     None          Lenor Hollering, MD 07/20/24 1753    Lenor Hollering, MD 07/20/24 1753

## 2024-07-22 ENCOUNTER — Other Ambulatory Visit (HOSPITAL_COMMUNITY): Payer: Self-pay

## 2024-07-23 ENCOUNTER — Other Ambulatory Visit (HOSPITAL_COMMUNITY): Payer: Self-pay

## 2024-07-23 ENCOUNTER — Encounter: Payer: Self-pay | Admitting: Surgery

## 2024-07-23 ENCOUNTER — Ambulatory Visit: Admitting: Podiatry

## 2024-07-23 ENCOUNTER — Encounter: Payer: Self-pay | Admitting: Podiatry

## 2024-07-23 ENCOUNTER — Other Ambulatory Visit: Payer: Self-pay

## 2024-07-23 DIAGNOSIS — M79675 Pain in left toe(s): Secondary | ICD-10-CM | POA: Diagnosis not present

## 2024-07-23 DIAGNOSIS — E1140 Type 2 diabetes mellitus with diabetic neuropathy, unspecified: Secondary | ICD-10-CM | POA: Diagnosis not present

## 2024-07-23 DIAGNOSIS — B351 Tinea unguium: Secondary | ICD-10-CM | POA: Diagnosis not present

## 2024-07-23 DIAGNOSIS — M79674 Pain in right toe(s): Secondary | ICD-10-CM | POA: Diagnosis not present

## 2024-07-23 DIAGNOSIS — I999 Unspecified disorder of circulatory system: Secondary | ICD-10-CM

## 2024-07-23 DIAGNOSIS — Z89412 Acquired absence of left great toe: Secondary | ICD-10-CM

## 2024-07-23 DIAGNOSIS — L84 Corns and callosities: Secondary | ICD-10-CM

## 2024-07-23 DIAGNOSIS — E1149 Type 2 diabetes mellitus with other diabetic neurological complication: Secondary | ICD-10-CM

## 2024-07-23 DIAGNOSIS — Z89422 Acquired absence of other left toe(s): Secondary | ICD-10-CM

## 2024-07-23 MED ORDER — DEXCOM G7 SENSOR MISC
3 refills | Status: AC
  Filled 2024-07-23: qty 9, 90d supply, fill #0

## 2024-07-23 MED ORDER — DULOXETINE HCL 60 MG PO CPEP
60.0000 mg | ORAL_CAPSULE | Freq: Every day | ORAL | 1 refills | Status: AC
  Filled 2024-07-23: qty 90, 90d supply, fill #0
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
## 2024-08-06 ENCOUNTER — Ambulatory Visit (INDEPENDENT_AMBULATORY_CARE_PROVIDER_SITE_OTHER)

## 2024-08-06 ENCOUNTER — Ambulatory Visit

## 2024-08-06 DIAGNOSIS — Z89412 Acquired absence of left great toe: Secondary | ICD-10-CM

## 2024-08-06 DIAGNOSIS — Z9889 Other specified postprocedural states: Secondary | ICD-10-CM

## 2024-08-06 DIAGNOSIS — Z89422 Acquired absence of other left toe(s): Secondary | ICD-10-CM

## 2024-08-06 DIAGNOSIS — M869 Osteomyelitis, unspecified: Secondary | ICD-10-CM

## 2024-08-06 DIAGNOSIS — Z4781 Encounter for orthopedic aftercare following surgical amputation: Secondary | ICD-10-CM
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
## 2024-08-18 ENCOUNTER — Other Ambulatory Visit (HOSPITAL_COMMUNITY): Payer: Self-pay

## 2024-08-25 ENCOUNTER — Other Ambulatory Visit (HOSPITAL_COMMUNITY): Payer: Self-pay

## 2024-08-26 ENCOUNTER — Other Ambulatory Visit (HOSPITAL_COMMUNITY): Payer: Self-pay

## 2024-08-26 ENCOUNTER — Other Ambulatory Visit: Payer: Self-pay

## 2024-08-26 MED ORDER — PEG 3350-KCL-NA BICARB-NACL 420 G PO SOLR
ORAL | 0 refills | Status: DC
  Filled 2024-08-26: qty 4000, 2d supply, fill #0

## 2024-08-26 MED ORDER — DULCOLAX 5 MG PO TBEC
10.0000 mg | DELAYED_RELEASE_TABLET | Freq: Two times a day (BID) | ORAL | 0 refills | Status: DC
  Filled 2024-08-26: qty 4, 1d supply, fill #0

## 2024-08-28 ENCOUNTER — Other Ambulatory Visit (HOSPITAL_COMMUNITY): Payer: Self-pay

## 2024-08-28 MED ORDER — DULCOLAX 5 MG PO TBEC
10.0000 mg | DELAYED_RELEASE_TABLET | Freq: Two times a day (BID) | ORAL | 0 refills | Status: AC
  Filled 2024-08-28: qty 4, 1d supply, fill #0

## 2024-08-28 MED ORDER — PEG 3350-KCL-NA BICARB-NACL 420 G PO SOLR
ORAL | 0 refills | Status: AC
  Filled 2024-08-28: qty 4000, 2d supply, fill #0

## 2024-09-08 ENCOUNTER — Other Ambulatory Visit (HOSPITAL_COMMUNITY): Payer: Self-pay

## 2024-09-10 ENCOUNTER — Other Ambulatory Visit (HOSPITAL_COMMUNITY): Payer: Self-pay

## 2024-09-13 ENCOUNTER — Other Ambulatory Visit (HOSPITAL_COMMUNITY): Payer: Self-pay

## 2024-09-15 ENCOUNTER — Other Ambulatory Visit (HOSPITAL_COMMUNITY): Payer: Self-pay

## 2024-09-15 MED ORDER — HYDROCODONE-ACETAMINOPHEN 5-325 MG PO TABS
1.0000 | ORAL_TABLET | ORAL | 0 refills | Status: AC | PRN
  Filled 2024-09-15: qty 30, 5d supply, fill #0

## 2024-09-16 ENCOUNTER — Other Ambulatory Visit: Payer: Self-pay

## 2024-09-16 ENCOUNTER — Encounter: Payer: Self-pay | Admitting: Pharmacist

## 2024-09-17 ENCOUNTER — Other Ambulatory Visit (HOSPITAL_COMMUNITY): Payer: Self-pay

## 2024-09-20 ENCOUNTER — Other Ambulatory Visit: Payer: Self-pay

## 2024-09-20 ENCOUNTER — Other Ambulatory Visit (HOSPITAL_COMMUNITY): Payer: Self-pay

## 2024-10-21 ENCOUNTER — Ambulatory Visit: Admitting: Podiatry

## 2024-10-28 ENCOUNTER — Ambulatory Visit

## 2024-10-28 ENCOUNTER — Encounter (HOSPITAL_COMMUNITY)

## 2024-11-05 ENCOUNTER — Ambulatory Visit
